# Patient Record
Sex: Male | Born: 1945 | Race: White | Hispanic: No | Marital: Married | Smoking: Never smoker
Health system: Southern US, Community
[De-identification: ages and names within clinical notes are randomized; demographics above are authoritative.]

## PROBLEM LIST (undated history)

## (undated) DIAGNOSIS — M25569 Pain in unspecified knee: Secondary | ICD-10-CM

## (undated) DIAGNOSIS — K635 Polyp of colon: Secondary | ICD-10-CM

## (undated) DIAGNOSIS — C801 Malignant (primary) neoplasm, unspecified: Secondary | ICD-10-CM

## (undated) DIAGNOSIS — K589 Irritable bowel syndrome without diarrhea: Secondary | ICD-10-CM

## (undated) DIAGNOSIS — I4891 Unspecified atrial fibrillation: Secondary | ICD-10-CM

## (undated) DIAGNOSIS — I4510 Unspecified right bundle-branch block: Secondary | ICD-10-CM

## (undated) DIAGNOSIS — I2510 Atherosclerotic heart disease of native coronary artery without angina pectoris: Secondary | ICD-10-CM

## (undated) DIAGNOSIS — G4730 Sleep apnea, unspecified: Secondary | ICD-10-CM

## (undated) DIAGNOSIS — R55 Syncope and collapse: Secondary | ICD-10-CM

## (undated) DIAGNOSIS — Z7901 Long term (current) use of anticoagulants: Secondary | ICD-10-CM

## (undated) DIAGNOSIS — R002 Palpitations: Secondary | ICD-10-CM

## (undated) DIAGNOSIS — E785 Hyperlipidemia, unspecified: Secondary | ICD-10-CM

## (undated) DIAGNOSIS — Z86010 Personal history of colonic polyps: Secondary | ICD-10-CM

## (undated) DIAGNOSIS — R0989 Other specified symptoms and signs involving the circulatory and respiratory systems: Secondary | ICD-10-CM

## (undated) DIAGNOSIS — IMO0002 Reserved for concepts with insufficient information to code with codable children: Secondary | ICD-10-CM

## (undated) DIAGNOSIS — Z789 Other specified health status: Secondary | ICD-10-CM

## (undated) DIAGNOSIS — R9430 Abnormal result of cardiovascular function study, unspecified: Secondary | ICD-10-CM

## (undated) DIAGNOSIS — N400 Enlarged prostate without lower urinary tract symptoms: Secondary | ICD-10-CM

## (undated) DIAGNOSIS — U071 COVID-19: Secondary | ICD-10-CM

## (undated) DIAGNOSIS — F419 Anxiety disorder, unspecified: Secondary | ICD-10-CM

## (undated) HISTORY — DX: Other specified health status: Z78.9

## (undated) HISTORY — DX: Palpitations: R00.2

## (undated) HISTORY — DX: Unspecified right bundle-branch block: I45.10

## (undated) HISTORY — DX: Abnormal result of cardiovascular function study, unspecified: R94.30

## (undated) HISTORY — PX: CARDIAC CATHETERIZATION: SHX172

## (undated) HISTORY — DX: Benign prostatic hyperplasia without lower urinary tract symptoms: N40.0

## (undated) HISTORY — DX: Hyperlipidemia, unspecified: E78.5

## (undated) HISTORY — DX: Anxiety disorder, unspecified: F41.9

## (undated) HISTORY — DX: Irritable bowel syndrome, unspecified: K58.9

## (undated) HISTORY — DX: Pain in unspecified knee: M25.569

## (undated) HISTORY — DX: Other specified symptoms and signs involving the circulatory and respiratory systems: R09.89

## (undated) HISTORY — PX: COLONOSCOPY: SHX174

## (undated) HISTORY — DX: Syncope and collapse: R55

## (undated) HISTORY — PX: PROSTATE SURGERY: SHX751

## (undated) HISTORY — DX: COVID-19: U07.1

## (undated) HISTORY — DX: Unspecified atrial fibrillation: I48.91

## (undated) HISTORY — DX: Reserved for concepts with insufficient information to code with codable children: IMO0002

## (undated) HISTORY — DX: Personal history of colonic polyps: Z86.010

## (undated) HISTORY — DX: Atherosclerotic heart disease of native coronary artery without angina pectoris: I25.10

## (undated) HISTORY — DX: Polyp of colon: K63.5

---

## 1990-05-12 HISTORY — PX: BACK SURGERY: SHX140

## 2000-10-25 ENCOUNTER — Inpatient Hospital Stay (HOSPITAL_COMMUNITY): Admission: AD | Admit: 2000-10-25 | Discharge: 2000-10-26 | Payer: Self-pay | Admitting: Cardiology

## 2001-10-02 ENCOUNTER — Observation Stay (HOSPITAL_COMMUNITY): Admission: EM | Admit: 2001-10-02 | Discharge: 2001-10-03 | Payer: Self-pay

## 2002-01-13 ENCOUNTER — Inpatient Hospital Stay (HOSPITAL_COMMUNITY): Admission: EM | Admit: 2002-01-13 | Discharge: 2002-01-15 | Payer: Self-pay | Admitting: *Deleted

## 2002-01-13 ENCOUNTER — Encounter: Payer: Self-pay | Admitting: *Deleted

## 2003-03-29 ENCOUNTER — Encounter: Payer: Self-pay | Admitting: Emergency Medicine

## 2003-03-29 ENCOUNTER — Emergency Department (HOSPITAL_COMMUNITY): Admission: EM | Admit: 2003-03-29 | Discharge: 2003-03-29 | Payer: Self-pay | Admitting: Emergency Medicine

## 2003-11-03 ENCOUNTER — Encounter: Admission: RE | Admit: 2003-11-03 | Discharge: 2003-12-04 | Payer: Self-pay | Admitting: Family Medicine

## 2004-04-14 ENCOUNTER — Ambulatory Visit: Payer: Self-pay

## 2004-04-19 ENCOUNTER — Ambulatory Visit: Payer: Self-pay | Admitting: Cardiology

## 2004-04-29 ENCOUNTER — Ambulatory Visit: Payer: Self-pay | Admitting: Cardiology

## 2004-07-14 ENCOUNTER — Ambulatory Visit: Payer: Self-pay | Admitting: Cardiology

## 2004-07-25 ENCOUNTER — Ambulatory Visit (HOSPITAL_COMMUNITY): Admission: RE | Admit: 2004-07-25 | Discharge: 2004-07-25 | Payer: Self-pay | Admitting: Gastroenterology

## 2004-07-25 ENCOUNTER — Encounter (INDEPENDENT_AMBULATORY_CARE_PROVIDER_SITE_OTHER): Payer: Self-pay | Admitting: *Deleted

## 2004-08-01 ENCOUNTER — Ambulatory Visit: Payer: Self-pay | Admitting: *Deleted

## 2004-08-26 ENCOUNTER — Ambulatory Visit: Payer: Self-pay | Admitting: Cardiology

## 2004-10-07 ENCOUNTER — Ambulatory Visit: Payer: Self-pay | Admitting: Internal Medicine

## 2004-10-28 ENCOUNTER — Ambulatory Visit: Payer: Self-pay | Admitting: Cardiovascular Disease

## 2004-11-09 ENCOUNTER — Ambulatory Visit: Payer: Self-pay | Admitting: Cardiology

## 2004-11-24 ENCOUNTER — Ambulatory Visit: Payer: Self-pay

## 2004-12-23 ENCOUNTER — Ambulatory Visit: Payer: Self-pay | Admitting: Cardiology

## 2005-01-31 ENCOUNTER — Ambulatory Visit: Payer: Self-pay | Admitting: Cardiology

## 2005-05-26 ENCOUNTER — Ambulatory Visit: Payer: Self-pay | Admitting: Cardiology

## 2005-09-03 ENCOUNTER — Ambulatory Visit: Payer: Self-pay | Admitting: Cardiovascular Disease

## 2005-09-04 ENCOUNTER — Encounter: Payer: Self-pay | Admitting: Cardiology

## 2005-09-04 ENCOUNTER — Observation Stay (HOSPITAL_COMMUNITY): Admission: EM | Admit: 2005-09-04 | Discharge: 2005-09-05 | Payer: Self-pay | Admitting: Emergency Medicine

## 2005-09-07 ENCOUNTER — Ambulatory Visit: Payer: Self-pay

## 2005-10-11 ENCOUNTER — Ambulatory Visit: Payer: Self-pay | Admitting: Cardiology

## 2006-09-03 ENCOUNTER — Ambulatory Visit: Payer: Self-pay | Admitting: Cardiology

## 2006-10-29 ENCOUNTER — Ambulatory Visit: Payer: Self-pay | Admitting: Internal Medicine

## 2006-11-29 ENCOUNTER — Encounter: Payer: Self-pay | Admitting: Internal Medicine

## 2006-11-29 ENCOUNTER — Ambulatory Visit: Payer: Self-pay | Admitting: Internal Medicine

## 2006-11-29 DIAGNOSIS — Z860100 Personal history of colon polyps, unspecified: Secondary | ICD-10-CM | POA: Insufficient documentation

## 2006-11-29 HISTORY — DX: Personal history of colon polyps, unspecified: Z86.0100

## 2006-11-29 HISTORY — DX: Personal history of colonic polyps: Z86.010

## 2007-04-30 ENCOUNTER — Ambulatory Visit: Payer: Self-pay | Admitting: Cardiology

## 2007-10-28 ENCOUNTER — Emergency Department (HOSPITAL_COMMUNITY): Admission: EM | Admit: 2007-10-28 | Discharge: 2007-10-29 | Payer: Self-pay | Admitting: Emergency Medicine

## 2007-11-29 ENCOUNTER — Ambulatory Visit: Payer: Self-pay | Admitting: Cardiology

## 2007-12-04 ENCOUNTER — Ambulatory Visit: Payer: Self-pay

## 2008-11-13 ENCOUNTER — Encounter: Payer: Self-pay | Admitting: Cardiology

## 2008-12-09 ENCOUNTER — Encounter: Payer: Self-pay | Admitting: Cardiology

## 2009-02-06 DIAGNOSIS — N400 Enlarged prostate without lower urinary tract symptoms: Secondary | ICD-10-CM

## 2009-05-24 ENCOUNTER — Encounter: Payer: Self-pay | Admitting: Cardiology

## 2009-05-25 ENCOUNTER — Ambulatory Visit: Payer: Self-pay | Admitting: Cardiology

## 2009-05-31 ENCOUNTER — Encounter (INDEPENDENT_AMBULATORY_CARE_PROVIDER_SITE_OTHER): Payer: Self-pay | Admitting: *Deleted

## 2009-09-20 ENCOUNTER — Encounter (INDEPENDENT_AMBULATORY_CARE_PROVIDER_SITE_OTHER): Payer: Self-pay | Admitting: *Deleted

## 2009-11-02 ENCOUNTER — Encounter: Payer: Self-pay | Admitting: Cardiology

## 2010-03-23 ENCOUNTER — Ambulatory Visit: Payer: Self-pay | Admitting: Cardiology

## 2010-04-04 ENCOUNTER — Ambulatory Visit: Payer: Self-pay | Admitting: Cardiovascular Disease

## 2010-04-04 ENCOUNTER — Observation Stay (HOSPITAL_COMMUNITY): Admission: EM | Admit: 2010-04-04 | Discharge: 2010-04-05 | Payer: Self-pay | Admitting: Emergency Medicine

## 2010-04-05 ENCOUNTER — Encounter: Payer: Self-pay | Admitting: Cardiology

## 2010-04-18 ENCOUNTER — Ambulatory Visit: Payer: Self-pay

## 2010-04-18 ENCOUNTER — Encounter: Payer: Self-pay | Admitting: Cardiology

## 2010-04-26 ENCOUNTER — Encounter: Payer: Self-pay | Admitting: Cardiology

## 2010-04-27 ENCOUNTER — Ambulatory Visit: Payer: Self-pay | Admitting: Cardiology

## 2010-07-14 NOTE — Letter (Signed)
Summary: Appointment - Reminder 2  [employer], Main Office  [address]   [place], [place] [address]   Phone: [phone]  Fax: [phone]     September 20, 2009 MRN: [id number]   Jordan [name] [address] [place], [place]  [address]   Dear [name],  Our records indicate that it is time to schedule a follow-up appointment with Dr. Myrtis Ser. It is very important that we reach you to schedule this appointment. We look forward to participating in your health care needs. Please contact us at the number listed above at your earliest convenience to schedule your appointment.  If you are unable to make an appointment at this time, give [place] a call so we can update our records.     Sincerely,    Migdalia Dk [facility] Scheduling Team

## 2010-07-14 NOTE — Letter (Signed)
Summary: [facility]   [facility]   Imported By: Roderic Ovens 04/28/2010 12:19:03  _____________________________________________________________________  External Attachment:    Type:   Image     Comment:   External Document

## 2010-07-14 NOTE — Assessment & Plan Note (Signed)
Summary: dizziness/over due follow up/mt  Medications Added GLUCOSAMINE-CHONDROITIN   CAPS (GLUCOSAMINE-CHONDROIT-VIT C-MN) once daily DOXYCYCLINE HYCLATE 100 MG CAPS (DOXYCYCLINE HYCLATE) two times a day      Allergies Added:   Visit Type:  Follow-up Primary Provider:  Vernon Prey, MD  CC:  CAD.  History of Present Illness: The patient is seen for followup of coronary artery disease and atrial fibrillation.  I saw him last December, 2010.  He is doing very well.  Continues to have rare episodes of atrial fibrillation that can last for several hours.  These are infrequent.  Usually they occur if he is tired.  Sometimes they can occur at nighttime.  He does not appear to have classic sleep apnea.  He had a syncopal episode and we discussed at the time of his last visit.  It appeared to have been vasovagal related to some events at that time.  He is going about full activities.  Current Medications (verified): 1)  Diltiazem Hcl Cr 180 Mg Xr24h-Cap (Diltiazem Hcl) .Marland Kitchen.. 1 By Mouth Daily 2)  Proscar 5 Mg Tabs (Finasteride) .... Once Daily 3)  Niaspan 750 Mg Cr-Tabs (Niacin (Antihyperlipidemic)) .... 2 Tabs At Bedtime 4)  Lipitor 40 Mg Tabs (Atorvastatin Calcium) .... Take One Tablet By Mouth Daily. 5)  Warfarin Sodium 5 Mg Tabs (Warfarin Sodium) .... Use As Directed By Anticoagulation Clinic 6)  Multivitamins   Tabs (Multiple Vitamin) .... Once Daily 7)  Vitamin D 1000 Unit  Tabs (Cholecalciferol) .... Once Daily 8)  Aspirin 81 Mg Tbec (Aspirin) .... Take One Tablet By Mouth Daily 9)  Nitrostat 0.4 Mg Subl (Nitroglycerin) .Marland Kitchen.. 1 Tablet Under Tongue At Onset of Chest Pain; You May Repeat Every 5 Minutes For Up To 3 Doses. 10)  Diltiazem Hcl 60 Mg Tabs (Diltiazem Hcl) .Marland Kitchen.. 1 Tab As Directed/ As Needed 11)  Glucosamine-Chondroitin   Caps (Glucosamine-Chondroit-Vit C-Mn) .... Once Daily 12)  Doxycycline Hyclate 100 Mg Caps (Doxycycline Hyclate) .... Two Times A Day  Allergies (verified): 1)  !  Sulfa 2)  ! Levaquin  Past History:  Past Medical History: SUPRAVENTRICULAR TACHYCARDIA, HX OF (ICD-V12.59) PALPITATIONS, HX OF (ICD-V12.50) COLONIC POLYPS, HX OF (ICD-V12.72) HYPERLIPIDEMIA (ICD-272.4) ANXIETY, MILD (ICD-300.00) BACK PAIN (ICD-724.5) BENIGN PROSTATIC HYPERTROPHY, HX OF (ICD-V13.8) CAD... stent  mid LAD... 2002  /   nuclear.. June, 2009... no ischemia EF 65-70%... echo.... 2007 PAROXYSMAL ATRIAL FIBRILLATION (ICD-427.31) Coumadin therapy.... despite low CHADS2 score.., patient wants coumadin Beta blocker... mild intolerance IRBBB Syncope.... November, 2010 Question soft right carotid bruit..... October, 2011    Review of Systems       Patient denies fever, chills, headache, sweats, rash, change in vision, change in hearing, chest pain, cough, nausea vomiting, urinary symptoms.  All other systems are reviewed and are negative.  Vital Signs:  Patient profile:   65 year old male Height:      75 inches Weight:      211 pounds BMI:     26.47 Pulse rate:   65 / minute BP sitting:   106 / 68  (left arm) Cuff size:   regular  Vitals Entered By: Hardin Negus, RMA (March 23, 2010 12:15 PM)  Physical Exam  General:  patient is stable today. Head:  head is atraumatic. Eyes:  no xanthelasma. Neck:  noted jugular venous distention.  There is question of a soft right carotid bruit. Chest Wall:  no chest wall tenderness. Lungs:  lungs are clear.  Respiratory effort is nonlabored. Heart:  cardiac exam  reveals an S1-S2.  There are no clicks or significant murmurs. Abdomen:  abdomen is soft. Msk:  no musculoskeletal deformities. Extremities:  no peripheral edema. Skin:  patient has a spider bite on his left elbow.  This is being treated. Psych:  Patient is oriented to person time and place.  Affect is normal.   Impression & Recommendations:  Problem # 1:  * QUESTION CAROTID BRUIT There is questionable bruit.  The patient has known vascular disease.  I  reviewed the records carefully and I do not find a carotid Doppler in the past.  This will be arranged.  I'll be in touch with you with information.  Problem # 2:  SYNCOPE (ICD-780.2)  His updated medication list for this problem includes:    Diltiazem Hcl Cr 180 Mg Xr24h-cap (Diltiazem hcl) .Marland Kitchen... 1 by mouth daily    Warfarin Sodium 5 Mg Tabs (Warfarin sodium) ..... Use as directed by anticoagulation clinic    Aspirin 81 Mg Tbec (Aspirin) .Marland Kitchen... Take one tablet by mouth daily    Nitrostat 0.4 Mg Subl (Nitroglycerin) .Marland Kitchen... 1 tablet under tongue at onset of chest pain; you may repeat every 5 minutes for up to 3 doses.    Diltiazem Hcl 60 Mg Tabs (Diltiazem hcl) .Marland Kitchen... 1 tab as directed/ as needed There's been no recurrent syncope.  No further workup needed.  Problem # 3:  COUMADIN THERAPY (ICD-V58.61) The patient continues on Coumadin.  No change in therapy.  Problem # 4:  HYPERLIPIDEMIA (ICD-272.4)  His updated medication list for this problem includes:    Niaspan 750 Mg Cr-tabs (Niacin (antihyperlipidemic)) .Marland Kitchen... 2 tabs at bedtime    Lipitor 40 Mg Tabs (Atorvastatin calcium) .Marland Kitchen... Take one tablet by mouth daily. The patient's lipids are very well treated.  He brought me a copy of his last bloods.  He has excellent control.  Problem # 5:  CORONARY ARTERY DISEASE (ICD-414.00)  His updated medication list for this problem includes:    Diltiazem Hcl Cr 180 Mg Xr24h-cap (Diltiazem hcl) .Marland Kitchen... 1 by mouth daily    Warfarin Sodium 5 Mg Tabs (Warfarin sodium) ..... Use as directed by anticoagulation clinic    Aspirin 81 Mg Tbec (Aspirin) .Marland Kitchen... Take one tablet by mouth daily    Nitrostat 0.4 Mg Subl (Nitroglycerin) .Marland Kitchen... 1 tablet under tongue at onset of chest pain; you may repeat every 5 minutes for up to 3 doses.    Diltiazem Hcl 60 Mg Tabs (Diltiazem hcl) .Marland Kitchen... 1 tab as directed/ as needed Coronary disease is stable.  He had a nuclear stress study in 2009 with no ischemia.  Is not having  symptoms.  No further workup at this time.  Problem # 6:  PAROXYSMAL ATRIAL FIBRILLATION (ICD-427.31)  His updated medication list for this problem includes:    Warfarin Sodium 5 Mg Tabs (Warfarin sodium) ..... Use as directed by anticoagulation clinic    Aspirin 81 Mg Tbec (Aspirin) .Marland Kitchen... Take one tablet by mouth daily The patient continues to have frequent short bursts of atrial fibrillation that are symptomatic.  He takes extra short-acting diltiazem when he has one of these episodes.  This seems to help him.  The frequency is not changing.  By his choice he is on Coumadin.  No further workup at this time.  Other Orders: Carotid Duplex (Carotid Duplex)  Patient Instructions: 1)  Your physician wants you to follow-up in: 6 months.   You will receive a reminder letter in the mail two months in advance.  If you don't receive a letter, please call our office to schedule the follow-up appointment. 2)  Your physician has requested that you have a carotid duplex. This test is an ultrasound of the carotid arteries in your neck. It looks at blood flow through these arteries that supply the brain with blood. Allow one hour for this exam. There are no restrictions or special instructions. Prescriptions: DILTIAZEM HCL 60 MG TABS (DILTIAZEM HCL) 1 tab as directed/ as needed  #15 x 6   Entered by:   Meredith Staggers, RN   Authorized by:   Talitha Givens, MD, [facility]   Signed by:   Meredith Staggers, RN on 03/23/2010   Method used:   Faxed to ...       Hospital doctor (retail)       [address]       [place], [place]  [address]       Ph: [phone] or [phone]       Fax: [phone]   RxID:   [id number] NITROSTAT 0.4 MG SUBL (NITROGLYCERIN) 1 tablet under tongue at onset of chest pain; you may repeat every 5 minutes for up to 3 doses.  #25 x 11   Entered by:   Meredith Staggers, RN   Authorized by:   Talitha Givens, MD, [facility]   Signed by:   Meredith Staggers, RN on  03/23/2010   Method used:   Faxed to ...       Hospital doctor (retail)       [address]       [place], [place]  [address]       Ph: [phone] or [phone]       Fax: [phone]   RxID:   [id number] DILTIAZEM HCL CR 180 MG XR24H-CAP (DILTIAZEM HCL) 1 by mouth daily  #30 x 11   Entered by:   Meredith Staggers, RN   Authorized by:   Talitha Givens, MD, [facility]   Signed by:   Meredith Staggers, RN on 03/23/2010   Method used:   Faxed to ...       Hospital doctor (retail)       [address]       [place], [place]  [address]       Ph: [phone] or [phone]       Fax: [phone]   RxID:   [id number]

## 2010-07-14 NOTE — Miscellaneous (Signed)
  Clinical Lists Changes  Observations: Added new observation of PAST MED HX: SUPRAVENTRICULAR TACHYCARDIA, HX OF (ICD-V12.59) PALPITATIONS, HX OF (ICD-V12.50) COLONIC POLYPS, HX OF (ICD-V12.72) HYPERLIPIDEMIA (ICD-272.4) ANXIETY, MILD (ICD-300.00) BACK PAIN (ICD-724.5) BENIGN PROSTATIC HYPERTROPHY, HX OF (ICD-V13.8) CAD... stent  mid LAD... 2002  /   nuclear.. June, 2009... no ischemia  /   hospital 04/04/2010 nuclear...70%, no ischemia, possible slight apical scar, EF 65-70%... echo.... 2007 PAROXYSMAL ATRIAL FIBRILLATION (ICD-427.31) Coumadin therapy.... despite low CHADS2 score.., patient wants coumadin Beta blocker... mild intolerance IRBBB Syncope.... November, 2010 Question soft right carotid bruit..... October, 2011  /  doppler..04/18/2010  Normal   (04/26/2010 16:37) Added new observation of PRIMARY MD: Vernon Prey, MD (04/26/2010 16:37)       Past History:  Past Medical History: SUPRAVENTRICULAR TACHYCARDIA, HX OF (ICD-V12.59) PALPITATIONS, HX OF (ICD-V12.50) COLONIC POLYPS, HX OF (ICD-V12.72) HYPERLIPIDEMIA (ICD-272.4) ANXIETY, MILD (ICD-300.00) BACK PAIN (ICD-724.5) BENIGN PROSTATIC HYPERTROPHY, HX OF (ICD-V13.8) CAD... stent  mid LAD... 2002  /   nuclear.. June, 2009... no ischemia  /   hospital 04/04/2010 nuclear...70%, no ischemia, possible slight apical scar, EF 65-70%... echo.... 2007 PAROXYSMAL ATRIAL FIBRILLATION (ICD-427.31) Coumadin therapy.... despite low CHADS2 score.., patient wants coumadin Beta blocker... mild intolerance IRBBB Syncope.... November, 2010 Question soft right carotid bruit..... October, 2011  /  doppler..04/18/2010  Normal

## 2010-07-14 NOTE — Assessment & Plan Note (Signed)
Summary: eph/jml      Allergies Added:   Visit Type:  post hospital visit Primary Coren Sagan:  Vernon Prey, MD  CC:  CAD.  History of Present Illness: The patient is seen for followup of atrial fibrillation, coronary artery disease, and this is a post hospitalization visit.  Recently on Monday morning the patient felt many of his stresses including work, concerned about his wife, concerned about his son.  He had some mild chest discomfort and as the morning went on he became concerned.  Ultimately he was hospitalized.  Cardiac enzymes were negative.  He had Alexis in my view that showed no ischemia.  He has been active since then and feels well.  The patient mentions is a new problem that he has some discomfort in his knee.  [place] the beach.  As part of the evaluation today I have reviewed the hospital H&P and discharge summary and the labs and nuclear report  Current Medications (verified): 1)  Diltiazem Hcl Cr 180 Mg Xr24h-Cap (Diltiazem Hcl) .Marland Kitchen.. 1 By Mouth Daily 2)  Proscar 5 Mg Tabs (Finasteride) .... Once Daily 3)  Niaspan 750 Mg Cr-Tabs (Niacin (Antihyperlipidemic)) .... 2 Tabs At Bedtime 4)  Lipitor 40 Mg Tabs (Atorvastatin Calcium) .... Take One Tablet By Mouth Daily. 5)  Warfarin Sodium 5 Mg Tabs (Warfarin Sodium) .... Use As Directed By Anticoagulation Clinic 6)  Multivitamins   Tabs (Multiple Vitamin) .... Once Daily 7)  Vitamin D 1000 Unit  Tabs (Cholecalciferol) .... Once Daily 8)  Aspirin 81 Mg Tbec (Aspirin) .... Take One Tablet By Mouth Daily 9)  Nitrostat 0.4 Mg Subl (Nitroglycerin) .Marland Kitchen.. 1 Tablet Under Tongue At Onset of Chest Pain; You May Repeat Every 5 Minutes For Up To 3 Doses. 10)  Diltiazem Hcl 60 Mg Tabs (Diltiazem Hcl) .Marland Kitchen.. 1 Tab As Directed/ As Needed 11)  Glucosamine-Chondroitin   Caps (Glucosamine-Chondroit-Vit C-Mn) .... Once Daily  Allergies (verified): 1)  ! Sulfa 2)  ! Levaquin  Past History:  Past Medical History: SUPRAVENTRICULAR  TACHYCARDIA, HX OF (ICD-V12.59) PALPITATIONS, HX OF (ICD-V12.50) COLONIC POLYPS, HX OF (ICD-V12.72) HYPERLIPIDEMIA (ICD-272.4) ANXIETY, MILD (ICD-300.00) BACK PAIN (ICD-724.5) BENIGN PROSTATIC HYPERTROPHY, HX OF (ICD-V13.8) CAD... stent  mid LAD... 2002  /   nuclear.. June, 2009... no ischemia  /   hospital 04/04/2010 nuclear...70%, no ischemia, possible slight apical scar, EF 65-70%... echo.... 2007 PAROXYSMAL ATRIAL FIBRILLATION (ICD-427.31) Coumadin therapy.... despite low CHADS2 score.., patient wants coumadin Beta blocker... mild intolerance IRBBB Syncope.... November, 2010 Question soft right carotid bruit..... October, 2011  /  doppler..04/18/2010  Normal.. Knee discomfort   in November, 2011    Review of Systems       Patient denies fever, chills, headache, sweats, rash, change in vision, change in hearing, chest pain, cough, nausea vomiting, urinary symptoms.  All other systems are reviewed and are negative.  Vital Signs:  Patient profile:   65 year old male Height:      75 inches Weight:      210.50 pounds BMI:     26.41 Pulse rate:   62 / minute BP sitting:   110 / 68  (left arm) Cuff size:   regular  Vitals Entered By: Caralee Ates CMA (April 27, 2010 11:59 AM)  Physical Exam  General:  he is stable today. Head:  head is atraumatic. Eyes:  no xanthelasma. Neck:  no jugular venous distention. Chest Wall:  no chest wall tenderness. Lungs:  lungs are clear.  Respiratory effort  is nonlabored. Heart:  neck exam reveals an S1-S2.  No clicks or significant murmurs. Abdomen:  abdomen is soft. Msk:  no musculoskeletal deformities. Extremities:  no peripheral edema. Skin:  no skin rashes. Psych:  patient is oriented to person time and place.  Affect is normal.   Impression & Recommendations:  Problem # 1:  * KNEE DISCOMFORT Patient mentions his knee discomfort.  I will refer him to Dr. Francena Hanly  Problem # 2:  * QUESTION CAROTID BRUIT Patient had carotid  Doppler.  This was done on April 18, 2010. it was normal.  Problem # 3:  PALPITATIONS, HX OF (ICD-V12.50) The patient has not had any significant palpitations.  No change in therapy.  Problem # 4:  CORONARY ARTERY DISEASE (ICD-414.00)  His updated medication list for this problem includes:    Diltiazem Hcl Cr 180 Mg Xr24h-cap (Diltiazem hcl) .Marland Kitchen... 1 by mouth daily    Warfarin Sodium 5 Mg Tabs (Warfarin sodium) ..... Use as directed by anticoagulation clinic    Aspirin 81 Mg Tbec (Aspirin) .Marland Kitchen... Take one tablet by mouth daily    Nitrostat 0.4 Mg Subl (Nitroglycerin) .Marland Kitchen... 1 tablet under tongue at onset of chest pain; you may repeat every 5 minutes for up to 3 doses.    Diltiazem Hcl 60 Mg Tabs (Diltiazem hcl) .Marland Kitchen... 1 tab as directed/ as needed With the patient's recent symptoms he was hospitalized.  There was no MI.  Nuclear scan revealed no ischemia.  EKG is done today and reviewed by me.  There is no significant change.  I feel he does not need any further workup at this time.  Problem # 5:  PAROXYSMAL ATRIAL FIBRILLATION (ICD-427.31)  His updated medication list for this problem includes:    Warfarin Sodium 5 Mg Tabs (Warfarin sodium) ..... Use as directed by anticoagulation clinic    Aspirin 81 Mg Tbec (Aspirin) .Marland Kitchen... Take one tablet by mouth daily He has not had any recurrent atrial fibrillation.  Will see him in 6 months for followup.  Orders: EKG w/ Interpretation (93000)  Patient Instructions: 1)  Dr Myrtis Ser recommends Dr Francena Hanly at [facility] for your knee discomfort. 2)  Your physician wants you to follow-up in: 6 months.  You will receive a reminder letter in the mail two months in advance. If you don't receive a letter, please call our office to schedule the follow-up appointment.

## 2010-08-24 LAB — CARDIAC PANEL(CRET KIN+CKTOT+MB+TROPI)
CK, MB: 1.1 ng/mL (ref 0.3–4.0)
Relative Index: 0.9 (ref 0.0–2.5)
Relative Index: 1.1 (ref 0.0–2.5)
Troponin I: 0.01 ng/mL (ref 0.00–0.06)

## 2010-08-24 LAB — COMPREHENSIVE METABOLIC PANEL
ALT: 26 U/L (ref 0–53)
AST: 28 U/L (ref 0–37)
Calcium: 8.6 mg/dL (ref 8.4–10.5)
Chloride: 107 mEq/L (ref 96–112)
GFR calc non Af Amer: >60 mL/min (ref >60)
Glucose, Bld: 106 mg/dL — ABNORMAL HIGH (ref 70–99)
Sodium: 140 mEq/L (ref 135–145)
Total Bilirubin: 0.9 mg/dL (ref 0.3–1.2)

## 2010-08-24 LAB — CK TOTAL AND CKMB (NOT AT ARMC)
CK, MB: 2 ng/mL (ref 0.3–4.0)
Relative Index: 1.1 (ref 0.0–2.5)

## 2010-08-24 LAB — CBC
HCT: 42.5 % (ref 39.0–52.0)
Hemoglobin: 14.4 g/dL (ref 13.0–17.0)
MCHC: 33.9 g/dL (ref 30.0–36.0)
RBC: 4.71 MIL/uL (ref 4.22–5.81)
WBC: 7.9 10*3/uL (ref 4.0–10.5)

## 2010-10-10 ENCOUNTER — Encounter: Payer: Self-pay | Admitting: Cardiology

## 2010-10-25 NOTE — Assessment & Plan Note (Signed)
[facility]                            CARDIOLOGY OFFICE [name]   [name], [name]                      MRN:          [id number]  DATE:11/29/2007                            DOB:          1945/07/04    Mr. Jordan [name] is doing well.  He has had his Lipitor dose adjusted through  Dr. Kathi Der office.  I have reviewed his lipoma profile with him.  I am  completely in agreement.  His LDL is now down to 63.  His HDL is up.  He  is stable.  He is not having any chest pain.  It has been of long period  of time since his last exercise test and we will arrange for this now.  He has not had any syncope or presyncope.  He had a spontaneous bleed  into his left lower foot.  This may have been related to some orthotic  that he was trying.  This was treated and stabilized.  He and I had a  very careful discussion once again about Coumadin.  His [place] score is  low.  By current criteria, we could consider stopping his Coumadin.  However, I have discussed with him that we have seen the patients who  have had strokes related atrial fibrillation with low [place] scores off  Coumadin.  He understands fully and wants to remain on Coumadin.   ALLERGIES:  SULFA and LEVAQUIN.   MEDICATIONS:  Coumadin, Proscar, aspirin, Niaspan 1500 mg, diltiazem  180, fish oil, multivitamin, calcium, and Lipitor 40.   OTHER MEDICAL PROBLEMS:  See the list below.   REVIEW OF SYSTEMS:  He mentioned that he may have some sleep  abnormalities.  However, he feels it is mild.  We talked about the  importance of treating severe sleep apnea if it is present.  It does not  sound like this is the problem for him.  In addition, I do not believe  this is the basis of his atrial fibrillation.  Otherwise his review of  systems is negative.   PHYSICAL EXAM:  VITAL SIGNS:  Weight is 209 pounds.  It would be good  for him to lose some weight.  Blood pressure is 111/76 with a pulse of  76.  GENERAL:  The  patient is oriented to person, time, and place.  Affect is  normal.  HEENT:  Reveals no xanthelasma.  He has normal extraocular motion.  NECK:  There are no carotid bruits.  There is no jugular venous  distention.  LUNGS:  Clear.  Respiratory effort is not labored.  CARDIAC EXAM:  Reveals S1 and S2.  There are no clicks or significant  murmurs.  ABDOMEN:  Soft.  He has no significant peripheral edema.   EKG is normal with incomplete right bundle-branch block.  A lipoma  values are reviewed as outlined above.   PROBLEMS:  1. Paroxysmal atrial fibrillation.  He is in sinus.  See the      discussion above about Coumadin.  We will continue his Coumadin.  2. Coronary disease.  This has been stable.  Aggressive secondary  prevention is indicated.  It is now time for a stress Myoview.  3. Benign prostatic hypertrophy.  4. Back pain, stable.  5. History of some anxiety over time, but he deals with this very well      and is quite stable in this regard.  6. Normal left ventricular function.  7. Lipid abnormalities being treated.  See the discussion above.  8. Allergy to [facility] and SULFA.  9. Mild beta blocker intolerance.  10.Status post spontaneous venous bleed in his left foot which is now      completely resolved.   We will renew his prescriptions.  He needs a stress Myoview and we will  be in touch with him with the results.  No change his meds.     Luis Abed, MD, [facility]  Electronically Signed    JDK/MedQ  DD: 11/29/2007  DT: 11/29/2007  Job #: [id number]   cc:   Ernestina Penna, M.D.

## 2010-10-25 NOTE — Assessment & Plan Note (Signed)
[facility]                         GASTROENTEROLOGY OFFICE NOTE   Jordan [name], Jordan [name]                      MRN:          [id number]  DATE:10/29/2006                            DOB:          Jul 12, 1945    REASON FOR CONSULTATION:  Change in bowels, rectal bleeding.   ASSESSMENT:  Jordan [name] is a 65 year old, white man who has had  approximately a 72-year-old history of intermittent loose bowel movements  that can be urgent associated with mucus and rectal bleeding.  He has a  personal history of colon polyps removed by Dr. 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   I think this could very well be irritable bowel syndrome with anorectal  bleeding, although there was no hemorrhoid diagnosis made at his  colonoscopy.  More serious causes are certainly possible with recurrent  colon polyps, colon cancer and inflammatory bowel disease.  He is under  some stress at work it sounds like.  He takes Coumadin for atrial  fibrillation and optimally, this should be discontinued for his  colonoscopy.  This adds an extra possible risk of stroke or embolic  event while off of Coumadin, though unlikely that does raise the risk of  the procedure and this is discussed with the patient.  Would continue  him on his aspirin.   PLAN:  Schedule colonoscopy to investigate, possibly remove polyps,  consider random biopsies.  Terminal ileum intubation should be  considered.  Will ask Dr. Myrtis Ser to evaluate the chart regarding holding  his Coumadin x5 days prior to the procedure, but again, will continue  the aspirin.  Risks, benefits and indications of the procedure including  the extra risk as described above were fully described to the patient.  I recommend that he continue Prilosec OTC daily for his heartburn.  Given the duration of symptoms less than a year, I would not perform an  upper endoscopy on the  patient at this time.   HISTORY OF PRESENT ILLNESS:  As above.  See my medical history and  physical form for full details.  This is in the office chart.  There has  been no weight loss.  The bleeding has mainly been on the toilet paper.  There was occasional epigastric discomfort postprandially.  Note, he  tried Prevacid for some heartburn that came on about a year ago.  He was  not sure it helped.  He went on Prilosec OTC x14 days and then another  14-day course and he thinks that is helping.  He eats a lot of fiber and  fruit, milk with his oatmeal, but not a lot of milk otherwise.  He has  not noticed milk intolerance.  He cut out almonds and walnuts and that  has not changed things and he really cannot identify any foods that  bother him.  He likes to go down to [place] and to go out on his boat  and sometimes there are problems when he does that regarding his bowel  movements.  He has not really tried  any symptomatic over-the-counter  agents such as Imodium.   MEDICATIONS:  1. Warfarin as directed.  2. Proscar 5 mg daily.  3. Aspirin 81 mg daily.  4. Niaspan 1500 mg daily (stopping this did not help his symptoms).  5. Diltiazem 180 mg daily.  6. Fish oil 1200 mg daily (he started after symptoms began).  7. Lipitor 10 mg daily.  8. Multivitamin daily.  9. Calcium daily.  10.Diltiazem p.r.n. exacerbation of atrial fibrillation.  11.Nitroglycerin p.r.n.   ALLERGIES:  SULFA AND LEVAQUIN.   PAST MEDICAL HISTORY:  1. Paroxysmal atrial fibrillation followed by Dr. Myrtis Ser.  2. Coronary artery disease with previous angioplasty and stent      followed by Dr. Myrtis Ser.  3. Benign prostatic hypertrophy.  4. Back pain.  5. Remote history of anxiety.  6. Dyslipidemia.  7. Additional allergy to Levaquin noted.  8. Mild beta-blocker intolerance.  9. Colonoscopy with polypectomy noted.  10.Lumbar spine surgery in December 1991, at L4, L5, S1.   FAMILY HISTORY:  There is no colon cancer.   Mother and brother have had  heart disease.  No inflammatory bowel disease.   SOCIAL HISTORY:  He owns a [employer].  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  No alcohol, tobacco or drugs.  He has one son and two  daughters.  He lives with his wife.   REVIEW OF SYSTEMS:  He has had some urinary incontinence at times.  He  has had some blood in the urine reported.  He has back pain.  He has had  some pedal edema and he has the intermittent atrial fibrillation.  All  other systems are negative.   PHYSICAL EXAMINATION:  VITAL SIGNS:  Height 6 feet 2 inches, weight 210  pounds, blood pressure 100/52, pulse 76.  HEENT:  Anicteric.  ENT shows some partial dentures, otherwise free of  lesions and in good repair and without exudate.  NECK:  Supple, no thyromegaly.  CHEST:  Clear.  CARDIAC:  S1, S2, no murmurs, rubs or gallops.  He is in sinus rhythm  today.  ABDOMEN:  Mildly distended, soft and mildly tender diffusely without  organomegaly or mass.  RECTAL:  Deferred.  LYMPHS:  No neck or supraclavicular nodes.  EXTREMITIES:  Free of lower extremity edema.  SKIN:  He has been out in the sun and perhaps had some wind burn with  some diffuse erythema.  PSYCHIATRIC:  He is alert and oriented x3.   I appreciate the opportunity to care for this patient.  I have reviewed  the records in the chart from Dr. Henrietta Hoover recent visit in the spring, the  hospital records in the chart, as well as the records sent from Dr.  Christell Constant with the colonoscopy report and pathology from July 25, 2004.     Iva Boop, MD,FACG  Electronically Signed    CEG/MedQ  DD: 10/29/2006  DT: 10/30/2006  Job #: [id number]   cc:   Ernestina Penna, M.D.

## 2010-10-25 NOTE — Assessment & Plan Note (Signed)
[facility]                            CARDIOLOGY OFFICE NOTE   KENLY, XIAO                      MRN:          [id number]  DATE:04/30/2007                            DOB:          [birth year]    Mr. Nudd is doing very well. He has known coronary disease and he has  been stable. He has paroxysmal atrial fibrillation. He is on Coumadin  and he has been stable with this. If he has a burst of atrial fib, he  takes some extra Cardizem and he does very well.   PAST MEDICAL HISTORY:   ALLERGIES:  SULFA and LEVAQUIN.   MEDICATIONS:  1. Coumadin as directed.  2. Proscar.  3. Aspirin.  4. Niaspan 1500.  5. Diltiazem 180.  6. Fish oil.  7. Lipitor 10.  8. Vitamins.  9. Prilosec p.r.n.   OTHER MEDICAL PROBLEMS:  See the list below.   REVIEW OF SYSTEMS:  He actually is doing well. He has seen Dr. Leone Payor  for GI symptoms. He is not having any other significant problems.   Otherwise his review of systems is negative.   PHYSICAL EXAMINATION:  Weight is 212 pounds. This is decreased by 4  pounds since I saw him last. Blood pressure is 115/80 with a pulse of  64.  The patient is oriented to person, time and place and affect is normal.  HEENT:  Reveals no xanthelasma. He has normal extraocular motion. There  are no carotid bruits. There is no jugular venous distention.  LUNGS:  Clear. Respiratory effort is not labored.  CARDIAC:  Reveals an S1 with an S2. There are no clicks or significant  murmurs.  ABDOMEN:  Soft. He has no significant peripheral edema. There are no  major musculoskeletal deformities.   EKG reveals mild sinus bradycardia and is otherwise normal.   PROBLEM LIST:  1. Paroxysmal atrial fibrillation. He is doing well. He is in sinus      most of the time. He is on Coumadin. He is 65 years of age. I feel      that we should continue his Coumadin very carefully. I have made it      clear to him however that if he were to be  in a situation where he      had increased risk from any type of injury that this would be very      concerning to me with him on Coumadin. Because his [place] score is      relatively low at this point, it would not be unreasonable for him      to hold his Coumadin for a few days if he were to be in any      situation of increased risk of injury.  2. Coronary disease. This is stable. I feel he does not need a      Myoview. However, I will see him back in 6 months and will consider      one at that time.  3. Benign prostatic hypertrophy.  4. Back pain, stable.  5. History of some anxiety and  he has done much better with this over      the years.  6. Normal left ventricular function.  7. Lipid abnormalities that are very nicely treated.  8. History of allergies to Levaquin and sulfa.  9. Mild beta blocker intolerance.   The patient had many many questions and we talked about his care for  greater than 30 minutes. He is stable. I will see him back in 6 months.     Luis Abed, MD, [facility]  Electronically Signed    JDK/MedQ  DD: 04/30/2007  DT: 05/01/2007  Job #: [id number]   cc:   Ernestina Penna, M.D.

## 2010-10-28 NOTE — Discharge Summary (Signed)
[place]  Patient:    Jordan [name], Jordan [name]                        MRN: [id number] Adm. Date:  10/25/00 Disc. Date: 10/26/00 Dictator:   Joellyn Rued, P.A.-C.                  Referring Physician Discharge [place]  DATE OF BIRTH:  June 18, 1945  ADDENDUM TO FOLLOW-UP:  Instead of seeing Dr. Myrtis Ser in [place] he will have a groin check with the P.A. [facility] on Nov 02, 2000 at 11:15 a.m.  Then he will follow up with Dr. Myrtis Ser on November 13, 2000 at 2:45 p.m. in the [place] office.DD:  10/26/00 TD:  10/26/00 Job: [id number]

## 2010-10-28 NOTE — Cardiovascular Report (Signed)
[place]  Patient:    Jordan [name], Jordan [name]                      MRN: [id number] Proc. Date: 10/25/00 Adm. Date:  [id number] Attending:  Rollene Rotunda CC:         Lia Hopping, M.D.  Heart Center, [facility]  Cardiac Catheterization Laboratory   Cardiac Catheterization  PROCEDURES PERFORMED: 1. Left heart catheterization with coronary angiography and left    ventriculography. 2. Percutaneous transluminal coronary angioplasty with stent placement    in the mid left anterior descending artery.  INDICATIONS:  Mr. Kreider is a 65 year old male, admitted [facility] with unstable angina.  Cardiac markers showed a mildly elevated troponin I level at 0.08.  He was referred for cardiac catheterization.  CATHETERIZATION PROCEDURE:  A 6 French sheath was placed in the right femoral artery.  Standard Judkins 6 French catheters were utilized.  Contrast was Omnipaque.  There were no complications.  RESULTS:  HEMODYNAMICS:  Left ventricular pressure 96/12.  Aortic pressure 92/60.  There is no aortic valve gradient.  LEFT VENTRICULOGRAM:  Wall motion is normal.  Ejection fraction is calculated at 69%.  There is trace mitral regurgitation.  CORONARY ARTERIOGRAPHY:  (Right dominant).  Left main:  Left main is normal.  Left anterior descending:  The left anterior descending artery has a 30% stenosis in the proximal vessel, 95% stenosis in the mid vessel followed by a 30% stenosis further down in the mid vessel.  The distal LAD has a 30% stenosis.  The LAD gives rise to a small first diagonal and normal sized second diagonal and small third diagonal.  The second diagonal has a 30% stenosis at its origin.  Left circumflex:  The left circumflex has a 20% stenosis in the distal vessel. It gives rise to a small OM-1, a large OM-2 and a normal sized OM-3.  OM-2 has a 25% stenosis proximally.  Right coronary artery:  The right coronary artery is a dominant  vessel.  There are minor luminal irregularities in the proximal right coronary artery.  The distal right coronary artery has a diffuse 25% stenosis.  The right coronary artery gives rise to a large acute marginal branch, normal sized posterior descending artery and normal first posterolateral branch and a small second posterolateral branch.  IMPRESSIONS: 1. Normal left ventricular systolic function. 2. One-vessel coronary artery disease characterized by 95% stenosis in the    mid left anterior descending artery.  PLAN:  Percutaneous intervention to the LAD.  See below.  PERCUTANEOUS TRANSLUMINAL CORONARY ANGIOPLASTY PROCEDURE:  Following completion of diagnostic catheterization, we opted to proceed with coronary intervention.  The 6 French sheath in the right femoral artery was exchanged over a wire for a 7 [place] sheath.  Heparin and Integrilin were administered per protocol.  We used a 7 [place] guiding catheter and a BMW wire.  The lesion was pre-dilated with a 3.0 x 12 mm Quantum balloon inflated to 8 and then 10 atmospheres.  We then deployed a 3.0 x 13 mm Bx Velocity stent at a deployment pressure of 10 atmospheres.  A second inflation with a stent balloon was performed to 12 atmospheres.  This stent was then post-dilated with a 3.0 x 12 mm Quantum balloon inflated to 16 atmospheres at the distal edge of the stent, 20 atmospheres at the proximal edge of the stent.  Final angiographic images revealed patency to the LAD with 0% residual stenosis and  TIMI-3 flow.  COMPLICATIONS:  None.  RESULTS:  Successful percutaneous transluminal coronary angioplasty with stent placement in the mid left anterior descending artery reducing a 95% stenosis to 0% residual with TIMI-3 flow.  PLAN:  Integrilin will be continued for 18 hours.  Plavix will be administered for four weeks.  The patient needs continued aggressive risk factor modification. DD:  10/25/00 TD:  10/26/00 Job:  [id number]

## 2010-10-28 NOTE — Op Note (Signed)
[place]. [facility]  Patient:    TYHEIM, VANALSTYNE Visit Number: [id number] MRN: [id number]          Service Type: MED Location: [phone] Attending Physician:  Talitha Givens Dictated by:   Pennelope Bracken, N.P. Proc. Date: 10/02/01 Admit Date:  10/02/2001   CC:         Annette Stable A. Olena Leatherwood, M.D.  Monica Becton, M.D.   Operative Report  DATE OF BIRTH:  1946/01/13.  CARDIOLOGIST:  Luis Abed, M.D. PRIMARY PHYSICIANS:  Hasanaj/[facility].  REASON FOR ADMISSION:  New-onset atrial fibrillation.  DISCHARGE DIAGNOSES: 1. Medicine allergies:  SULFA, LEVAQUIN, and NIASPAN. 2. Atrial fibrillation with conversion to sinus rhythm after treatment    with Rythmol and intravenous Cardizem. 3. Coronary artery disease, status post percutaneous coronary intervention    May 2002, status post cardiac catheterization May 2002, with 30% left    anterior descending coronary artery disease, 95% midvessel left anterior    descending disease, and an ejection fraction of 59%, status post    percutaneous transluminal coronary angioplasty and stent to the left    anterior descending. 4. Hyperlipidemia. 5. Benign prostatic hypertrophy prostatitis. 6. Chronic low back pain, status post surgery.  HISTORY OF PRESENT ILLNESS:  This delightful 65 year old gentleman who is very physically active noticed an increase of heart rate up to 140 during his daily exercises.  He monitors his target heart rate while exercising and noticed that he achieved this very quickly.  He had some associated weakness, chest tightness, without any radiation.  He saw Dian Queen, P.A.C., in the [place] office after this, where an EKG was obtained, and this was without ischemic changes.  He continued to feel not right for the next two days. While at work the evening of admission, he developed an increased heart rate again and the same symptoms.  He called EMS.  They found that his  heart rate was ranging from 91-162.  He was treated in the ambulance with O2 and sublingual nitroglycerin.  On arrival to the ED he was started on a Cardizem drip after he was found to be in atrial fibrillation with a heart rate of 153. It was elected to admit him for further evaluation and treatment.  HOSPITAL COURSE:  The patient was admitted and continued on his home medications.  He was started on Rythmol 450 mg p.o. and continued on IV Cardizem.  His cardiac enzymes were negative in three sets, and his TSH was also negative.  He converted to sinus rhythm at some point during the night of April 23 and early morning of April 24.  He experienced no further weakness, chest tightness, or shortness of breath.  After a prolonged discussion with the patient and his wife, it was felt by Dr. Myrtis Ser that he was ready for discharge to home on Cardizem and that it would be prudent to commence Coumadin therapy.  DISCHARGE PHYSICAL EXAMINATION:  VITAL SIGNS:  Blood pressure 100/70, pulse 72.  Telemetry revealed normal sinus.  Temperature 96.7.  Pulse oximetry 98% on 2 L.  GENERAL:  On day of discharge, the patient offered no complaints.  Patient in no acute distress.  NECK:  No JVD.  CHEST:  Lungs clear bilaterally to auscultation.  CARDIAC:  Regular rate and rhythm without murmur, rub, or gallop.  EXTREMITIES:  No clubbing, cyanosis, or edema.  LABORATORY DATA:  Chest x-ray on admission showed no acute disease.  EKG on admission showed atrial  fibrillation with rapid ventricular rate, nonspecific ST-T wave changes.  EKG on day of discharge revealed sinus bradycardia with a rate of 56, with evidence of left atrial enlargement.  A 2 D echo was obtained today at 12:15 and as of time of discharge, this had not yet been read.  Cardiac enzymes negative x3.  CBC at discharge:  WBC 8.2, hemoglobin 15.0, hematocrit 43.0, and platelets 189.  Chemistry on discharge:  Sodium 135, potassium 3.6,  chloride 101, CO2 25, glucose 110, BUN 16, creatinine 1.1. Cardiac enzymes as mentioned above, negative x3.  DISPOSITION:  The patient is discharged to home in the care of his very suppportive wife.  DISCHARGE MEDICATIONS: 1. Cardizem CD 180 mg one q.d. 2. ECASA 325 mg one q.d.  Patient is to reduce this to 81 mg q.d. when he    starts Coumadin. 3. Lipitor 10 mg one q.h.s. 4. Coumadin 7.5 mg x2 days, then 5 mg x2 days, then to Coumadin clinic for    INR.  DISCHARGE INSTRUCTIONS:  Activity:  Patient is advised that he may continue his strenuous exercise after several days of taking care to avoid injury.  The rationale for any actions of Coumadin are thoroughly explained to him.  Diet; Low-cholesterol, low-fat diet.  SPECIAL INSTRUCTIONS:  Follow-up:  The patient will come to the Coumadin clinic for an INR check Monday, April 28, at 10:45, and he will follow up with Dr. Myrtis Ser at the [facility] office on May 14 at 9 a.m.  He knows what to do if he should have this happen again, and he is advised to call the clinic or the hospital in the interim if any problems, questions, concerns, or increase or change in symptoms. Dictated by:   Pennelope Bracken, N.P. Attending Physician:  Talitha Givens DD:  10/03/01 TD:  10/03/01 Job: [id number]

## 2010-10-28 NOTE — H&P (Signed)
NAME:  Jordan [name], Jordan [name] NO.:  [id number]   MEDICAL RECORD NO.:  [id number]                   PATIENT TYPE:  INP   LOCATION:  4729                                 FACILITY:  [facility]   PHYSICIAN:  Salvadore Farber, MD [facility]            DATE OF BIRTH:  12/09/1945   DATE OF ADMISSION:  01/13/2002  DATE OF DISCHARGE:  01/15/2002                                HISTORY & PHYSICAL   PRIMARY CARE DOCTOR:  Ernestina Penna, M.D.   PRIMARY CARDIOLOGIST:  Luis Abed, M.D.   CHIEF COMPLAINT:  Chest pain and palpitations.   HISTORY OF PRESENT ILLNESS:  The patient is a 65 year old gentleman with  coronary artery disease, status post an LAD stent in May of 2002.  He had  modest disease in his right and circumflex territories at that time.  An  exercise Cardiolite study in December of 2002 demonstrated no evidence of  ischemia.  In addition to the coronary disease, the patient has been  troubled by atrial fibrillation for which he is rate controlled with a  calcium channel blocker and is anticoagulated.  He does not sporadic  palpitations with the heart rate occasionally reaching 140 beats per minute.   This morning, the patient suffered the sudden onset of sharp substernal  chest pain associated with palpitations.  His exercise heart rate monitor  demonstrated a heart rate of 140-150 beats per minute.  The pain did not  radiate and was not associated with nausea, diaphoresis, or syncope.  The  patient further denies dyspnea on exertion, paroxysmal nocturnal dyspnea,  orthopnea, edema, and claudication.  The pain lasted a total of about 30  minutes and resolved spontaneously.  He did not take nitroglycerin. He  presented to the emergency room, pain-free, for evaluation.   PAST MEDICAL HISTORY:  1. Atrial fibrillation as above.  2. Coronary artery disease as above (echocardiogram in April of 2003     demonstrated RV function and RV dilatation with an EF of 59%).  3. Benign prostatic hypertrophy.  4. Chronic back pain.  5. Anxiety.   ALLERGIES:  BETA BLOCKER intolerance due to mood disturbance, SULFA,  LEVAQUIN, and NIASPAN.   CURRENT MEDICATIONS:  1. Cardizem CD 180 mg p.o. q.d.  2. Aspirin 81 mg p.o. q.d.  3. Lipitor 10 mg p.o. q.h.s.  4. Coumadin 5 mg alternating with 2.5 mg each day.   SOCIAL HISTORY:  He is married and lives with his wife.  He works as a  Copywriter, advertising.  He does not smoke or drink.   FAMILY HISTORY:  His father died at 63 of an MI.  His mother died at 50 of  old age.   REVIEW OF SYSTEMS:  Negative in detail, except as above and chronic fatigue.   PHYSICAL EXAMINATION:  GENERAL APPEARANCE:  This is a well-appearing, fit  man in no apparent distress.  VITAL SIGNS:  Heart rate 63, blood pressure 127/68, afebrile, and oxygen  saturation 98% on room air.  NECK:  He has no jugular venous distention.  Carotid pulses are 2+ without  bruit.  LYMPHATICS:  There is no cervical, supraclavicular, or axillary  lymphadenopathy.  LUNGS:  Clear to auscultation and percussion.  HEART:  Regular rate and rhythm without murmur, rub, gallop, S3, or S4.  ABDOMEN:  Soft, nondistended, and nontender.  There is no  hepatosplenomegaly.  Bowel sounds are normal.  EXTREMITIES:  Warm and without edema or cyanosis.   LABORATORY DATA:  Potassium 4.1.  Creatinine 1.0.  INR 1.6.  Hematocrit 46.  CK 134, MB 0.7, troponin T 0.01.   The electrocardiogram demonstrates a normal sinus rhythm with an incomplete  right bundle branch block.  There is no change compared with the prior  electrocardiogram.   IMPRESSION/PLAN:  The coincident onset of chest pain and palpitations is  concerning for atrial fibrillation with rapid ventricular response and  consequent myocardial ischemia.  Ischemia-induced ventricular tachycardia  cannot be excluded, but is certainly much less likely.  Both his chest pain  and his palpitations are somewhat more difficult  to assess given his fairly  marked anxiety about these.   Will plan to begin with an evaluation of his ischemic potential with an ETT  Cardiolite tomorrow.  In the meantime, will continue his calcium channel  blocker and aspirin.  Coumadin will be held such that cardiac  catheterization could be performed if indicated.   His probable paroxysmal atrial fibrillation could be treated with either  rate control and continued anticoagulation versus the addition of an  antiarrhythmic drug.  The patient has had some discussion of these options  with his long-term cardiologist, Luis Abed, M.D.  Will therefore defer  any decision as to this until Dr. Myrtis Ser returns tomorrow.  In the meantime,  will continue with his calcium channel blocker for rate control should the  atrial fibrillation recur.  Will follow him with cardiac monitor for the  short term.                                               Salvadore Farber, MD [facility]    WED/MEDQ  D:  01/13/2002  T:  01/17/2002  Job:  [id number]   cc:   Ernestina Penna, M.D.   Luis Abed, M.D. [facility]

## 2010-10-28 NOTE — Discharge Summary (Signed)
NAMEPATRIC, BUCKHALTER NO.:  [id number]   MEDICAL RECORD NO.:  [id number]          PATIENT TYPE:  INP   LOCATION:  2002                         FACILITY:  [facility]   PHYSICIAN:  Willa Rough, M.D.     DATE OF BIRTH:  December 26, 1945   DATE OF ADMISSION:  09/03/2005  DATE OF DISCHARGE:  09/05/2005                                 DISCHARGE SUMMARY   PROCEDURES:  None.   DISCHARGE DIAGNOSIS:  Chest pain, cardiac enzymes negative for myocardial  infarction, status post stress exercise Myoview showing mild mid to apical  anterior and mid to basilar defects that are fixed possibly secondary to  attenuation artifact, no ischemia, and ejection fraction of 63%.   SECONDARY DIAGNOSIS:  1.  Status post echocardiogram this admission with an ejection fraction of      65-70%, grade 2 diastolic dysfunction, trivial mitral regurgitation, and      a mildly dilated right ventricle.  2.  Status post cardiac catheterization with percutaneous transluminal      coronary angiography and Velocity stent to the left anterior descending      in May 2002.  3.  Paroxysmal atrial fibrillation in 2003 converted with Rythmol and      intravenous Cardizem.  4.  Benign prostatic hypertrophy.  5.  Hypertension.  6.  Hyperlipidemia.  7.  Anticoagulation with Coumadin.  8.  History of colon polyps in 2006.  9.  Family history of coronary artery disease in his brother at age 44.  68. Allergy or intolerance to SULFA AND LEVAQUIN.   HOSPITAL COURSE:  Jordan [name] is a 65 year old male with known coronary  artery disease.  He had some episodes of chest pain with typical and  atypical features.  He came to the emergency room  and was admitted for  further evaluation and treatment.  His cardiac enzymes were negative for MI.  A stress test had some defects that were felt consistent with artifact but  no ischemia and a normal EF.  An echocardiogram also showed a normal EF.  A  proton pump inhibitor was added  to his medication regimen.  He had no  further symptoms.  On September 05, 2005, Jordan [name] was ambulating without  chest pain or shortness of breath.  He was maintaining sinus rhythm.  Dr.  Dietrich Pates felt an event recorder was indicated and this is to be performed as  an outpatient.  He was considered stable for discharge with outpatient  follow up arranged.   LABORATORY DATA:  INR at discharge 1.5.  Hemoglobin 13.3, hematocrit 38.8,  WBC 9.4, platelets 157.  Total cholesterol 109, triglycerides 27, HDL 38,  LDL 66.  TSH 3.840.   DISCHARGE INSTRUCTIONS:  His activity level is to be increased gradually.  He is to stick to a diet that is low in fat.  He is to follow up with Dr.  Myrtis Ser on May 2 at 11 a.m. and he is to get a 30 day event monitor and the  office will call.  He is to follow up with Dr. Christell Constant as scheduled.  DISCHARGE MEDICATIONS:  1.  Protonix 40 mg daily.  2.  Diltiazem 180 mg daily.  3.  Niaspan 1500 mg daily.  4.  Coumadin 2.5 mg daily.  5.  Lipitor 10 mg daily.  6.  Aspirin 81 mg daily.  7.  Proscar 5 mg daily.      Jordan [name], P.A. [facility]    ______________________________  Willa Rough, M.D.    RB/MEDQ  D:  09/05/2005  T:  09/06/2005  Job:  [id number]   cc:   Ernestina Penna, M.D.  Fax: [phone]

## 2010-10-28 NOTE — Discharge Summary (Signed)
. [facility]  Patient:    Jordan [name], Jordan [name]                      MRN: [id number] Adm. Date:  [id number] Disc. Date: 10/26/00 Attending:  Rollene Rotunda Dictator:   Joellyn Rued, P.A.-C. CC:         Dr. Lovena Neighbours at [facility], [address] [place]  Dr. Rudi Heap  Dr. Lia Hopping in [place]   Referring Physician Discharge [facility]  DATE OF BIRTH:  July 18, 1945  SUMMARY OF HISTORY:  Jordan [name] is a 64 year old white male who presented with a three to four-day history of predictable exertional substernal chest discomfort which he describes as a tightness, like someone has stuck his hand in his chest and squeezing his heart with radiation to his right and left arm and left shoulder associated with shortness of breath and hypertension and tachycardia.  The symptoms have not occurred at rest or at night.  His last episode was Saturday while dancing at a reception, relived with rest.  Sunday, he had another episode while riding his mountain bike and then while riding an exercise bike, cutting lawn - all episodes relieved with rest.  He was admitted to [facility] for further evaluation and he was ruled out for myocardial infarction.  His history is notable for a family history of premature heart disease and a low HDL for which he was placed on Niaspan; however this was discontinued secondary to elevated LFTs.  He also has increased amount of stress associated with work and BPH, for which he is on Proscar.  LABORATORY DATA:  At [facility], fasting lipids showed triglycerides of 112, cholesterol 176, HDL 38, LDL 116.  Glucose 117, BUN 21, creatinine 1.1, sodium 138, potassium 3.5.  PT 13.1, PTT 23.4.  All CK-MBs were negative for myocardial infarction.  However, troponins were slightly elevated at 0.06-0.08.  EKG showed normal sinus rhythm, incomplete right bundle-branch block. Subsequent EKGs here at [facility] showed sinus  bradycardia, normal sinus rhythm, nonspecific ST-T wave changes, early R wave.  HOSPITAL COURSE:  Jordan [name] was transferred to [facility] for cardiac catheterization on Oct 25, 2000.  This was performed by Dr. Gerri Spore. According to his progress note, he had a 20% proximal RCA, 25% distal RCA.  He had a 20% mid circumflex and a 25% OM-2.  He had a proximal 30% LAD, mid 95% LAD, mid 30% and distal 30% LAD.  A diagonal 2 lesion was 30%.  His ejection fraction was 59% with trace MR.  After reviewing the films, Dr. Gerri Spore performed angioplasty stenting, reducing the 95% lesion to 0%.  He was continued on Integrilin for 18 hours.  Post sheath removal and bedrest he was ambulating without difficulty.  Catheterization site does show slight amount of ecchymosis without hematoma or bruit.  By May 17 he was ambulating the hall without difficulty.  H&H on May 17 was 13.7 and 39.2, platelets 177, wbcs 7.7. Sodium 141, potassium 3.8, BUN 12, creatinine 1.2, glucose 109.  CK total was 82 and 1.1.  EKG did not show any acute changes.  Dr. Gerri Spore felt that he could be discharged home.  DISCHARGE DIAGNOSES: 1. Unstable angina, status post angioplasty stenting of the left anterior    descending artery.  Nonobstructive coronary disease elsewhere. 2. History of hyperlipidemia and early family history.  DISPOSITION:  He is discharged home.  MEDICATIONS:  His new  medications include: 1. Plavix 75 mg q.d. for four weeks. 2. Coated aspirin 325 q.d. 3. Lipitor 20 mg q.h.s. 4. Lopressor 50 mg one-half tablet q.d. 5. Sublingual nitroglycerin as needed. 6. Proscar 5 mg q.d. 7. Multivitamin q.d.  ACTIVITY:  He was advised no lifting, driving, sexual activity, or heavy exertion for two days.  No mountain biking or heavy exercise for one week.  DIET:  Maintain low salt/fat/cholesterol diet.  WOUND CARE:  If he has any problems with his catheterization site he was asked to call us  immediately.  SPECIAL INSTRUCTIONS:  Limited to 2 ounces of alcohol per day and it is noted he will need fasting lipids and LFTs drawn in six weeks.  FOLLOW-UP:  He will see Dr. Myrtis Ser on Nov 06, 2000 at 1:15 p.m. DD:  10/26/00 TD:  10/26/00 Job: [id number]

## 2010-10-28 NOTE — H&P (Signed)
[name], SAO NO.:  [id number]   MEDICAL RECORD NO.:  [id number]          PATIENT TYPE:  INP   LOCATION:  2002                         FACILITY:  [facility]   PHYSICIAN:  Verne Grain, MD   DATE OF BIRTH:  1946-02-06   DATE OF ADMISSION:  09/03/2005  DATE OF DISCHARGE:                                HISTORY & PHYSICAL   PRIMARY CARDIOLOGIST:  Dr. Myrtis Ser   PRIMARY CARE PHYSICIAN:  Dr. Rudi Heap   CHIEF COMPLAINT:  Chest pain.   HISTORY OF PRESENT ILLNESS:  A 65 year old male with history of  hyperlipidemia,  positive family history and personal history of early  coronary artery disease status post LAD stent in May 2002 by Dr. Gerri Spore,  history of paroxysmal atrial fibrillation on Coumadin and diltiazem, history  of anxiety, reports to the emergency room reporting some chest pain over the  weekend that initially occurred while doing some work in the yard.  The pain  seemed to gradually improve with return inside and cessation of the yard  work.  The patient had some recurrence of the discomfort while sitting on  the couch earlier today which raised his concerns further and prompted his  report to the emergency room for further evaluation.  The patient has noted  pain in his left chest as well as right-sided arm in location but no  radiation was noted, no accompanying symptoms of nausea, vomiting,  diaphoresis, or shortness of breath.  The patient was given nitroglycerin in  the emergency room.  He is currently pain free.  His INR is 1.7 on Coumadin.  The patient reports occasionally getting a sensation of chest pain with  palpitations from his atrial fibrillation.  However, he reports no sustained  palpitations that he has been aware of recently.  He reports that his  symptoms over the past couple of days are different that the discomfort that  he experienced prior to his previous stent.  He also notes that he also has  experienced some sensation of  just not feeling quite right for the past  few weeks with some fatigue that he is not able to attribute to any one  specific cause.   REVIEW OF SYSTEMS:  Essentially negative beyond the chest discomfort  described above.   ALLERGIES/ADVERSE REACTIONS:  1.  SULFA.  2.  LEVAQUIN.   CURRENT MEDICATIONS:  1.  Aspirin 81 mg p.o. daily.  2.  Diltiazem 180 mg p.o. daily and 60 mg of the short-acting Diltiazem      p.r.n. for rapid heart rates.  3.  Finasteride 5 mg p.o. q.a.m.  4.  Niaspan 1.5 g p.o. q.a.m.  5.  Sublingual nitroglycerin p.r.n.  6.  Coumadin 2.5 mg p.o. q.p.m. (recently changed from alternating 2.5 mg      and 5 mg doses).  7.  Lipitor 10 mg p.o. q.p.m.   PAST MEDICAL HISTORY:  1.  Coronary artery disease status post 3.0 x 13 mm LAD stent administered      via the right femoral artery by Dr. Gerri Spore in May 2002.  Subsequent  additional evaluation of the remainder of the patient's epicardial      coronary vasculature revealed a normal left main, left circumflex 25%,      and right coronary artery of 25%.  He has received a stress Cardiolite      in August 2003 and December 2002 which were both negative.  Most recent      echocardiogram showed an ejection fraction of 55% with mild RV      dilation/possible hypokinesis of unclear etiology, mild LVH, mild aortic      and mitral valve thickening.  2.  Hyperlipidemia.  3.  Positive family history of early coronary artery disease (brother with      MI at age 32).  4.  Benign prostatic hypertrophy with history of prostatitis treated with      antibiotics in the past.  5.  History of paroxysmal atrial fibrillation since April 2003 treated with      Rythmol in the past but currently in sinus rhythm the majority of the      time with diltiazem for paroxysms of atrial fibrillation in need of rate      control.  6.  Chronic Coumadin therapy for atrial fibrillation.  7.  History of palpitations with atrial fibrillation.   8.  Anxiety.  9.  History of colon polyps (February 2006).   SOCIAL HISTORY:  [place]  of 38 years.  He reports no tobacco use, no alcohol use, and no drug use.  He works in a Public librarian and other Arts administrator.  He is very active, mountain biking and snow skiing in his spare time.   FAMILY HISTORY:  The patient's mother died at age 49 of a myocardial  infarction.  Brother had a myocardial infarction at age 43.  The patient's  father died at age 42 of old age.   REVIEW OF SYSTEMS:  No recent fevers, chills, sweats, weight change or  adenopathy.  No headache, no acute alternation in auditory or visual acuity.  The patient reports no acute rash.  No respiratory complaints.  No  claudication, cough, or wheezing.  No syncope or presyncope.  Chest pain is  noted as described in the HPI.  The review of systems are otherwise  negative.  There are no bowel or bladder complaints, no acute  neuropsychiatric complaints, no acute joint complaints, no heat or cold  intolerance, no polyuria or polydipsia, no skin or hair changes suggestive  of endocrinologic abnormality.   PHYSICAL EXAMINATION:  VITAL SIGNS:  Temperature 98.2, heart rate 78,  respiratory rate 18, blood pressure 137/72 to 106/66.  GENERAL:  The patient is pleasant, cooperative, alert, answers questions  appropriately.  HEENT:  He is normocephalic, atraumatic.  Extraocular eye movements are  intact.  Oropharynx is pink and moist without lesions.  NECK:  Supple.  Carotid pulses are easily palpated.  There is no jugular  venous distention.  There are no carotid bruits.  There is no palpable  lymphadenopathy or thyromegaly.  CARDIOVASCULAR:  Reveals a regular S1 and a regular S2.  No murmurs are  appreciated.  LUNGS:  Lung fields are clear to auscultation bilaterally. SKIN:  Reveals signs of chronic sun exposure but otherwise is unremarkable.  ABDOMEN:  Soft,  nontender, nondistended, with positive bowel sounds.  LOWER EXTREMITIES:  Reveals no evidence of edema.  Distal pulses are 2+ and  symmetric in all four extremities.  NEUROLOGIC:  Grossly  nonfocal.  The patient moves all four extremities  without difficulty but gait was not tested.   EKG:  Sinus rhythm at a rate of 75 with normal axis, normal PR and QT  intervals.  QRS duration at the upper limits of normal with an incomplete  right bundle-branch block pattern.   Laboratory values:  White blood cell count 9, hematocrit 45, platelet count  186.  Sodium 139, potassium 4.2, chloride 103, bicarb 28, BUN 23, creatinine  1.2, glucose 105, total bilirubin 0.8, AST 25, ALT 21, alkaline phosphatase  43.  Urinalysis negative.  Albumin 3.8, total protein 6.3.  CK-MB and  troponin negative x3 over 2 hours in the emergency room.  INR 1.7, PTT 26.  Calcium 9.1.  Differential 75% neutrophils.   IMPRESSION:  A 65 year old male with early coronary artery disease and  history of an LAD stent in May 2002 with subsequent Cardiolite in 2002 and  2003 revealing no evidence of ischemia, left ventricular ejection fraction  normal, hyperlipidemia, paroxysmal atrial fibrillation on Coumadin with  current INR of 1.7 (recent decrease in dose of Coumadin) also with  nonspecific complaints of fatigue over the past few weeks of unclear  etiology, chest pain complaints over the past few days as reported in the  HPI.  EKG without changes diagnostic of ischemia but incomplete right bundle-  branch block pattern with QRS duration of the upper limits of normal noted.  CK-MB and troponin at point-of-care markers over initial 2 hours in the  emergency room are negative.   1.  Chest pain.  The patient has some typical as well as some atypical      features for angina.  Will admit on telemetry with serial cardiac      markers and EKG to exclude myocardial infarction.  The patient's INR of      1.7 is currently too high for  cardiac catheterization.  We will recheck      an INR in the morning while holding Coumadin.  Will order an      echocardiogram for the morning.  If the patient's left ventricular      ejection fraction continues to be normal with normal wall motion and      serial CK and troponin continue to be negative with no recurrent chest      pain, then an exercise treadmill Cardiolite (holding diltiazem) to rule      out ischemia would be reasonable.  However, if the patient has any      recurrent pain or any increase in his markers or any new wall motion      abnormalities on echocardiogram, would have a low threshold for cardiac      catheterization when INR is less than or equal to 1.5.  Will also      continue aspirin, Lipitor, Niaspan as previously prescribed and add      nitroglycerin paste and conservatively-dosed heparin (INR 1.7) while     awaiting completion of ischemic evaluation.  Will also treat empirically      with proton pump inhibitor for any GI component of the patient's chest      discomfort.  2.  Hyperlipidemia.  The patient's LFTs are normal.  Will continue Niaspan      and Lipitor as previously prescribed.  Will check a lipid profile in the      morning to confirm that his lipids are at goal on this medication      regimen.  3.  Paroxysmal atrial fibrillation.  Will hold diltiazem until after stress      test as described above.  Otherwise, will continue with diltiazem long-      acting 180 mg p.o. daily and short-acting diltiazem 60 mg p.r.n. for      rapid heart rate with paroxysms of atrial fibrillation.  Will hold      Coumadin now as previously mentioned until need for cardiac      catheterization is definitely excluded.  4.  History of benign prostatic hypertrophy.  Will continue finasteride as      previously prescribed.  5.  Nonspecific complaints of fatigue.  Will check a TSH and free T4 as well      as an erythrocyte sedimentation rate and chest x-ray PA and lateral  to      rule out any unexpected abnormalities that might explain the patient's      complaints of fatigue.  He has had a urinalysis obtained that was      negative.  He has no symptoms of recurrent prostatitis.  His review of      systems is essentially negative.  From review of the patient's past      medical history, he did have some wall motion abnormalities noted on his      right heart.  The etiology and significance of these abnormalities has      been unclear.  In light of the patient's history of atrial fibrillation,      it is possible that the patient could have something such a sleep apnea      which could contribute to his sensation of fatigue and possibly abnormal      right heart findings.  The patient does not have an obvious body habitus      that would go along with sleep apnea.  However, this possibility could      be considered along with other possible explantations for right heart      abnormalities if noted on repeat echocardiogram ordered tomorrow morning      if the patient's symptoms of fatigue otherwise go unexplained.           ______________________________  Verne Grain, MD     DDH/MEDQ  D:  09/04/2005  T:  09/05/2005  Job:  [id number]

## 2010-10-28 NOTE — Assessment & Plan Note (Signed)
[facility]                            CARDIOLOGY OFFICE NOTE   VOYD, GROFT                      MRN:          [id number]  DATE:09/03/2006                            DOB:          01-25-1946    Mr. Jordan [name] is seen for cardiology followup.  He has been active.  He did  take his family skiing in the [place] mountains this year.  He  does have his episodes of atrial fibrillation.  He knows when he has  atrial fibrillation and he takes extra Diltiazem and he actually deals  with this well.  He does not have chest pain.  The episodes appear to be  random.  We again discussed the latest technology on he approach to  atrial fibrillation.  It is clear that he still does not need atrial  fibrillation.  He understands that more potent antiarrhythmics might  cause side-effects and he would prefer not to use them.  He is getting  very good at taking extra doses of short-acting Diltiazem when needed  and he is doing good.  He is careful to keep away from caffeine in any  way.  He has gained some weight and he plans to try and lose this as we  did speak at great length about his atrial fibrillation.   The patient also has been very careful about the approach to his lipids.  He continues on Niaspan, fish oil and Lipitor.  I have a copy of his  Lipomed done on May 22, 2006.   ALLERGIES:  SULFA AND LEVAQUIN.   MEDICATIONS:  1. Lipitor 10.  2. Coumadin as directed.  3. Proscar 5.  4. Aspirin 81.  5. Niaspan 1500 mg.  6. Diltiazem 180 (plus short-acting Diltiazem as needed).  7. Fish oil.   PAST MEDICAL HISTORY:  See the list below.   REVIEW OF SYSTEMS:  He really is doing well.  He has not had any major  GI or GU symptoms on his medications.  Review of systems is negative  other than the HPI.   PHYSICAL EXAMINATION:  VITAL SIGNS:  Weight 216 pounds.  This is up 8-10  pounds for him.  Blood pressure 115/80 with a pulse of 60.  GENERAL:   The patient is oriented to person, time and place.  Affect is  normal.  HEENT:  There is no xanthelasma.  He has normal extraocular motions.  He  has no carotid bruits.  There is no jugular venous distention.  LUNGS:  Clear.  Respiratory effort is not labored.  CARDIAC:  S1, S2.  No clicks or significant murmurs.  ABDOMEN:  Soft with no masses or bruits.  EXTREMITIES:  He has no significant peripheral edema.  He has normal  distal pulses.  There are no musculoskeletal deformities.   LABORATORY DATA AND X-RAY FINDINGS:  EKG today shows normal sinus  rhythm.  I have a copy of his Lipomed.  His LDL particle numbers are in  the optimal range.  His HDL total is reduced somewhat to 44.  It had  been a little higher  before.  His HDL particle size is in the mid range.  Other parameters are good and I would not recommend changing his  medications at this point.  There is no need for any exercise testing at  this time.  He did have a Myoview in November 2005.   PROBLEM LIST:  1. Paroxysmal atrial fibrillation.  We have once again discussed all      the issues and we will keep him on his current medicines.  2. Coronary disease.  This is stable.  3. Benign prostatic hypertrophy.  4. Back pain.  5. History of some anxiety, but this has been quite stable over the      years and he does very well with his atrial fibrillation now.  6. Normal left ventricular function.  7. Lipid abnormalities nicely treated.  8. History of allergies to Levaquin and sulfa.  9. Mild beta-blocker intolerance.   PLAN:  We will not change his medications.  We talked about some weight  loss and he comitted to trying to lose some weight before his next  visit.  I will see him in followup.     Luis Abed, MD, [facility]  Electronically Signed    JDK/MedQ  DD: 09/03/2006  DT: 09/03/2006  Job #: [id number]   cc:   Ernestina Penna, M.D.

## 2010-10-28 NOTE — Discharge Summary (Signed)
NAME:  Jordan [name], Jordan [name]                         ACCOUNT NO.:  [id number]   MEDICAL RECORD NO.:  [id number]                   PATIENT TYPE:  INP   LOCATION:  4729                                 FACILITY:  [facility]   PHYSICIAN:  Chelbi Herber DICTATOR                    DATE OF BIRTH:  02/15/1946   DATE OF ADMISSION:  01/13/2002  DATE OF DISCHARGE:  01/15/2002                                 DISCHARGE SUMMARY   REASON FOR ADMISSION:  Chest pain and palpitations.   CARDIOLOGIST:  Luis Abed, M.D.   DISCHARGE DIAGNOSES:  1. Chest pain and palpitations, no evidence this admission of myocardial     ischemia or demonstrated arrhythmia.  2. History of paroxysmal atrial fibrillation admission April 2003 for this,     conversion to normal sinus rhythm with use of diltiazem and Rhythmol.  3. Coronary artery disease treated with stent to the left anterior     descending by Dr. Sharlene Dory in May 2002 reducing stenosis there from 95%     to 0. Diffuse nonobstructive coronary disease in other arteries, none     exceeding 25%.  4. Abnormal 2-D echocardiography performed last admission leaving some     question of increase in right heart size with reduction in right heart     function.  5. Dyslipidemia with chronically depressed HDL currently on statin therapy.  6. Benign prostatic hypertrophy.  7. Chronic lumbar back pain.  8. Anxiety.  9. Perception of tachy palpitations without monitor evidence of dysrhythmia.   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 Jerral Bonito for several years.  His history is outlined above. On the day of admission, he reports getting  up and feeling basically unwell. He took his medications as usual but did  not feel like doing his usual bike exercises. He went to work and felt his  pulse rate elevate to over 100. This was accompanied by light headedness and  shortness of breath. The episode  resolved in half an hour without  intervention; however, later while sitting in his chair working in his lab  he experienced another increase in heart rate this time to 150 accompanied  by some left anterior chest pain and palpitations. He describes the chest  pain as a discomfort which is sharp and not at all like his prestent chest  pain. He did not take nitroglycerin for this which he does have nor did he  take supplemental diltiazem which was given to him by Dr. Myrtis Ser for these  episodes. He presented for further evaluation to the ED and was having no  chest pain when I talked to him.   HOSPITAL COURSE:  We admitted the patient and maintained him on his home  medications. He was carefully monitored on telemetry and experienced no  tachycardia during  this admission. Serial enzymes were drawn and his  Coumadin was stopped in anticipation of needing another cardiac  catheterization; however, all cardiac enzymes were negative in three sets  and his EKG was not diagnostic for ischemia. There was an incomplete right  bundle branch block seen but this was not a new finding.   A stress Cardiolite was performed day four of admission and this was read by  radiology to be normal  with an EF of 59%. The patient was kept overnight  and continued on telemetry but no arrhythmia or tachycardia was found. He  did have some indigestion overnight which is transient.   The day of discharge, the patient was seen by Dr. Eden Emms. Plans were made  for resumption of his Coumadin with follow-up in the Coumadin Clinic and a  session with Dr. Myrtis Ser in early September to decide on further treatment  options.   PHYSICAL EXAMINATION:  GENERAL:  The day of discharge, the patient offered  no complaints of chest pain, palpitations or presyncope. NAD.  VITAL SIGNS:  Blood pressure 107/67, pulse 56, respirations 20, pulse ox 99%  on room air, temperature 96.4.  CARDIAC:  Telemetry no atrial fibrillation. Heart regular  rate and rhythm,  S1, S2 are present without murmur.  LUNGS:  Clear to auscultation bilaterally.  EXTREMITIES:  Without edema.   LABORATORY DATA:  Cardiolite as mentioned is negative as were enzymes in  three sets. Admission INR was 1.6 and the patient admits to some difficulty  with the Coumadin Clinic getting him therapeutic. Discharge chemistries,  sodium 144, potassium 4.1, glucose 92, BUN 20, creatinine 1.1. Discharge  hemogram WBC 8.0, hemoglobin 14.8, hematocrit 43.4 and platelets 180. A  lipid profile was obtained and total cholesterol was 117, triglycerides 63,  HDL 38, and LDL 66. TSH is normal.   Chest x-ray no active disease.   DISPOSITION:  The patient is discharged to home in the care of his very  supportive wife on his preadmission medications which include aspirin 325  one q.d., Cardizem 180 one q.d., Lipitor 10 1 q.h.s. and Coumadin 7.5 mg  today and tomorrow and then resume previous dosing schedule which is 5 mg  Monday and Friday and 2.5 every other day.   DISCHARGE ACTIVITIES:  There will be no activity restrictions. The patient  knows if he experiences these spells and he becomes symptomatic that he is  to take fall precautions and try to identify triggers. He knows never to  hesitate to bring any chest discomfort or symptomatic spells to our  attention.   DISCHARGE DIET:  Heart healthy diet. The patient has recently stopped  drinking red wine which interfered with his INR and he is in discussions  with Shelby Dubin about green leafy vegetables and other dietary elements. He  will follow-up with the Coumadin Clinic, Tuesday, August 12 at 9:45 and see  Dr. Myrtis Ser September 2 at 11:30 and knows to call the intern with any  problems, concerns or any change or increase in symptoms.                                                Adaley Kiene DICTATOR    DD/MEDQ  D:  01/15/2002  T:  01/15/2002  Job:  [id number]  cc:   Ernestina Penna, M.D.

## 2010-10-28 NOTE — H&P (Signed)
[place]  Patient:    Jordan [name], Jordan [name] Visit Number: [id number] MRN: [id number]          Service Type: MED Location: [id number] Attending Physician:  Talitha Givens Dictated by:   Doylene Canning. Ladona Ridgel, M.D. [facility] Admit Date:  10/02/2001   CC:         Tyson Dense, M.D.  Luis Abed, M.D. [facility]   History and Physical  ADMITTING DIAGNOSIS: New onset atrial fibrillation.  CHIEF COMPLAINT: Palpitations and chest pain.  HISTORY OF PRESENT ILLNESS: The patient is a very pleasant 65 year old male, with ischemic heart disease, hyperlipidemia, and hypertension.  He is status post percutaneous angioplasty in May 2002 and at that time he was found to have 95% mid LAD stenosis, which underwent stent with good result.  His LV function was preserved.  He has done fairly well and is exercising regularly, and stating that on Saturday he rode his bike for nearly an hour.  He had palpitations on Monday and came to our office, and was found to be in sinus rhythm (his palpitations had subsequently resolved).  Yesterday he felt okay but this evening he had recurrent palpitations associated with mild chest pressure and presented to the emergency department, where he was in atrial fibrillation with rapid ventricular rate.  He has been treated with IV Cardizem with improvement in his ventricular rate, but still remains in A fib. He is presently not having any chest pain after initiation of IV Cardizem. The patient notes that he had increasing amounts of chocolate on Sunday but otherwise has not changed his diet, not had any caffeine, and overall felt quite well.  SOCIAL HISTORY: The patient is married.  He owns a Ambulance person.  He has three children.  He denies tobacco use.  He drinks one glass of red wine a day.  FAMILY HISTORY: His mother died at age 34 of MI.  Father died at age 51 of old age.  REVIEW OF SYSTEMS: Notable for no fevers, chills,  or night sweats.  He does have two to three weeks of mild fatigue.  Despite this is he able to exercise. He denies any headache, vision or hearing changes.  He denies any skin changes or rashes or lesions.  He denies shortness of breath, dyspnea on exertion, or PND.  He does have chest pain and palpitations as previously noted.  He denies cough or claudication.  He denies dysuria, hematuria, or nocturia.  He denies any numbness or mood disturbance, but has had some increase in anxiety and weakness lately.  He denies arthralgias or joint swelling.  He denies nausea, vomiting, diarrhea, or constipation.  He denies polyuria or polydipsia.  PHYSICAL EXAMINATION:  GENERAL: He is a pleasant, well-appearing middle-aged man in no distress.  VITAL SIGNS: Blood pressure 95/60, pulse 110-120 and irregularly irregular, respirations 18-20.  Weight 200 pounds.  Oxygen saturation 98%.  HEENT: Normocephalic, atraumatic.  PERRL.  Oropharynx moist.  No obvious exudate.  NECK: No jugular venous distention.  Carotids 1+ and symmetric.  Trachea midline.  No thyromegaly.  No lymphadenopathy appreciated.  CARDIOVASCULAR: Irregularly irregular tachycardia without murmur.  LUNGS: Clear bilaterally to auscultation.  ABDOMEN: Soft, nontender, nondistended.  No organomegaly, rebound or guarding.  SKIN: No rash or lesions.  EXTREMITIES: No clubbing, cyanosis, or edema.  No petechiae or other lesions.  NEUROLOGIC: Alert and oriented x3.  Cranial nerves II-XII intact.  Strength 5/5 and symmetric.  LABORATORY DATA: Chest x-ray reportedly  showed no acute disease.  EKG demonstrates atrial fibrillation with rapid ventricular rate, no nonspecific ST-T wave changes.  Laboratories were notable for a creatinine of 1.1, troponin of 0.02, and CK-MB of 1.6.  MCV is 87.  Glucose was 110.  Hemoglobin is 15.9.  IMPRESSION:  1. New onset atrial fibrillation.  2. Chest pain associated with #1 in a man with a history  of coronary disease.  3. Hyperlipidemia.  4. Chronic prostatitis.  PLAN:  1. Will plan to admit the patient for observation.  2. He will receive a large dose of propafenone in hopes of converting him     back to sinus rhythm.  3. Will plan on obtaining a 2D echocardiogram.  4. Will hold off on initial Coumadin therapy in this patient at the present     time.  5. Will continue IV Cardizem to help control his ventricular rate until he is     back in sinus rhythm.  6. Should he remain in fibrillation we would consider DC cardioversion in the     a.m. Dictated by:   Doylene Canning. Ladona Ridgel, M.D. [facility] Attending Physician:  Talitha Givens DD:  10/02/01 TD:  10/03/01 Job: [id number]

## 2010-10-28 NOTE — Op Note (Signed)
Jordan [name], Jordan [name]               ACCOUNT NO.:  [id number]   MEDICAL RECORD NO.:  [id number]          PATIENT TYPE:  AMB   LOCATION:  ENDO                         FACILITY:  [facility]   PHYSICIAN:  John C. Madilyn Fireman, M.D.    DATE OF BIRTH:  [birth year]   DATE OF PROCEDURE:  07/25/2004  DATE OF DISCHARGE:                                 OPERATIVE REPORT   INDICATIONS FOR PROCEDURE:  Average risk colon cancer screening.   PROCEDURE:  The patient was placed in the left lateral decubitus position  and placed on the pulse monitor with continuous low-flow oxygen delivered by  nasal cannula. He was sedated with 70 mcg IV fentanyl and 6 mg IV Versed.  Olympus video colonoscope was inserted into the rectum and advanced into the  cecum, confirmed by transillumination of McBurney's point and visualization  of ileocecal valve and appendiceal orifice. Prep was good. The cecum  appeared normal with no masses, polyps, diverticula or other mucosal  abnormalities. Within the ascending colon, there were two polyps  approximately 8 and 10 mm in diameter, and these were removed by hot biopsy  and snare respectively. The remainder of the ascending, transverse,  descending, sigmoid and rectum appeared normal with no further polyps,  masses, diverticula or other mucosal abnormalities. The rectum likewise  appeared normal. Retroflexed view of the anus revealed no obvious internal  hemorrhoids. The scope was then withdrawn, and the patient returned to the  recovery room in stable condition. He tolerated the procedure well. There  were no immediate complications.   IMPRESSION:  Ascending colon polyps.   PLAN:  Await histology to determine method and interval for future colon  screening.      JCH/MEDQ  D:  07/25/2004  T:  07/25/2004  Job:  [id number]   cc:   Ernestina Penna, M.D.  [address] [place]  [place] [address]  Fax: [phone]

## 2011-01-30 ENCOUNTER — Ambulatory Visit: Payer: BC Managed Care – PPO | Attending: Orthopaedic Surgery | Admitting: Physical Therapy

## 2011-01-30 DIAGNOSIS — IMO0001 Reserved for inherently not codable concepts without codable children: Secondary | ICD-10-CM | POA: Insufficient documentation

## 2011-01-30 DIAGNOSIS — M25569 Pain in unspecified knee: Secondary | ICD-10-CM | POA: Insufficient documentation

## 2011-02-01 ENCOUNTER — Ambulatory Visit: Payer: BC Managed Care – PPO | Admitting: Physical Therapy

## 2011-02-06 ENCOUNTER — Ambulatory Visit: Payer: BC Managed Care – PPO | Admitting: Physical Therapy

## 2011-02-08 ENCOUNTER — Ambulatory Visit: Payer: BC Managed Care – PPO | Admitting: Physical Therapy

## 2011-02-14 ENCOUNTER — Ambulatory Visit: Payer: BC Managed Care – PPO | Attending: Orthopaedic Surgery | Admitting: *Deleted

## 2011-02-14 DIAGNOSIS — IMO0001 Reserved for inherently not codable concepts without codable children: Secondary | ICD-10-CM | POA: Insufficient documentation

## 2011-02-14 DIAGNOSIS — M25569 Pain in unspecified knee: Secondary | ICD-10-CM | POA: Insufficient documentation

## 2011-02-16 ENCOUNTER — Ambulatory Visit: Payer: BC Managed Care – PPO | Admitting: *Deleted

## 2011-02-16 ENCOUNTER — Ambulatory Visit: Payer: Self-pay | Admitting: Cardiology

## 2011-02-20 ENCOUNTER — Encounter: Payer: Self-pay | Admitting: Cardiology

## 2011-02-20 ENCOUNTER — Encounter: Payer: BC Managed Care – PPO | Admitting: Physical Therapy

## 2011-02-20 DIAGNOSIS — M25569 Pain in unspecified knee: Secondary | ICD-10-CM | POA: Insufficient documentation

## 2011-02-20 DIAGNOSIS — R002 Palpitations: Secondary | ICD-10-CM | POA: Insufficient documentation

## 2011-02-20 DIAGNOSIS — Z7901 Long term (current) use of anticoagulants: Secondary | ICD-10-CM | POA: Insufficient documentation

## 2011-02-20 DIAGNOSIS — Z789 Other specified health status: Secondary | ICD-10-CM | POA: Insufficient documentation

## 2011-02-20 DIAGNOSIS — R55 Syncope and collapse: Secondary | ICD-10-CM | POA: Insufficient documentation

## 2011-02-20 DIAGNOSIS — I2510 Atherosclerotic heart disease of native coronary artery without angina pectoris: Secondary | ICD-10-CM | POA: Insufficient documentation

## 2011-02-20 DIAGNOSIS — I4891 Unspecified atrial fibrillation: Secondary | ICD-10-CM | POA: Insufficient documentation

## 2011-02-20 DIAGNOSIS — E785 Hyperlipidemia, unspecified: Secondary | ICD-10-CM | POA: Insufficient documentation

## 2011-02-20 DIAGNOSIS — F419 Anxiety disorder, unspecified: Secondary | ICD-10-CM | POA: Insufficient documentation

## 2011-02-20 DIAGNOSIS — I4510 Unspecified right bundle-branch block: Secondary | ICD-10-CM | POA: Insufficient documentation

## 2011-02-20 DIAGNOSIS — R9430 Abnormal result of cardiovascular function study, unspecified: Secondary | ICD-10-CM | POA: Insufficient documentation

## 2011-02-20 DIAGNOSIS — R0989 Other specified symptoms and signs involving the circulatory and respiratory systems: Secondary | ICD-10-CM | POA: Insufficient documentation

## 2011-02-22 ENCOUNTER — Encounter: Payer: Self-pay | Admitting: Cardiology

## 2011-02-22 ENCOUNTER — Ambulatory Visit: Payer: BC Managed Care – PPO | Admitting: Physical Therapy

## 2011-02-23 ENCOUNTER — Ambulatory Visit (INDEPENDENT_AMBULATORY_CARE_PROVIDER_SITE_OTHER): Payer: BC Managed Care – PPO | Admitting: Cardiology

## 2011-02-23 ENCOUNTER — Encounter: Payer: Self-pay | Admitting: Cardiology

## 2011-02-23 DIAGNOSIS — F419 Anxiety disorder, unspecified: Secondary | ICD-10-CM

## 2011-02-23 DIAGNOSIS — Z7901 Long term (current) use of anticoagulants: Secondary | ICD-10-CM

## 2011-02-23 DIAGNOSIS — E785 Hyperlipidemia, unspecified: Secondary | ICD-10-CM

## 2011-02-23 DIAGNOSIS — I4891 Unspecified atrial fibrillation: Secondary | ICD-10-CM

## 2011-02-23 DIAGNOSIS — I2510 Atherosclerotic heart disease of native coronary artery without angina pectoris: Secondary | ICD-10-CM

## 2011-02-23 MED ORDER — DILTIAZEM HCL ER COATED BEADS 180 MG PO CP24: 180.0000 mg | ORAL_CAPSULE | Freq: Every day | ORAL | Status: DC

## 2011-02-23 MED ORDER — DILTIAZEM HCL 60 MG PO TABS: 60.0000 mg | ORAL_TABLET | ORAL | Status: DC | PRN

## 2011-02-23 MED ORDER — NITROGLYCERIN 0.4 MG SL SUBL: 0.4000 mg | SUBLINGUAL_TABLET | SUBLINGUAL | Status: DC | PRN

## 2011-02-23 NOTE — Assessment & Plan Note (Signed)
The patient is very careful and complete about his health care.  He has many stresses at work but deals with these.  He is quite stable.

## 2011-02-23 NOTE — Patient Instructions (Signed)
Your physician wants you to follow-up in:  6 months. You will receive a reminder letter in the mail two months in advance. If you don't receive a letter, please call our office to schedule the follow-up appointment.   

## 2011-02-23 NOTE — Assessment & Plan Note (Signed)
I had a careful discussion with the patient about her medications and Coumadin.  He understands them well and does very well with Coumadin.  He does not want to take any chance that any new medicine which upset his GI tract.  No change.  I'll see him back in 6 months.  Also as part of today's evaluation I have reviewed my old records and very carefully palpated the new current EMR.

## 2011-02-23 NOTE — Progress Notes (Signed)
HPI Patient is seen today for cardiology followup .  He is doing very well.  He's not having any chest pain or shortness of breath.  He has not had any recent significant paroxysms of atrial fibrillation.  He brought me his last lipomed.  The results are excellent.  He is fully active.  He and I spoke for greater than 30 minutes about his overall cardiovascular status.  We discussed the use of Coumadin and niacin.  We discussed the dose of his Cardizem.  We discussed his current knee therapy.  We discussed fish oil. Allergies  Allergen Reactions  . Levofloxacin   . Sulfonamide Derivatives     Current Outpatient Prescriptions  Medication Sig Dispense Refill  . atorvastatin (LIPITOR) 40 MG tablet Take 40 mg by mouth daily.        . Calcium Carb-Cholecalciferol (CALCIUM 600+D3 PO) Take 1 capsule by mouth daily.        . Cholecalciferol (VITAMIN D3) 1000 UNITS CAPS Take 1 capsule by mouth daily.        Marland Kitchen diltiazem (CARDIZEM CD) 180 MG 24 hr capsule Take 180 mg by mouth daily.        Marland Kitchen diltiazem (CARDIZEM) 60 MG tablet Take 60 mg by mouth as needed.        . finasteride (PROSCAR) 5 MG tablet Take 5 mg by mouth daily.        . fish oil-omega-3 fatty acids 1000 MG capsule Take 2,000 mg by mouth daily.        . Glucosamine-Chondroitin 1500-1200 MG/30ML LIQD Take 1,500 mg by mouth daily.        . Multiple Vitamins-Minerals (MULTIVITAMIN WITH MINERALS) tablet Take 1 tablet by mouth daily.        . Niacin, Antihyperlipidemic, (NIASPAN PO) Take 1,500 mg by mouth daily.        . nitroGLYCERIN (NITROSTAT) 0.4 MG SL tablet Place 0.4 mg under the tongue every 5 (five) minutes as needed.        . warfarin (COUMADIN) 2.5 MG tablet Take 2.5 mg by mouth daily. Take 5mg  once a week         History   Social History  . Marital Status: Married    Spouse Name: N/A    Number of Children: N/A  . Years of Education: N/A   Occupational History  . Dental Laboratory     Makes dental appliances   Social History  Main Topics  . Smoking status: Never Smoker   . Smokeless tobacco: Never Used  . Alcohol Use: No  . Drug Use: No  . Sexually Active: Not on file   Other Topics Concern  . Not on file   Social History Narrative  . No narrative on file    Family History  Problem Relation Age of Onset  . Heart attack Mother   . Heart attack Brother     Past Medical History  Diagnosis Date  . CAD (coronary artery disease)     Stent LAD, 2002  /  nuclear 2009, no ischemia  /    [facility] October, 2011 nuclear, no ischemia, possible slight apical scar, ejection fraction 70%  . Ejection fraction     EF 65-70%, echo, 2007 /  EF 70%, nuclear, 2011  . Atrial fibrillation     Paroxysmal  . Warfarin anticoagulation     Low CHADS , score, but patient wants to be aggressive  . Drug intolerance     Mild beta blocker intolerance  .  Incomplete RBBB   . Syncope     November, 2010  . Carotid bruit     Doppler November, 2011, normal  . Knee pain     November, 201  . BPH (benign prostatic hyperplasia)   . Back pain   . Anxiety     Mild  . Dyslipidemia   . Colon polyps   . Palpitations     No past surgical history on file.  ROS  Patient denies fever, chills, headache, sweats, rash, change in vision, change in hearing, chest pain, cough, nausea vomiting, urinary symptoms.  All other systems are reviewed and are negative.  PHYSICAL EXAM Patient looks great.  Head is atraumatic.  There is no jugular venous distention.  No carotid bruits.  Lungs are clear.  Respiratory effort is nonlabored.  Cardiac exam reveals S1-S2.  No clicks or significant murmurs.  Abdomen is soft there is no peripheral edema.  There are no musculoskeletal deformities.  He has some mild venous changes in the skin in his legs. Filed Vitals:   02/23/11 0950  BP: 108/67  Pulse: 57  Height: 6\' 2"  (1.88 m)  Weight: 210 lb 12.8 oz (95.618 kg)    EKG  EKG is done today And reviewed by me..  There is incomplete right bundle branch  block.  Sinus bradycardia.  No change.  ASSESSMENT & PLAN

## 2011-02-23 NOTE — Assessment & Plan Note (Signed)
Patient has not had any significant atrial fibrillation.  His meds will not be changed.  We discussed the possibility of lowering his Cardizem dose but decided to leave at the same

## 2011-02-23 NOTE — Assessment & Plan Note (Signed)
His lipids are excellent.  We discussed his use of niacin.  Currently the recommendation is not to add niacin if LDL target is reached with a statin.  In his case he has been on niacin for a long time and has had a dramatic improvement in his HDL.  I feel it is appropriate to distribute continued.

## 2011-02-23 NOTE — Assessment & Plan Note (Signed)
Coronary disease is stable. He does not need any further workup at this time. 

## 2011-02-28 ENCOUNTER — Ambulatory Visit: Payer: BC Managed Care – PPO | Admitting: *Deleted

## 2011-03-02 ENCOUNTER — Ambulatory Visit: Payer: BC Managed Care – PPO | Admitting: Physical Therapy

## 2011-03-08 LAB — POCT I-STAT, CHEM 8
BUN: 25 — ABNORMAL HIGH
Calcium, Ion: 1.05 — ABNORMAL LOW
Chloride: 103
Glucose, Bld: 107 — ABNORMAL HIGH
TCO2: 26

## 2011-03-08 LAB — DIFFERENTIAL
Basophils Relative: 0
Eosinophils Absolute: 0
Eosinophils Relative: 0
Lymphs Abs: 0.9
Monocytes Relative: 4
Neutrophils Relative %: 91 — ABNORMAL HIGH

## 2011-03-08 LAB — RAPID STREP SCREEN (MED CTR MEBANE ONLY): Streptococcus, Group A Screen (Direct): NEGATIVE

## 2011-03-08 LAB — CBC
HCT: 45.7
MCHC: 35.1
MCV: 90.5
Platelets: 175
WBC: 19.2 — ABNORMAL HIGH

## 2011-03-08 LAB — PROTIME-INR
INR: 2 — ABNORMAL HIGH
Prothrombin Time: 23.5 — ABNORMAL HIGH

## 2011-03-08 LAB — URINALYSIS, ROUTINE W REFLEX MICROSCOPIC
Ketones, ur: 40 — AB
Nitrite: NEGATIVE
Protein, ur: NEGATIVE

## 2011-03-08 LAB — POCT CARDIAC MARKERS
CKMB, poc: <1 — ABNORMAL LOW
Myoglobin, poc: 81.4

## 2011-03-26 DIAGNOSIS — S83281A Other tear of lateral meniscus, current injury, right knee, initial encounter: Secondary | ICD-10-CM | POA: Insufficient documentation

## 2011-03-26 DIAGNOSIS — M1711 Unilateral primary osteoarthritis, right knee: Secondary | ICD-10-CM | POA: Insufficient documentation

## 2011-03-26 HISTORY — DX: Other tear of lateral meniscus, current injury, right knee, initial encounter: S83.281A

## 2011-04-24 ENCOUNTER — Telehealth: Payer: Self-pay | Admitting: Cardiology

## 2011-04-24 NOTE — Telephone Encounter (Signed)
Pt needs letter with dates of  angioplasty and stent placed and when a-fib episode was for changed insurance, pls mail to pt

## 2011-04-24 NOTE — Telephone Encounter (Signed)
Left a message to call back.

## 2011-04-25 NOTE — Telephone Encounter (Signed)
Letter mailed with this information

## 2011-04-25 NOTE — Telephone Encounter (Signed)
Pt was notified that letter has been mailed.

## 2011-04-25 NOTE — Telephone Encounter (Signed)
Routing to debbie 

## 2011-04-25 NOTE — Telephone Encounter (Signed)
According to hospital records, stent was placed 10/25/2000 and Afib diagnosed 09/2001.

## 2011-05-11 ENCOUNTER — Other Ambulatory Visit: Payer: Self-pay | Admitting: Dermatology

## 2011-09-26 ENCOUNTER — Encounter: Payer: Self-pay | Admitting: Cardiology

## 2011-09-29 ENCOUNTER — Other Ambulatory Visit: Payer: Self-pay | Admitting: Cardiology

## 2011-10-11 ENCOUNTER — Encounter: Payer: Self-pay | Admitting: Internal Medicine

## 2012-03-08 ENCOUNTER — Ambulatory Visit: Payer: BC Managed Care – PPO | Admitting: Cardiology

## 2012-03-12 DIAGNOSIS — I4891 Unspecified atrial fibrillation: Secondary | ICD-10-CM | POA: Diagnosis not present

## 2012-03-12 DIAGNOSIS — Z23 Encounter for immunization: Secondary | ICD-10-CM | POA: Diagnosis not present

## 2012-03-18 ENCOUNTER — Encounter: Payer: Self-pay | Admitting: Cardiology

## 2012-03-18 DIAGNOSIS — E785 Hyperlipidemia, unspecified: Secondary | ICD-10-CM | POA: Diagnosis not present

## 2012-03-18 DIAGNOSIS — E039 Hypothyroidism, unspecified: Secondary | ICD-10-CM | POA: Diagnosis not present

## 2012-03-18 DIAGNOSIS — I10 Essential (primary) hypertension: Secondary | ICD-10-CM | POA: Diagnosis not present

## 2012-03-22 DIAGNOSIS — R972 Elevated prostate specific antigen [PSA]: Secondary | ICD-10-CM | POA: Diagnosis not present

## 2012-03-25 ENCOUNTER — Ambulatory Visit (INDEPENDENT_AMBULATORY_CARE_PROVIDER_SITE_OTHER): Payer: Medicare Other | Admitting: Cardiology

## 2012-03-25 ENCOUNTER — Encounter: Payer: Self-pay | Admitting: Cardiology

## 2012-03-25 VITALS — BP 112/69 | Ht 74.0 in | Wt 213.1 lb

## 2012-03-25 DIAGNOSIS — E785 Hyperlipidemia, unspecified: Secondary | ICD-10-CM | POA: Diagnosis not present

## 2012-03-25 DIAGNOSIS — R55 Syncope and collapse: Secondary | ICD-10-CM

## 2012-03-25 DIAGNOSIS — I2510 Atherosclerotic heart disease of native coronary artery without angina pectoris: Secondary | ICD-10-CM

## 2012-03-25 DIAGNOSIS — I4891 Unspecified atrial fibrillation: Secondary | ICD-10-CM | POA: Diagnosis not present

## 2012-03-25 MED ORDER — NITROGLYCERIN 0.4 MG SL SUBL: 0.4000 mg | SUBLINGUAL_TABLET | SUBLINGUAL | Status: DC | PRN

## 2012-03-25 MED ORDER — DILTIAZEM HCL 60 MG PO TABS: 60.0000 mg | ORAL_TABLET | ORAL | Status: DC | PRN

## 2012-03-25 MED ORDER — DILTIAZEM HCL ER COATED BEADS 180 MG PO CP24: 180.0000 mg | ORAL_CAPSULE | Freq: Every day | ORAL | Status: DC

## 2012-03-25 NOTE — Assessment & Plan Note (Signed)
His lipids are treated aggressively by his primary care team. I am in agreement with the approach that they take in his case. No change.

## 2012-03-25 NOTE — Patient Instructions (Addendum)
Your physician wants you to follow-up in: 1 year.   You will receive a reminder letter in the mail two months in advance. If you don't receive a letter, please call our office to schedule the follow-up appointment.  Your cardiologist, Dr Myrtis Ser, gave an ok for you to come off your coumadin for 5-7 days prior to your colonoscopy

## 2012-03-25 NOTE — Assessment & Plan Note (Signed)
The patient's exercise level is high and he has no symptoms. His nuclear scan in 2011 showed no significant ischemia. His ejection fraction is normal. He is getting excellent secondary prevention. No further workup at this time.

## 2012-03-25 NOTE — Assessment & Plan Note (Signed)
He has paroxysmal atrial fibrillation. He had one recent episode. He is on Coumadin because he is always wanted to be aggressive in this regard. We talked about some of the new medications. He reminded me that there are no antidotes and he is not comfortable with this. He is active. Having an antidote for Coumadin actually is wise for him. We will not change his medicine. He seems to only needed checked in the 6-7 week range.

## 2012-03-25 NOTE — Progress Notes (Signed)
Patient ID: Jordan [name], male   DOB: 07-09-1945, 66 y.o.   MRN: [id number]   HPI   Patient is seen today to followup coronary disease and paroxysmal atrial fibrillation. He is doing extremely well. I saw him last one year ago. He and I had a long discussion today about his activities. He has been able to ski. In addition he has led kayaking trips. He's not having any significant symptoms of chest pain or shortness of breath. He had one random brief limited episode of palpitations at rest recently that is probably his atrial fib. He tolerated it well. His lipids are being followed very carefully by his primary team.  Allergies  Allergen Reactions  . Levofloxacin   . Sulfonamide Derivatives     Current Outpatient Prescriptions  Medication Sig Dispense Refill  . aspirin 81 MG tablet Take 81 mg by mouth daily.      Marland Kitchen atorvastatin (LIPITOR) 40 MG tablet Take 40 mg by mouth daily.        . Calcium Carb-Cholecalciferol (CALCIUM 600+D3 PO) Take 1 capsule by mouth daily.        . Cholecalciferol (VITAMIN D3) 1000 UNITS CAPS Take 1 capsule by mouth daily.        Marland Kitchen diltiazem (CARDIZEM CD) 180 MG 24 hr capsule Take 1 capsule (180 mg total) by mouth daily.  30 capsule  5  . diltiazem (CARDIZEM) 60 MG tablet Take 1 tablet (60 mg total) by mouth as needed.  30 tablet  11  . diltiazem (CARDIZEM) 60 MG tablet Take 1 tablet (60 mg total) by mouth as needed.  30 tablet  11  . fish oil-omega-3 fatty acids 1000 MG capsule Take 2,000 mg by mouth daily.        . Glucosamine-Chondroitin 1500-1200 MG/30ML LIQD Take 1,500 mg by mouth daily.        . Multiple Vitamins-Minerals (MULTIVITAMIN WITH MINERALS) tablet Take 1 tablet by mouth daily.        . Niacin, Antihyperlipidemic, (NIASPAN PO) Take 1,500 mg by mouth daily.        . nitroGLYCERIN (NITROSTAT) 0.4 MG SL tablet Place 1 tablet (0.4 mg total) under the tongue every 5 (five) minutes as needed.  25 tablet  11  . warfarin (COUMADIN) 2.5 MG tablet Take 2.5 mg  by mouth daily. Take 5mg  once a week       . DISCONTD: CARDIZEM CD 180 MG 24 hr capsule TAKE (1) CAPSULE DAILY  30 each  5  . DISCONTD: diltiazem (CARDIZEM) 60 MG tablet Take 1 tablet (60 mg total) by mouth as needed.  30 tablet  11  . DISCONTD: nitroGLYCERIN (NITROSTAT) 0.4 MG SL tablet Place 1 tablet (0.4 mg total) under the tongue every 5 (five) minutes as needed.  12 tablet  11  . DISCONTD: nitroGLYCERIN (NITROSTAT) 0.4 MG SL tablet Place 1 tablet (0.4 mg total) under the tongue every 5 (five) minutes as needed.  25 tablet  11    History   Social History  . Marital Status: Married    Spouse Name: N/A    Number of Children: N/A  . Years of Education: N/A   Occupational History  . 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 Smoking status: Never Smoker   . Smokeless tobacco: Never Used  . Alcohol Use: No  . Drug Use: No  . Sexually Active: Not on file   Other Topics Concern  .  Not on file   Social History Narrative  . No narrative on file    Family History  Problem Relation Age of Onset  . Heart attack Mother   . Heart attack Brother     Past Medical History  Diagnosis Date  . CAD (coronary artery disease)     Stent LAD, 2002  /  nuclear 2009, no ischemia  /    [facility] October, 2011 nuclear, no ischemia, possible slight apical scar, ejection fraction 70%  . Ejection fraction     EF 65-70%, echo, 2007 /  EF 70%, nuclear, 2011  . Atrial fibrillation     Paroxysmal  . Warfarin anticoagulation     Low CHADS , score, but patient wants to be aggressive  . Drug intolerance     Mild beta blocker intolerance  . Incomplete RBBB   . Syncope     November, 2010  . Carotid bruit     Doppler November, 2011, normal  . Knee pain     November, 201  . BPH (benign prostatic hyperplasia)   . Back pain   . Anxiety     Mild  . Dyslipidemia   . Colon polyps   . Palpitations     No past surgical history on file.  Patient Active Problem  List  Diagnosis  . COLONIC POLYPS, HX OF  . BENIGN PROSTATIC HYPERTROPHY, HX OF  . POLYPECTOMY, HX OF  . CAD (coronary artery disease)  . Ejection fraction  . Atrial fibrillation  . Warfarin anticoagulation  . Drug intolerance  . Incomplete RBBB  . Syncope  . Carotid bruit  . Knee pain  . Anxiety  . Dyslipidemia  . Palpitations    ROS   Patient denies fever, chills, headache, sweats, rash, change in vision, change in hearing, chest pain, cough, nausea vomiting, urinary symptoms. All other systems are reviewed and are negative.  PHYSICAL EXAM  Patient is quite stable. He is oriented to person time and place. Affect is normal. Lungs are clear. Respiratory effort is nonlabored. There is no jugulovenous distention. There no carotid bruits. Cardiac exam her vitals S1 and S2. There no clicks or significant murmurs. The abdomen is soft. There is no peripheral edema. There no musculoskeletal deformities. There are no skin rashes.  Filed Vitals:   03/25/12 1511  BP: 112/69  Height: 6\' 2"  (1.88 m)  Weight: 213 lb 1.9 oz (96.671 kg)   EKG is done today and reviewed by me. It is normal. There is no significant change.  ASSESSMENT & PLAN

## 2012-03-25 NOTE — Assessment & Plan Note (Signed)
He has not had any recurrent episodes of presyncope or syncope. No further workup.

## 2012-03-29 DIAGNOSIS — N3941 Urge incontinence: Secondary | ICD-10-CM | POA: Diagnosis not present

## 2012-03-29 DIAGNOSIS — R972 Elevated prostate specific antigen [PSA]: Secondary | ICD-10-CM | POA: Diagnosis not present

## 2012-03-29 DIAGNOSIS — N401 Enlarged prostate with lower urinary tract symptoms: Secondary | ICD-10-CM | POA: Diagnosis not present

## 2012-03-29 DIAGNOSIS — N201 Calculus of ureter: Secondary | ICD-10-CM | POA: Diagnosis not present

## 2012-04-05 DIAGNOSIS — M9981 Other biomechanical lesions of cervical region: Secondary | ICD-10-CM | POA: Diagnosis not present

## 2012-04-05 DIAGNOSIS — M531 Cervicobrachial syndrome: Secondary | ICD-10-CM | POA: Diagnosis not present

## 2012-04-05 DIAGNOSIS — M4712 Other spondylosis with myelopathy, cervical region: Secondary | ICD-10-CM | POA: Diagnosis not present

## 2012-04-15 DIAGNOSIS — E785 Hyperlipidemia, unspecified: Secondary | ICD-10-CM | POA: Diagnosis not present

## 2012-04-15 DIAGNOSIS — N400 Enlarged prostate without lower urinary tract symptoms: Secondary | ICD-10-CM | POA: Diagnosis not present

## 2012-04-23 DIAGNOSIS — I4891 Unspecified atrial fibrillation: Secondary | ICD-10-CM | POA: Diagnosis not present

## 2012-04-29 DIAGNOSIS — N138 Other obstructive and reflux uropathy: Secondary | ICD-10-CM | POA: Diagnosis not present

## 2012-04-29 DIAGNOSIS — R972 Elevated prostate specific antigen [PSA]: Secondary | ICD-10-CM | POA: Diagnosis not present

## 2012-04-29 DIAGNOSIS — N401 Enlarged prostate with lower urinary tract symptoms: Secondary | ICD-10-CM | POA: Diagnosis not present

## 2012-05-06 DIAGNOSIS — N201 Calculus of ureter: Secondary | ICD-10-CM | POA: Diagnosis not present

## 2012-05-06 DIAGNOSIS — R972 Elevated prostate specific antigen [PSA]: Secondary | ICD-10-CM | POA: Diagnosis not present

## 2012-05-06 DIAGNOSIS — N401 Enlarged prostate with lower urinary tract symptoms: Secondary | ICD-10-CM | POA: Diagnosis not present

## 2012-05-30 DIAGNOSIS — I4891 Unspecified atrial fibrillation: Secondary | ICD-10-CM | POA: Diagnosis not present

## 2012-06-10 DIAGNOSIS — M9981 Other biomechanical lesions of cervical region: Secondary | ICD-10-CM | POA: Diagnosis not present

## 2012-06-10 DIAGNOSIS — M5030 Other cervical disc degeneration, unspecified cervical region: Secondary | ICD-10-CM | POA: Diagnosis not present

## 2012-06-10 DIAGNOSIS — M5137 Other intervertebral disc degeneration, lumbosacral region: Secondary | ICD-10-CM | POA: Diagnosis not present

## 2012-06-10 DIAGNOSIS — M999 Biomechanical lesion, unspecified: Secondary | ICD-10-CM | POA: Diagnosis not present

## 2012-06-11 DIAGNOSIS — M5137 Other intervertebral disc degeneration, lumbosacral region: Secondary | ICD-10-CM | POA: Diagnosis not present

## 2012-06-11 DIAGNOSIS — M9981 Other biomechanical lesions of cervical region: Secondary | ICD-10-CM | POA: Diagnosis not present

## 2012-06-11 DIAGNOSIS — M999 Biomechanical lesion, unspecified: Secondary | ICD-10-CM | POA: Diagnosis not present

## 2012-06-11 DIAGNOSIS — M5030 Other cervical disc degeneration, unspecified cervical region: Secondary | ICD-10-CM | POA: Diagnosis not present

## 2012-06-17 DIAGNOSIS — M999 Biomechanical lesion, unspecified: Secondary | ICD-10-CM | POA: Diagnosis not present

## 2012-06-17 DIAGNOSIS — M5137 Other intervertebral disc degeneration, lumbosacral region: Secondary | ICD-10-CM | POA: Diagnosis not present

## 2012-06-17 DIAGNOSIS — M5030 Other cervical disc degeneration, unspecified cervical region: Secondary | ICD-10-CM | POA: Diagnosis not present

## 2012-06-17 DIAGNOSIS — M9981 Other biomechanical lesions of cervical region: Secondary | ICD-10-CM | POA: Diagnosis not present

## 2012-06-18 DIAGNOSIS — M5030 Other cervical disc degeneration, unspecified cervical region: Secondary | ICD-10-CM | POA: Diagnosis not present

## 2012-06-18 DIAGNOSIS — M5137 Other intervertebral disc degeneration, lumbosacral region: Secondary | ICD-10-CM | POA: Diagnosis not present

## 2012-06-18 DIAGNOSIS — M999 Biomechanical lesion, unspecified: Secondary | ICD-10-CM | POA: Diagnosis not present

## 2012-06-18 DIAGNOSIS — M9981 Other biomechanical lesions of cervical region: Secondary | ICD-10-CM | POA: Diagnosis not present

## 2012-06-19 DIAGNOSIS — M5137 Other intervertebral disc degeneration, lumbosacral region: Secondary | ICD-10-CM | POA: Diagnosis not present

## 2012-06-19 DIAGNOSIS — M999 Biomechanical lesion, unspecified: Secondary | ICD-10-CM | POA: Diagnosis not present

## 2012-06-19 DIAGNOSIS — M5030 Other cervical disc degeneration, unspecified cervical region: Secondary | ICD-10-CM | POA: Diagnosis not present

## 2012-06-19 DIAGNOSIS — M9981 Other biomechanical lesions of cervical region: Secondary | ICD-10-CM | POA: Diagnosis not present

## 2012-06-20 DIAGNOSIS — M999 Biomechanical lesion, unspecified: Secondary | ICD-10-CM | POA: Diagnosis not present

## 2012-06-20 DIAGNOSIS — M5137 Other intervertebral disc degeneration, lumbosacral region: Secondary | ICD-10-CM | POA: Diagnosis not present

## 2012-06-20 DIAGNOSIS — M5030 Other cervical disc degeneration, unspecified cervical region: Secondary | ICD-10-CM | POA: Diagnosis not present

## 2012-06-20 DIAGNOSIS — M9981 Other biomechanical lesions of cervical region: Secondary | ICD-10-CM | POA: Diagnosis not present

## 2012-06-24 DIAGNOSIS — M5137 Other intervertebral disc degeneration, lumbosacral region: Secondary | ICD-10-CM | POA: Diagnosis not present

## 2012-06-24 DIAGNOSIS — M999 Biomechanical lesion, unspecified: Secondary | ICD-10-CM | POA: Diagnosis not present

## 2012-06-24 DIAGNOSIS — M9981 Other biomechanical lesions of cervical region: Secondary | ICD-10-CM | POA: Diagnosis not present

## 2012-06-24 DIAGNOSIS — M5030 Other cervical disc degeneration, unspecified cervical region: Secondary | ICD-10-CM | POA: Diagnosis not present

## 2012-06-26 DIAGNOSIS — M5137 Other intervertebral disc degeneration, lumbosacral region: Secondary | ICD-10-CM | POA: Diagnosis not present

## 2012-06-26 DIAGNOSIS — M999 Biomechanical lesion, unspecified: Secondary | ICD-10-CM | POA: Diagnosis not present

## 2012-06-26 DIAGNOSIS — M9981 Other biomechanical lesions of cervical region: Secondary | ICD-10-CM | POA: Diagnosis not present

## 2012-06-26 DIAGNOSIS — M5030 Other cervical disc degeneration, unspecified cervical region: Secondary | ICD-10-CM | POA: Diagnosis not present

## 2012-06-28 DIAGNOSIS — M48061 Spinal stenosis, lumbar region without neurogenic claudication: Secondary | ICD-10-CM | POA: Diagnosis not present

## 2012-06-28 DIAGNOSIS — M542 Cervicalgia: Secondary | ICD-10-CM | POA: Diagnosis not present

## 2012-07-02 DIAGNOSIS — M9981 Other biomechanical lesions of cervical region: Secondary | ICD-10-CM | POA: Diagnosis not present

## 2012-07-02 DIAGNOSIS — M5030 Other cervical disc degeneration, unspecified cervical region: Secondary | ICD-10-CM | POA: Diagnosis not present

## 2012-07-02 DIAGNOSIS — M5137 Other intervertebral disc degeneration, lumbosacral region: Secondary | ICD-10-CM | POA: Diagnosis not present

## 2012-07-02 DIAGNOSIS — M999 Biomechanical lesion, unspecified: Secondary | ICD-10-CM | POA: Diagnosis not present

## 2012-07-04 DIAGNOSIS — M9981 Other biomechanical lesions of cervical region: Secondary | ICD-10-CM | POA: Diagnosis not present

## 2012-07-04 DIAGNOSIS — M999 Biomechanical lesion, unspecified: Secondary | ICD-10-CM | POA: Diagnosis not present

## 2012-07-04 DIAGNOSIS — M5137 Other intervertebral disc degeneration, lumbosacral region: Secondary | ICD-10-CM | POA: Diagnosis not present

## 2012-07-04 DIAGNOSIS — M5030 Other cervical disc degeneration, unspecified cervical region: Secondary | ICD-10-CM | POA: Diagnosis not present

## 2012-07-17 DIAGNOSIS — M999 Biomechanical lesion, unspecified: Secondary | ICD-10-CM | POA: Diagnosis not present

## 2012-07-17 DIAGNOSIS — M5137 Other intervertebral disc degeneration, lumbosacral region: Secondary | ICD-10-CM | POA: Diagnosis not present

## 2012-07-17 DIAGNOSIS — M5030 Other cervical disc degeneration, unspecified cervical region: Secondary | ICD-10-CM | POA: Diagnosis not present

## 2012-07-17 DIAGNOSIS — M9981 Other biomechanical lesions of cervical region: Secondary | ICD-10-CM | POA: Diagnosis not present

## 2012-07-27 ENCOUNTER — Other Ambulatory Visit: Payer: Self-pay

## 2012-07-30 DIAGNOSIS — I4891 Unspecified atrial fibrillation: Secondary | ICD-10-CM | POA: Diagnosis not present

## 2012-07-31 DIAGNOSIS — M999 Biomechanical lesion, unspecified: Secondary | ICD-10-CM | POA: Diagnosis not present

## 2012-07-31 DIAGNOSIS — M5030 Other cervical disc degeneration, unspecified cervical region: Secondary | ICD-10-CM | POA: Diagnosis not present

## 2012-07-31 DIAGNOSIS — M9981 Other biomechanical lesions of cervical region: Secondary | ICD-10-CM | POA: Diagnosis not present

## 2012-07-31 DIAGNOSIS — M5137 Other intervertebral disc degeneration, lumbosacral region: Secondary | ICD-10-CM | POA: Diagnosis not present

## 2012-08-08 DIAGNOSIS — M5030 Other cervical disc degeneration, unspecified cervical region: Secondary | ICD-10-CM | POA: Diagnosis not present

## 2012-08-08 DIAGNOSIS — M999 Biomechanical lesion, unspecified: Secondary | ICD-10-CM | POA: Diagnosis not present

## 2012-08-08 DIAGNOSIS — M9981 Other biomechanical lesions of cervical region: Secondary | ICD-10-CM | POA: Diagnosis not present

## 2012-08-08 DIAGNOSIS — M5137 Other intervertebral disc degeneration, lumbosacral region: Secondary | ICD-10-CM | POA: Diagnosis not present

## 2012-08-12 DIAGNOSIS — I4891 Unspecified atrial fibrillation: Secondary | ICD-10-CM | POA: Diagnosis not present

## 2012-08-26 DIAGNOSIS — M47816 Spondylosis without myelopathy or radiculopathy, lumbar region: Secondary | ICD-10-CM | POA: Insufficient documentation

## 2012-08-26 DIAGNOSIS — M5416 Radiculopathy, lumbar region: Secondary | ICD-10-CM | POA: Insufficient documentation

## 2012-08-26 DIAGNOSIS — IMO0002 Reserved for concepts with insufficient information to code with codable children: Secondary | ICD-10-CM | POA: Diagnosis not present

## 2012-08-26 DIAGNOSIS — M47817 Spondylosis without myelopathy or radiculopathy, lumbosacral region: Secondary | ICD-10-CM | POA: Diagnosis not present

## 2012-09-11 ENCOUNTER — Encounter: Payer: Self-pay | Admitting: Internal Medicine

## 2012-09-12 ENCOUNTER — Ambulatory Visit (INDEPENDENT_AMBULATORY_CARE_PROVIDER_SITE_OTHER): Payer: Medicare Other | Admitting: Pharmacist

## 2012-09-12 DIAGNOSIS — E785 Hyperlipidemia, unspecified: Secondary | ICD-10-CM | POA: Diagnosis not present

## 2012-09-12 DIAGNOSIS — E559 Vitamin D deficiency, unspecified: Secondary | ICD-10-CM

## 2012-09-12 DIAGNOSIS — Z139 Encounter for screening, unspecified: Secondary | ICD-10-CM | POA: Diagnosis not present

## 2012-09-12 DIAGNOSIS — I4891 Unspecified atrial fibrillation: Secondary | ICD-10-CM | POA: Diagnosis not present

## 2012-09-12 DIAGNOSIS — Z79899 Other long term (current) drug therapy: Secondary | ICD-10-CM

## 2012-09-12 LAB — POCT INR: INR: 2.5

## 2012-09-12 MED ORDER — WARFARIN SODIUM 2.5 MG PO TABS: 2.5000 mg | ORAL_TABLET | Freq: Every day | ORAL | Status: DC

## 2012-09-12 NOTE — Patient Instructions (Signed)
Anticoagulation Dose Instructions as of 09/12/2012     Jordan [name] Tue Wed Thu Fri Sat   New Dose 2.5 mg 2.5 mg 2.5 mg 2.5 mg 5 mg 2.5 mg 2.5 mg    Description       Continue current dose.  Recheck protime in 4 to 6 weeks.    Marland Kitchen

## 2012-09-25 ENCOUNTER — Other Ambulatory Visit: Payer: Self-pay

## 2012-09-25 MED ORDER — DILTIAZEM HCL ER COATED BEADS 180 MG PO CP24: 180.0000 mg | ORAL_CAPSULE | Freq: Every day | ORAL | Status: DC

## 2012-10-11 ENCOUNTER — Other Ambulatory Visit (INDEPENDENT_AMBULATORY_CARE_PROVIDER_SITE_OTHER): Payer: Medicare Other

## 2012-10-11 ENCOUNTER — Encounter: Payer: Self-pay | Admitting: Nurse Practitioner

## 2012-10-11 ENCOUNTER — Ambulatory Visit (INDEPENDENT_AMBULATORY_CARE_PROVIDER_SITE_OTHER): Payer: Medicare Other | Admitting: Nurse Practitioner

## 2012-10-11 VITALS — BP 108/68 | Temp 97.7°F | Ht 74.0 in | Wt 215.0 lb

## 2012-10-11 DIAGNOSIS — I4891 Unspecified atrial fibrillation: Secondary | ICD-10-CM

## 2012-10-11 DIAGNOSIS — E785 Hyperlipidemia, unspecified: Secondary | ICD-10-CM | POA: Diagnosis not present

## 2012-10-11 DIAGNOSIS — Z79899 Other long term (current) drug therapy: Secondary | ICD-10-CM

## 2012-10-11 DIAGNOSIS — Z139 Encounter for screening, unspecified: Secondary | ICD-10-CM | POA: Diagnosis not present

## 2012-10-11 LAB — COMPLETE METABOLIC PANEL WITH GFR
ALT: 25 U/L (ref 0–53)
BUN: 20 mg/dL (ref 6–23)
CO2: 28 mEq/L (ref 19–32)
Calcium: 8.6 mg/dL (ref 8.4–10.5)
Chloride: 107 mEq/L (ref 96–112)
Creat: 1.07 mg/dL (ref 0.50–1.35)
GFR, Est African American: 84 mL/min
GFR, Est Non African American: 72 mL/min
Glucose, Bld: 94 mg/dL (ref 70–99)

## 2012-10-11 LAB — THYROID PANEL WITH TSH: T3 Uptake: 40.1 % — ABNORMAL HIGH (ref 22.5–37.0)

## 2012-10-11 NOTE — Patient Instructions (Addendum)
Continue current dose and recheck in 4 weeks

## 2012-10-11 NOTE — Progress Notes (Signed)
  Subjective:    Patient ID: Jordan [name], male    DOB: 25-Dec-1945, 67 y.o.   MRN: [id number]  HPI    Review of Systems     Objective:   Physical Exam        Assessment & Plan:  Anti coag

## 2012-10-11 NOTE — Progress Notes (Signed)
Patient came in for labs only.

## 2012-10-12 DIAGNOSIS — Z7901 Long term (current) use of anticoagulants: Secondary | ICD-10-CM | POA: Insufficient documentation

## 2012-10-15 LAB — NMR LIPOPROFILE WITH LIPIDS
Cholesterol, Total: 110 mg/dL (ref <200)
HDL Particle Number: 28.2 umol/L — ABNORMAL LOW (ref >=30.5)
LP-IR Score: 36 (ref <=45)
Large HDL-P: 4.3 umol/L — ABNORMAL LOW (ref >=4.8)
Large VLDL-P: <0.8 nmol/L (ref <=2.7)
Small LDL Particle Number: 523 nmol/L (ref <=527)

## 2012-10-16 ENCOUNTER — Ambulatory Visit (INDEPENDENT_AMBULATORY_CARE_PROVIDER_SITE_OTHER): Payer: Medicare Other | Admitting: Family Medicine

## 2012-10-16 ENCOUNTER — Encounter: Payer: Self-pay | Admitting: Family Medicine

## 2012-10-16 VITALS — BP 109/65 | HR 71 | Temp 97.9°F | Ht 74.0 in | Wt 214.0 lb

## 2012-10-16 DIAGNOSIS — I4891 Unspecified atrial fibrillation: Secondary | ICD-10-CM

## 2012-10-16 DIAGNOSIS — Z87898 Personal history of other specified conditions: Secondary | ICD-10-CM

## 2012-10-16 DIAGNOSIS — Z23 Encounter for immunization: Secondary | ICD-10-CM | POA: Diagnosis not present

## 2012-10-16 DIAGNOSIS — I2510 Atherosclerotic heart disease of native coronary artery without angina pectoris: Secondary | ICD-10-CM | POA: Diagnosis not present

## 2012-10-16 DIAGNOSIS — Z7901 Long term (current) use of anticoagulants: Secondary | ICD-10-CM

## 2012-10-16 DIAGNOSIS — Z86010 Personal history of colonic polyps: Secondary | ICD-10-CM

## 2012-10-16 DIAGNOSIS — E785 Hyperlipidemia, unspecified: Secondary | ICD-10-CM

## 2012-10-16 MED ORDER — WARFARIN SODIUM 5 MG PO TABS: 2.5000 mg | ORAL_TABLET | Freq: Every day | ORAL | Status: DC

## 2012-10-16 MED ORDER — ATORVASTATIN CALCIUM 80 MG PO TABS: 40.0000 mg | ORAL_TABLET | Freq: Every day | ORAL | Status: DC

## 2012-10-16 NOTE — Progress Notes (Signed)
  Subjective:    Patient ID: Jordan [name], male    DOB: [birth year], 67 y.o.   MRN: [id number]  HPI This patient presents for recheck of multiple medical problems. No one accompanies the patient today.  Patient Active Problem List   Diagnosis Date Noted  . Long term (current) use of anticoagulants 10/12/2012  . CAD (coronary artery disease)   . Ejection fraction   . Atrial fibrillation   . Warfarin anticoagulation   . Drug intolerance   . Incomplete RBBB   . Carotid bruit   . Knee pain   . Anxiety   . Dyslipidemia   . Palpitations   . COLONIC POLYPS, HX OF 02/06/2009  . BENIGN PROSTATIC HYPERTROPHY, HX OF 02/06/2009  . POLYPECTOMY, HX OF 02/06/2009    In addition, patient will see Dr. Elenore Paddy for a possible repeat colonoscopy this coming Monday. He will also be seeing Dr. Annabell Howells in for a followup of his PSA in a couple weeks.  The allergies, current medications, past medical history, surgical history, family and social history are reviewed.  Immunizations reviewed.  Health maintenance reviewed.  The following items are outstanding: Pneumovax, colonoscopy     Review of Systems  Constitutional: Negative.   HENT: Negative.   Eyes: Negative.   Respiratory: Negative.   Cardiovascular: Negative.   Gastrointestinal: Negative.   Genitourinary: Negative.   Musculoskeletal: Negative.   Skin: Negative.   Allergic/Immunologic: Negative.   Neurological: Negative.   Psychiatric/Behavioral: Negative.    Patient exercises regularly and has no problems with this  with his heart or his breathing. Recent labs reviewed with patient today.    Objective:   Physical Exam BP 109/65  Pulse 71  Temp(Src) 97.9 F (36.6 C) (Oral)  Ht 6\' 2"  (1.88 m)  Wt 214 lb (97.07 kg)  BMI 27.46 kg/m2  The patient appeared well nourished and normally developed, alert and oriented to time and place. Speech, behavior and judgement appear normal. Vital signs as documented.  Head exam is  unremarkable. No scleral icterus or pallor noted. Ears and throat normal  Neck is without jugular venous distension, thyromegally, or carotid bruits. Carotid upstrokes are brisk bilaterally. No cervical adenopathy. Lungs are clear anteriorly and posteriorly to auscultation. Normal respiratory effort. Cardiac exam reveals regular rate and rhythm at 60 per minute. First and second heart sounds normal.  No murmurs, rubs or gallops.  Abdominal exam reveals normal bowl sounds, no masses, no organomegaly and no aortic enlargement. No inguinal adenopathy. Extremities are nonedematous and both femoral and pedal pulses are normal. Skin without pallor or jaundice.  Warm and dry, without rash. Neurologic exam reveals normal deep tendon reflexes and normal sensation.          Assessment & Plan:  '1. Atrial fibrillation  2. BENIGN PROSTATIC HYPERTROPHY, HX OF  3. CAD (coronary artery disease)  4. COLONIC POLYPS, HX OF  5. hyperlipidemia  6. Warfarin anticoagulation   Patient Instructions  Fall precautions discussed Continue current meds and therapeutic lifestyle changes Continue to exercise regularly Followup with Dr.Wrenn, Dr. Leone Payor, and Dr. Myrtis Ser as planned

## 2012-10-16 NOTE — Patient Instructions (Addendum)
Fall precautions discussed Continue current meds and therapeutic lifestyle changes Continue to exercise regularly Followup with Dr.Wrenn, Dr. Leone Payor, and Dr. Myrtis Ser as planned

## 2012-10-21 ENCOUNTER — Ambulatory Visit (INDEPENDENT_AMBULATORY_CARE_PROVIDER_SITE_OTHER): Payer: Medicare Other | Admitting: Internal Medicine

## 2012-10-21 ENCOUNTER — Other Ambulatory Visit (INDEPENDENT_AMBULATORY_CARE_PROVIDER_SITE_OTHER): Payer: Medicare Other

## 2012-10-21 ENCOUNTER — Encounter: Payer: Self-pay | Admitting: Internal Medicine

## 2012-10-21 ENCOUNTER — Telehealth: Payer: Self-pay

## 2012-10-21 VITALS — BP 110/68 | HR 72 | Ht 74.0 in | Wt 215.6 lb

## 2012-10-21 DIAGNOSIS — Z1211 Encounter for screening for malignant neoplasm of colon: Secondary | ICD-10-CM | POA: Diagnosis not present

## 2012-10-21 DIAGNOSIS — Z860100 Personal history of colon polyps, unspecified: Secondary | ICD-10-CM

## 2012-10-21 DIAGNOSIS — E739 Lactose intolerance, unspecified: Secondary | ICD-10-CM | POA: Diagnosis not present

## 2012-10-21 DIAGNOSIS — I4891 Unspecified atrial fibrillation: Secondary | ICD-10-CM

## 2012-10-21 DIAGNOSIS — K589 Irritable bowel syndrome without diarrhea: Secondary | ICD-10-CM

## 2012-10-21 LAB — IGA: IgA: 240 mg/dL (ref 68–378)

## 2012-10-21 MED ORDER — MOVIPREP 100 G PO SOLR: ORAL | Status: DC

## 2012-10-21 NOTE — Telephone Encounter (Signed)
The patient's Coumadin can be held for 7 days prior to his GI procedure.

## 2012-10-21 NOTE — Progress Notes (Signed)
  Subjective:    Patient ID: Jordan [name], male    DOB: [birth year], 67 y.o.   MRN: [id number]  HPI patient is here for screening and surveillance given a history of adenomatous colon polyps. He also has a history of IBS. His current problems include: Episodic diarrhea and gas/bloating Imodium AD Plus helps Milk products, spicy foods, salads trigger. When he has eliminated milk from oatmeal and other foods derivatives ice cream he does not have problems but his problems are not exclusive to those foods. Has tried Lactaid, helps   He does take warfarin chronically for atrial fibrillation and stroke prevention.  Medications, allergies, past medical history, past surgical history, family history and social history are reviewed and updated in the EMR. Review of Systems Otherwise appears to be doing well.    Objective:   Physical Exam General:  NAD Eyes:   anicteric Lungs:  clear Heart:  S1S2 no rubs, murmurs or gallops Abdomen:  soft and nontender, BS+     Assessment & Plan:  IBS (irritable bowel syndrome)  Lactose intolerance suspect  Special screening for malignant neoplasms, colon  Personal history of colonic polyps  Atrial fibrillation on warfarin  1. Celiac testing 2. Lactose intolerance handout 3. Schedule colonoscopy off warfarin 3-5 days The risks and benefits as well as alternatives of endoscopic procedure(s) have been discussed and reviewed. All questions answered. The patient agrees to proceed. Extra risk of possible stroke off warfarin explained also

## 2012-10-21 NOTE — Telephone Encounter (Signed)
[facility] [address]  [place] [address]  10/21/2012    RE: RAMESES OU DOB: 1946-05-21 MRN: [id number]   Dear Dr. Willa Rough,    We have scheduled the above patient for an endoscopic procedure. Our records show that he is on anticoagulation therapy.   Please advise as to how long the patient may come off his therapy of warfarin prior to the colonoscopy procedure, which is scheduled for 11/07/12.  Please fax back/ or route the completed form to Chrystie Hagwood [place], CMA at [phone].   Sincerely,  [place], Clayborne Dana

## 2012-10-21 NOTE — Patient Instructions (Addendum)
You have been scheduled for a colonoscopy with propofol. Please follow written instructions given to you at your visit today.  Please pick up your prep kit at the pharmacy within the next 1-3 days.  Use your coupon to get a free one. If you use inhalers (even only as needed), please bring them with you on the day of your procedure. Your physician has requested that you go to www.startemmi.com and enter the access code given to you at your visit today. This web site gives a general overview about your procedure. However, you should still follow specific instructions given to you by our office regarding your preparation for the procedure.  You will be contaced by our office prior to your procedure for directions on holding your Coumadin/Warfarin.  If you do not hear from our office 1 week prior to your scheduled procedure, please call [phone] to discuss.  Your physician has requested that you go to the basement for the following lab work before leaving today: TTG, IGA  Today you have been given a lactose intolerant handout to read and follow.  Thank you for choosing me and [facility].  Iva Boop, M.D., [facility]

## 2012-10-21 NOTE — Telephone Encounter (Signed)
**Note De-Identified [place] [facility]** Note faxed to Patti [place], CMA.

## 2012-10-22 NOTE — Telephone Encounter (Signed)
Left message on mobile # [phone] to call me back to discuss holding warfarin for upcoming procedure.

## 2012-10-22 NOTE — Telephone Encounter (Signed)
Spoke with Jordan [name] and informed him to hold his warfarin 7 days prior to his colonoscopy which is on 11/07/12.  He verbalized understanding.

## 2012-10-30 DIAGNOSIS — R972 Elevated prostate specific antigen [PSA]: Secondary | ICD-10-CM | POA: Diagnosis not present

## 2012-10-31 ENCOUNTER — Encounter: Payer: Self-pay | Admitting: Internal Medicine

## 2012-10-31 NOTE — Progress Notes (Signed)
Quick Note:  Negative for celiac disease ______ 

## 2012-11-06 DIAGNOSIS — N401 Enlarged prostate with lower urinary tract symptoms: Secondary | ICD-10-CM | POA: Diagnosis not present

## 2012-11-06 DIAGNOSIS — N529 Male erectile dysfunction, unspecified: Secondary | ICD-10-CM | POA: Diagnosis not present

## 2012-11-06 DIAGNOSIS — R972 Elevated prostate specific antigen [PSA]: Secondary | ICD-10-CM | POA: Diagnosis not present

## 2012-11-07 ENCOUNTER — Ambulatory Visit (AMBULATORY_SURGERY_CENTER): Payer: Medicare Other | Admitting: Internal Medicine

## 2012-11-07 ENCOUNTER — Encounter: Payer: Self-pay | Admitting: Internal Medicine

## 2012-11-07 VITALS — BP 107/60 | HR 59 | Temp 97.5°F | Resp 20 | Ht 74.0 in | Wt 215.0 lb

## 2012-11-07 DIAGNOSIS — I2510 Atherosclerotic heart disease of native coronary artery without angina pectoris: Secondary | ICD-10-CM | POA: Diagnosis not present

## 2012-11-07 DIAGNOSIS — Z86010 Personal history of colonic polyps: Secondary | ICD-10-CM | POA: Diagnosis not present

## 2012-11-07 DIAGNOSIS — Z1211 Encounter for screening for malignant neoplasm of colon: Secondary | ICD-10-CM | POA: Diagnosis not present

## 2012-11-07 DIAGNOSIS — D126 Benign neoplasm of colon, unspecified: Secondary | ICD-10-CM

## 2012-11-07 DIAGNOSIS — I4891 Unspecified atrial fibrillation: Secondary | ICD-10-CM | POA: Diagnosis not present

## 2012-11-07 MED ORDER — SODIUM CHLORIDE 0.9 % IV SOLN: 500.0000 mL | INTRAVENOUS | Status: DC

## 2012-11-07 NOTE — Progress Notes (Signed)
Called to room to assist during endoscopic procedure.  Patient ID and intended procedure confirmed with present staff. Received instructions for my participation in the procedure from the performing physician.  

## 2012-11-07 NOTE — Progress Notes (Signed)
A/Ox3, VSS, Report to RN. No complaints voiced

## 2012-11-07 NOTE — Patient Instructions (Addendum)
I found and removed 6 polyps - they all look benign. You also have small internal hemorrhoids.  I will let you know pathology results and when to have another routine colonoscopy by mail.  I appreciate the opportunity to care for you.  Iva Boop, MD, FACG  YOU HAD AN ENDOSCOPIC PROCEDURE TODAY AT THE [facility]: Refer to the procedure report that was given to you for any specific questions about what was found during the examination.  If the procedure report does not answer your questions, please call your gastroenterologist to clarify.  If you requested that your care partner not be given the details of your procedure findings, then the procedure report has been included in a sealed envelope for you to review at your convenience later.  YOU SHOULD EXPECT: Some feelings of bloating in the abdomen. Passage of more gas than usual.  Walking can help get rid of the air that was put into your GI tract during the procedure and reduce the bloating. If you had a lower endoscopy (such as a colonoscopy or flexible sigmoidoscopy) you may notice spotting of blood in your stool or on the toilet paper. If you underwent a bowel prep for your procedure, then you may not have a normal bowel movement for a few days.  DIET: Your first meal following the procedure should be a light meal and then it is ok to progress to your normal diet.  A half-sandwich or bowl of soup is an example of a good first meal.  Heavy or fried foods are harder to digest and may make you feel nauseous or bloated.  Likewise meals heavy in dairy and vegetables can cause extra gas to form and this can also increase the bloating.  Drink plenty of fluids but you should avoid alcoholic beverages for 24 hours.  ACTIVITY: Your care partner should take you home directly after the procedure.  You should plan to take it easy, moving slowly for the rest of the day.  You can resume normal activity the day after the procedure however you  should NOT DRIVE or use heavy machinery for 24 hours (because of the sedation medicines used during the test).    SYMPTOMS TO REPORT IMMEDIATELY: A gastroenterologist can be reached at any hour.  During normal business hours, 8:30 AM to 5:00 PM Monday through Friday, call [phone].  After hours and on weekends, please call the GI answering service at [phone] who will take a message and have the physician on call contact you.   Following lower endoscopy (colonoscopy or flexible sigmoidoscopy):  Excessive amounts of blood in the stool  Significant tenderness or worsening of abdominal pains  Swelling of the abdomen that is new, acute  Fever of 100F or higher  FOLLOW UP: If any biopsies were taken you will be contacted by phone or by letter within the next 1-3 weeks.  Call your gastroenterologist if you have not heard about the biopsies in 3 weeks.  Our staff will call the home number listed on your records the next business day following your procedure to check on you and address any questions or concerns that you may have at that time regarding the information given to you following your procedure. This is a courtesy call and so if there is no answer at the home number and we have not heard from you through the emergency physician on call, we will assume that you have returned to your regular daily activities without incident.  SIGNATURES/CONFIDENTIALITY: You and/or your care partner have signed paperwork which will be entered into your electronic medical record.  These signatures attest to the fact that that the information above on your After Visit Summary has been reviewed and is understood.  Full responsibility of the confidentiality of this discharge information lies with you and/or your care-partner.  Polyps, hemorrhoids-handouts given  Clip card given  Repeat colonoscopy will be determined by pathology  Resume warfarin and get INR checked within 1 week

## 2012-11-07 NOTE — Op Note (Addendum)
[facility] [address] [place], [address]   COLONOSCOPY PROCEDURE REPORT  PATIENT: Lynn, Sissel  MR#: [id number] BIRTHDATE: [birth year] , 66  yrs. old GENDER: Male ENDOSCOPIST: Iva Boop, MD, [facility] PROCEDURE DATE:  11/07/2012 PROCEDURE:   Colonoscopy with biopsy and snare polypectomy ASA CLASS:   Class II INDICATIONS:Screening and surveillance,personal history of colonic polyps and Last colonoscopy performed 2008. MEDICATIONS: propofol (Diprivan) 250mg  IV, MAC sedation, administered by CRNA, and These medications were titrated to patient response per physician's verbal order  DESCRIPTION OF PROCEDURE:   After the risks benefits and alternatives of the procedure were thoroughly explained, informed consent was obtained.  A digital rectal exam revealed no prostatic nodules.   The [id number]  endoscope was introduced through the anus and advanced to the cecum, which was identified by both the appendix and ileocecal valve. No adverse events experienced.   The quality of the prep was adequate, using MoviPrep The instrument was then slowly withdrawn as the colon was fully examined.      COLON FINDINGS: Six sessile polyps measuring 3-8 mm in size were found in the right colon (2 hepatic flexure and 4 transverse)  A polypectomy was performed with cold forceps(3 mm polyp)and with a cold snare (all others).  The resection was complete and the polyp tissue was completely retrieved.There was bleeding after removing the 8 mm polyp and tip cautery and then 1 clip applied (after 1 misfire) and bleeding stopped.    The colon mucosa was otherwise normal.   A right colon retroflexion was performed.  Retroflexed rectal views revealed internal hemorrhoids. The time to cecum=3 minutes 04 seconds.  Withdrawal time=31 minutes 05 seconds.  The scope was withdrawn and the procedure completed. COMPLICATIONS: There were no complications.   ENDOSCOPIC  IMPRESSION: 1.   Six sessile polyps measuring 3-8 mm in size were found in the right colon; polypectomy was performed with cold forceps and with a cold snare 2.   The colon mucosa was otherwise normal - adequate prep 3.   Internal hemorrhoids in rectum 4.   Prior adenomas 2006 and 2008  RECOMMENDATIONS: 1.  Timing of repeat colonoscopy will be determined by pathology findings. 2.   resume warfarin and get INR checked within 1 week   eSigned:  Iva Boop, MD, [facility] 11/14/2012 5:01 PM Revised: 11/14/2012 5:01 PM  cc: The Patient   PATIENT NAME:  Fortunato, Nordin MR#: [id number]

## 2012-11-07 NOTE — Progress Notes (Signed)
Patient did not experience any of the following events: a burn prior to discharge; a fall within the facility; wrong site/side/patient/procedure/implant event; or a hospital transfer or hospital admission upon discharge from the facility. (G8907) Patient did not have preoperative order for IV antibiotic SSI prophylaxis. (G8918)  

## 2012-11-08 ENCOUNTER — Telehealth: Payer: Self-pay | Admitting: *Deleted

## 2012-11-08 NOTE — Telephone Encounter (Signed)
  Follow up Call-  Call back number 11/07/2012  Post procedure Call Back phone  # [phone]  Permission to leave phone message Yes     Patient questions:  Do you have a fever, pain , or abdominal swelling? no Pain Score  0 *  Have you tolerated food without any problems? yes  Have you been able to return to your normal activities? yes  Do you have any questions about your discharge instructions: Diet   no Medications  no Follow up visit  no  Do you have questions or concerns about your Care? no  Actions: * If pain score is 4 or above: No action needed, pain <4.

## 2012-11-14 ENCOUNTER — Encounter: Payer: Self-pay | Admitting: Internal Medicine

## 2012-11-14 ENCOUNTER — Ambulatory Visit (INDEPENDENT_AMBULATORY_CARE_PROVIDER_SITE_OTHER): Payer: Medicare Other | Admitting: Pharmacist

## 2012-11-14 DIAGNOSIS — I4891 Unspecified atrial fibrillation: Secondary | ICD-10-CM | POA: Diagnosis not present

## 2012-11-14 DIAGNOSIS — Z7901 Long term (current) use of anticoagulants: Secondary | ICD-10-CM

## 2012-11-14 LAB — POCT INR: INR: 1.9

## 2012-11-14 NOTE — Patient Instructions (Addendum)
Anticoagulation Dose Instructions as of 11/14/2012     Jordan [name] Tue Wed Thu Fri Sat   New Dose 2.5 mg 2.5 mg 2.5 mg 2.5 mg 5 mg 2.5 mg 2.5 mg    Description       Continue current dose.  Recheck protime in 4 to 6 weeks.       INR was 1.9

## 2012-11-14 NOTE — Progress Notes (Signed)
Quick Note:  6 adenomas max 8 mm Repeat colonoscopy 2017 ______

## 2012-11-29 DIAGNOSIS — M4712 Other spondylosis with myelopathy, cervical region: Secondary | ICD-10-CM | POA: Diagnosis not present

## 2012-11-29 DIAGNOSIS — M999 Biomechanical lesion, unspecified: Secondary | ICD-10-CM | POA: Diagnosis not present

## 2012-11-29 DIAGNOSIS — M47817 Spondylosis without myelopathy or radiculopathy, lumbosacral region: Secondary | ICD-10-CM | POA: Diagnosis not present

## 2012-11-29 DIAGNOSIS — M9981 Other biomechanical lesions of cervical region: Secondary | ICD-10-CM | POA: Diagnosis not present

## 2012-11-29 DIAGNOSIS — M546 Pain in thoracic spine: Secondary | ICD-10-CM | POA: Diagnosis not present

## 2012-12-10 ENCOUNTER — Ambulatory Visit (INDEPENDENT_AMBULATORY_CARE_PROVIDER_SITE_OTHER): Payer: Medicare Other | Admitting: Physician Assistant

## 2012-12-10 DIAGNOSIS — T6391XA Toxic effect of contact with unspecified venomous animal, accidental (unintentional), initial encounter: Secondary | ICD-10-CM

## 2012-12-10 DIAGNOSIS — T63461A Toxic effect of venom of wasps, accidental (unintentional), initial encounter: Secondary | ICD-10-CM

## 2012-12-10 NOTE — Progress Notes (Signed)
Subjective:     Patient ID: Jordan [name], male   DOB: 1945-11-24, 67 y.o.   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       Objective:   Physical Exam + erythema to the medial forearm with some edema No vesicle or ulceration seen Heart- RRR w/o M at timeof exam Lungs- CTA Good pulses at R wrist    Assessment:     Bee sting    Plan:     OTC Claritin Cool compresses F/U prn

## 2012-12-10 NOTE — Patient Instructions (Signed)
Bee, Wasp, or Hornet Sting °Your caregiver has diagnosed you as having an insect sting. An insect sting appears as a red lump in the skin that sometimes has a tiny hole in the center, or it may have a stinger in the center of the wound. The most common stings are from wasps, hornets and bees. °Individuals have different reactions to insect stings. °· A normal reaction may cause pain, swelling, and redness around the sting site. °· A localized allergic reaction may cause swelling and redness that extends beyond the sting site. °· A large local reaction may continue to develop over the next 12 to 36 hours. °· On occasion, the reactions can be severe (anaphylactic reaction). An anaphylactic reaction may cause wheezing; difficulty breathing; chest pain; fainting; raised, itchy, red patches on the skin; a sick feeling to your stomach (nausea); vomiting; cramping; or diarrhea. If you have had an anaphylactic reaction to an insect sting in the past, you are more likely to have one again. °HOME CARE INSTRUCTIONS  °· With bee stings, a small sac of poison is left in the wound. Brushing across this with something such as a credit card, or anything similar, will help remove this and decrease the amount of the reaction. This same procedure will not help a wasp sting as they do not leave behind a stinger and poison sac. °· Apply a cold compress for 10 to 20 minutes every hour for 1 to 2 days, depending on severity, to reduce swelling and itching. °· To lessen pain, a paste made of water and baking soda may be rubbed on the bite or sting and left on for 5 minutes. °· To relieve itching and swelling, you may use take medication or apply medicated creams or lotions as directed. °· Only take over-the-counter or prescription medicines for pain, discomfort, or fever as directed by your caregiver. °· Wash the sting site daily with soap and water. Apply antibiotic ointment on the sting site as directed. °· If you suffered a severe  reaction: °· If you did not require hospitalization, an adult will need to stay with you for 24 hours in case the symptoms return. °· You may need to wear a medical bracelet or necklace stating the allergy. °· You and your family need to learn when and how to use an anaphylaxis kit or epinephrine injection. °· If you have had a severe reaction before, always carry your anaphylaxis kit with you. °SEEK MEDICAL CARE IF:  °· None of the above helps within 2 to 3 days. °· The area becomes red, warm, tender, and swollen beyond the area of the bite or sting. °· You have an oral temperature above 102° F (38.9° C). °SEEK IMMEDIATE MEDICAL CARE IF:  °You have symptoms of an allergic reaction which are: °· Wheezing. °· Difficulty breathing. °· Chest pain. °· Lightheadedness or fainting. °· Itchy, raised, red patches on the skin. °· Nausea, vomiting, cramping or diarrhea. °ANY OF THESE SYMPTOMS MAY REPRESENT A SERIOUS PROBLEM THAT IS AN EMERGENCY. Do not wait to see if the symptoms will go away. Get medical help right away. Call your local emergency services (911 in [place] DO NOT drive yourself to the hospital. °MAKE SURE YOU:  °· Understand these instructions. °· Will watch your condition. °· Will get help right away if you are not doing well or get worse. °Document Released: 05/29/2005 Document Revised: 08/21/2011 Document Reviewed: 11/13/2009 °ExitCare® Patient Information ©2014 [employer]. ° °

## 2013-01-09 ENCOUNTER — Telehealth: Payer: Self-pay | Admitting: Pharmacist

## 2013-01-09 NOTE — Telephone Encounter (Signed)
Missed last protime appt - need to reschedule. appt made for 01/13/13 for 10:20 am Patient notified

## 2013-01-13 ENCOUNTER — Ambulatory Visit (INDEPENDENT_AMBULATORY_CARE_PROVIDER_SITE_OTHER): Payer: Medicare Other | Admitting: Pharmacist

## 2013-01-13 DIAGNOSIS — Z7901 Long term (current) use of anticoagulants: Secondary | ICD-10-CM | POA: Diagnosis not present

## 2013-01-13 DIAGNOSIS — I4891 Unspecified atrial fibrillation: Secondary | ICD-10-CM

## 2013-01-13 MED ORDER — WARFARIN SODIUM 5 MG PO TABS: 2.5000 mg | ORAL_TABLET | Freq: Every day | ORAL | Status: DC

## 2013-01-13 NOTE — Patient Instructions (Signed)
Anticoagulation Dose Instructions as of 01/13/2013     Jordan [name] Tue Wed Thu Fri Sat   New Dose 2.5 mg 2.5 mg 2.5 mg 2.5 mg 5 mg 2.5 mg 2.5 mg    Description       Take 1 tablet today and tomorrow, then restart 1/2 tablet daily except 1 tablet on thursdays      INR was 1.7 today

## 2013-02-06 ENCOUNTER — Ambulatory Visit (INDEPENDENT_AMBULATORY_CARE_PROVIDER_SITE_OTHER): Payer: Medicare Other | Admitting: Pharmacist

## 2013-02-06 DIAGNOSIS — Z7901 Long term (current) use of anticoagulants: Secondary | ICD-10-CM

## 2013-02-06 DIAGNOSIS — I4891 Unspecified atrial fibrillation: Secondary | ICD-10-CM

## 2013-02-06 NOTE — Patient Instructions (Signed)
Anticoagulation Dose Instructions as of 02/06/2013     Jordan [name] Tue Wed Thu Fri Sat   New Dose 2.5 mg 2.5 mg 2.5 mg 2.5 mg 5 mg 2.5 mg 2.5 mg    Description       Continue 1/2 tablet daily except 1 tablet on thursdays      INR was 2.1 today

## 2013-02-23 ENCOUNTER — Encounter (HOSPITAL_COMMUNITY): Payer: Self-pay | Admitting: Unknown Physician Specialty

## 2013-02-23 ENCOUNTER — Emergency Department (HOSPITAL_COMMUNITY): Payer: Medicare Other

## 2013-02-23 ENCOUNTER — Observation Stay (HOSPITAL_COMMUNITY)
Admission: EM | Admit: 2013-02-23 | Discharge: 2013-02-25 | Disposition: A | Discharge status: Home / Self Care | Payer: Medicare Other | Attending: Internal Medicine | Admitting: Internal Medicine

## 2013-02-23 DIAGNOSIS — R079 Chest pain, unspecified: Secondary | ICD-10-CM | POA: Diagnosis not present

## 2013-02-23 DIAGNOSIS — I2510 Atherosclerotic heart disease of native coronary artery without angina pectoris: Secondary | ICD-10-CM | POA: Insufficient documentation

## 2013-02-23 DIAGNOSIS — Z7901 Long term (current) use of anticoagulants: Secondary | ICD-10-CM | POA: Insufficient documentation

## 2013-02-23 DIAGNOSIS — I4891 Unspecified atrial fibrillation: Secondary | ICD-10-CM | POA: Diagnosis not present

## 2013-02-23 DIAGNOSIS — Z79899 Other long term (current) drug therapy: Secondary | ICD-10-CM | POA: Diagnosis not present

## 2013-02-23 DIAGNOSIS — E785 Hyperlipidemia, unspecified: Secondary | ICD-10-CM | POA: Diagnosis present

## 2013-02-23 DIAGNOSIS — Z9861 Coronary angioplasty status: Secondary | ICD-10-CM | POA: Diagnosis not present

## 2013-02-23 HISTORY — DX: Chest pain, unspecified: R07.9

## 2013-02-23 LAB — URINALYSIS, ROUTINE W REFLEX MICROSCOPIC
Bilirubin Urine: NEGATIVE
Ketones, ur: NEGATIVE mg/dL
Leukocytes, UA: NEGATIVE
Nitrite: NEGATIVE
Protein, ur: NEGATIVE mg/dL
Urobilinogen, UA: 0.2 mg/dL (ref 0.0–1.0)

## 2013-02-23 LAB — CBC
HCT: 43.1 % (ref 39.0–52.0)
Hemoglobin: 14.8 g/dL (ref 13.0–17.0)
MCHC: 34.3 g/dL (ref 30.0–36.0)
RBC: 4.8 MIL/uL (ref 4.22–5.81)

## 2013-02-23 LAB — PROTIME-INR: Prothrombin Time: 20.8 seconds — ABNORMAL HIGH (ref 11.6–15.2)

## 2013-02-23 LAB — COMPREHENSIVE METABOLIC PANEL
ALT: 25 U/L (ref 0–53)
Alkaline Phosphatase: 52 U/L (ref 39–117)
BUN: 21 mg/dL (ref 6–23)
CO2: 26 mEq/L (ref 19–32)
GFR calc Af Amer: 88 mL/min — ABNORMAL LOW (ref >90)
GFR calc non Af Amer: 76 mL/min — ABNORMAL LOW (ref >90)
Glucose, Bld: 97 mg/dL (ref 70–99)
Potassium: 4.1 mEq/L (ref 3.5–5.1)
Sodium: 139 mEq/L (ref 135–145)
Total Bilirubin: 0.4 mg/dL (ref 0.3–1.2)
Total Protein: 6.8 g/dL (ref 6.0–8.3)

## 2013-02-23 MED ORDER — ASPIRIN EC 325 MG PO TBEC
325.0000 mg | DELAYED_RELEASE_TABLET | Freq: Every day | ORAL | Status: DC
  Administered 2013-02-24 – 2013-02-25 (×2): 325 mg via ORAL
  Filled 2013-02-23 (×2): qty 1

## 2013-02-23 MED ORDER — ONDANSETRON HCL 4 MG PO TABS: 4.0000 mg | ORAL_TABLET | Freq: Four times a day (QID) | ORAL | Status: DC | PRN

## 2013-02-23 MED ORDER — WARFARIN SODIUM 6 MG PO TABS
6.0000 mg | ORAL_TABLET | ORAL | Status: AC
  Administered 2013-02-24: 6 mg via ORAL
  Filled 2013-02-23: qty 1

## 2013-02-23 MED ORDER — SODIUM CHLORIDE 0.9 % IJ SOLN
3.0000 mL | Freq: Two times a day (BID) | INTRAMUSCULAR | Status: DC
  Administered 2013-02-24: 3 mL via INTRAVENOUS

## 2013-02-23 MED ORDER — NITROGLYCERIN 0.4 MG SL SUBL: 0.4000 mg | SUBLINGUAL_TABLET | SUBLINGUAL | Status: DC | PRN

## 2013-02-23 MED ORDER — ACETAMINOPHEN 650 MG RE SUPP: 650.0000 mg | Freq: Four times a day (QID) | RECTAL | Status: DC | PRN

## 2013-02-23 MED ORDER — ONDANSETRON HCL 4 MG/2ML IJ SOLN: 4.0000 mg | Freq: Four times a day (QID) | INTRAMUSCULAR | Status: DC | PRN

## 2013-02-23 MED ORDER — VITAMIN D 1000 UNITS PO TABS
1000.0000 [IU] | ORAL_TABLET | Freq: Every day | ORAL | Status: DC
  Administered 2013-02-24 – 2013-02-25 (×2): 1000 [IU] via ORAL
  Filled 2013-02-23 (×2): qty 1

## 2013-02-23 MED ORDER — ACETAMINOPHEN 325 MG PO TABS: 650.0000 mg | ORAL_TABLET | Freq: Four times a day (QID) | ORAL | Status: DC | PRN

## 2013-02-23 MED ORDER — INFLUENZA VAC SPLIT QUAD 0.5 ML IM SUSP
0.5000 mL | INTRAMUSCULAR | Status: AC
  Administered 2013-02-24: 0.5 mL via INTRAMUSCULAR
  Filled 2013-02-23: qty 0.5

## 2013-02-23 MED ORDER — DILTIAZEM HCL ER COATED BEADS 180 MG PO CP24
180.0000 mg | ORAL_CAPSULE | Freq: Every day | ORAL | Status: DC
  Administered 2013-02-24 – 2013-02-25 (×2): 180 mg via ORAL
  Filled 2013-02-23 (×3): qty 1

## 2013-02-23 MED ORDER — SODIUM CHLORIDE 0.9 % IJ SOLN: 3.0000 mL | Freq: Two times a day (BID) | INTRAMUSCULAR | Status: DC

## 2013-02-23 MED ORDER — NIACIN ER (ANTIHYPERLIPIDEMIC) 500 MG PO TBCR
1000.0000 mg | EXTENDED_RELEASE_TABLET | Freq: Every day | ORAL | Status: DC
  Administered 2013-02-24: 1000 mg via ORAL
  Filled 2013-02-23 (×2): qty 2

## 2013-02-23 MED ORDER — OMEGA-3 FATTY ACIDS 1000 MG PO CAPS: 1.0000 g | ORAL_CAPSULE | Freq: Every day | ORAL | Status: DC

## 2013-02-23 MED ORDER — OMEGA-3-ACID ETHYL ESTERS 1 G PO CAPS
1.0000 g | ORAL_CAPSULE | Freq: Every day | ORAL | Status: DC
  Administered 2013-02-24 – 2013-02-25 (×2): 1 g via ORAL
  Filled 2013-02-23 (×2): qty 1

## 2013-02-23 MED ORDER — ATORVASTATIN CALCIUM 40 MG PO TABS
40.0000 mg | ORAL_TABLET | Freq: Every day | ORAL | Status: DC
  Administered 2013-02-24 (×2): 40 mg via ORAL
  Filled 2013-02-23 (×3): qty 1

## 2013-02-23 MED ORDER — WARFARIN - PHARMACIST DOSING INPATIENT: Freq: Every day | Status: DC

## 2013-02-23 NOTE — Progress Notes (Signed)
ANTICOAGULATION CONSULT NOTE - Initial Consult  Pharmacy Consult for Coumadin Indication: atrial fibrillation  Allergies  Allergen Reactions  . Levofloxacin Other (See Comments)    Couldn't breathe, passed out  . Lopid [Gemfibrozil] Other (See Comments)    Loose bowels  . Sulfonamide Derivatives Hives    Vital Signs: Temp: 98.3 F (36.8 C) (09/14 1836) BP: 109/62 mmHg (09/14 2215) Pulse Rate: 55 (09/14 2215)  Labs:  Recent Labs  02/23/13 1900 02/23/13 1930  HGB 14.8  --   HCT 43.1  --   PLT 180  --   LABPROT  --  20.8*  INR  --  1.85*  CREATININE 1.01  --      Medical History: Past Medical History  Diagnosis Date  . CAD (coronary artery disease)     Stent LAD, 2002  /  nuclear 2009, no ischemia  /    [facility] October, 2011 nuclear, no ischemia, possible slight apical scar, ejection fraction 70%  . Ejection fraction     EF 65-70%, echo, 2007 /  EF 70%, nuclear, 2011  . Atrial fibrillation     Paroxysmal  . Warfarin anticoagulation     Low CHADS , score, but patient wants to be aggressive  . Drug intolerance     Mild beta blocker intolerance  . Incomplete RBBB   . Syncope     November, 2010  . Carotid bruit     Doppler November, 2011, normal  . Knee pain     November, 201  . BPH (benign prostatic hyperplasia)   . Back pain   . Anxiety     Mild  . Dyslipidemia   . Colon polyps   . Palpitations   . IBS (irritable bowel syndrome)   . Personal history of colonic adenomas 11/29/2006    Qualifier: Diagnosis of  By: Genelle Gather CMA, [place]      Assessment: 67yo male c/o sharp CP associated w/ diaphoresis, initial troponin negative, now CP-free, admitted for observation of troponin, to continue Coumadin for Afib; admitted with subtherapeutic INR.  Goal of Therapy:  INR 2-3   Plan:  Will give boosted dose of Coumadin 6mg  po x1 now and monitor INR for dose adjustments.  Vernard Gambles, PharmD, BCPS  02/23/2013,10:34 PM

## 2013-02-23 NOTE — ED Notes (Addendum)
Patient arrived via [facility] from home with chest pain that started apprx noon today. Pain is described as sharp shooting pain, denies shortness of breath but had diaphoresis. Patient stated he waited to come to the ER because he started to become hot feeling and did feel right. EMS states patient received Asprin 324mg  only.

## 2013-02-23 NOTE — ED Provider Notes (Signed)
CSN: [id number]     Arrival date & time 02/23/13  1829 History   First MD Initiated Contact with Patient 02/23/13 1833     Chief Complaint  Patient presents with  . Chest Pain   (Consider location/radiation/quality/duration/timing/severity/associated sxs/prior Treatment) Patient is a 67 y.o. male presenting with chest pain. The history is provided by the patient.  Chest Pain Pain location:  L chest Pain quality: sharp   Pain radiates to:  Does not radiate Pain severity:  Moderate Onset quality:  Sudden Duration: seconds. Timing:  Sporadic Progression:  Unchanged Chronicity:  New Context: at rest   Context: not breathing, no drug use, not eating, no intercourse, not lifting, no movement, not raising an arm, no stress and no trauma   Relieved by:  Nothing Ineffective treatments:  Aspirin Associated symptoms: anxiety and weakness   Associated symptoms: no abdominal pain, no AICD problem, no altered mental status, no anorexia, no back pain, no claudication, no cough, no diaphoresis, no dizziness, no dysphagia, no fatigue, no fever, no headache, no heartburn, no lower extremity edema, no nausea, no near-syncope, no numbness, no orthopnea, no palpitations, no PND, no shortness of breath, no syncope and not vomiting   Risk factors: coronary artery disease, high cholesterol and male sex    67 year old male with a past medical history of coronary artery disease, previous MI and stent placement, paroxysmal atrial fibrillation presents with chief complaint of chest pain.  Patient states that today while sitting in church service he had sudden onset sharp, fleeting left-sided chest pain.  He said it occurred intermittently.  Later on in the day he was fixing a doorknob when he began having the same chest pains, he said he began feeling weak and dizzy consistent with his usual atrial fibrillation.  The patient has diltiazem but did not have relief of his symptoms.  He went to lay down and took 325 mg  aspirin.  Patient continued to have intermittent sharp chest pains or fleeting in nature.  The patient took his blood pressure which was 140/90 at home.  Patient states his pressures normally run low which prompted his concern and came to the hospital for evaluation the  Pain is nonexertional and nonpleuritic. Denies DOE, SOB, chest tightness or pressure, radiation to left arm, jaw or back, nausea, or diaphoresis. Does not feel like his previous MI. Denies fevers, chills, myalgias, arthralgias.  Denies dysuria, flank pain, suprapubic pain, frequency, urgency, or hematuria. Denies headaches, light headedness, weakness, visual disturbances. Denies abdominal pain, nausea, vomiting, diarrhea or constipation.     Past Medical History  Diagnosis Date  . CAD (coronary artery disease)     Stent LAD, 2002  /  nuclear 2009, no ischemia  /    [facility] October, 2011 nuclear, no ischemia, possible slight apical scar, ejection fraction 70%  . Ejection fraction     EF 65-70%, echo, 2007 /  EF 70%, nuclear, 2011  . Atrial fibrillation     Paroxysmal  . Warfarin anticoagulation     Low CHADS , score, but patient wants to be aggressive  . Drug intolerance     Mild beta blocker intolerance  . Incomplete RBBB   . Syncope     November, 2010  . Carotid bruit     Doppler November, 2011, normal  . Knee pain     November, 201  . BPH (benign prostatic hyperplasia)   . Back pain   . Anxiety     Mild  . Dyslipidemia   .  Colon polyps   . Palpitations   . IBS (irritable bowel syndrome)   . Personal history of colonic adenomas 11/29/2006    Qualifier: Diagnosis of  By: Genelle Gather CMA, [place]     Past Surgical History  Procedure Laterality Date  . Colonoscopy  multiple   Family History  Problem Relation Age of Onset  . Heart attack Mother   . Heart attack Brother    History  Substance Use Topics  . Smoking status: Never Smoker   . Smokeless tobacco: Never Used  . Alcohol Use: No    Review of Systems   Constitutional: Negative for fever, diaphoresis and fatigue.  HENT: Negative for trouble swallowing.   Respiratory: Negative for cough and shortness of breath.   Cardiovascular: Positive for chest pain. Negative for palpitations, orthopnea, claudication, syncope, PND and near-syncope.  Gastrointestinal: Negative for heartburn, nausea, vomiting, abdominal pain and anorexia.  Musculoskeletal: Negative for back pain.  Neurological: Positive for weakness. Negative for dizziness, numbness and headaches.    Allergies  Levofloxacin; Lopid; and Sulfonamide derivatives  Home Medications   Current Outpatient Rx  Name  Route  Sig  Dispense  Refill  . aspirin EC 325 MG tablet   Oral   Take 650 mg by mouth once.         Marland Kitchen aspirin EC 81 MG tablet   Oral   Take 81 mg by mouth at bedtime.         Marland Kitchen atorvastatin (LIPITOR) 80 MG tablet   Oral   Take 40 mg by mouth at bedtime.         . Calcium Carb-Cholecalciferol (CALCIUM 600+D3 PO)   Oral   Take 1 capsule by mouth daily.          . Cholecalciferol (VITAMIN D3) 1000 UNITS CAPS   Oral   Take 1,000 Units by mouth daily.          Marland Kitchen diltiazem (CARDIZEM CD) 180 MG 24 hr capsule   Oral   Take 1 capsule (180 mg total) by mouth daily.   30 capsule   5   . diltiazem (CARDIZEM) 60 MG tablet   Oral   Take 60 mg by mouth daily as needed (for afib episodes).         . fish oil-omega-3 fatty acids 1000 MG capsule   Oral   Take 1 g by mouth daily.          . Glucosamine-Chondroitin 1500-1200 MG/30ML LIQD   Oral   Take 1,500 mg by mouth daily.          . Multiple Vitamins-Minerals (MULTIVITAMIN WITH MINERALS) tablet   Oral   Take 1 tablet by mouth daily.          . niacin (NIASPAN) 500 MG CR tablet   Oral   Take 1,000 mg by mouth daily with lunch.         . nitroGLYCERIN (NITROSTAT) 0.4 MG SL tablet   Sublingual   Place 0.4 mg under the tongue every 5 (five) minutes as needed for chest pain.         Marland Kitchen warfarin  (COUMADIN) 5 MG tablet   Oral   Take 2.5-5 mg by mouth at bedtime. Take 1 tablet (5 mg) on Thursdays, take 1/2 tablet (2.5 mg) on all other days          BP 113/69  Pulse 64  Temp(Src) 98.3 F (36.8 C)  Resp 16  SpO2 100% Physical Exam  Nursing  note and vitals reviewed. Constitutional: He appears well-developed and well-nourished. No distress.  HENT:  Head: Normocephalic and atraumatic.  Eyes: Conjunctivae are normal. No scleral icterus.  Neck: Normal range of motion. Neck supple.  Cardiovascular: Normal rate, regular rhythm and normal heart sounds.   Pulmonary/Chest: Effort normal and breath sounds normal. No respiratory distress. He exhibits no tenderness.  Abdominal: Soft. There is no tenderness.  Musculoskeletal: He exhibits no edema.  Neurological: He is alert.  Skin: Skin is warm and dry. He is not diaphoretic.  Psychiatric: His behavior is normal.  Anxious    ED Course  Procedures (including critical care time) Labs Review Labs Reviewed  COMPREHENSIVE METABOLIC PANEL - Abnormal; Notable for the following:    GFR calc non Af Amer 76 (*)    GFR calc Af Amer 88 (*)    All other components within normal limits  PROTIME-INR - Abnormal; Notable for the following:    Prothrombin Time 20.8 (*)    INR 1.85 (*)    All other components within normal limits  CBC  URINALYSIS, ROUTINE W REFLEX MICROSCOPIC  POCT I-STAT TROPONIN I   Imaging Review No results found.   Date: 02/23/2013  Rate: 64  Rhythm: normal sinus rhythm  QRS Axis: normal  Intervals: normal  ST/T Wave abnormalities: ST elevations inferiorly  Conduction Disutrbances:IRBBB  Narrative Interpretation:   Old EKG Reviewed: unchanged no reciprocal changes laterally    MDM  No diagnosis found. Patient with pmh sig for cardiac disease. EKG shows no acute abnormalities. Troponin negative.  Labs show no acute abnormality. Patient has had chest pain for approximately 10 hours. Do not suspect PE as  patient has no tachypnea, tachycardia, hemoptysis, pleurisy, decreased 02 sats and no leg swelling. Patient is on coumadin and aspirin making pe possibility very low.   8:56 PM labs show pateint is sub therapeutic on his INR. Review of his charty shows that his chads2 is low and that he is taking warfarin to be "aggressive."  I spoke with Dr. Mayford Knife who is on call for [place]. She asks that the patient be observed and have his cardiac enzymes cycled. I have spoken with the patient who expresses understanding and agrees with plan. He remains cp free. I have given report to PA schinlever who expresses understanding and agrees to admission.  Arthor Captain, PA-C 02/23/13 2101

## 2013-02-23 NOTE — ED Notes (Signed)
Called report to [place], RN unit 3W.

## 2013-02-23 NOTE — H&P (Signed)
[facility] History and Physical  TIERRA DIVELBISS AVW:[id number] DOB: [birth year] DOA: 02/23/2013  Referring physician: ER physician. PCP: Rudi Heap, MD  Specialists: [facility] cardiology. Dr. Myrtis Ser.  Chief Complaint: Chest pain.  HPI: Jordan [name] is a 67 y.o. male with history of CAD status post stenting in 2002 and has had cardiac stress test in 2011 which was showing negative ischemia presents with complaints of chest pain. Patient was in the church earlier today when he started developing left-sided staining pain in his chest which lasted a few seconds. It started to recur again after lunch. The pain is not related to exertion or any associated shortness of breath. Patient did have mild diaphoresis. Denies any nausea vomiting abdominal pain. Patient at this time is chest pain-free. Cardiologist on call for [facility] Dr. Mayford Knife was consulted by the ER physician and at this time Dr. Mayford Knife has requested hospitalist admission and cycle cardiac markers.  Patient during the episode felt that his atrial fibrillation going into rapid rate for which she took Cardizem 60 mg by mouth one dose. Despite taking which his symptoms did not improve.  Review of Systems: As presented in the history of presenting illness, rest negative.  Past Medical History  Diagnosis Date  . CAD (coronary artery disease)     Stent LAD, 2002  /  nuclear 2009, no ischemia  /    [facility] October, 2011 nuclear, no ischemia, possible slight apical scar, ejection fraction 70%  . Ejection fraction     EF 65-70%, echo, 2007 /  EF 70%, nuclear, 2011  . Atrial fibrillation     Paroxysmal  . Warfarin anticoagulation     Low CHADS , score, but patient wants to be aggressive  . Drug intolerance     Mild beta blocker intolerance  . Incomplete RBBB   . Syncope     November, 2010  . Carotid bruit     Doppler November, 2011, normal  . Knee pain     November, 201  . BPH (benign prostatic hyperplasia)   . Back pain   .  Anxiety     Mild  . Dyslipidemia   . Colon polyps   . Palpitations   . IBS (irritable bowel syndrome)   . Personal history of colonic adenomas 11/29/2006    Qualifier: Diagnosis of  By: Genelle Gather CMA, [place]     Past Surgical History  Procedure Laterality Date  . Colonoscopy  multiple  . Cardiac catheterization     Social History:  reports that he has never smoked. He has never used smokeless tobacco. He reports that he does not drink alcohol or use illicit drugs. Home. where does patient live-- Can do ADLs. Can patient participate in ADLs?  Allergies  Allergen Reactions  . Levofloxacin Other (See Comments)    Couldn't breathe, passed out  . Lopid [Gemfibrozil] Other (See Comments)    Loose bowels  . Sulfonamide Derivatives Hives    Family History  Problem Relation Age of Onset  . Heart attack Mother   . Heart attack Brother       Prior to Admission medications   Medication Sig Start Date End Date Taking? Authorizing Provider  aspirin EC 325 MG tablet Take 650 mg by mouth once.   Yes Historical Provider, MD  aspirin EC 81 MG tablet Take 81 mg by mouth at bedtime.   Yes Historical Provider, MD  atorvastatin (LIPITOR) 80 MG tablet Take 40 mg by mouth at bedtime.   Yes Historical  Provider, MD  Calcium Carb-Cholecalciferol (CALCIUM 600+D3 PO) Take 1 capsule by mouth daily.    Yes Historical Provider, MD  Cholecalciferol (VITAMIN D3) 1000 UNITS CAPS Take 1,000 Units by mouth daily.    Yes Historical Provider, MD  diltiazem (CARDIZEM CD) 180 MG 24 hr capsule Take 1 capsule (180 mg total) by mouth daily. 09/25/12  Yes Luis Abed, MD  diltiazem (CARDIZEM) 60 MG tablet Take 60 mg by mouth daily as needed (for afib episodes).   Yes Historical Provider, MD  fish oil-omega-3 fatty acids 1000 MG capsule Take 1 g by mouth daily.    Yes Historical Provider, MD  Glucosamine-Chondroitin 1500-1200 MG/30ML LIQD Take 1,500 mg by mouth daily.    Yes Historical Provider, MD  Multiple  Vitamins-Minerals (MULTIVITAMIN WITH MINERALS) tablet Take 1 tablet by mouth daily.    Yes Historical Provider, MD  niacin (NIASPAN) 500 MG CR tablet Take 1,000 mg by mouth daily with lunch.   Yes Historical Provider, MD  nitroGLYCERIN (NITROSTAT) 0.4 MG SL tablet Place 0.4 mg under the tongue every 5 (five) minutes as needed for chest pain.   Yes Historical Provider, MD  warfarin (COUMADIN) 5 MG tablet Take 2.5-5 mg by mouth at bedtime. Take 1 tablet (5 mg) on Thursdays, take 1/2 tablet (2.5 mg) on all other days   Yes Historical Provider, MD   Physical Exam: Filed Vitals:   02/23/13 2045 02/23/13 2100 02/23/13 2115 02/23/13 2130  BP: 113/67 118/62 115/80 118/73  Pulse: 59 59 59 59  Temp:      Resp: 17 19 16 18   SpO2: 99% 98% 99% 99%     General:  Well-developed well-nourished.  Eyes: Anicteric no pallor.  ENT: No discharge from ears eyes nose mouth.  Neck: No mass felt.  Cardiovascular:  S1-S2 heard.  Respiratory: No rhonchi or crepitations.  Abdomen: Soft nontender bowel sounds present.  Skin: No rash.  Musculoskeletal: No edema.  Psychiatric: Appears normal.  Neurologic: Alert awake oriented to time place and person. Moves all extremities.  Labs on Admission:  Basic Metabolic Panel:  Recent Labs Lab 02/23/13 1900  NA 139  K 4.1  CL 103  CO2 26  GLUCOSE 97  BUN 21  CREATININE 1.01  CALCIUM 9.6   Liver Function Tests:  Recent Labs Lab 02/23/13 1900  AST 28  ALT 25  ALKPHOS 52  BILITOT 0.4  PROT 6.8  ALBUMIN 3.6   No results found for this basename: LIPASE, AMYLASE,  in the last 168 hours No results found for this basename: AMMONIA,  in the last 168 hours CBC:  Recent Labs Lab 02/23/13 1900  WBC 8.7  HGB 14.8  HCT 43.1  MCV 89.8  PLT 180   Cardiac Enzymes: No results found for this basename: CKTOTAL, CKMB, CKMBINDEX, TROPONINI,  in the last 168 hours  BNP (last 3 results) No results found for this basename: PROBNP,  in the last 8760  hours CBG: No results found for this basename: GLUCAP,  in the last 168 hours  Radiological Exams on Admission: Dg Chest 2 View  02/23/2013   CLINICAL DATA:  Chest pain.  EXAM: CHEST  2 VIEW  COMPARISON:  CHEST x-ray 04/04/2010.  FINDINGS: Lung volumes are normal. No consolidative airspace disease. No pleural effusions. No pneumothorax. No pulmonary nodule or mass noted. Pulmonary vasculature and the cardiomediastinal silhouette are within normal limits. Atherosclerotic calcifications in the thoracic aorta.  IMPRESSION: 1. No radiographic evidence of acute cardiopulmonary disease. 2.  Atherosclerosis.  Electronically Signed   By: Trudie Reed M.D.   On: 02/23/2013 19:47    EKG: Independently reviewed. Normal sinus rhythm.  Assessment/Plan Principal Problem:   Chest pain Active Problems:   CAD (coronary artery disease)   Atrial fibrillation   hyperlipidemia   1. Chest pain with history of CAD status post stenting - chest the chest pain-free. Chest pain appears atypical. Cycle cardiac markers. 2. A. fib presently in sinus rhythm - Coumadin per pharmacy. 3. Hyperlipidemia - continue present medications.    Code Status: Full code.  Family Communication: Patient's wife at the bedside.  Disposition Plan: Admit for observation.    KAKRAKANDY,ARSHAD N. [facility] Pager [phone].  If 7PM-7AM, please contact night-coverage [email or web address] Password [facility] 02/23/2013, 9:53 PM

## 2013-02-24 DIAGNOSIS — I4891 Unspecified atrial fibrillation: Secondary | ICD-10-CM | POA: Diagnosis not present

## 2013-02-24 DIAGNOSIS — Z7901 Long term (current) use of anticoagulants: Secondary | ICD-10-CM

## 2013-02-24 DIAGNOSIS — I2510 Atherosclerotic heart disease of native coronary artery without angina pectoris: Secondary | ICD-10-CM | POA: Diagnosis not present

## 2013-02-24 DIAGNOSIS — R079 Chest pain, unspecified: Secondary | ICD-10-CM | POA: Diagnosis not present

## 2013-02-24 DIAGNOSIS — E785 Hyperlipidemia, unspecified: Secondary | ICD-10-CM

## 2013-02-24 DIAGNOSIS — I517 Cardiomegaly: Secondary | ICD-10-CM | POA: Diagnosis not present

## 2013-02-24 LAB — CBC
HCT: 41.9 % (ref 39.0–52.0)
Hemoglobin: 14.2 g/dL (ref 13.0–17.0)
MCH: 30.5 pg (ref 26.0–34.0)
MCHC: 33.9 g/dL (ref 30.0–36.0)

## 2013-02-24 LAB — TROPONIN I: Troponin I: <0.3 ng/mL (ref <0.30)

## 2013-02-24 LAB — LIPID PANEL
LDL Cholesterol: 55 mg/dL (ref 0–99)
VLDL: 16 mg/dL (ref 0–40)

## 2013-02-24 LAB — PROTIME-INR: Prothrombin Time: 21.6 seconds — ABNORMAL HIGH (ref 11.6–15.2)

## 2013-02-24 LAB — BASIC METABOLIC PANEL
BUN: 19 mg/dL (ref 6–23)
Chloride: 106 mEq/L (ref 96–112)
Glucose, Bld: 85 mg/dL (ref 70–99)
Potassium: 3.6 mEq/L (ref 3.5–5.1)

## 2013-02-24 MED ORDER — WARFARIN SODIUM 5 MG PO TABS
5.0000 mg | ORAL_TABLET | Freq: Once | ORAL | Status: AC
  Administered 2013-02-24: 5 mg via ORAL
  Filled 2013-02-24: qty 1

## 2013-02-24 NOTE — Progress Notes (Signed)
Utilization review completed.  

## 2013-02-24 NOTE — Progress Notes (Signed)
[facility] PROGRESS NOTE  Jordan [name] HQI:[id number] DOB: Apr 26, 1946 DOA: 02/23/2013 PCP: Rudi Heap, MD  Assessment/Plan: 1. Chest pain; resolved;  --Troponin x3 negative --Echocardiogram results pending  2. .A. Fib  presently in sinus rhythm - Coumadin per pharmacy. Currently subtherapeutic   3. Hyperlipidemia - continue present medications.    Code Status: Full Family Communication: Wife at bedside for plan of care discussion Disposition Plan:    Consultants:  Procedures:  CXR 02/23/2013 1. No radiographic evidence of acute cardiopulmonary disease.  2. Atherosclerosis.    Antibiotics:   HPI/Subjective: Jordan [name] is a 67 y.o. male with history of CAD status post stenting in 2002 and has had cardiac stress test in 2011 which was showing negative ischemia presents with complaints of chest pain. Patient was in the church earlier today when he started developing left-sided staining pain in his chest which lasted a few seconds. It started to recur again after lunch. The pain is not related to exertion or any associated shortness of breath. Patient did have mild diaphoresis. Denies any nausea vomiting abdominal pain. Patient at this time is chest pain-free. Cardiologist on call for [place] Dr. Mayford Knife was consulted by the ER physician and at this time Dr. Mayford Knife has requested hospitalist admission and cycle cardiac markers.  Patient during the episode felt that his atrial fibrillation going into rapid rate for which she took Cardizem 60 mg by mouth one dose. Despite taking which his symptoms did not improve. TODAY on in x2 negative, INR = 1.94 (subtherapeutic)     Objective: Filed Vitals:   02/23/13 2200 02/23/13 2215 02/23/13 2253 02/24/13 0520  BP: 122/68 109/62 109/61 102/65  Pulse: 66 55 62 52  Temp:   98.2 F (36.8 C) 97.6 F (36.4 C)  TempSrc:   Oral   Resp: 13 17 16 16   Height:   6\' 2"  (1.88 m)   Weight:   97.569 kg (215 lb 1.6 oz)   SpO2: 99%  97% 97% 99%   No intake or output data in the 24 hours ending 02/24/13 0821 Filed Weights   02/23/13 2253  Weight: 97.569 kg (215 lb 1.6 oz)    Exam: General: Alert and oriented x4,NAD  Cardiovascular: Regular rhythm and rate, negative murmurs rubs gallops, DP/PT pulse 2+  Respiratory: Clear to auscultation bilateral  Abdomen: Soft, nontender, nondistended, plus bowel sounds              Musculoskeletal: Negative pedal edema    Data Reviewed: Basic Metabolic Panel:  Recent Labs Lab 02/23/13 1900 02/24/13 0538  NA 139 143  K 4.1 3.6  CL 103 106  CO2 26 25  GLUCOSE 97 85  BUN 21 19  CREATININE 1.01 0.98  CALCIUM 9.6 8.6   Liver Function Tests:  Recent Labs Lab 02/23/13 1900  AST 28  ALT 25  ALKPHOS 52  BILITOT 0.4  PROT 6.8  ALBUMIN 3.6   No results found for this basename: LIPASE, AMYLASE,  in the last 168 hours No results found for this basename: AMMONIA,  in the last 168 hours CBC:  Recent Labs Lab 02/23/13 1900 02/24/13 0538  WBC 8.7 7.6  HGB 14.8 14.2  HCT 43.1 41.9  MCV 89.8 90.1  PLT 180 178   Cardiac Enzymes:  Recent Labs Lab 02/23/13 2335 02/24/13 0508  TROPONINI <0.30 <0.30   BNP (last 3 results) No results found for this basename: PROBNP,  in the last 8760 hours CBG: No results found for this  basename: GLUCAP,  in the last 168 hours  No results found for this or any previous visit (from the past 240 hour(s)).   Studies: Dg Chest 2 View  02/23/2013   CLINICAL DATA:  Chest pain.  EXAM: CHEST  2 VIEW  COMPARISON:  CHEST x-ray 04/04/2010.  FINDINGS: Lung volumes are normal. No consolidative airspace disease. No pleural effusions. No pneumothorax. No pulmonary nodule or mass noted. Pulmonary vasculature and the cardiomediastinal silhouette are within normal limits. Atherosclerotic calcifications in the thoracic aorta.  IMPRESSION: 1. No radiographic evidence of acute cardiopulmonary disease. 2.  Atherosclerosis.   Electronically Signed    By: Trudie Reed M.D.   On: 02/23/2013 19:47    Scheduled Meds: . aspirin EC  325 mg Oral Daily  . atorvastatin  40 mg Oral QHS  . cholecalciferol  1,000 Units Oral Daily  . diltiazem  180 mg Oral Daily  . influenza vac split quadrivalent PF  0.5 mL Intramuscular Tomorrow-1000  . niacin  1,000 mg Oral Q lunch  . omega-3 acid ethyl esters  1 g Oral Daily  . sodium chloride  3 mL Intravenous Q12H  . sodium chloride  3 mL Intravenous Q12H  . Warfarin - Pharmacist Dosing Inpatient   Does not apply q1800   Continuous Infusions:   Principal Problem:   Chest pain Active Problems:   CAD (coronary artery disease)   Atrial fibrillation   hyperlipidemia    Time spent: 30 min    WOODS, CURTIS, J  [facility] Pager [phone]. If 7PM-7AM, please contact night-coverage at [email or web address], password [facility] 02/24/2013, 8:21 AM  LOS: 1 day

## 2013-02-24 NOTE — Progress Notes (Signed)
  Echocardiogram 2D Echocardiogram has been performed.  Jordan [name] 02/24/2013, 3:00 PM 

## 2013-02-24 NOTE — Progress Notes (Signed)
PHARMACY FOLLOW UP NOTE  Pharmacy Consult for : Coumadin Indication:  atrial fibrillation   Dosing Weight: 97.6 kg  Labs:  Recent Labs  02/23/13 1900 02/23/13 1930 02/24/13 0538  HGB 14.8  --  14.2  HCT 43.1  --  41.9  PLT 180  --  178  LABPROT  --  20.8* 21.6*  INR  --  1.85* 1.94*  CREATININE 1.01  --  0.98   Lab Results  Component Value Date   INR 1.94* 02/24/2013   INR 1.85* 02/23/2013   INR 2.1 02/06/2013   Lab Results  Component Value Date   CKTOTAL 127 04/05/2010   CKMB 1.1 04/05/2010   TROPONINI <0.30 02/24/2013    Medications:  Prescriptions prior to admission  Medication Sig Dispense Refill  . aspirin EC 325 MG tablet Take 650 mg by mouth once.      Marland Kitchen aspirin EC 81 MG tablet Take 81 mg by mouth at bedtime.      Marland Kitchen atorvastatin (LIPITOR) 80 MG tablet Take 40 mg by mouth at bedtime.      . Calcium Carb-Cholecalciferol (CALCIUM 600+D3 PO) Take 1 capsule by mouth daily.       . Cholecalciferol (VITAMIN D3) 1000 UNITS CAPS Take 1,000 Units by mouth daily.       Marland Kitchen diltiazem (CARDIZEM CD) 180 MG 24 hr capsule Take 1 capsule (180 mg total) by mouth daily.  30 capsule  5  . diltiazem (CARDIZEM) 60 MG tablet Take 60 mg by mouth daily as needed (for afib episodes).      . fish oil-omega-3 fatty acids 1000 MG capsule Take 1 g by mouth daily.       . Glucosamine-Chondroitin 1500-1200 MG/30ML LIQD Take 1,500 mg by mouth daily.       . Multiple Vitamins-Minerals (MULTIVITAMIN WITH MINERALS) tablet Take 1 tablet by mouth daily.       . niacin (NIASPAN) 500 MG CR tablet Take 1,000 mg by mouth daily with lunch.      . nitroGLYCERIN (NITROSTAT) 0.4 MG SL tablet Place 0.4 mg under the tongue every 5 (five) minutes as needed for chest pain.      Marland Kitchen warfarin (COUMADIN) 5 MG tablet Take 2.5-5 mg by mouth at bedtime. Take 1 tablet (5 mg) on Thursdays, take 1/2 tablet (2.5 mg) on all other days       Scheduled:  . aspirin EC  325 mg Oral Daily  . atorvastatin  40 mg Oral QHS  .  cholecalciferol  1,000 Units Oral Daily  . diltiazem  180 mg Oral Daily  . niacin  1,000 mg Oral Q lunch  . omega-3 acid ethyl esters  1 g Oral Daily  . sodium chloride  3 mL Intravenous Q12H  . sodium chloride  3 mL Intravenous Q12H  . Warfarin - Pharmacist Dosing Inpatient   Does not apply q1800   Anti-infectives   None     Assessment:  67yo male admitted with sharp CP associated w/ diaphoresis.  Patient pain free, CE's negative.  Patient continuing on Chronic Coumadin for VTE prophylaxis with history of atrial fibrillation   INR approaching goal, 1.94.  No bleeding complications noted   Goal of Therapy:  INR 2-3   Plan:  Coumadin 5 mg today.  Daily INR's, CBC.  Monitor for bleeding complications.    Magon Croson, Deetta Perla.D 02/24/2013, 10:31 AM

## 2013-02-25 DIAGNOSIS — I4891 Unspecified atrial fibrillation: Secondary | ICD-10-CM | POA: Diagnosis not present

## 2013-02-25 DIAGNOSIS — E785 Hyperlipidemia, unspecified: Secondary | ICD-10-CM | POA: Diagnosis not present

## 2013-02-25 DIAGNOSIS — R079 Chest pain, unspecified: Secondary | ICD-10-CM | POA: Diagnosis not present

## 2013-02-25 DIAGNOSIS — Z7901 Long term (current) use of anticoagulants: Secondary | ICD-10-CM | POA: Diagnosis not present

## 2013-02-25 LAB — PROTIME-INR
INR: 2.99 — ABNORMAL HIGH (ref 0.00–1.49)
Prothrombin Time: 30 seconds — ABNORMAL HIGH (ref 11.6–15.2)

## 2013-02-25 MED ORDER — NIACIN ER (ANTIHYPERLIPIDEMIC) 1000 MG PO TBCR: 1000.0000 mg | EXTENDED_RELEASE_TABLET | Freq: Every day | ORAL | Status: DC

## 2013-02-25 MED ORDER — ATORVASTATIN CALCIUM 40 MG PO TABS: 40.0000 mg | ORAL_TABLET | Freq: Every day | ORAL | Status: DC

## 2013-02-25 MED ORDER — WARFARIN SODIUM 2.5 MG PO TABS: ORAL_TABLET | ORAL | Status: DC

## 2013-02-25 NOTE — ED Provider Notes (Signed)
  This was a shared visit with a mid-level provided (NP or PA).  Throughout the patient's course I was available for consultation/collaboration.  I saw the ECG (if appropriate), relevant labs and studies - I agree with the interpretation.  On my exam the patient was in no distress.  However, with his history of heart disease, there is some concern for atypical presentation stent occlusion and with the patient's subtherapeutic INR he required admission for further evaluation and management.      Gerhard Munch, MD 02/25/13 [phone]

## 2013-02-25 NOTE — Discharge Summary (Signed)
[facility] DISCHARGE SUMMARY  Jordan [name]:[id number] DOB: 1945/11/03 DOA: 02/23/2013 PCP: Rudi Heap, MD  Assessment/Plan: 1. Chest pain; resolved;  --Troponin x3 negative --Echocardiogram results pending  2. .A. Fib  presently in sinus rhythm - Coumadin per pharmacy. Currently therapeutic, however patient's INR jumped considerably from yesterday to today patient will need to followup in Coumadin clinic within 7 days for further adjustments sees [employer].D. for Coumadin control.   3. Hyperlipidemia - continue present medications.    Code Status: Full Family Communication: Wife at bedside for plan of care discussion Disposition Plan:    Consultants:  Procedures: Echocardiogram 02/24/2013 - Left ventricle: The cavity size was normal. Wall thickness was normal. Systolic function was normal.  -LVEF= 60% to 65%.  - Left ventricular diastolic function parameters were normal. - Right ventricle: The cavity size was mildly dilated. - Right atrium: The atrium was mildly dilated.    CXR 02/23/2013 1. No radiographic evidence of acute cardiopulmonary disease.  2. Atherosclerosis.    Antibiotics:   HPI/Subjective: Jordan [name] is a 67 y.o. male with history of CAD status post stenting in 2002 and has had cardiac stress test in 2011 which was showing negative ischemia presents with complaints of chest pain. Patient was in the church earlier today when he started developing left-sided staining pain in his chest which lasted a few seconds. It started to recur again after lunch. The pain is not related to exertion or any associated shortness of breath. Patient did have mild diaphoresis. Denies any nausea vomiting abdominal pain. Patient at this time is chest pain-free. Cardiologist on call for [place] Dr. Mayford Knife was consulted by the ER physician and at this time Dr. Mayford Knife has requested hospitalist admission and cycle cardiac markers.  Patient during the  episode felt that his atrial fibrillation going into rapid rate for which she took Cardizem 60 mg by mouth one dose. Despite taking which his symptoms did not improve. TODAY troponin x3 negative, INR = 2.99 (subtherapeutic). Patient ready for discharge     Objective: Filed Vitals:   02/24/13 0520 02/24/13 1400 02/24/13 1940 02/25/13 0459  BP: 102/65 110/63 115/66 104/56  Pulse: 52 57 60 55  Temp: 97.6 F (36.4 C) 98 F (36.7 C) 97.7 F (36.5 C) 98 F (36.7 C)  TempSrc:  Oral Oral Oral  Resp: 16 17 18 18   Height:      Weight:      SpO2: 99% 98% 99% 95%   No intake or output data in the 24 hours ending 02/25/13 0738 Filed Weights   02/23/13 2253  Weight: 97.569 kg (215 lb 1.6 oz)    Exam: General: Alert and oriented x4,NAD  Cardiovascular: Regular rhythm and rate, negative murmurs rubs gallops, DP/PT pulse 2+  Respiratory: Clear to auscultation bilateral  Abdomen: Soft, nontender, nondistended, plus bowel sounds              Musculoskeletal: Negative pedal edema    Data Reviewed: Basic Metabolic Panel:  Recent Labs Lab 02/23/13 1900 02/24/13 0538  NA 139 143  K 4.1 3.6  CL 103 106  CO2 26 25  GLUCOSE 97 85  BUN 21 19  CREATININE 1.01 0.98  CALCIUM 9.6 8.6   Liver Function Tests:  Recent Labs Lab 02/23/13 1900  AST 28  ALT 25  ALKPHOS 52  BILITOT 0.4  PROT 6.8  ALBUMIN 3.6   No results found for this basename: LIPASE, AMYLASE,  in the last 168 hours No  results found for this basename: AMMONIA,  in the last 168 hours CBC:  Recent Labs Lab 02/23/13 1900 02/24/13 0538  WBC 8.7 7.6  HGB 14.8 14.2  HCT 43.1 41.9  MCV 89.8 90.1  PLT 180 178   Cardiac Enzymes:  Recent Labs Lab 02/23/13 2335 02/24/13 0508 02/24/13 1053  TROPONINI <0.30 <0.30 <0.30   BNP (last 3 results) No results found for this basename: PROBNP,  in the last 8760 hours CBG: No results found for this basename: GLUCAP,  in the last 168 hours  No results found for this or  any previous visit (from the past 240 hour(s)).   Studies: Dg Chest 2 View  02/23/2013   CLINICAL DATA:  Chest pain.  EXAM: CHEST  2 VIEW  COMPARISON:  CHEST x-ray 04/04/2010.  FINDINGS: Lung volumes are normal. No consolidative airspace disease. No pleural effusions. No pneumothorax. No pulmonary nodule or mass noted. Pulmonary vasculature and the cardiomediastinal silhouette are within normal limits. Atherosclerotic calcifications in the thoracic aorta.  IMPRESSION: 1. No radiographic evidence of acute cardiopulmonary disease. 2.  Atherosclerosis.   Electronically Signed   By: Trudie Reed M.D.   On: 02/23/2013 19:47    Scheduled Meds: . aspirin EC  325 mg Oral Daily  . atorvastatin  40 mg Oral QHS  . cholecalciferol  1,000 Units Oral Daily  . diltiazem  180 mg Oral Daily  . niacin  1,000 mg Oral Q lunch  . omega-3 acid ethyl esters  1 g Oral Daily  . sodium chloride  3 mL Intravenous Q12H  . sodium chloride  3 mL Intravenous Q12H  . Warfarin - Pharmacist Dosing Inpatient   Does not apply q1800   Continuous Infusions:   Principal Problem:   Chest pain Active Problems:   CAD (coronary artery disease)   Atrial fibrillation   hyperlipidemia    Time spent: 30 min    Jordan [name], J  [facility] Pager [phone]. If 7PM-7AM, please contact night-coverage at [email or web address], password [facility] 02/25/2013, 7:38 AM  LOS: 2 days

## 2013-02-27 ENCOUNTER — Telehealth: Payer: Self-pay | Admitting: Pharmacist

## 2013-03-03 NOTE — Telephone Encounter (Signed)
Patient called - appt made for 03/06/13 at 8am to recheck protime and check NMR

## 2013-03-06 ENCOUNTER — Ambulatory Visit (INDEPENDENT_AMBULATORY_CARE_PROVIDER_SITE_OTHER): Payer: Medicare Other | Admitting: Pharmacist

## 2013-03-06 DIAGNOSIS — E785 Hyperlipidemia, unspecified: Secondary | ICD-10-CM | POA: Diagnosis not present

## 2013-03-06 DIAGNOSIS — Z79899 Other long term (current) drug therapy: Secondary | ICD-10-CM

## 2013-03-06 DIAGNOSIS — I4891 Unspecified atrial fibrillation: Secondary | ICD-10-CM | POA: Diagnosis not present

## 2013-03-06 DIAGNOSIS — Z7901 Long term (current) use of anticoagulants: Secondary | ICD-10-CM | POA: Diagnosis not present

## 2013-03-06 LAB — COMPLETE METABOLIC PANEL WITH GFR
AST: 22 U/L (ref 0–37)
Alkaline Phosphatase: 51 U/L (ref 39–117)
GFR, Est Non African American: 79 mL/min
Glucose, Bld: 88 mg/dL (ref 70–99)
Sodium: 138 mEq/L (ref 135–145)
Total Bilirubin: 1 mg/dL (ref 0.3–1.2)
Total Protein: 6 g/dL (ref 6.0–8.3)

## 2013-03-06 LAB — POCT INR: INR: 2.1

## 2013-03-06 NOTE — Patient Instructions (Signed)
Anticoagulation Dose Instructions as of 03/06/2013     Jordan [name] Tue Wed Thu Fri Sat   New Dose 2.5 mg 2.5 mg 2.5 mg 2.5 mg 5 mg 2.5 mg 2.5 mg    Description       Continue 1/2 tablet daily except 1 tablet on thursdays      INR was 2.1 today

## 2013-03-07 LAB — NMR LIPOPROFILE WITH LIPIDS
HDL Size: 9 nm — ABNORMAL LOW (ref >=9.2)
HDL-C: 40 mg/dL (ref >=40)
LDL (calc): 70 mg/dL (ref <100)
LDL Particle Number: 886 nmol/L (ref <1000)
LDL Size: 20.2 nm — ABNORMAL LOW (ref >20.5)
Large HDL-P: 5.1 umol/L (ref >=4.8)
VLDL Size: 42 nm (ref <=46.6)

## 2013-03-13 ENCOUNTER — Encounter: Payer: Self-pay | Admitting: Pharmacist

## 2013-03-25 ENCOUNTER — Other Ambulatory Visit: Payer: Self-pay | Admitting: Cardiology

## 2013-04-03 ENCOUNTER — Ambulatory Visit (INDEPENDENT_AMBULATORY_CARE_PROVIDER_SITE_OTHER): Payer: Medicare Other | Admitting: Pharmacist

## 2013-04-03 DIAGNOSIS — Z7901 Long term (current) use of anticoagulants: Secondary | ICD-10-CM | POA: Diagnosis not present

## 2013-04-03 DIAGNOSIS — I4891 Unspecified atrial fibrillation: Secondary | ICD-10-CM | POA: Diagnosis not present

## 2013-04-03 LAB — POCT INR: INR: 1.8

## 2013-04-03 NOTE — Patient Instructions (Signed)
Anticoagulation Dose Instructions as of 04/03/2013     Jordan [name] Tue Wed Thu Fri Sat   New Dose 2.5 mg 2.5 mg 2.5 mg 2.5 mg 5 mg 2.5 mg 2.5 mg    Description       1 tablet today as planned and 1 tablet tomorrow.  Then continue 1/2 tablet daily except 1 tablet on thursdays      INR was 1.8 today

## 2013-04-05 ENCOUNTER — Other Ambulatory Visit: Payer: Self-pay | Admitting: Family Medicine

## 2013-04-05 ENCOUNTER — Ambulatory Visit (HOSPITAL_COMMUNITY)
Admission: RE | Admit: 2013-04-05 | Discharge: 2013-04-05 | Disposition: A | Discharge status: Home / Self Care | Payer: Medicare Other | Source: Ambulatory Visit | Attending: Family Medicine | Admitting: Family Medicine

## 2013-04-05 ENCOUNTER — Ambulatory Visit (INDEPENDENT_AMBULATORY_CARE_PROVIDER_SITE_OTHER): Payer: Medicare Other | Admitting: Family Medicine

## 2013-04-05 VITALS — BP 143/79 | HR 84 | Temp 97.9°F | Ht 74.0 in

## 2013-04-05 DIAGNOSIS — M25473 Effusion, unspecified ankle: Secondary | ICD-10-CM | POA: Diagnosis not present

## 2013-04-05 DIAGNOSIS — M19079 Primary osteoarthritis, unspecified ankle and foot: Secondary | ICD-10-CM | POA: Diagnosis not present

## 2013-04-05 DIAGNOSIS — I4891 Unspecified atrial fibrillation: Secondary | ICD-10-CM

## 2013-04-05 DIAGNOSIS — M7989 Other specified soft tissue disorders: Secondary | ICD-10-CM | POA: Diagnosis not present

## 2013-04-05 DIAGNOSIS — Z7901 Long term (current) use of anticoagulants: Secondary | ICD-10-CM

## 2013-04-05 DIAGNOSIS — M25571 Pain in right ankle and joints of right foot: Secondary | ICD-10-CM

## 2013-04-05 DIAGNOSIS — M25579 Pain in unspecified ankle and joints of unspecified foot: Secondary | ICD-10-CM

## 2013-04-05 DIAGNOSIS — G8929 Other chronic pain: Secondary | ICD-10-CM

## 2013-04-05 DIAGNOSIS — M79609 Pain in unspecified limb: Secondary | ICD-10-CM | POA: Diagnosis not present

## 2013-04-05 LAB — CBC
Hemoglobin: 15.4 g/dL (ref 13.0–17.0)
MCH: 31.2 pg (ref 26.0–34.0)
RBC: 4.93 MIL/uL (ref 4.22–5.81)

## 2013-04-05 LAB — PROTIME-INR
INR: 1.92 — ABNORMAL HIGH (ref <1.50)
Prothrombin Time: 21.4 seconds — ABNORMAL HIGH (ref 11.6–15.2)

## 2013-04-05 NOTE — Progress Notes (Signed)
  Subjective:    Patient ID: Jordan [name], male    DOB: [birth year], 67 y.o.   MRN: [id number]  HPI Acute onset right ankle pain yesterday morning. Pain is markedly worse this morning. No known injury. Patient is unable to bear weight. During the day yesterday the pain seemed to get better. But it returned during the night and early this morning and a more severe nature. The patient denies injury. He said that he had a previous episode of this in the past when he ruptured a blood vessel. There is no history of gout. He is on Niaspan. He is not on a diuretic. Patient comes to the visit in a wheelchair and with his son and wife.   Review of Systems  Respiratory: Negative for chest tightness and shortness of breath.   Cardiovascular: Negative for chest pain.  Genitourinary: Negative.   Musculoskeletal: Positive for arthralgias (right ankle pain), gait problem and joint swelling.  All other systems reviewed and are negative.       Objective:   Physical Exam  Nursing note and vitals reviewed. Constitutional: He is oriented to person, place, and time. He appears well-developed and well-nourished. No distress.  HENT:  Head: Normocephalic and atraumatic.  Eyes: Right eye exhibits no discharge. Left eye exhibits discharge. No scleral icterus.  Neck: Normal range of motion.  Cardiovascular: Normal rate, regular rhythm, normal heart sounds and intact distal pulses.  Exam reveals no gallop and no friction rub.   No murmur heard. At 72 per minute  Pulmonary/Chest: Breath sounds normal. No respiratory distress. He has no wheezes. He has no rales. He exhibits no tenderness.  Abdominal: Soft. He exhibits no distension and no mass. There is tenderness (generally mild tenderness, no specciific area). There is no rebound and no guarding.  Musculoskeletal: He exhibits edema and tenderness.  Unable to bear weight on right ankle. Tender with palpation of lateral and medial malleolus. There is warmth to  this ankle compared to the left, there is slight edema of this ankle compared to the left, there is pain with flexion and extension of the foot. Homans sign is negative. Recent protime was on the thick side but this was adjusted on 2 days ago. There was no tenderness in the right calf. There was no sign of any bruising or hematoma.  Neurological: He is alert and oriented to person, place, and time.  Skin: Skin is warm and dry. No rash noted. No erythema. No pallor.  Psychiatric: He has a normal mood and affect. His behavior is normal. Thought content normal.          Assessment & Plan:   1. Pain in joint, ankle and foot, right   2. Long term (current) use of anticoagulants   3. A-fib    Patient Instructions  Patient was instructed to go to the hospital for lab work and x-rays He should try to elevate his foot as much as possible He should do no weightbearing Hopefully we can discuss the results of the above tests with the patient once they are complete He will go to the lab at the hospital where they will do a CBC, pro time, and uric acid He will go to x-ray where they will do the right ankle and foot    Separate nodes were given to the patient to take to the lab and x-ray at [facility]  Nyra Capes MD

## 2013-04-05 NOTE — Patient Instructions (Signed)
Patient was instructed to go to the hospital for lab work and x-rays He should try to elevate his foot as much as possible He should do no weightbearing Hopefully we can discuss the results of the above tests with the patient once they are complete He will go to the lab at the hospital where they will do a CBC, pro time, and uric acid He will go to x-ray where they will do the right ankle and foot

## 2013-04-18 ENCOUNTER — Encounter: Payer: Self-pay | Admitting: Cardiology

## 2013-04-21 ENCOUNTER — Encounter: Payer: Self-pay | Admitting: Cardiology

## 2013-04-21 ENCOUNTER — Ambulatory Visit (INDEPENDENT_AMBULATORY_CARE_PROVIDER_SITE_OTHER): Payer: Medicare Other | Admitting: Cardiology

## 2013-04-21 VITALS — BP 115/65 | HR 64 | Ht 74.0 in | Wt 219.0 lb

## 2013-04-21 DIAGNOSIS — I4891 Unspecified atrial fibrillation: Secondary | ICD-10-CM | POA: Diagnosis not present

## 2013-04-21 DIAGNOSIS — I2510 Atherosclerotic heart disease of native coronary artery without angina pectoris: Secondary | ICD-10-CM

## 2013-04-21 DIAGNOSIS — E785 Hyperlipidemia, unspecified: Secondary | ICD-10-CM

## 2013-04-21 DIAGNOSIS — Z7901 Long term (current) use of anticoagulants: Secondary | ICD-10-CM | POA: Diagnosis not present

## 2013-04-21 MED ORDER — NITROGLYCERIN 0.4 MG SL SUBL: 0.4000 mg | SUBLINGUAL_TABLET | SUBLINGUAL | Status: DC | PRN

## 2013-04-21 MED ORDER — DILTIAZEM HCL ER 60 MG PO CP12: 60.0000 mg | ORAL_CAPSULE | ORAL | Status: DC | PRN

## 2013-04-21 MED ORDER — DILTIAZEM HCL ER COATED BEADS 180 MG PO CP24: 180.0000 mg | ORAL_CAPSULE | Freq: Every day | ORAL | Status: DC

## 2013-04-21 NOTE — Assessment & Plan Note (Signed)
The patient's coronary disease has been stable. He had a nuclear study in 2011 revealing no ischemia. He needs ongoing careful secondary prevention. Because he is on Coumadin however aspirin will be stopped because he's been so stable.

## 2013-04-21 NOTE — Patient Instructions (Signed)
Your physician has recommended you make the following change in your medication: stop taking Aspirin (we refilled both of your Diltiazem and Nitroglycerin prescriptions today)   Your physician wants you to follow-up in: 6 months. You will receive a reminder letter in the mail two months in advance. If you don't receive a letter, please call our office to schedule the follow-up appointment.

## 2013-04-21 NOTE — Assessment & Plan Note (Signed)
We had a lengthy discussion about Coumadin today. I reminded him that we have been on the aggressive side in the past. His CHADS score is 0. However, his CHADSVAS score had been 1 until he became 67. It is now 2. He has known coronary disease. With this in mind he would like to remain on anticoagulation. He asked me if there was increasing risk from age. I explained to him that there was more increasing benefit  relative to risk with increasing age. I explained that the current approach is to stop aspirin when people are on Coumadin. Aspirin will be stopped today. We also discussed the new anticoagulants. We discussed these fully. At this point he still wants to remain on Coumadin.

## 2013-04-21 NOTE — Progress Notes (Signed)
HPI  Patient is seen today for followup atrial fibrillation and coronary disease. I saw him last October, 2013. He had a brief admission in September, 2014. There was no MI. He was stable. He had an echo showing excellent LV function. He was discharged home.  Today he is here feeling well. We had a lengthy discussion about the approach to coronary disease and atrial fibrillation and his lipids.  Allergies  Allergen Reactions  . Levofloxacin Other (See Comments)    Couldn't breathe, passed out  . Lopid [Gemfibrozil] Other (See Comments)    Loose bowels  . Sulfonamide Derivatives Hives    Current Outpatient Prescriptions  Medication Sig Dispense Refill  . atorvastatin (LIPITOR) 40 MG tablet Take 1 tablet (40 mg total) by mouth at bedtime.  30 tablet  0  . Calcium Carb-Cholecalciferol (CALCIUM 600+D3 PO) Take 1 capsule by mouth daily.       . Cholecalciferol (VITAMIN D3) 1000 UNITS CAPS Take 1,000 Units by mouth daily.       Marland Kitchen diltiazem (CARDIZEM CD) 180 MG 24 hr capsule Take 1 capsule (180 mg total) by mouth daily.  30 capsule  6  . diltiazem (CARDIZEM SR) 60 MG 12 hr capsule Take 1 capsule (60 mg total) by mouth as needed.  30 capsule  2  . fish oil-omega-3 fatty acids 1000 MG capsule Take 1 g by mouth daily.       . Glucosamine-Chondroitin 1500-1200 MG/30ML LIQD Take 1,500 mg by mouth daily.       . Multiple Vitamins-Minerals (MULTIVITAMIN WITH MINERALS) tablet Take 1 tablet by mouth daily.       . niacin (NIASPAN) 1000 MG CR tablet Take 1 tablet (1,000 mg total) by mouth daily with lunch.  30 tablet  0  . nitroGLYCERIN (NITROSTAT) 0.4 MG SL tablet Place 1 tablet (0.4 mg total) under the tongue every 5 (five) minutes as needed for chest pain.  25 tablet  3  . warfarin (COUMADIN) 2.5 MG tablet Take 1 tablet Daily  30 tablet  0   No current facility-administered medications for this visit.    History   Social History  . Marital Status: Married    Spouse Name: N/A    Number of  Children: N/A  . Years of Education: N/A   Occupational History  . 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 Smoking status: Never Smoker   . Smokeless tobacco: Never Used  . Alcohol Use: No  . Drug Use: No  . Sexual Activity: Not on file   Other Topics Concern  . Not on file   Social History Narrative  . No narrative on file    Family History  Problem Relation Age of Onset  . Heart attack Mother   . Heart attack Brother     Past Medical History  Diagnosis Date  . CAD (coronary artery disease)     Stent LAD, 2002  /  nuclear 2009, no ischemia  /    [facility] October, 2011 nuclear, no ischemia, possible slight apical scar, ejection fraction 70%  . Ejection fraction     EF 65-70%, echo, 2007 /  EF 70%, nuclear, 2011  . Atrial fibrillation     Paroxysmal  . Warfarin anticoagulation     Low CHADS , score, but patient wants to be aggressive  . Drug intolerance     Mild beta blocker intolerance  . Incomplete RBBB   .  Syncope     November, 2010  . Carotid bruit     Doppler November, 2011, normal  . Knee pain     November, 201  . BPH (benign prostatic hyperplasia)   . Back pain   . Anxiety     Mild  . Dyslipidemia   . Colon polyps   . Palpitations   . IBS (irritable bowel syndrome)   . Personal history of colonic adenomas 11/29/2006    Qualifier: Diagnosis of  By: Genelle Gather CMA, [place]      Past Surgical History  Procedure Laterality Date  . Colonoscopy  multiple  . Back surgery  05/1990    [id number]  . Cardiac catheterization      Patient Active Problem List   Diagnosis Date Noted  . Carotid bruit     Priority: High  . Anxiety     Priority: High  . hyperlipidemia     Priority: High  . Palpitations     Priority: High  . Chest pain 02/23/2013  . Long term (current) use of anticoagulants 10/12/2012  . CAD (coronary artery disease)   . Ejection fraction   . Atrial fibrillation   . Warfarin anticoagulation   . Drug  intolerance   . Incomplete RBBB   . Knee pain   . BENIGN PROSTATIC HYPERTROPHY, HX OF 02/06/2009  . Personal history of colonic adenomas 11/29/2006    ROS   Patient denies fever, chills, headache, sweats, rash, change in vision, change in hearing, chest pain, cough, nausea vomiting, urinary symptoms. All other systems are reviewed and are negative.  PHYSICAL EXAM  Patient is oriented to person time and place. Affect is normal. There is no jugulovenous distention. Lungs are clear. Respiratory effort is nonlabored. Cardiac exam reveals S1 and S2. There no clicks or significant murmurs. The abdomen is soft. Is no peripheral edema. There no musculoskeletal deformities. There are no skin rashes.  Filed Vitals:   04/21/13 1522  BP: 115/65  Pulse: 64  Height: 6\' 2"  (1.88 m)  Weight: 219 lb (99.338 kg)   I reviewed the EKG from his September, 2014 hospital stay. There was no significant change.  ASSESSMENT & PLAN

## 2013-04-21 NOTE — Assessment & Plan Note (Signed)
His lipids are under excellent control. He will continue on atorvastatin 40. Historically he has taken niacin to raise his HDL. He's had a significant positive response. I explained to him that the current approach is not to recommend new niacin to people. However in his case we both agree that it is very reasonable to continue this since he is done so well and because he had such a significant HDL rise.  With today's visit I spent more than 25 minutes with his total care. More than half of this time was with direct contact speaking with him. We had very extensive discussion about the issues I've outlined above.

## 2013-04-21 NOTE — Assessment & Plan Note (Signed)
He has not been having a great deal of symptoms. No change in therapy.

## 2013-04-29 ENCOUNTER — Other Ambulatory Visit: Payer: Self-pay

## 2013-04-29 ENCOUNTER — Telehealth: Payer: Self-pay | Admitting: Cardiology

## 2013-04-29 MED ORDER — DILTIAZEM HCL 60 MG PO TABS: 60.0000 mg | ORAL_TABLET | ORAL | Status: DC | PRN

## 2013-04-29 NOTE — Telephone Encounter (Signed)
New message  Patient has questions regarding his medication. When he picked up from pharmacy was different and he wants to know why? The medication Diltiazen 60mg  12hr, it was changed from a tablet to capsule. Please Advise

## 2013-04-29 NOTE — Telephone Encounter (Signed)
**Note De-Identified Jordan [name] Obfuscation** Pt prefers tablet form over capsule. I sent in new RX to [facility] pharmacy per pt request.

## 2013-04-30 ENCOUNTER — Encounter: Payer: Self-pay | Admitting: Family Medicine

## 2013-04-30 ENCOUNTER — Ambulatory Visit: Payer: Medicare Other | Admitting: Pharmacist

## 2013-04-30 ENCOUNTER — Ambulatory Visit (INDEPENDENT_AMBULATORY_CARE_PROVIDER_SITE_OTHER): Payer: Medicare Other | Admitting: Family Medicine

## 2013-04-30 VITALS — BP 104/68 | HR 65 | Temp 96.8°F | Ht 74.0 in | Wt 218.0 lb

## 2013-04-30 DIAGNOSIS — Z87898 Personal history of other specified conditions: Secondary | ICD-10-CM | POA: Diagnosis not present

## 2013-04-30 DIAGNOSIS — I2510 Atherosclerotic heart disease of native coronary artery without angina pectoris: Secondary | ICD-10-CM | POA: Diagnosis not present

## 2013-04-30 DIAGNOSIS — I4891 Unspecified atrial fibrillation: Secondary | ICD-10-CM | POA: Diagnosis not present

## 2013-04-30 DIAGNOSIS — F411 Generalized anxiety disorder: Secondary | ICD-10-CM

## 2013-04-30 DIAGNOSIS — E785 Hyperlipidemia, unspecified: Secondary | ICD-10-CM

## 2013-04-30 DIAGNOSIS — Z7901 Long term (current) use of anticoagulants: Secondary | ICD-10-CM

## 2013-04-30 DIAGNOSIS — F419 Anxiety disorder, unspecified: Secondary | ICD-10-CM

## 2013-04-30 LAB — POCT INR: INR: 1.7

## 2013-04-30 NOTE — Progress Notes (Signed)
Patient saw Dr Christell Constant today.  Called with INR results.  Charge for INR but no charge for [facility] pharmacist visit since saw Dr Christell Constant.

## 2013-04-30 NOTE — Progress Notes (Signed)
Subjective:    Patient ID: Jordan [name], male    DOB: [birth year], 67 y.o.   MRN: [id number]  HPI Pt here for follow up and management of chronic medical problems. Patient had a recent visit with his cardiologist and from that standpoint everything is stable. He he also sees the urologist for his BPH and following his PSAs. The patient is doing well and has a very good handle of knowledge on his healthcare. He has also had a recent colonoscopy and polyps were found and he will be rescheduled for a repeat colonoscopy in the future.      Patient Active Problem List   Diagnosis Date Noted  . Chest pain 02/23/2013  . Long term (current) use of anticoagulants 10/12/2012  . CAD (coronary artery disease)   . Ejection fraction   . Atrial fibrillation   . Warfarin anticoagulation   . Drug intolerance   . Incomplete RBBB   . Carotid bruit   . Knee pain   . Anxiety   . hyperlipidemia   . Palpitations   . BENIGN PROSTATIC HYPERTROPHY, HX OF 02/06/2009  . Personal history of colonic adenomas 11/29/2006   Outpatient Encounter Prescriptions as of 04/30/2013  Medication Sig  . atorvastatin (LIPITOR) 40 MG tablet Take 1 tablet (40 mg total) by mouth at bedtime.  . Calcium Carb-Cholecalciferol (CALCIUM 600+D3 PO) Take 1 capsule by mouth daily.   . Cholecalciferol (VITAMIN D3) 1000 UNITS CAPS Take 1,000 Units by mouth daily.   Marland Kitchen diltiazem (CARDIZEM CD) 180 MG 24 hr capsule Take 1 capsule (180 mg total) by mouth daily.  Marland Kitchen diltiazem (CARDIZEM) 60 MG tablet Take 1 tablet (60 mg total) by mouth as needed.  . fish oil-omega-3 fatty acids 1000 MG capsule Take 1 g by mouth daily.   . Glucosamine-Chondroitin 1500-1200 MG/30ML LIQD Take 1,500 mg by mouth daily.   . Multiple Vitamins-Minerals (MULTIVITAMIN WITH MINERALS) tablet Take 1 tablet by mouth daily.   . niacin (NIASPAN) 1000 MG CR tablet Take 1 tablet (1,000 mg total) by mouth daily with lunch.  . nitroGLYCERIN (NITROSTAT) 0.4 MG SL tablet Place  1 tablet (0.4 mg total) under the tongue every 5 (five) minutes as needed for chest pain.  Marland Kitchen warfarin (COUMADIN) 2.5 MG tablet Take 1 tablet Daily    Review of Systems  Constitutional: Negative.   HENT: Negative.   Eyes: Negative.   Respiratory: Negative.   Cardiovascular: Negative.   Gastrointestinal: Negative.   Endocrine: Negative.   Genitourinary: Negative.   Musculoskeletal: Negative.   Skin: Negative.   Allergic/Immunologic: Negative.   Neurological: Negative.   Hematological: Negative.   Psychiatric/Behavioral: Negative.        Objective:   Physical Exam  Nursing note and vitals reviewed. Constitutional: He is oriented to person, place, and time. He appears well-developed and well-nourished. No distress.  HENT:  Head: Normocephalic and atraumatic.  Right Ear: External ear normal.  Left Ear: External ear normal.  Mouth/Throat: Oropharynx is clear and moist. No oropharyngeal exudate.  Nasal congestion bilaterally  Eyes: Conjunctivae and EOM are normal. Pupils are equal, round, and reactive to light. Right eye exhibits no discharge. Left eye exhibits no discharge. No scleral icterus.  Neck: Normal range of motion. Neck supple. No tracheal deviation present. No thyromegaly present.  No carotid bruits  Cardiovascular: Normal rate, regular rhythm, normal heart sounds and intact distal pulses.  Exam reveals no gallop and no friction rub.   No murmur heard. At 60 per minute  Pulmonary/Chest: Effort normal and breath sounds normal. No respiratory distress. He has no wheezes. He has no rales. He exhibits no tenderness.  Abdominal: Soft. Bowel sounds are normal. He exhibits no mass. There is no tenderness. There is no rebound and no guarding.  Musculoskeletal: Normal range of motion. He exhibits edema. He exhibits no tenderness.  Slight pedal edema but good pulses  Lymphadenopathy:    He has no cervical adenopathy.  Neurological: He is alert and oriented to person, place, and  time. He has normal reflexes. No cranial nerve deficit.  Skin: Skin is warm and dry. No rash noted. No erythema. No pallor.  Psychiatric: He has a normal mood and affect. His behavior is normal. Judgment and thought content normal.   BP 104/68  Pulse 65  Temp(Src) 96.8 F (36 C) (Oral)  Ht 6\' 2"  (1.88 m)  Wt 218 lb (98.884 kg)  BMI 27.98 kg/m2        Assessment & Plan:   1. hyperlipidemia   2. Anxiety   3. Atrial fibrillation   4. BENIGN PROSTATIC HYPERTROPHY, HX OF   5. CAD (coronary artery disease)   6. A-fib   7. Warfarin anticoagulation    No orders of the defined types were placed in this encounter.   No orders of the defined types were placed in this encounter.    Patient Instructions  Continue current medications. Continue good therapeutic lifestyle changes which include good diet and exercise. Fall precautions discussed with patient. If you are over 28 years old - you may need Prevnar 13 or the adult Pneumonia vaccine. Try to get your Prevnar sometime in late May or early June    Nyra Capes MD

## 2013-04-30 NOTE — Patient Instructions (Addendum)
Continue current medications. Continue good therapeutic lifestyle changes which include good diet and exercise. Fall precautions discussed with patient. If you are over 66 years old - you may need Prevnar 13 or the adult Pneumonia vaccine. Try to get your Prevnar sometime in late May or early June

## 2013-05-20 ENCOUNTER — Other Ambulatory Visit: Payer: Self-pay | Admitting: Family Medicine

## 2013-06-02 ENCOUNTER — Ambulatory Visit (INDEPENDENT_AMBULATORY_CARE_PROVIDER_SITE_OTHER): Payer: Medicare Other | Admitting: Pharmacist

## 2013-06-02 DIAGNOSIS — I4891 Unspecified atrial fibrillation: Secondary | ICD-10-CM | POA: Diagnosis not present

## 2013-06-02 DIAGNOSIS — Z7901 Long term (current) use of anticoagulants: Secondary | ICD-10-CM | POA: Diagnosis not present

## 2013-06-02 LAB — POCT INR: INR: 2.3

## 2013-06-02 NOTE — Patient Instructions (Signed)
Anticoagulation Dose Instructions as of 06/02/2013     Jordan [name] Tue Wed Thu Fri Sat   New Dose 2.5 mg 2.5 mg 2.5 mg 5 mg 2.5 mg 2.5 mg 5 mg    Description       Continue 1 tablet on Wednesdays and Saturdays and 1/2 tablet all other days      INR was 2.3 today

## 2013-06-09 ENCOUNTER — Telehealth: Payer: Self-pay | Admitting: *Deleted

## 2013-06-09 MED ORDER — AMOXICILLIN 500 MG PO CAPS: 500.0000 mg | ORAL_CAPSULE | Freq: Three times a day (TID) | ORAL | Status: DC

## 2013-06-09 MED ORDER — OSELTAMIVIR PHOSPHATE 75 MG PO CAPS: 75.0000 mg | ORAL_CAPSULE | Freq: Every day | ORAL | Status: DC

## 2013-06-09 NOTE — Telephone Encounter (Signed)
Patients wife diagnosed with strep on Friday and has been exposed to the flu is there anyway we can send in  Antibiotic and tamilflu. Patient is complaining with sore throat, body ache, congestion and runny nose,

## 2013-06-09 NOTE — Telephone Encounter (Signed)
Per dwm meds were sent to pharm and pt aware

## 2013-06-16 ENCOUNTER — Other Ambulatory Visit: Payer: Self-pay | Admitting: Dermatology

## 2013-06-16 DIAGNOSIS — D2370 Other benign neoplasm of skin of unspecified lower limb, including hip: Secondary | ICD-10-CM | POA: Diagnosis not present

## 2013-06-16 DIAGNOSIS — Z85828 Personal history of other malignant neoplasm of skin: Secondary | ICD-10-CM | POA: Diagnosis not present

## 2013-06-16 DIAGNOSIS — L570 Actinic keratosis: Secondary | ICD-10-CM | POA: Diagnosis not present

## 2013-06-16 DIAGNOSIS — L820 Inflamed seborrheic keratosis: Secondary | ICD-10-CM | POA: Diagnosis not present

## 2013-06-16 DIAGNOSIS — D485 Neoplasm of uncertain behavior of skin: Secondary | ICD-10-CM | POA: Diagnosis not present

## 2013-06-16 DIAGNOSIS — L821 Other seborrheic keratosis: Secondary | ICD-10-CM | POA: Diagnosis not present

## 2013-07-30 ENCOUNTER — Telehealth: Payer: Self-pay | Admitting: Pharmacist

## 2013-07-30 NOTE — Telephone Encounter (Signed)
Appt made for 07/31/13 at 11 am. Patient aware.

## 2013-07-31 ENCOUNTER — Ambulatory Visit (INDEPENDENT_AMBULATORY_CARE_PROVIDER_SITE_OTHER): Payer: Medicare Other | Admitting: Pharmacist

## 2013-07-31 DIAGNOSIS — I4891 Unspecified atrial fibrillation: Secondary | ICD-10-CM | POA: Diagnosis not present

## 2013-07-31 DIAGNOSIS — Z7901 Long term (current) use of anticoagulants: Secondary | ICD-10-CM

## 2013-07-31 LAB — POCT INR: INR: 2

## 2013-07-31 NOTE — Patient Instructions (Signed)
Anticoagulation Dose Instructions as of 07/31/2013     Jordan [name] Tue Wed Thu Fri Sat   New Dose 2.5 mg 2.5 mg 2.5 mg 5 mg 2.5 mg 2.5 mg 5 mg    Description       Continue 1 tablet on Wednesdays and Saturdays and 1/2 tablet all other days      INR was 2.0 today

## 2013-08-23 ENCOUNTER — Ambulatory Visit (INDEPENDENT_AMBULATORY_CARE_PROVIDER_SITE_OTHER): Payer: Medicare Other | Admitting: Nurse Practitioner

## 2013-08-23 VITALS — BP 110/70 | HR 81 | Temp 98.3°F | Ht 74.0 in | Wt 218.2 lb

## 2013-08-23 DIAGNOSIS — R52 Pain, unspecified: Secondary | ICD-10-CM

## 2013-08-23 DIAGNOSIS — J111 Influenza due to unidentified influenza virus with other respiratory manifestations: Secondary | ICD-10-CM

## 2013-08-23 DIAGNOSIS — J101 Influenza due to other identified influenza virus with other respiratory manifestations: Secondary | ICD-10-CM

## 2013-08-23 LAB — POCT INFLUENZA A/B
INFLUENZA A, POC: NEGATIVE
INFLUENZA B, POC: POSITIVE

## 2013-08-23 MED ORDER — OSELTAMIVIR PHOSPHATE 75 MG PO CAPS: 75.0000 mg | ORAL_CAPSULE | Freq: Two times a day (BID) | ORAL | Status: DC

## 2013-08-23 NOTE — Progress Notes (Signed)
   Subjective:    Patient ID: Jordan [name], male    DOB: [birth year], 68 y.o.   MRN: [id number]  HPI Patient in c/o cough and congestion that mainly started yesterday- was exposed to flu last weekend while at the beach.    Review of Systems  Constitutional: Positive for fever. Negative for chills.  HENT: Positive for congestion and rhinorrhea.   Respiratory: Positive for cough.   Cardiovascular: Negative.   Gastrointestinal: Negative.   Genitourinary: Negative.   Neurological: Negative.   All other systems reviewed and are negative.       Objective:   Physical Exam  Constitutional: He is oriented to person, place, and time. He appears well-developed and well-nourished.  HENT:  Right Ear: Hearing, tympanic membrane, external ear and ear canal normal.  Left Ear: Hearing, tympanic membrane, external ear and ear canal normal.  Nose: Mucosal edema and rhinorrhea present. Right sinus exhibits no maxillary sinus tenderness and no frontal sinus tenderness. Left sinus exhibits no maxillary sinus tenderness and no frontal sinus tenderness.  Mouth/Throat: Uvula is midline, oropharynx is clear and moist and mucous membranes are normal.  Eyes: Pupils are equal, round, and reactive to light.  Neck: Normal range of motion. Neck supple.  Cardiovascular: Normal rate, regular rhythm and normal heart sounds.   Pulmonary/Chest: Effort normal and breath sounds normal.  Tight dry cough   Lymphadenopathy:    He has no cervical adenopathy.  Neurological: He is alert and oriented to person, place, and time.  Skin: Skin is warm and dry.  Psychiatric: He has a normal mood and affect. His behavior is normal. Judgment and thought content normal.   BP 110/70  Pulse 81  Temp(Src) 98.3 F (36.8 C) (Oral)  Ht 6\' 2"  (1.88 m)  Wt 218 lb 3.2 oz (98.975 kg)  BMI 28.00 kg/m2        Assessment & Plan:   1. Body aches   2. Influenza B    Meds ordered this encounter  Medications  . oseltamivir  (TAMIFLU) 75 MG capsule    Sig: Take 1 capsule (75 mg total) by mouth 2 (two) times daily.    Dispense:  10 capsule    Refill:  0    Order Specific Question:  Supervising Provider    Answer:  Chipper Herb [[id number]]    1. Take meds as prescribed 2. Use a cool mist humidifier especially during the winter months and when heat has been humid. 3. Use saline nose sprays frequently 4. Saline irrigations of the nose can be very helpful if done frequently.  * 4X daily for 1 week*  * Use of a nettie pot can be helpful with this. Follow directions with this* 5. Drink plenty of fluids 6. Keep thermostat turn down low 7.For any cough or congestion  Use plain Mucinex- regular strength or max strength is fine   * Children- consult with Pharmacist for dosing 8. For fever or aces or pains- take tylenol or ibuprofen appropriate for age and weight.  * for fevers greater than 101 orally you may alternate ibuprofen and tylenol every  3 hours.   Mary-Margaret Hassell Done, FNP

## 2013-08-23 NOTE — Patient Instructions (Signed)

## 2013-09-25 ENCOUNTER — Telehealth: Payer: Self-pay | Admitting: Pharmacist

## 2013-09-25 NOTE — Telephone Encounter (Signed)
Rescheduled for Monday 09/29/13 at 8am.

## 2013-09-25 NOTE — Telephone Encounter (Signed)
Schedule today is full and meeting during lunch.  Can fit in at either 8am or 11:50 on Monday.  - left message for patient to call if her wants those appts

## 2013-09-29 ENCOUNTER — Ambulatory Visit (INDEPENDENT_AMBULATORY_CARE_PROVIDER_SITE_OTHER): Payer: Medicare Other | Admitting: Pharmacist

## 2013-09-29 DIAGNOSIS — I4891 Unspecified atrial fibrillation: Secondary | ICD-10-CM | POA: Diagnosis not present

## 2013-09-29 DIAGNOSIS — Z7901 Long term (current) use of anticoagulants: Secondary | ICD-10-CM

## 2013-09-29 LAB — POCT INR: INR: 3.2

## 2013-09-29 MED ORDER — WARFARIN SODIUM 2.5 MG PO TABS: ORAL_TABLET | ORAL | Status: DC

## 2013-09-29 NOTE — Patient Instructions (Signed)
Anticoagulation Dose Instructions as of 09/29/2013     Dorene Grebe Tue Wed Thu Fri Sat   New Dose 2.5 mg 2.5 mg 2.5 mg 5 mg 2.5 mg 2.5 mg 5 mg    Description       No warfarin for 1 day, then continue 1 tablet on Wednesdays and Saturdays and 1/2 tablet all other days.      INR was 3.2 today

## 2013-10-01 ENCOUNTER — Encounter: Payer: Self-pay | Admitting: *Deleted

## 2013-10-06 ENCOUNTER — Telehealth: Payer: Self-pay | Admitting: Pharmacist

## 2013-10-06 DIAGNOSIS — M9981 Other biomechanical lesions of cervical region: Secondary | ICD-10-CM | POA: Diagnosis not present

## 2013-10-06 DIAGNOSIS — M999 Biomechanical lesion, unspecified: Secondary | ICD-10-CM | POA: Diagnosis not present

## 2013-10-06 DIAGNOSIS — M546 Pain in thoracic spine: Secondary | ICD-10-CM | POA: Diagnosis not present

## 2013-10-06 DIAGNOSIS — M47817 Spondylosis without myelopathy or radiculopathy, lumbosacral region: Secondary | ICD-10-CM | POA: Diagnosis not present

## 2013-10-06 DIAGNOSIS — M4712 Other spondylosis with myelopathy, cervical region: Secondary | ICD-10-CM | POA: Diagnosis not present

## 2013-10-06 MED ORDER — WARFARIN SODIUM 2.5 MG PO TABS
ORAL_TABLET | ORAL | Status: DC
Start: 2013-10-06 — End: 2013-10-20

## 2013-10-06 NOTE — Telephone Encounter (Signed)
rx resent to [facility]

## 2013-10-20 ENCOUNTER — Encounter: Payer: Self-pay | Admitting: Cardiology

## 2013-10-20 ENCOUNTER — Ambulatory Visit (INDEPENDENT_AMBULATORY_CARE_PROVIDER_SITE_OTHER): Payer: Medicare Other | Admitting: Cardiology

## 2013-10-20 ENCOUNTER — Ambulatory Visit (INDEPENDENT_AMBULATORY_CARE_PROVIDER_SITE_OTHER): Payer: Medicare Other | Admitting: Pharmacist

## 2013-10-20 VITALS — BP 116/77 | HR 60 | Ht 74.0 in | Wt 217.8 lb

## 2013-10-20 DIAGNOSIS — I25810 Atherosclerosis of coronary artery bypass graft(s) without angina pectoris: Secondary | ICD-10-CM | POA: Diagnosis not present

## 2013-10-20 DIAGNOSIS — I4891 Unspecified atrial fibrillation: Secondary | ICD-10-CM

## 2013-10-20 DIAGNOSIS — R0989 Other specified symptoms and signs involving the circulatory and respiratory systems: Secondary | ICD-10-CM | POA: Diagnosis not present

## 2013-10-20 DIAGNOSIS — R079 Chest pain, unspecified: Secondary | ICD-10-CM | POA: Diagnosis not present

## 2013-10-20 DIAGNOSIS — E785 Hyperlipidemia, unspecified: Secondary | ICD-10-CM

## 2013-10-20 DIAGNOSIS — Z7901 Long term (current) use of anticoagulants: Secondary | ICD-10-CM

## 2013-10-20 DIAGNOSIS — I2510 Atherosclerotic heart disease of native coronary artery without angina pectoris: Secondary | ICD-10-CM

## 2013-10-20 LAB — BASIC METABOLIC PANEL
BUN: 24 mg/dL — ABNORMAL HIGH (ref 6–23)
CALCIUM: 8.9 mg/dL (ref 8.4–10.5)
CO2: 29 mEq/L (ref 19–32)
CREATININE: 0.9 mg/dL (ref 0.4–1.5)
Chloride: 105 mEq/L (ref 96–112)
GFR: 86.24 mL/min (ref >60.00)
Glucose, Bld: 89 mg/dL (ref 70–99)
Potassium: 4.3 mEq/L (ref 3.5–5.1)
Sodium: 140 mEq/L (ref 135–145)

## 2013-10-20 LAB — CBC WITH DIFFERENTIAL/PLATELET
BASOS ABS: 0 10*3/uL (ref 0.0–0.1)
Basophils Relative: 0.4 % (ref 0.0–3.0)
EOS ABS: 0.3 10*3/uL (ref 0.0–0.7)
Eosinophils Relative: 3.8 % (ref 0.0–5.0)
HCT: 45.4 % (ref 39.0–52.0)
Hemoglobin: 15.1 g/dL (ref 13.0–17.0)
LYMPHS ABS: 1.4 10*3/uL (ref 0.7–4.0)
Lymphocytes Relative: 17.2 % (ref 12.0–46.0)
MCHC: 33.3 g/dL (ref 30.0–36.0)
MCV: 89.8 fl (ref 78.0–100.0)
Monocytes Absolute: 0.7 10*3/uL (ref 0.1–1.0)
Monocytes Relative: 8 % (ref 3.0–12.0)
NEUTROS ABS: 5.9 10*3/uL (ref 1.4–7.7)
Neutrophils Relative %: 70.6 % (ref 43.0–77.0)
Platelets: 212 10*3/uL (ref 150.0–400.0)
RBC: 5.06 Mil/uL (ref 4.22–5.81)
RDW: 14.3 % (ref 11.5–15.5)
WBC: 8.4 10*3/uL (ref 4.0–10.5)

## 2013-10-20 LAB — POCT INR: INR: 2.4

## 2013-10-20 MED ORDER — DILTIAZEM HCL ER COATED BEADS 180 MG PO CP24: 180.0000 mg | ORAL_CAPSULE | Freq: Every day | ORAL | Status: DC

## 2013-10-20 MED ORDER — APIXABAN 5 MG PO TABS: 5.0000 mg | ORAL_TABLET | Freq: Two times a day (BID) | ORAL | Status: DC

## 2013-10-20 MED ORDER — DILTIAZEM HCL 60 MG PO TABS: 60.0000 mg | ORAL_TABLET | ORAL | Status: DC | PRN

## 2013-10-20 NOTE — Patient Instructions (Addendum)
**Note De-Identified Jordan [name] Obfuscation** Your physician has recommended you make the following change in your medication: stop taking Coumadin and start taking Eliquis 5 mg twice daily  Your physician recommends that you return for lab work in: today  Your physician wants you to follow-up in: 6 months. You will receive a reminder letter in the mail two months in advance. If you don't receive a letter, please call our office to schedule the follow-up appointment.

## 2013-10-20 NOTE — Assessment & Plan Note (Signed)
There was a soft carotid bruit in the past. His Doppler in 2011 was normal. I am not inclined to repeat a Doppler at this time. However sometime in the future it will be appropriate to do another study.

## 2013-10-20 NOTE — Progress Notes (Signed)
Patient ID: Jordan [name], male   DOB: 03/24/1946, 68 y.o.   MRN: [id number]    HPI  Patient is seen to followup coronary disease, paroxysmal atrial fibrillation, Coumadin therapy, lipid therapy. Patient is a very intelligent gentleman and we had a long discussion about multiple issues. He is not having any significant symptoms. No chest pain. No palpitations. He has been on Coumadin.   Allergies  Allergen Reactions  . Levofloxacin Other (See Comments)    Couldn't breathe, passed out  . Lopid [Gemfibrozil] Other (See Comments)    Loose bowels  . Sulfonamide Derivatives Hives    Current Outpatient Prescriptions  Medication Sig Dispense Refill  . atorvastatin (LIPITOR) 40 MG tablet Take 1 tablet (40 mg total) by mouth at bedtime.  30 tablet  0  . Calcium Carb-Cholecalciferol (CALCIUM 600+D3 PO) Take 1 capsule by mouth daily.       . Cholecalciferol (VITAMIN D3) 1000 UNITS CAPS Take 1,000 Units by mouth daily.       Marland Kitchen diltiazem (CARDIZEM CD) 180 MG 24 hr capsule Take 1 capsule (180 mg total) by mouth daily.  30 capsule  6  . diltiazem (CARDIZEM) 60 MG tablet Take 1 tablet (60 mg total) by mouth as needed.  30 tablet  2  . fish oil-omega-3 fatty acids 1000 MG capsule Take 1 g by mouth daily.       . Glucosamine-Chondroitin 1500-1200 MG/30ML LIQD Take 1,500 mg by mouth daily.       . Multiple Vitamins-Minerals (MULTIVITAMIN WITH MINERALS) tablet Take 1 tablet by mouth daily.       . niacin (NIASPAN) 500 MG CR tablet TAKE 2 TABLETS DAILY AS DIRECTED      . nitroGLYCERIN (NITROSTAT) 0.4 MG SL tablet Place 1 tablet (0.4 mg total) under the tongue every 5 (five) minutes as needed for chest pain.  25 tablet  3  . warfarin (COUMADIN) 2.5 MG tablet Take 1 or 2 tablets by mouth daily as directed by coumadin clinic  60 tablet  3   No current facility-administered medications for this visit.    History   Social History  . Marital Status: Married    Spouse Name: N/A    Number of Children: N/A    . Years of Education: N/A   Occupational History  . 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 Smoking status: Never Smoker   . Smokeless tobacco: Never Used  . Alcohol Use: No  . Drug Use: No  . Sexual Activity: Not on file   Other Topics Concern  . Not on file   Social History Narrative  . No narrative on file    Family History  Problem Relation Age of Onset  . Heart attack Mother   . Heart attack Brother     Past Medical History  Diagnosis Date  . CAD (coronary artery disease)     Stent LAD, 2002  /  nuclear 2009, no ischemia  /    [facility] October, 2011 nuclear, no ischemia, possible slight apical scar, ejection fraction 70%  . Ejection fraction     EF 65-70%, echo, 2007 /  EF 70%, nuclear, 2011  . Atrial fibrillation     Paroxysmal  . Warfarin anticoagulation     Low CHADS , score, but patient wants to be aggressive  . Drug intolerance     Mild beta blocker intolerance  . Incomplete RBBB   . Syncope  November, 2010  . Carotid bruit     Doppler November, 2011, normal  . Knee pain     November, 201  . BPH (benign prostatic hyperplasia)   . Back pain   . Anxiety     Mild  . Dyslipidemia   . Colon polyps   . Palpitations   . IBS (irritable bowel syndrome)   . Personal history of colonic adenomas 11/29/2006    Qualifier: Diagnosis of  By: [place], [place]      Past Surgical History  Procedure Laterality Date  . Colonoscopy  multiple  . Back surgery  05/1990    [id number]  . Cardiac catheterization      Patient Active Problem List   Diagnosis Date Noted  . Carotid bruit     Priority: High  . Anxiety     Priority: High  . hyperlipidemia     Priority: High  . Palpitations     Priority: High  . Chest pain 02/23/2013  . Long term (current) use of anticoagulants 10/12/2012  . CAD (coronary artery disease)   . Ejection fraction   . Atrial fibrillation   . Warfarin anticoagulation   . Drug intolerance   .  Incomplete RBBB   . Knee pain   . BENIGN PROSTATIC HYPERTROPHY, HX OF 02/06/2009  . Personal history of colonic adenomas 11/29/2006    ROS   Patient denies fever, chills, headache, sweats, rash, change in vision, change in hearing, chest pain, cough, nausea vomiting, urinary symptoms. All other systems are reviewed and are negative.  PHYSICAL EXAM  Patient is oriented to person time and place. Affect is normal. He looks quite good. Head is atraumatic. Sclera and conjunctiva are normal. There is no jugulovenous distention. Lungs are clear. Respiratory effort is nonlabored. Cardiac exam reveals an S1 and S2. There no clicks or significant murmurs. Abdomen is soft. There is no peripheral edema. There are no musculoskeletal deformities. There are no skin rashes.  Filed Vitals:   10/20/13 0831  BP: 116/77  Pulse: 60  Height: 6\' 2"  (1.88 m)  Weight: 217 lb 12.8 oz (98.793 kg)   EKG is done today and reviewed by me. 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 There is no significant change from the past.  ASSESSMENT & PLAN

## 2013-10-20 NOTE — Assessment & Plan Note (Signed)
The patient's lipids are followed very carefully by his primary team. Historically he has been on niacin. In the past it is his impression that it was Niaspan that led to improvement in his HDL. Ultimately atorvastatin was started and he is now on both. He and I were talking about whether or not he should stop his Niaspan. I have reviewed the specifics of his NMR lipid study with our pharmacology team. His current values are excellent. As of today I am comfortable with his continuing his current regimen of atorvastatin and Niaspan 1000 mg daily. I had a long discussion with the patient about this.

## 2013-10-20 NOTE — Assessment & Plan Note (Signed)
Coronary disease is stable. He had a stent to the LAD in 2002. He has not had any recurrent chest pain recently. He had a nuclear scan in 2009 and 2011 with no significant ischemia. His ejection fraction remains normal. No change in therapy. Because he is fully anticoagulated and he has not had an unstable cardiac situation recently, we stopped his aspirin.

## 2013-10-20 NOTE — Patient Instructions (Addendum)
1.  Stop coumadin today. 2.  Start Eliquis 5 mg twice daily with food, tomorrow evening as need to wait until INR is slightly lower. 3.  Come back in 4 weeks to check labs (BMET / CBC) and see pharmacist here.

## 2013-10-20 NOTE — Assessment & Plan Note (Addendum)
He's been maintaining sinus rhythm. He is anticoagulated. He has been on Coumadin for many years. Over time he and I have talked about the new agents. He is also talked with his primary care team. I am comfortable now with him switching to Eliquis. We had a lengthy discussion about this. Today his INR will be checked. Our team will help transition him to Eliquis. We will do this by making sure that his INR is below 2.0. We will also be sure that his renal function and CBC are stable. We will then arrange for him to obtain the drug. He knows to take it twice a day with food. He knows he can liberalize his diet. He had a specific question about green tea. He was annoyed asked our pharmacology team about this.   As part of today's evaluation I spent greater than 25 minutes with the patient's total care. More than half of this amount of time was spent in direct contact with him. We had a very long discussion about his lipids and about his anticoagulation.

## 2013-10-21 ENCOUNTER — Telehealth: Payer: Self-pay | Admitting: *Deleted

## 2013-10-21 NOTE — Telephone Encounter (Signed)
PA to [employer] for patients ELIQUIS.

## 2013-10-21 NOTE — Telephone Encounter (Signed)
[employer] rx approved eliquis through 10/21/2013, PA # [id number]

## 2013-10-29 ENCOUNTER — Ambulatory Visit: Payer: Medicare Other | Admitting: Family Medicine

## 2013-10-30 DIAGNOSIS — R972 Elevated prostate specific antigen [PSA]: Secondary | ICD-10-CM | POA: Diagnosis not present

## 2013-10-31 ENCOUNTER — Ambulatory Visit: Payer: Medicare Other | Admitting: Family Medicine

## 2013-11-05 DIAGNOSIS — N529 Male erectile dysfunction, unspecified: Secondary | ICD-10-CM | POA: Diagnosis not present

## 2013-11-05 DIAGNOSIS — N138 Other obstructive and reflux uropathy: Secondary | ICD-10-CM | POA: Diagnosis not present

## 2013-11-05 DIAGNOSIS — N401 Enlarged prostate with lower urinary tract symptoms: Secondary | ICD-10-CM | POA: Diagnosis not present

## 2013-11-05 DIAGNOSIS — N139 Obstructive and reflux uropathy, unspecified: Secondary | ICD-10-CM | POA: Diagnosis not present

## 2013-11-05 DIAGNOSIS — R972 Elevated prostate specific antigen [PSA]: Secondary | ICD-10-CM | POA: Diagnosis not present

## 2013-11-07 ENCOUNTER — Encounter: Payer: Self-pay | Admitting: Family Medicine

## 2013-11-07 ENCOUNTER — Ambulatory Visit (INDEPENDENT_AMBULATORY_CARE_PROVIDER_SITE_OTHER): Payer: Medicare Other | Admitting: Family Medicine

## 2013-11-07 VITALS — BP 104/70 | HR 66 | Temp 98.3°F | Ht 74.0 in | Wt 217.0 lb

## 2013-11-07 DIAGNOSIS — I2510 Atherosclerotic heart disease of native coronary artery without angina pectoris: Secondary | ICD-10-CM | POA: Diagnosis not present

## 2013-11-07 DIAGNOSIS — Z7901 Long term (current) use of anticoagulants: Secondary | ICD-10-CM | POA: Diagnosis not present

## 2013-11-07 DIAGNOSIS — E785 Hyperlipidemia, unspecified: Secondary | ICD-10-CM

## 2013-11-07 DIAGNOSIS — F411 Generalized anxiety disorder: Secondary | ICD-10-CM

## 2013-11-07 DIAGNOSIS — Z79899 Other long term (current) drug therapy: Secondary | ICD-10-CM

## 2013-11-07 DIAGNOSIS — I4891 Unspecified atrial fibrillation: Secondary | ICD-10-CM

## 2013-11-07 DIAGNOSIS — F419 Anxiety disorder, unspecified: Secondary | ICD-10-CM

## 2013-11-07 DIAGNOSIS — E559 Vitamin D deficiency, unspecified: Secondary | ICD-10-CM

## 2013-11-07 DIAGNOSIS — Z23 Encounter for immunization: Secondary | ICD-10-CM | POA: Diagnosis not present

## 2013-11-07 NOTE — Progress Notes (Signed)
Subjective:    Patient ID: Jordan [name], male    DOB: [birth year], 68 y.o.   MRN: [id number]  HPI Patient comes in today for 6 month follow up on chronic medical conditions. No complaints at this time. He was recently switched from Coumadin to Eliquis and is following up with his cardiologist in the next few weeks. The patient sees Dr. Ron Parker. Lab work today. He is also due to see the Prevnar vaccine. He is due to return on FOBT. His next colonoscopy is not due until 2017. He also is concerned today about his toenail fungus.    Review of Systems  All other systems reviewed and are negative.      Objective:   Physical Exam  Nursing note and vitals reviewed. Constitutional: He is oriented to person, place, and time. He appears well-developed and well-nourished.  HENT:  Head: Normocephalic and atraumatic.  Right Ear: External ear normal.  Left Ear: External ear normal.  Nose: Nose normal.  Mouth/Throat: Oropharynx is clear and moist. No oropharyngeal exudate.  Eyes: Conjunctivae and EOM are normal. Pupils are equal, round, and reactive to light. Right eye exhibits no discharge. Left eye exhibits no discharge. No scleral icterus.  Neck: Normal range of motion. Neck supple. No tracheal deviation present. No thyromegaly present.  Cardiovascular: Normal rate, regular rhythm, normal heart sounds and intact distal pulses.  Exam reveals no gallop and no friction rub.   No murmur heard. The rhythm is fairly regular today at 72 per minute  Pulmonary/Chest: Effort normal and breath sounds normal. No respiratory distress. He has no wheezes. He has no rales. He exhibits no tenderness.  Abdominal: Soft. Bowel sounds are normal. He exhibits no mass. There is no tenderness. There is no rebound and no guarding.  Musculoskeletal: Normal range of motion. He exhibits edema (there is slight pedal edema bilaterally and varicosities.). He exhibits no tenderness.  Lymphadenopathy:    He has no cervical  adenopathy.  Neurological: He is alert and oriented to person, place, and time. He has normal reflexes. No cranial nerve deficit.  Skin: Skin is warm and dry. No rash noted.  Psychiatric: He has a normal mood and affect. His behavior is normal. Judgment and thought content normal.    BP 104/70  Pulse 66  Temp(Src) 98.3 F (36.8 C) (Oral)  Ht 6\' 2"  (1.88 m)  Wt 217 lb (98.431 kg)  BMI 27.85 kg/m2       Assessment & Plan:  1. Long term (current) use of anticoagulants  2. A-fib  3. Anxiety  4. CAD (coronary artery disease)  5. Dyslipidemia - Hepatic function panel - NMR, lipoprofile  6. Unspecified vitamin D deficiency - Vit D  25 hydroxy (rtn osteoporosis monitoring)  7. Need for pneumococcal vaccination - Pneumococcal conjugate vaccine 13-valent  8. High risk medication use - NMR, lipoprofile  Patient Instructions  Continue current medications. Continue good therapeutic lifestyle changes which include good diet and exercise. Fall precautions discussed with patient. If an FOBT was given today- please return it to our front desk. If you are over 30 years old - you may need Prevnar 54 or the adult Pneumonia vaccine.  Pneumococcal Vaccine, Polyvalent suspension for injection What is this medicine? PNEUMOCOCCAL VACCINE, POLYVALENT (NEU mo KOK al vak SEEN, pol ee VEY luhnt) is a vaccine to prevent pneumococcus bacteria infection. These bacteria are a major cause of ear infections, 'Strep throat' infections, and serious pneumonia, meningitis, or blood infections worldwide. These vaccines help the  body to produce antibodies (protective substances) that help your body defend against these bacteria. This vaccine is recommended for infants and young children. This vaccine will not treat an infection. This medicine may be used for other purposes; ask your health care provider or pharmacist if you have questions. COMMON BRAND NAME(S): Prevnar 13 , Prevnar What should I tell my  health care provider before I take this medicine? They need to know if you have any of these conditions: -bleeding problems -fever -immune system problems -low platelet count in the blood -seizures -an unusual or allergic reaction to pneumococcal vaccine, diphtheria toxoid, other vaccines, latex, other medicines, foods, dyes, or preservatives -pregnant or trying to get pregnant -breast-feeding How should I use this medicine? This vaccine is for injection into a muscle. It is given by a health care professional. A copy of Vaccine Information Statements will be given before each vaccination. Read this sheet carefully each time. The sheet may change frequently. Talk to your pediatrician regarding the use of this medicine in children. While this drug may be prescribed for children as young as 62 weeks old for selected conditions, precautions do apply. Overdosage: If you think you have taken too much of this medicine contact a poison control center or emergency room at once. NOTE: This medicine is only for you. Do not share this medicine with others. What if I miss a dose? It is important not to miss your dose. Call your doctor or health care professional if you are unable to keep an appointment. What may interact with this medicine? -medicines for cancer chemotherapy -medicines that suppress your immune function -medicines that treat or prevent blood clots like warfarin, enoxaparin, and dalteparin -steroid medicines like prednisone or cortisone This list may not describe all possible interactions. Give your health care provider a list of all the medicines, herbs, non-prescription drugs, or dietary supplements you use. Also tell them if you smoke, drink alcohol, or use illegal drugs. Some items may interact with your medicine. What should I watch for while using this medicine? Mild fever and pain should go away in 3 days or less. Report any unusual symptoms to your doctor or health care  professional. What side effects may I notice from receiving this medicine? Side effects that you should report to your doctor or health care professional as soon as possible: -allergic reactions like skin rash, itching or hives, swelling of the face, lips, or tongue -breathing problems -confused -fever over 102 degrees F -pain, tingling, numbness in the hands or feet -seizures -unusual bleeding or bruising -unusual muscle weakness Side effects that usually do not require medical attention (report to your doctor or health care professional if they continue or are bothersome): -aches and pains -diarrhea -fever of 102 degrees F or less -headache -irritable -loss of appetite -pain, tender at site where injected -trouble sleeping This list may not describe all possible side effects. Call your doctor for medical advice about side effects. You may report side effects to FDA at 1-800-FDA-1088. Where should I keep my medicine? This does not apply. This vaccine is given in a clinic, pharmacy, doctor's office, or other health care setting and will not be stored at home. NOTE: This sheet is a summary. It may not cover all possible information. If you have questions about this medicine, talk to your doctor, pharmacist, or health care provider.  2014, Elsevier/Gold Standard. (2008-08-11 10:17:22)  Medicare Annual Wellness Visit  [facility] and the medical providers at [facility] strive to bring you the best medical care.  In doing so we not only want to address your current medical conditions and concerns but also to detect new conditions early and prevent illness, disease and health-related problems.    Medicare offers a yearly Wellness Visit which allows our clinical staff to assess your need for preventative services including immunizations, lifestyle education, counseling to decrease risk of preventable diseases and screening for fall risk and  other medical concerns.    This visit is provided free of charge (no copay) for all Medicare recipients. The clinical pharmacists at [facility] have begun to conduct these Wellness Visits which will also include a thorough review of all your medications.    As you primary medical provider recommend that you make an appointment for your Annual Wellness Visit if you have not done so already this year.  You may set up this appointment before you leave today or you may call back ([phone]) and schedule an appointment.  Please make sure when you call that you mention that you are scheduling your Annual Wellness Visit with the clinical pharmacist so that the appointment may be made for the proper length of time.      Arrie Senate MD

## 2013-11-07 NOTE — Patient Instructions (Addendum)
Continue current medications. Continue good therapeutic lifestyle changes which include good diet and exercise. Fall precautions discussed with patient. If an FOBT was given today- please return it to our front desk. If you are over 67 years old - you may need Prevnar 65 or the adult Pneumonia vaccine.  Pneumococcal Vaccine, Polyvalent suspension for injection What is this medicine? PNEUMOCOCCAL VACCINE, POLYVALENT (NEU mo KOK al vak SEEN, pol ee VEY luhnt) is a vaccine to prevent pneumococcus bacteria infection. These bacteria are a major cause of ear infections, 'Strep throat' infections, and serious pneumonia, meningitis, or blood infections worldwide. These vaccines help the body to produce antibodies (protective substances) that help your body defend against these bacteria. This vaccine is recommended for infants and young children. This vaccine will not treat an infection. This medicine may be used for other purposes; ask your health care provider or pharmacist if you have questions. COMMON BRAND NAME(S): Prevnar 13 , Prevnar What should I tell my health care provider before I take this medicine? They need to know if you have any of these conditions: -bleeding problems -fever -immune system problems -low platelet count in the blood -seizures -an unusual or allergic reaction to pneumococcal vaccine, diphtheria toxoid, other vaccines, latex, other medicines, foods, dyes, or preservatives -pregnant or trying to get pregnant -breast-feeding How should I use this medicine? This vaccine is for injection into a muscle. It is given by a health care professional. A copy of Vaccine Information Statements will be given before each vaccination. Read this sheet carefully each time. The sheet may change frequently. Talk to your pediatrician regarding the use of this medicine in children. While this drug may be prescribed for children as young as 21 weeks old for selected conditions, precautions do  apply. Overdosage: If you think you have taken too much of this medicine contact a poison control center or emergency room at once. NOTE: This medicine is only for you. Do not share this medicine with others. What if I miss a dose? It is important not to miss your dose. Call your doctor or health care professional if you are unable to keep an appointment. What may interact with this medicine? -medicines for cancer chemotherapy -medicines that suppress your immune function -medicines that treat or prevent blood clots like warfarin, enoxaparin, and dalteparin -steroid medicines like prednisone or cortisone This list may not describe all possible interactions. Give your health care provider a list of all the medicines, herbs, non-prescription drugs, or dietary supplements you use. Also tell them if you smoke, drink alcohol, or use illegal drugs. Some items may interact with your medicine. What should I watch for while using this medicine? Mild fever and pain should go away in 3 days or less. Report any unusual symptoms to your doctor or health care professional. What side effects may I notice from receiving this medicine? Side effects that you should report to your doctor or health care professional as soon as possible: -allergic reactions like skin rash, itching or hives, swelling of the face, lips, or tongue -breathing problems -confused -fever over 102 degrees F -pain, tingling, numbness in the hands or feet -seizures -unusual bleeding or bruising -unusual muscle weakness Side effects that usually do not require medical attention (report to your doctor or health care professional if they continue or are bothersome): -aches and pains -diarrhea -fever of 102 degrees F or less -headache -irritable -loss of appetite -pain, tender at site where injected -trouble sleeping This list may not describe all possible  side effects. Call your doctor for medical advice about side effects. You may  report side effects to FDA at 1-800-FDA-1088. Where should I keep my medicine? This does not apply. This vaccine is given in a clinic, pharmacy, doctor's office, or other health care setting and will not be stored at home. NOTE: This sheet is a summary. It may not cover all possible information. If you have questions about this medicine, talk to your doctor, pharmacist, or health care provider.  2014, Elsevier/Gold Standard. (2008-08-11 10:17:22)                        Medicare Annual Wellness Visit  [facility] and the medical providers at [facility] strive to bring you the best medical care.  In doing so we not only want to address your current medical conditions and concerns but also to detect new conditions early and prevent illness, disease and health-related problems.    Medicare offers a yearly Wellness Visit which allows our clinical staff to assess your need for preventative services including immunizations, lifestyle education, counseling to decrease risk of preventable diseases and screening for fall risk and other medical concerns.    This visit is provided free of charge (no copay) for all Medicare recipients. The clinical pharmacists at [facility] have begun to conduct these Wellness Visits which will also include a thorough review of all your medications.    As you primary medical provider recommend that you make an appointment for your Annual Wellness Visit if you have not done so already this year.  You may set up this appointment before you leave today or you may call back ([phone]) and schedule an appointment.  Please make sure when you call that you mention that you are scheduling your Annual Wellness Visit with the clinical pharmacist so that the appointment may be made for the proper length of time.

## 2013-11-17 ENCOUNTER — Other Ambulatory Visit: Payer: Self-pay | Admitting: Family Medicine

## 2013-11-18 ENCOUNTER — Other Ambulatory Visit: Payer: Medicare Other

## 2013-11-18 DIAGNOSIS — E785 Hyperlipidemia, unspecified: Secondary | ICD-10-CM | POA: Diagnosis not present

## 2013-11-18 DIAGNOSIS — Z79899 Other long term (current) drug therapy: Secondary | ICD-10-CM | POA: Diagnosis not present

## 2013-11-18 DIAGNOSIS — E559 Vitamin D deficiency, unspecified: Secondary | ICD-10-CM | POA: Diagnosis not present

## 2013-11-18 NOTE — Progress Notes (Signed)
Pt came in for labs only order on the 29 of last month

## 2013-11-19 ENCOUNTER — Telehealth: Payer: Self-pay | Admitting: Family Medicine

## 2013-11-19 LAB — VITAMIN D 25 HYDROXY (VIT D DEFICIENCY, FRACTURES): VIT D 25 HYDROXY: 51.2 ng/mL (ref 30.0–100.0)

## 2013-11-19 LAB — NMR, LIPOPROFILE
Cholesterol: 120 mg/dL (ref 100–199)
HDL CHOLESTEROL BY NMR: 37 mg/dL — AB (ref >39)
HDL Particle Number: 27.4 umol/L — ABNORMAL LOW (ref >=30.5)
LDL Particle Number: 721 nmol/L (ref <1000)
LDL Size: 20.6 nm (ref >20.5)
LDLC SERPL CALC-MCNC: 68 mg/dL (ref 0–99)
LP-IR Score: 35 (ref <=45)
SMALL LDL PARTICLE NUMBER: 208 nmol/L (ref <=527)
Triglycerides by NMR: 76 mg/dL (ref 0–149)

## 2013-11-19 LAB — HEPATIC FUNCTION PANEL
ALT: 17 IU/L (ref 0–44)
AST: 23 IU/L (ref 0–40)
Albumin: 3.8 g/dL (ref 3.6–4.8)
Alkaline Phosphatase: 55 IU/L (ref 39–117)
BILIRUBIN DIRECT: 0.2 mg/dL (ref 0.00–0.40)
Total Bilirubin: 0.8 mg/dL (ref 0.0–1.2)
Total Protein: 6.1 g/dL (ref 6.0–8.5)

## 2013-11-19 NOTE — Telephone Encounter (Signed)
Message copied by Waverly Ferrari on Wed Nov 19, 2013  9:48 AM ------      Message from: Chipper Herb      Created: Wed Nov 19, 2013  7:26 AM       Vitamin D3 is good and within normal limits at 51.2, continue current treatment      Liver function tests are within normal      Cholesterol numbers bypass lipid testing are excellent and at goal. The LDL particle number is 721. The LDL C. is 68. The HDL P. is decreased at 27.4 this is consistent with past readings.------ continue current treatment ------

## 2013-11-20 ENCOUNTER — Telehealth: Payer: Self-pay | Admitting: Family Medicine

## 2013-11-20 NOTE — Telephone Encounter (Signed)
Pt aware of lab results 

## 2013-11-20 NOTE — Telephone Encounter (Signed)
Patient called - discussed last labs and answered medication questions about Eliquis and other anticoagulants.

## 2013-11-20 NOTE — Telephone Encounter (Signed)
Patient aware.

## 2013-11-24 ENCOUNTER — Ambulatory Visit (INDEPENDENT_AMBULATORY_CARE_PROVIDER_SITE_OTHER): Payer: Medicare Other | Admitting: *Deleted

## 2013-11-24 DIAGNOSIS — I4891 Unspecified atrial fibrillation: Secondary | ICD-10-CM | POA: Diagnosis not present

## 2013-11-24 LAB — CBC
HEMATOCRIT: 42.9 % (ref 39.0–52.0)
Hemoglobin: 14.3 g/dL (ref 13.0–17.0)
MCHC: 33.4 g/dL (ref 30.0–36.0)
MCV: 88.7 fl (ref 78.0–100.0)
PLATELETS: 207 10*3/uL (ref 150.0–400.0)
RBC: 4.83 Mil/uL (ref 4.22–5.81)
RDW: 13.9 % (ref 11.5–15.5)
WBC: 8 10*3/uL (ref 4.0–10.5)

## 2013-11-24 LAB — BASIC METABOLIC PANEL
BUN: 24 mg/dL — AB (ref 6–23)
CO2: 29 meq/L (ref 19–32)
Calcium: 8.8 mg/dL (ref 8.4–10.5)
Chloride: 105 mEq/L (ref 96–112)
Creatinine, Ser: 1 mg/dL (ref 0.4–1.5)
GFR: 77.5 mL/min (ref >60.00)
GLUCOSE: 102 mg/dL — AB (ref 70–99)
POTASSIUM: 4.1 meq/L (ref 3.5–5.1)
SODIUM: 138 meq/L (ref 135–145)

## 2013-11-24 NOTE — Progress Notes (Signed)
Pt was started on Eliquis for Afib on 10/20/13.    Reviewed patients medication list.  Pt is not  currently on any combined P-gp and strong CYP3A4 inhibitors/inducers (ketoconazole, traconazole, ritonavir, carbamazepine, phenytoin, rifampin, St. John's wort).  Reviewed labs.  SCr 1.0 , Weight 98.4Kg.  Age 50yrs.  Dose appropriate  based on specified criteria. Hgb and HCT 14.3 /42.9.   A full discussion of the nature of anticoagulants has been carried out.  A benefit/risk analysis has been presented to the patient, so that they understand the justification for choosing anticoagulation with Eliquis at this time.  The need for compliance is stressed.  Pt is aware to take the medication twice daily.  Side effects of potential bleeding are discussed, including unusual colored urine or stools, coughing up blood or coffee ground emesis, nose bleeds or serious fall or head trauma.  Discussed signs and symptoms of stroke. The patient should avoid any OTC items containing aspirin or ibuprofen.  Avoid alcohol consumption.   Call if any signs of abnormal bleeding.  Discussed financial obligations and resolved any difficulty in obtaining medication.  Per Dr Rockney Ghee, Pharm D pt can follow up with PCP and Cardiologist as ordered.

## 2013-11-28 ENCOUNTER — Telehealth: Payer: Self-pay

## 2013-11-28 DIAGNOSIS — G44209 Tension-type headache, unspecified, not intractable: Secondary | ICD-10-CM | POA: Diagnosis not present

## 2013-11-28 DIAGNOSIS — H2510 Age-related nuclear cataract, unspecified eye: Secondary | ICD-10-CM | POA: Diagnosis not present

## 2013-11-28 NOTE — Telephone Encounter (Signed)
Message copied by Lamar Laundry on Fri Nov 28, 2013 10:14 AM ------      Message from: Daneen Schick      Created: Wed Nov 26, 2013  3:27 PM       Labs are okay ------

## 2014-01-01 ENCOUNTER — Ambulatory Visit (INDEPENDENT_AMBULATORY_CARE_PROVIDER_SITE_OTHER): Payer: Medicare Other | Admitting: Pharmacist

## 2014-01-01 VITALS — Wt 213.0 lb

## 2014-01-01 DIAGNOSIS — I480 Paroxysmal atrial fibrillation: Secondary | ICD-10-CM

## 2014-01-01 DIAGNOSIS — I4891 Unspecified atrial fibrillation: Secondary | ICD-10-CM | POA: Diagnosis not present

## 2014-01-01 NOTE — Progress Notes (Signed)
Patient was previously on warfarin but was changed to Eliquis 5mg  twice a day about 6 weeks ago.  He has questions about continuing versus changing back to warfarin.  He has enjoyed that he does not have to limit green leafy vegetables since starting Eliquis and has started new diet.  He has lost about 5#. He does voice concern with Eliquis regarding GI bleeding, cost and BID dosing.   Filed Weights   01/01/14 1309  Weight: 213 lb (96.616 kg)    CHADS2VASc score is 3  Assessment - Afib with chronic anticoagulation  Plan: Patient to continue Eliquis 5mg  BID - he is to let me know if he experiences any GI discomfort I discussed pros and cons of both Eliquis and warfarin therapy.  He will let me know if he decides he want to change therapy.   Cherre Robins, PharmD, CPP

## 2014-03-13 ENCOUNTER — Encounter: Payer: Self-pay | Admitting: *Deleted

## 2014-03-28 ENCOUNTER — Ambulatory Visit (INDEPENDENT_AMBULATORY_CARE_PROVIDER_SITE_OTHER): Payer: Medicare Other

## 2014-03-28 DIAGNOSIS — Z23 Encounter for immunization: Secondary | ICD-10-CM | POA: Diagnosis not present

## 2014-04-09 ENCOUNTER — Telehealth: Payer: Self-pay | Admitting: Pharmacist

## 2014-04-09 DIAGNOSIS — Z79899 Other long term (current) drug therapy: Secondary | ICD-10-CM

## 2014-04-09 DIAGNOSIS — E785 Hyperlipidemia, unspecified: Secondary | ICD-10-CM

## 2014-04-09 NOTE — Telephone Encounter (Signed)
Patient called - will come in for labs 04/13/14.  Orders placed.  RTC 04/16/14 to review labs - appt made.

## 2014-04-13 ENCOUNTER — Other Ambulatory Visit (INDEPENDENT_AMBULATORY_CARE_PROVIDER_SITE_OTHER): Payer: Medicare Other

## 2014-04-13 DIAGNOSIS — E784 Other hyperlipidemia: Secondary | ICD-10-CM | POA: Diagnosis not present

## 2014-04-13 DIAGNOSIS — Z79899 Other long term (current) drug therapy: Secondary | ICD-10-CM | POA: Diagnosis not present

## 2014-04-13 DIAGNOSIS — E785 Hyperlipidemia, unspecified: Secondary | ICD-10-CM

## 2014-04-13 LAB — POCT CBC
GRANULOCYTE PERCENT: 73.5 % (ref 37–80)
HEMATOCRIT: 46.4 % (ref 43.5–53.7)
Hemoglobin: 15.1 g/dL (ref 14.1–18.1)
Lymph, poc: 1.9 (ref 0.6–3.4)
MCH, POC: 29 pg (ref 27–31.2)
MCHC: 32.6 g/dL (ref 31.8–35.4)
MCV: 89.1 fL (ref 80–97)
MPV: 8.5 fL (ref 0–99.8)
PLATELET COUNT, POC: 198 10*3/uL (ref 142–424)
POC GRANULOCYTE: 6.1 (ref 2–6.9)
POC LYMPH PERCENT: 23.3 %L (ref 10–50)
RBC: 5.2 M/uL (ref 4.69–6.13)
RDW, POC: 13.5 %
WBC: 8.3 10*3/uL (ref 4.6–10.2)

## 2014-04-13 NOTE — Progress Notes (Signed)
Lab only 

## 2014-04-14 LAB — CMP14+EGFR
A/G RATIO: 1.7 (ref 1.1–2.5)
ALT: 26 IU/L (ref 0–44)
AST: 27 IU/L (ref 0–40)
Albumin: 4 g/dL (ref 3.6–4.8)
Alkaline Phosphatase: 57 IU/L (ref 39–117)
BILIRUBIN TOTAL: 0.9 mg/dL (ref 0.0–1.2)
BUN/Creatinine Ratio: 21 (ref 10–22)
BUN: 22 mg/dL (ref 8–27)
CO2: 28 mmol/L (ref 18–29)
Calcium: 8.9 mg/dL (ref 8.6–10.2)
Chloride: 101 mmol/L (ref 97–108)
Creatinine, Ser: 1.07 mg/dL (ref 0.76–1.27)
GFR calc Af Amer: 83 mL/min/{1.73_m2} (ref >59)
GFR calc non Af Amer: 71 mL/min/{1.73_m2} (ref >59)
GLOBULIN, TOTAL: 2.3 g/dL (ref 1.5–4.5)
Glucose: 92 mg/dL (ref 65–99)
POTASSIUM: 4 mmol/L (ref 3.5–5.2)
SODIUM: 141 mmol/L (ref 134–144)
Total Protein: 6.3 g/dL (ref 6.0–8.5)

## 2014-04-14 LAB — NMR, LIPOPROFILE
Cholesterol: 124 mg/dL (ref 100–199)
HDL Cholesterol by NMR: 39 mg/dL — ABNORMAL LOW (ref >39)
HDL Particle Number: 30 umol/L — ABNORMAL LOW (ref >=30.5)
LDL Particle Number: 816 nmol/L (ref <1000)
LDL Size: 20.5 nm (ref >20.5)
LDL-C: 68 mg/dL (ref 0–99)
LP-IR SCORE: 53 — AB (ref <=45)
SMALL LDL PARTICLE NUMBER: 337 nmol/L (ref <=527)
Triglycerides by NMR: 85 mg/dL (ref 0–149)

## 2014-04-14 LAB — THYROID PANEL WITH TSH
Free Thyroxine Index: 2.6 (ref 1.2–4.9)
T3 Uptake Ratio: 27 % (ref 24–39)
T4, Total: 9.7 ug/dL (ref 4.5–12.0)
TSH: 3.54 u[IU]/mL (ref 0.450–4.500)

## 2014-04-16 ENCOUNTER — Encounter: Payer: Self-pay | Admitting: Pharmacist

## 2014-04-16 ENCOUNTER — Ambulatory Visit (INDEPENDENT_AMBULATORY_CARE_PROVIDER_SITE_OTHER): Payer: Medicare Other | Admitting: Pharmacist

## 2014-04-16 VITALS — BP 102/68 | HR 70 | Ht 74.0 in | Wt 212.0 lb

## 2014-04-16 DIAGNOSIS — I2510 Atherosclerotic heart disease of native coronary artery without angina pectoris: Secondary | ICD-10-CM | POA: Diagnosis not present

## 2014-04-16 DIAGNOSIS — E784 Other hyperlipidemia: Secondary | ICD-10-CM

## 2014-04-16 DIAGNOSIS — E785 Hyperlipidemia, unspecified: Secondary | ICD-10-CM

## 2014-04-16 NOTE — Progress Notes (Signed)
Lipid Clinic Consultation  Chief Complaint:   Chief Complaint  Patient presents with  . Hyperlipidemia     HPI:  Patient with dyslipidemia who is here today to review recent lab results.  He is taking atorvastatin 80mg  1/2 tablet daily and niacin 500mg  2 tablets qhs.      Component Value Date/Time   CHOL 244* 04/07/2013 0927   TRIG 314* 12/17/2012 1009    Assessment: CHD/CHF Risk Equivalents:  CAD and carotid bruit  Primary Problem(s):  LDL or LDL-P elevated and HDL or HDL-P decreased  Current NCEP Goals: LDL Goal < 70 HDL Goal >/= 40 Tg Goal < 150 Non-HDL Goal < 100   Low fat diet followed?  Yes -   Low carb diet followed?  Yes - patient over the last 4-5 months is trying to limiting sugar intake and is drinking green smoothies with kale and spinach Exercise?  Yes - but has decrease over the last 6 months  Exam Filed Vitals:   04/16/14 0827  BP: 102/68  Pulse: 70  HR regualr rate and rhythm Edema:  negative Respirations:  normal   Carotid Bruits:  positive Xanthomas:  negative General Appearance:  alert, oriented, no acute distress and well nourished Mood/Affect:  normal and NAD   Body mass index is 27.21 kg/(m^2). Filed Weights   04/16/14 0827  Weight: 212 lb (96.163 kg)    Lipo Profile (04/16/2014) LDL-P = 816 LDL-C = 68 Tg = 85 HDL = 39 Total cholesterol = 124  Thyroid panel = WNL LFT's = WNL CBC = WNL  Assessment:  Dyslipidemia - LDL and LDL-P at goals; HDL still not at goal but improving;  Tg at goal of less than 150  Recommendations: Continue atorvastatin 80mg  1/2 tablet qd and niacin 500mg  2 tablet qhs Continue with diet changes and limiting sugar intake Restart regualr exercise program - goal is 150 minutes per week. Recheck Lipid Panel:  3-6 months   Time spent counseling patient:  35 minutes   Referring Provider:  Ermalinda Memos, PharmD, CPP

## 2014-04-20 ENCOUNTER — Encounter: Payer: Self-pay | Admitting: Cardiology

## 2014-04-20 ENCOUNTER — Ambulatory Visit (INDEPENDENT_AMBULATORY_CARE_PROVIDER_SITE_OTHER): Payer: Medicare Other | Admitting: Cardiology

## 2014-04-20 VITALS — BP 108/68 | HR 60 | Ht 74.0 in | Wt 213.0 lb

## 2014-04-20 DIAGNOSIS — I480 Paroxysmal atrial fibrillation: Secondary | ICD-10-CM | POA: Diagnosis not present

## 2014-04-20 DIAGNOSIS — R0989 Other specified symptoms and signs involving the circulatory and respiratory systems: Secondary | ICD-10-CM | POA: Diagnosis not present

## 2014-04-20 DIAGNOSIS — I2510 Atherosclerotic heart disease of native coronary artery without angina pectoris: Secondary | ICD-10-CM | POA: Diagnosis not present

## 2014-04-20 DIAGNOSIS — E785 Hyperlipidemia, unspecified: Secondary | ICD-10-CM | POA: Diagnosis not present

## 2014-04-20 MED ORDER — NITROGLYCERIN 0.4 MG SL SUBL: 0.4000 mg | SUBLINGUAL_TABLET | SUBLINGUAL | Status: DC | PRN

## 2014-04-20 NOTE — Patient Instructions (Signed)
Your physician recommends that you continue on your current medications as directed. Please refer to the Current Medication list given to you today.  Your physician wants you to follow-up in: 6 months. You will receive a reminder letter in the mail two months in advance. If you don't receive a letter, please call our office to schedule the follow-up appointment.  

## 2014-04-20 NOTE — Assessment & Plan Note (Signed)
Coronary disease is stable. He is not having any significant symptoms. No further workup at this time.

## 2014-04-20 NOTE — Assessment & Plan Note (Signed)
Patient had a normal bruit in 2011. We will consider a follow-up in the future. I do not hear significant bruit today.

## 2014-04-20 NOTE — Assessment & Plan Note (Signed)
His lipids are excellent. His HDL is remaining close to 40. This is much better for him even while he has now stopped his Niaspan. No change in therapy.

## 2014-04-20 NOTE — Progress Notes (Signed)
Patient ID: Jordan [name], male   DOB: [birth year], 68 y.o.   MRN: [id number]    HPI  patient is seen today to follow-up atrial fibrillation and coronary disease. He is doing extremely well. We had switched him from Coumadin to Eliquis. He is doing very well with this. In particular he now makes green smoothies as part of his regular diet. He uses some excellent leafy green vegetables. His diet is excellent. He is also making his own energy bars with very high quality ingredients. He's not having chest pain is feeling well. His lipid studies look good.  Allergies  Allergen Reactions  . Levofloxacin Other (See Comments)    Couldn't breathe, passed out  . Lopid [Gemfibrozil] Other (See Comments)    Loose bowels  . Sulfonamide Derivatives Hives    Current Outpatient Prescriptions  Medication Sig Dispense Refill  . apixaban (ELIQUIS) 5 MG TABS tablet Take 1 tablet (5 mg total) by mouth 2 (two) times daily. 60 tablet 6  . atorvastatin (LIPITOR) 80 MG tablet TAKE (1/2) TABLET DAILY. 30 tablet 3  . Calcium Carb-Cholecalciferol (CALCIUM 600+D3 PO) Take 1 capsule by mouth daily.     . Cholecalciferol (VITAMIN D3) 1000 UNITS CAPS Take 1,000 Units by mouth daily.     Marland Kitchen diltiazem (CARDIZEM CD) 180 MG 24 hr capsule Take 1 capsule (180 mg total) by mouth daily. 30 capsule 6  . diltiazem (CARDIZEM) 60 MG tablet Take 1 tablet (60 mg total) by mouth as needed. 30 tablet 6  . fish oil-omega-3 fatty acids 1000 MG capsule Take 1 g by mouth daily.     . Glucosamine-Chondroitin 1500-1200 MG/30ML LIQD Take 1,500 mg by mouth daily.     . Multiple Vitamins-Minerals (MULTIVITAMIN WITH MINERALS) tablet Take 1 tablet by mouth daily.     . nitroGLYCERIN (NITROSTAT) 0.4 MG SL tablet Place 1 tablet (0.4 mg total) under the tongue every 5 (five) minutes as needed for chest pain. 25 tablet 3  . niacin (NIASPAN) 500 MG CR tablet TAKE 2 TABLETS DAILY AS DIRECTED     No current facility-administered medications for this  visit.    History   Social History  . Marital Status: Married    Spouse Name: N/A    Number of Children: N/A  . Years of Education: N/A   Occupational History  . 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 Smoking status: Never Smoker   . Smokeless tobacco: Never Used  . Alcohol Use: No  . Drug Use: No  . Sexual Activity: Not on file   Other Topics Concern  . Not on file   Social History Narrative    Family History  Problem Relation Age of Onset  . Heart attack Mother   . Heart attack Brother     Past Medical History  Diagnosis Date  . CAD (coronary artery disease)     Stent LAD, 2002  /  nuclear 2009, no ischemia  /    [facility] October, 2011 nuclear, no ischemia, possible slight apical scar, ejection fraction 70%  . Ejection fraction     EF 65-70%, echo, 2007 /  EF 70%, nuclear, 2011  . Atrial fibrillation     Paroxysmal  . Warfarin anticoagulation     Low CHADS , score, but patient wants to be aggressive  . Drug intolerance     Mild beta blocker intolerance  . Incomplete RBBB   . Syncope  November, 2010  . Carotid bruit     Doppler November, 2011, normal  . Knee pain     November, 201  . BPH (benign prostatic hyperplasia)   . Back pain   . Anxiety     Mild  . Dyslipidemia   . Colon polyps   . Palpitations   . IBS (irritable bowel syndrome)   . Personal history of colonic adenomas 11/29/2006    Qualifier: Diagnosis of  By: [place], [place]      Past Surgical History  Procedure Laterality Date  . Colonoscopy  multiple  . Back surgery  05/1990    [id number]  . Cardiac catheterization      Patient Active Problem List   Diagnosis Date Noted  . Carotid bruit     Priority: High  . Anxiety     Priority: High  . hyperlipidemia     Priority: High  . Palpitations     Priority: High  . High risk medication use 11/07/2013  . Chest pain 02/23/2013  . CAD (coronary artery disease)   . Ejection fraction   .  Atrial fibrillation   . Drug intolerance   . Incomplete RBBB   . Knee pain   . BENIGN PROSTATIC HYPERTROPHY, HX OF 02/06/2009  . Personal history of colonic adenomas 11/29/2006    ROS   Patient denies fever, chills, headache, sweats, rash, change in vision, change in hearing, chest pain, cough, nausea or vomiting, urinary symptoms. All other systems are reviewed and are negative.  PHYSICAL EXAM  patient is oriented to person, place. Affect is normal. Head is atraumatic. Sclerae and conjunctiva are normal. There is no jugulovenous distention. Lungs are clear. Respiratory effort is nonlabored. Cardiac exam reveals an S1 and S2. Abdomen is soft. There is no peripheral edema. There are no musculoskeletal deformities. There are no skin rashes.  Filed Vitals:   04/20/14 1009  BP: 108/68  Pulse: 60  Height: 6\' 2"  (1.88 m)  Weight: 213 lb (96.616 kg)  SpO2: 97%     ASSESSMENT & PLAN

## 2014-04-20 NOTE — Assessment & Plan Note (Signed)
He has a history of paroxysmal atrial fibrillation. His rate is stable and regular today. He is anticoagulated.

## 2014-05-11 ENCOUNTER — Ambulatory Visit: Payer: Medicare Other | Admitting: Family Medicine

## 2014-05-12 ENCOUNTER — Ambulatory Visit (INDEPENDENT_AMBULATORY_CARE_PROVIDER_SITE_OTHER): Payer: Medicare Other | Admitting: Family Medicine

## 2014-05-12 ENCOUNTER — Encounter: Payer: Self-pay | Admitting: Family Medicine

## 2014-05-12 VITALS — BP 103/70 | HR 61 | Temp 97.7°F | Ht 74.0 in | Wt 211.0 lb

## 2014-05-12 DIAGNOSIS — E785 Hyperlipidemia, unspecified: Secondary | ICD-10-CM

## 2014-05-12 DIAGNOSIS — Z79899 Other long term (current) drug therapy: Secondary | ICD-10-CM | POA: Diagnosis not present

## 2014-05-12 DIAGNOSIS — E784 Other hyperlipidemia: Secondary | ICD-10-CM

## 2014-05-12 DIAGNOSIS — M25561 Pain in right knee: Secondary | ICD-10-CM

## 2014-05-12 DIAGNOSIS — I2510 Atherosclerotic heart disease of native coronary artery without angina pectoris: Secondary | ICD-10-CM

## 2014-05-12 DIAGNOSIS — I480 Paroxysmal atrial fibrillation: Secondary | ICD-10-CM | POA: Diagnosis not present

## 2014-05-12 NOTE — Patient Instructions (Addendum)
Medicare Annual Wellness Visit  [facility] and the medical providers at [facility] strive to bring you the best medical care.  In doing so we not only want to address your current medical conditions and concerns but also to detect new conditions early and prevent illness, disease and health-related problems.    Medicare offers a yearly Wellness Visit which allows our clinical staff to assess your need for preventative services including immunizations, lifestyle education, counseling to decrease risk of preventable diseases and screening for fall risk and other medical concerns.    This visit is provided free of charge (no copay) for all Medicare recipients. The clinical pharmacists at [facility] have begun to conduct these Wellness Visits which will also include a thorough review of all your medications.    As you primary medical provider recommend that you make an appointment for your Annual Wellness Visit if you have not done so already this year.  You may set up this appointment before you leave today or you may call back [phone]) and schedule an appointment.  Please make sure when you call that you mention that you are scheduling your Annual Wellness Visit with the clinical pharmacist so that the appointment may be made for the proper length of time.     Continue current medications. Continue good therapeutic lifestyle changes which include good diet and exercise. Fall precautions discussed with patient. If an FOBT was given today- please return it to our front desk. If you are over 52 years old - you may need Prevnar 47 or the adult Pneumonia vaccine.  Flu Shots will be available at our office starting mid- September. Please call and schedule a FLU CLINIC APPOINTMENT.   Continue current medications continue follow-up with cardiology and urology and periodic follow-ups with gastroenterology If the pain gets worse, return  to clinic for an x-ray, otherwise take Tylenol and maintained good range of motion and walking activity

## 2014-05-12 NOTE — Progress Notes (Signed)
Subjective:    Patient ID: Jordan [name], male    DOB: Sep 30, 1945, 68 y.o.   MRN: [id number]  HPI Pt here for follow up and management of chronic medical problems. The patient sees Dr. Roni Bread yearly. He also sees a cardiologist, Dr. Ron Parker regularly. He is having some complaints with his knee but he feels like it is not bad enough to have it x-rayed. His recent lab work will be reviewed with him and he will be given a copy of this.          Patient Active Problem List   Diagnosis Date Noted  . High risk medication use 11/07/2013  . Chest pain 02/23/2013  . CAD (coronary artery disease)   . Ejection fraction   . Atrial fibrillation   . Drug intolerance   . Incomplete RBBB   . Carotid bruit   . Knee pain   . Anxiety   . hyperlipidemia   . Palpitations   . BENIGN PROSTATIC HYPERTROPHY, HX OF 02/06/2009  . Personal history of colonic adenomas 11/29/2006   Outpatient Encounter Prescriptions as of 05/12/2014  Medication Sig  . apixaban (ELIQUIS) 5 MG TABS tablet Take 1 tablet (5 mg total) by mouth 2 (two) times daily.  Marland Kitchen atorvastatin (LIPITOR) 80 MG tablet TAKE (1/2) TABLET DAILY.  . Calcium Carb-Cholecalciferol (CALCIUM 600+D3 PO) Take 1 capsule by mouth daily.   . Cholecalciferol (VITAMIN D3) 1000 UNITS CAPS Take 1,000 Units by mouth daily.   Marland Kitchen diltiazem (CARDIZEM CD) 180 MG 24 hr capsule Take 1 capsule (180 mg total) by mouth daily.  Marland Kitchen diltiazem (CARDIZEM) 60 MG tablet Take 1 tablet (60 mg total) by mouth as needed.  . fish oil-omega-3 fatty acids 1000 MG capsule Take 1 g by mouth daily.   . Glucosamine-Chondroitin 1500-1200 MG/30ML LIQD Take 1,500 mg by mouth daily.   . Multiple Vitamins-Minerals (MULTIVITAMIN WITH MINERALS) tablet Take 1 tablet by mouth daily.   . niacin (NIASPAN) 500 MG CR tablet TAKE 2 TABLETS DAILY AS DIRECTED  . nitroGLYCERIN (NITROSTAT) 0.4 MG SL tablet Place 1 tablet (0.4 mg total) under the tongue every 5 (five) minutes as needed for chest pain.     Review of Systems  Constitutional: Negative.   HENT: Negative.   Eyes: Negative.   Respiratory: Negative.   Cardiovascular: Negative.   Gastrointestinal: Negative.   Endocrine: Negative.   Genitourinary: Negative.   Musculoskeletal: Positive for arthralgias (right knee pain at times - per pt - "not bad enough for xray").  Skin: Negative.   Allergic/Immunologic: Negative.   Neurological: Negative.   Hematological: Negative.   Psychiatric/Behavioral: Negative.        Objective:   Physical Exam  Constitutional: He is oriented to person, place, and time. He appears well-developed and well-nourished. No distress.  HENT:  Head: Normocephalic and atraumatic.  Right Ear: External ear normal.  Left Ear: External ear normal.  Mouth/Throat: Oropharynx is clear and moist. No oropharyngeal exudate.  Nasal congestion left greater than right  Eyes: Conjunctivae and EOM are normal. Pupils are equal, round, and reactive to light. Right eye exhibits no discharge. Left eye exhibits no discharge. No scleral icterus.  Neck: Normal range of motion. Neck supple. No tracheal deviation present. No thyromegaly present.  No carotid bruits or thyromegaly  Cardiovascular: Normal rate, regular rhythm, normal heart sounds and intact distal pulses.  Exam reveals no gallop and no friction rub.   No murmur heard. The heart has a regular rate and rhythm  today at 60/m  Pulmonary/Chest: Effort normal and breath sounds normal. No respiratory distress. He has no wheezes. He has no rales. He exhibits no tenderness.  There is no axillary adenopathy  Abdominal: Soft. Bowel sounds are normal. He exhibits no mass. There is no tenderness. There is no rebound and no guarding.  There is no abdominal bruit or inguinal adenopathy  Musculoskeletal: Normal range of motion. He exhibits edema. He exhibits no tenderness.  There is slight bilateral ankle edema  Lymphadenopathy:    He has no cervical adenopathy.   Neurological: He is alert and oriented to person, place, and time. He has normal reflexes.  Skin: Skin is warm and dry. No rash noted. No erythema. No pallor.  Psychiatric: He has a normal mood and affect. His behavior is normal. Judgment and thought content normal.  Nursing note and vitals reviewed.  BP 103/70 mmHg  Pulse 61  Temp(Src) 97.7 F (36.5 C) (Oral)  Ht 6\' 2"  (1.88 m)  Wt 211 lb (95.709 kg)  BMI 27.08 kg/m2        Assessment & Plan:  1. Paroxysmal atrial fibrillation  2. Dyslipidemia (high LDL; low HDL)  3. High risk medication use  4. Right knee pain -Continue Tylenol as needed for pain and return to clinic for x-ray if needed  Patient Instructions                       Medicare Annual Wellness Visit  [facility] and the medical providers at [facility] strive to bring you the best medical care.  In doing so we not only want to address your current medical conditions and concerns but also to detect new conditions early and prevent illness, disease and health-related problems.    Medicare offers a yearly Wellness Visit which allows our clinical staff to assess your need for preventative services including immunizations, lifestyle education, counseling to decrease risk of preventable diseases and screening for fall risk and other medical concerns.    This visit is provided free of charge (no copay) for all Medicare recipients. The clinical pharmacists at [facility] have begun to conduct these Wellness Visits which will also include a thorough review of all your medications.    As you primary medical provider recommend that you make an appointment for your Annual Wellness Visit if you have not done so already this year.  You may set up this appointment before you leave today or you may call back [phone]) and schedule an appointment.  Please make sure when you call that you mention that you are scheduling your Annual  Wellness Visit with the clinical pharmacist so that the appointment may be made for the proper length of time.     Continue current medications. Continue good therapeutic lifestyle changes which include good diet and exercise. Fall precautions discussed with patient. If an FOBT was given today- please return it to our front desk. If you are over 23 years old - you may need Prevnar 28 or the adult Pneumonia vaccine.  Flu Shots will be available at our office starting mid- September. Please call and schedule a FLU CLINIC APPOINTMENT.   Continue current medications continue follow-up with cardiology and urology and periodic follow-ups with gastroenterology If the pain gets worse, return to clinic for an x-ray, otherwise take Tylenol and maintained good range of motion and walking activity    Arrie Senate MD

## 2014-05-18 DIAGNOSIS — M9902 Segmental and somatic dysfunction of thoracic region: Secondary | ICD-10-CM | POA: Diagnosis not present

## 2014-05-18 DIAGNOSIS — M47812 Spondylosis without myelopathy or radiculopathy, cervical region: Secondary | ICD-10-CM | POA: Diagnosis not present

## 2014-05-18 DIAGNOSIS — M9903 Segmental and somatic dysfunction of lumbar region: Secondary | ICD-10-CM | POA: Diagnosis not present

## 2014-05-18 DIAGNOSIS — M47816 Spondylosis without myelopathy or radiculopathy, lumbar region: Secondary | ICD-10-CM | POA: Diagnosis not present

## 2014-05-18 DIAGNOSIS — M9901 Segmental and somatic dysfunction of cervical region: Secondary | ICD-10-CM | POA: Diagnosis not present

## 2014-05-18 DIAGNOSIS — M4004 Postural kyphosis, thoracic region: Secondary | ICD-10-CM | POA: Diagnosis not present

## 2014-05-20 ENCOUNTER — Other Ambulatory Visit: Payer: Self-pay | Admitting: Family Medicine

## 2014-05-21 DIAGNOSIS — M4004 Postural kyphosis, thoracic region: Secondary | ICD-10-CM | POA: Diagnosis not present

## 2014-05-21 DIAGNOSIS — M47812 Spondylosis without myelopathy or radiculopathy, cervical region: Secondary | ICD-10-CM | POA: Diagnosis not present

## 2014-05-21 DIAGNOSIS — M47816 Spondylosis without myelopathy or radiculopathy, lumbar region: Secondary | ICD-10-CM | POA: Diagnosis not present

## 2014-05-21 DIAGNOSIS — M9901 Segmental and somatic dysfunction of cervical region: Secondary | ICD-10-CM | POA: Diagnosis not present

## 2014-05-21 DIAGNOSIS — M9903 Segmental and somatic dysfunction of lumbar region: Secondary | ICD-10-CM | POA: Diagnosis not present

## 2014-05-21 DIAGNOSIS — M9902 Segmental and somatic dysfunction of thoracic region: Secondary | ICD-10-CM | POA: Diagnosis not present

## 2014-05-28 DIAGNOSIS — M9903 Segmental and somatic dysfunction of lumbar region: Secondary | ICD-10-CM | POA: Diagnosis not present

## 2014-05-28 DIAGNOSIS — M4004 Postural kyphosis, thoracic region: Secondary | ICD-10-CM | POA: Diagnosis not present

## 2014-05-28 DIAGNOSIS — M47812 Spondylosis without myelopathy or radiculopathy, cervical region: Secondary | ICD-10-CM | POA: Diagnosis not present

## 2014-05-28 DIAGNOSIS — M9902 Segmental and somatic dysfunction of thoracic region: Secondary | ICD-10-CM | POA: Diagnosis not present

## 2014-05-28 DIAGNOSIS — M47816 Spondylosis without myelopathy or radiculopathy, lumbar region: Secondary | ICD-10-CM | POA: Diagnosis not present

## 2014-05-28 DIAGNOSIS — M9901 Segmental and somatic dysfunction of cervical region: Secondary | ICD-10-CM | POA: Diagnosis not present

## 2014-06-16 DIAGNOSIS — D1801 Hemangioma of skin and subcutaneous tissue: Secondary | ICD-10-CM | POA: Diagnosis not present

## 2014-06-16 DIAGNOSIS — L570 Actinic keratosis: Secondary | ICD-10-CM | POA: Diagnosis not present

## 2014-06-16 DIAGNOSIS — D2372 Other benign neoplasm of skin of left lower limb, including hip: Secondary | ICD-10-CM | POA: Diagnosis not present

## 2014-06-16 DIAGNOSIS — L817 Pigmented purpuric dermatosis: Secondary | ICD-10-CM | POA: Diagnosis not present

## 2014-06-16 DIAGNOSIS — L812 Freckles: Secondary | ICD-10-CM | POA: Diagnosis not present

## 2014-06-16 DIAGNOSIS — L821 Other seborrheic keratosis: Secondary | ICD-10-CM | POA: Diagnosis not present

## 2014-06-16 DIAGNOSIS — Z85828 Personal history of other malignant neoplasm of skin: Secondary | ICD-10-CM | POA: Diagnosis not present

## 2014-06-16 DIAGNOSIS — D225 Melanocytic nevi of trunk: Secondary | ICD-10-CM | POA: Diagnosis not present

## 2014-06-23 ENCOUNTER — Other Ambulatory Visit: Payer: Self-pay | Admitting: Cardiology

## 2014-08-13 DIAGNOSIS — M9903 Segmental and somatic dysfunction of lumbar region: Secondary | ICD-10-CM | POA: Diagnosis not present

## 2014-08-13 DIAGNOSIS — M546 Pain in thoracic spine: Secondary | ICD-10-CM | POA: Diagnosis not present

## 2014-08-13 DIAGNOSIS — M9902 Segmental and somatic dysfunction of thoracic region: Secondary | ICD-10-CM | POA: Diagnosis not present

## 2014-08-13 DIAGNOSIS — M9901 Segmental and somatic dysfunction of cervical region: Secondary | ICD-10-CM | POA: Diagnosis not present

## 2014-08-13 DIAGNOSIS — M47812 Spondylosis without myelopathy or radiculopathy, cervical region: Secondary | ICD-10-CM | POA: Diagnosis not present

## 2014-08-13 DIAGNOSIS — M47816 Spondylosis without myelopathy or radiculopathy, lumbar region: Secondary | ICD-10-CM | POA: Diagnosis not present

## 2014-08-26 DIAGNOSIS — M9902 Segmental and somatic dysfunction of thoracic region: Secondary | ICD-10-CM | POA: Diagnosis not present

## 2014-08-26 DIAGNOSIS — M9903 Segmental and somatic dysfunction of lumbar region: Secondary | ICD-10-CM | POA: Diagnosis not present

## 2014-08-26 DIAGNOSIS — M47816 Spondylosis without myelopathy or radiculopathy, lumbar region: Secondary | ICD-10-CM | POA: Diagnosis not present

## 2014-08-26 DIAGNOSIS — M47812 Spondylosis without myelopathy or radiculopathy, cervical region: Secondary | ICD-10-CM | POA: Diagnosis not present

## 2014-08-26 DIAGNOSIS — M9901 Segmental and somatic dysfunction of cervical region: Secondary | ICD-10-CM | POA: Diagnosis not present

## 2014-08-26 DIAGNOSIS — M546 Pain in thoracic spine: Secondary | ICD-10-CM | POA: Diagnosis not present

## 2014-08-28 DIAGNOSIS — M9903 Segmental and somatic dysfunction of lumbar region: Secondary | ICD-10-CM | POA: Diagnosis not present

## 2014-08-28 DIAGNOSIS — M546 Pain in thoracic spine: Secondary | ICD-10-CM | POA: Diagnosis not present

## 2014-08-28 DIAGNOSIS — M9902 Segmental and somatic dysfunction of thoracic region: Secondary | ICD-10-CM | POA: Diagnosis not present

## 2014-08-28 DIAGNOSIS — M47816 Spondylosis without myelopathy or radiculopathy, lumbar region: Secondary | ICD-10-CM | POA: Diagnosis not present

## 2014-08-28 DIAGNOSIS — M47812 Spondylosis without myelopathy or radiculopathy, cervical region: Secondary | ICD-10-CM | POA: Diagnosis not present

## 2014-08-28 DIAGNOSIS — M9901 Segmental and somatic dysfunction of cervical region: Secondary | ICD-10-CM | POA: Diagnosis not present

## 2014-10-16 ENCOUNTER — Other Ambulatory Visit: Payer: Self-pay | Admitting: Cardiology

## 2014-10-21 ENCOUNTER — Encounter: Payer: Self-pay | Admitting: Internal Medicine

## 2014-10-21 ENCOUNTER — Ambulatory Visit: Payer: Self-pay | Admitting: Cardiology

## 2014-10-26 ENCOUNTER — Encounter: Payer: Self-pay | Admitting: Cardiology

## 2014-10-26 ENCOUNTER — Ambulatory Visit (INDEPENDENT_AMBULATORY_CARE_PROVIDER_SITE_OTHER): Payer: Medicare Other | Admitting: Cardiology

## 2014-10-26 VITALS — BP 118/60 | HR 58 | Ht 74.0 in | Wt 218.0 lb

## 2014-10-26 DIAGNOSIS — R0989 Other specified symptoms and signs involving the circulatory and respiratory systems: Secondary | ICD-10-CM | POA: Diagnosis not present

## 2014-10-26 DIAGNOSIS — E785 Hyperlipidemia, unspecified: Secondary | ICD-10-CM

## 2014-10-26 DIAGNOSIS — I2510 Atherosclerotic heart disease of native coronary artery without angina pectoris: Secondary | ICD-10-CM

## 2014-10-26 DIAGNOSIS — I480 Paroxysmal atrial fibrillation: Secondary | ICD-10-CM

## 2014-10-26 MED ORDER — NITROGLYCERIN 0.4 MG SL SUBL: 0.4000 mg | SUBLINGUAL_TABLET | SUBLINGUAL | Status: DC | PRN

## 2014-10-26 MED ORDER — DILTIAZEM HCL 60 MG PO TABS: 60.0000 mg | ORAL_TABLET | ORAL | Status: DC | PRN

## 2014-10-26 MED ORDER — APIXABAN 5 MG PO TABS: 5.0000 mg | ORAL_TABLET | Freq: Two times a day (BID) | ORAL | Status: DC

## 2014-10-26 MED ORDER — DILTIAZEM HCL ER COATED BEADS 180 MG PO CP24: 180.0000 mg | ORAL_CAPSULE | Freq: Every day | ORAL | Status: DC

## 2014-10-26 NOTE — Progress Notes (Signed)
Cardiology Office Note   Date:  10/26/2014   ID:  Jordan [name], DOB 1946-04-28, MRN [id number]  PCP:  Redge Gainer, MD  Cardiologist:  Dola Argyle, MD   Chief Complaint  Patient presents with  . Appointment    Follow-up coronary artery disease      History of Present Illness: Jordan [name] is a 69 y.o. male who presents to follow-up paroxysmal atrial fibrillation and coronary artery disease. He is doing very well. He is very active. He continues to slow ski and ride a bicycle. He is active with 7 grandchildren. He also continues to work in the [employer]. He is not having any significant chest pain or shortness of breath.  The patient is aware that I am retiring at the end of September, 2016. His follow-up will be with Dr. Marlou Porch.    Past Medical History  Diagnosis Date  . CAD (coronary artery disease)     Stent LAD, 2002  /  nuclear 2009, no ischemia  /    [facility] October, 2011 nuclear, no ischemia, possible slight apical scar, ejection fraction 70%  . Ejection fraction     EF 65-70%, echo, 2007 /  EF 70%, nuclear, 2011  . Atrial fibrillation     Paroxysmal  . Warfarin anticoagulation     Low CHADS , score, but patient wants to be aggressive  . Drug intolerance     Mild beta blocker intolerance  . Incomplete RBBB   . Syncope     November, 2010  . Carotid bruit     Doppler November, 2011, normal  . Knee pain     November, 201  . BPH (benign prostatic hyperplasia)   . Back pain   . Anxiety     Mild  . Dyslipidemia   . Colon polyps   . Palpitations   . IBS (irritable bowel syndrome)   . Personal history of colonic adenomas 11/29/2006    Qualifier: Diagnosis of  By: [place], [place]      Past Surgical History  Procedure Laterality Date  . Colonoscopy  multiple  . Back surgery  05/1990    [id number]  . Cardiac catheterization      Patient Active Problem List   Diagnosis Date Noted  . Carotid bruit     Priority: High  . Anxiety     Priority:  High  . hyperlipidemia     Priority: High  . Palpitations     Priority: High  . High risk medication use 11/07/2013  . Chest pain 02/23/2013  . CAD (coronary artery disease)   . Ejection fraction   . Atrial fibrillation   . Drug intolerance   . Incomplete RBBB   . Knee pain   . BENIGN PROSTATIC HYPERTROPHY, HX OF 02/06/2009  . Personal history of colonic adenomas 11/29/2006      Current Outpatient Prescriptions  Medication Sig Dispense Refill  . apixaban (ELIQUIS) 5 MG TABS tablet Take 1 tablet (5 mg total) by mouth 2 (two) times daily. 60 tablet 6  . atorvastatin (LIPITOR) 80 MG tablet TAKE (1/2) TABLET DAILY. 30 tablet 2  . Calcium Carb-Cholecalciferol (CALCIUM 600+D3 PO) Take 1 capsule by mouth daily.     . Cholecalciferol (VITAMIN D3) 1000 UNITS CAPS Take 1,000 Units by mouth daily.     Marland Kitchen diltiazem (CARDIZEM CD) 180 MG 24 hr capsule Take 1 capsule (180 mg total) by mouth daily. 30 capsule 6  . diltiazem (CARDIZEM) 60 MG tablet Take  1 tablet (60 mg total) by mouth as needed. 30 tablet 6  . fish oil-omega-3 fatty acids 1000 MG capsule Take 1 g by mouth daily.     . Glucosamine-Chondroitin 1500-1200 MG/30ML LIQD Take 1,500 mg by mouth daily.     . Multiple Vitamins-Minerals (MULTIVITAMIN WITH MINERALS) tablet Take 1 tablet by mouth daily.     . nitroGLYCERIN (NITROSTAT) 0.4 MG SL tablet Place 1 tablet (0.4 mg total) under the tongue every 5 (five) minutes as needed for chest pain. 25 tablet 3   No current facility-administered medications for this visit.    Allergies:   Levofloxacin; Lopid; and Sulfonamide derivatives    Social History:  The patient  reports that he has never smoked. He has never used smokeless tobacco. He reports that he does not drink alcohol or use illicit drugs.   Family History:  The patient's family history includes Heart attack in his brother and mother.    ROS:  Please see the history of present illness.   Patient denies fever, chills, headache,  sweats, rash, change in vision, change in hearing, chest pain, cough, nausea or vomiting, urinary symptoms. All other systems are reviewed and are negative.     PHYSICAL EXAM: VS:  BP 118/60 mmHg  Pulse 58  Ht 6\' 2"  (1.88 m)  Wt 218 lb (98.884 kg)  BMI 27.98 kg/m2 , Patient is oriented to person time and place. Affect is normal. Head is atraumatic. Sclera and conjunctiva are normal. There is no jugular venous distention. Lungs are clear. Respiratory effort is not labored. Cardiac exam reveals S1 and S2. Abdomen is soft. There is no peripheral edema. There are no musculoskeletal deformities. There are no skin rashes. Neurologic is grossly intact.  EKG:   EKG is done today and reviewed by me. There is mild sinus bradycardia and mild nonspecific ST-T wave changes. There is no significant change from the past.  Recent Labs: 11/24/2013: Platelets 207.0 04/13/2014: ALT 26; BUN 22; Creatinine 1.07; Hemoglobin 15.1; Potassium 4.0; Sodium 141; TSH 3.540    Lipid Panel    Component Value Date/Time   CHOL 124 04/13/2014 0812   CHOL 125 03/06/2013 0821   TRIG 85 04/13/2014 0812   TRIG 73 03/06/2013 0821   TRIG 81 02/24/2013 0900   HDL 39* 04/13/2014 0812   HDL 40 03/06/2013 0821   HDL 40 02/24/2013 0900   CHOLHDL 2.8 02/24/2013 0900   VLDL 16 02/24/2013 0900   LDLCALC 68 11/18/2013 0804   LDLCALC 70 03/06/2013 0821   LDLCALC 55 02/24/2013 0900      Wt Readings from Last 3 Encounters:  10/26/14 218 lb (98.884 kg)  05/12/14 211 lb (95.709 kg)  04/20/14 213 lb (96.616 kg)      Current medicines are reviewed  The patient understands his medications well.   ASSESSMENT AND PLAN:

## 2014-10-26 NOTE — Patient Instructions (Signed)
**Note De-Identified Kristina Bertone Obfuscation** Medication Instructions:  Same  Labwork: None  Testing/Procedures: None  Follow-Up: Your physician wants you to follow-up in: 6 months with Dr Marlou Porch. You will receive a reminder letter in the mail two months in advance. If you don't receive a letter, please call our office to schedule the follow-up appointment.

## 2014-10-26 NOTE — Assessment & Plan Note (Signed)
His lipids are very well treated. In the past he had been on niacin in addition. He denied discussed this and it was stopped. His LDL is in an excellent range. No change in therapy.

## 2014-10-26 NOTE — Assessment & Plan Note (Signed)
The patient had a carotid Doppler in November, 2011. It was normal. On this basis I have not repeated this study. At some point we might want to consider repeat.

## 2014-10-26 NOTE — Assessment & Plan Note (Signed)
He has had limited paroxysmal atrial fibrillation over the years. He has been very carefully anticoagulated. In the past year we changed him from Coumadin to Eliquis. He is doing very well with this. He is on diltiazem. He is not a car to an antiarrhythmic.

## 2014-10-26 NOTE — Assessment & Plan Note (Signed)
Patient had a stent to the LAD in 2002. He was hospitalized in October, 2011. Nuclear study showed no ischemia. EF was 70%. He's been stable. Aspirin was stopped as he is on Eliquis. No further workup

## 2014-11-04 DIAGNOSIS — N401 Enlarged prostate with lower urinary tract symptoms: Secondary | ICD-10-CM | POA: Diagnosis not present

## 2014-11-11 DIAGNOSIS — N5201 Erectile dysfunction due to arterial insufficiency: Secondary | ICD-10-CM | POA: Diagnosis not present

## 2014-11-11 DIAGNOSIS — N138 Other obstructive and reflux uropathy: Secondary | ICD-10-CM | POA: Diagnosis not present

## 2014-11-11 DIAGNOSIS — R972 Elevated prostate specific antigen [PSA]: Secondary | ICD-10-CM | POA: Diagnosis not present

## 2014-11-11 DIAGNOSIS — N3941 Urge incontinence: Secondary | ICD-10-CM | POA: Diagnosis not present

## 2014-11-11 DIAGNOSIS — N401 Enlarged prostate with lower urinary tract symptoms: Secondary | ICD-10-CM | POA: Diagnosis not present

## 2014-11-13 ENCOUNTER — Other Ambulatory Visit (INDEPENDENT_AMBULATORY_CARE_PROVIDER_SITE_OTHER): Payer: Medicare Other

## 2014-11-13 DIAGNOSIS — I480 Paroxysmal atrial fibrillation: Secondary | ICD-10-CM

## 2014-11-13 DIAGNOSIS — E559 Vitamin D deficiency, unspecified: Secondary | ICD-10-CM

## 2014-11-13 DIAGNOSIS — Z79899 Other long term (current) drug therapy: Secondary | ICD-10-CM

## 2014-11-13 DIAGNOSIS — E784 Other hyperlipidemia: Secondary | ICD-10-CM | POA: Diagnosis not present

## 2014-11-13 DIAGNOSIS — E785 Hyperlipidemia, unspecified: Secondary | ICD-10-CM

## 2014-11-13 LAB — POCT CBC
Granulocyte percent: 70.5 %G (ref 37–80)
HCT, POC: 48.9 % (ref 43.5–53.7)
HEMOGLOBIN: 15.1 g/dL (ref 14.1–18.1)
LYMPH, POC: 1.9 (ref 0.6–3.4)
MCH, POC: 27.9 pg (ref 27–31.2)
MCHC: 30.8 g/dL — AB (ref 31.8–35.4)
MCV: 90.5 fL (ref 80–97)
MPV: 9.3 fL (ref 0–99.8)
PLATELET COUNT, POC: 225 10*3/uL (ref 142–424)
POC GRANULOCYTE: 5.5 (ref 2–6.9)
POC LYMPH PERCENT: 24.2 %L (ref 10–50)
RBC: 5.4 M/uL (ref 4.69–6.13)
RDW, POC: 13.3 %
WBC: 7.8 10*3/uL (ref 4.6–10.2)

## 2014-11-13 NOTE — Progress Notes (Signed)
Lab only 

## 2014-11-14 LAB — NMR, LIPOPROFILE
Cholesterol: 125 mg/dL (ref 100–199)
HDL CHOLESTEROL BY NMR: 39 mg/dL — AB (ref >39)
HDL Particle Number: 28.1 umol/L — ABNORMAL LOW (ref >=30.5)
LDL Particle Number: 838 nmol/L (ref <1000)
LDL Size: 20.7 nm (ref >20.5)
LDL-C: 72 mg/dL (ref 0–99)
LP-IR Score: 42 (ref <=45)
Small LDL Particle Number: 319 nmol/L (ref <=527)
TRIGLYCERIDES BY NMR: 69 mg/dL (ref 0–149)

## 2014-11-14 LAB — HEPATIC FUNCTION PANEL
ALT: 29 IU/L (ref 0–44)
AST: 29 IU/L (ref 0–40)
Albumin: 4 g/dL (ref 3.6–4.8)
Alkaline Phosphatase: 52 IU/L (ref 39–117)
BILIRUBIN, DIRECT: 0.19 mg/dL (ref 0.00–0.40)
Bilirubin Total: 0.7 mg/dL (ref 0.0–1.2)
TOTAL PROTEIN: 6.3 g/dL (ref 6.0–8.5)

## 2014-11-14 LAB — BMP8+EGFR
BUN / CREAT RATIO: 22 (ref 10–22)
BUN: 24 mg/dL (ref 8–27)
CHLORIDE: 101 mmol/L (ref 97–108)
CO2: 26 mmol/L (ref 18–29)
CREATININE: 1.08 mg/dL (ref 0.76–1.27)
Calcium: 8.8 mg/dL (ref 8.6–10.2)
GFR calc Af Amer: 82 mL/min/{1.73_m2} (ref >59)
GFR, EST NON AFRICAN AMERICAN: 71 mL/min/{1.73_m2} (ref >59)
Glucose: 91 mg/dL (ref 65–99)
POTASSIUM: 4.3 mmol/L (ref 3.5–5.2)
SODIUM: 138 mmol/L (ref 134–144)

## 2014-11-14 LAB — VITAMIN D 25 HYDROXY (VIT D DEFICIENCY, FRACTURES): Vit D, 25-Hydroxy: 54.9 ng/mL (ref 30.0–100.0)

## 2014-11-18 ENCOUNTER — Ambulatory Visit (INDEPENDENT_AMBULATORY_CARE_PROVIDER_SITE_OTHER): Payer: Medicare Other | Admitting: Family Medicine

## 2014-11-18 ENCOUNTER — Encounter: Payer: Self-pay | Admitting: Family Medicine

## 2014-11-18 VITALS — BP 107/73 | HR 58 | Temp 97.3°F | Ht 74.0 in | Wt 215.0 lb

## 2014-11-18 DIAGNOSIS — I480 Paroxysmal atrial fibrillation: Secondary | ICD-10-CM | POA: Diagnosis not present

## 2014-11-18 DIAGNOSIS — E785 Hyperlipidemia, unspecified: Secondary | ICD-10-CM | POA: Diagnosis not present

## 2014-11-18 DIAGNOSIS — R002 Palpitations: Secondary | ICD-10-CM

## 2014-11-18 DIAGNOSIS — Z79899 Other long term (current) drug therapy: Secondary | ICD-10-CM

## 2014-11-18 DIAGNOSIS — Z1211 Encounter for screening for malignant neoplasm of colon: Secondary | ICD-10-CM | POA: Diagnosis not present

## 2014-11-18 DIAGNOSIS — N400 Enlarged prostate without lower urinary tract symptoms: Secondary | ICD-10-CM

## 2014-11-18 DIAGNOSIS — I2510 Atherosclerotic heart disease of native coronary artery without angina pectoris: Secondary | ICD-10-CM | POA: Diagnosis not present

## 2014-11-18 NOTE — Progress Notes (Signed)
Subjective:    Patient ID: Jordan [name], male    DOB: 23-Apr-1946, 69 y.o.   MRN: [id number]  HPI   The patient comes in today for routine follow-up of chronic medical problems which include atrial fibrillation, elevated PSA and carotid bruit with hyperlipidemia. He is recently seen the urologist and had his rectal exam done. The urologist is requesting another PSA in about 6 months in a future order will be put in for this. The patient has had recent blood work done and this will be reviewed with him during the visit today. His renal function was good with a good creatinine of the normal blood sugar with good electrolytes. CBC was within normal limits with a good hemoglobin at 15.1 and an adequate platelet count. Although his liver function tests were normal. Advanced lipid testing the total LDL particle number and LDL C were good and at goal and stable and consistent with past readings. The HDL particle number or the good cholesterol remains slightly decreased and this is also consistent with past readings. His vitamin D was good and he should continue with his current vitamin D treatment. The patient denies chest pain shortness of breath problems with his GI tract or problems with voiding.  Patient Active Problem List   Diagnosis Date Noted  . High risk medication use 11/07/2013  . Chest pain 02/23/2013  . CAD (coronary artery disease)   . Ejection fraction   . Atrial fibrillation   . Drug intolerance   . Incomplete RBBB   . Carotid bruit   . Knee pain   . Anxiety   . hyperlipidemia   . Palpitations   . BPH (benign prostatic hypertrophy) 02/06/2009  . Personal history of colonic adenomas 11/29/2006   Outpatient Encounter Prescriptions as of 11/18/2014  Medication Sig  . apixaban (ELIQUIS) 5 MG TABS tablet Take 1 tablet (5 mg total) by mouth 2 (two) times daily.  Marland Kitchen atorvastatin (LIPITOR) 80 MG tablet TAKE (1/2) TABLET DAILY.  . Calcium Carb-Cholecalciferol (CALCIUM 600+D3 PO) Take 1  capsule by mouth daily.   . Cholecalciferol (VITAMIN D3) 1000 UNITS CAPS Take 1,000 Units by mouth daily.   Marland Kitchen diltiazem (CARDIZEM CD) 180 MG 24 hr capsule Take 1 capsule (180 mg total) by mouth daily.  Marland Kitchen diltiazem (CARDIZEM) 60 MG tablet Take 1 tablet (60 mg total) by mouth as needed.  . fish oil-omega-3 fatty acids 1000 MG capsule Take 1 g by mouth daily.   . Glucosamine-Chondroitin 1500-1200 MG/30ML LIQD Take 1,500 mg by mouth daily.   . Multiple Vitamins-Minerals (MULTIVITAMIN WITH MINERALS) tablet Take 1 tablet by mouth daily.   . nitroGLYCERIN (NITROSTAT) 0.4 MG SL tablet Place 1 tablet (0.4 mg total) under the tongue every 5 (five) minutes as needed for chest pain.   No facility-administered encounter medications on file as of 11/18/2014.      Review of Systems  Constitutional: Negative.   HENT: Negative.   Eyes: Negative.   Respiratory: Negative.   Cardiovascular: Negative.   Gastrointestinal: Negative.   Endocrine: Negative.   Genitourinary: Negative.   Musculoskeletal: Negative.   Skin: Negative.   Allergic/Immunologic: Negative.   Neurological: Negative.   Hematological: Negative.   Psychiatric/Behavioral: Negative.        Objective:   Physical Exam  Constitutional: He is oriented to person, place, and time. He appears well-developed and well-nourished. No distress.  HENT:  Head: Normocephalic and atraumatic.  Right Ear: External ear normal.  Left Ear: External ear normal.  Nose: Nose normal.  Mouth/Throat: Oropharynx is clear and moist. No oropharyngeal exudate.  Eyes: Conjunctivae and EOM are normal. Pupils are equal, round, and reactive to light. Right eye exhibits no discharge. Left eye exhibits no discharge. No scleral icterus.  Neck: Normal range of motion. Neck supple. No tracheal deviation present. No thyromegaly present.  No carotid bruits  Cardiovascular: Normal rate, regular rhythm and normal heart sounds.  Exam reveals no gallop and no friction rub.     No murmur heard. The pedal pulses were somewhat difficult to palpate today. At 72/m  Pulmonary/Chest: Effort normal and breath sounds normal. No respiratory distress. He has no wheezes. He has no rales. He exhibits no tenderness.  Chest is clear anteriorly and posteriorly  Abdominal: Soft. Bowel sounds are normal. He exhibits no mass. There is no tenderness. There is no rebound and no guarding.  No abdominal bruits  Genitourinary:  The patient recently had his rectal exam with Dr. Jeffie Pollock.  Musculoskeletal: Normal range of motion. He exhibits no edema or tenderness.  Lymphadenopathy:    He has no cervical adenopathy.  Neurological: He is alert and oriented to person, place, and time. He has normal reflexes. No cranial nerve deficit.  Skin: Skin is warm and dry. No rash noted. No erythema. No pallor.  Psychiatric: He has a normal mood and affect. His behavior is normal. Judgment and thought content normal.  Nursing note and vitals reviewed.   BP 107/73 mmHg  Pulse 58  Temp(Src) 97.3 F (36.3 C) (Oral)  Ht 6\' 2"  (1.88 m)  Wt 215 lb (97.523 kg)  BMI 27.59 kg/m2        Assessment & Plan:  1. hyperlipidemia -Cholesterol numbers are good in the patient will continue with his current treatment which includes atorvastatin and omega-3 fatty acids  2. BPH (benign prostatic hypertrophy) -He will continue with follow-up by the urologist - PSA Total+%Free (Serial); Future  3. Paroxysmal atrial fibrillation -He will continue with follow-up by the cardiologist  4. High risk medication use -He will continue with his eliquis.  5. Palpitations -These only occur rarely and the patient is tolerating these bouts of atrial fib without issue.  6. Encounter for Hemoccult screening -He is not seen any visible blood but will return the fecal occult blood test - Fecal occult blood, imunochemical; Standing  Patient Instructions  Continue current medications. Continue good therapeutic  lifestyle changes which include good diet and exercise. Fall precautions discussed with patient. If an FOBT was given today- please return it to our front desk. If you are over 18 years old - you may need Prevnar 20 or the adult Pneumonia vaccine.  Flu Shots are still available at our office. If you still haven't had one please call to set up a nurse visit to get one.   After your visit with [place] today you will receive a survey in the mail or online from [employer] regarding your care with [place]. Please take a moment to fill this out. Your feedback is very important to [place] as you can help [place] better understand your patient needs as well as improve your experience and satisfaction. WE CARE ABOUT YOU!!!                        Medicare Annual Wellness Visit  [facility] and the medical providers at [facility] strive to bring you the best medical care.  In doing so we not only want to  address your current medical conditions and concerns but also to detect new conditions early and prevent illness, disease and health-related problems.    Medicare offers a yearly Wellness Visit which allows our clinical staff to assess your need for preventative services including immunizations, lifestyle education, counseling to decrease risk of preventable diseases and screening for fall risk and other medical concerns.    This visit is provided free of charge (no copay) for all Medicare recipients. The clinical pharmacists at [facility] have begun to conduct these Wellness Visits which will also include a thorough review of all your medications.    As you primary medical provider recommend that you make an appointment for your Annual Wellness Visit if you have not done so already this year.  You may set up this appointment before you leave today or you may call back ([phone]) and schedule an appointment.  Please make sure when you call that you mention that you are scheduling  your Annual Wellness Visit with the clinical pharmacist so that the appointment may be made for the proper length of time.    The patient should continue with follow-up with urology He should continue his excellent diet regimen and exercise regimen He should be careful not to put himself at risk for falling   Arrie Senate MD

## 2014-11-18 NOTE — Patient Instructions (Addendum)
Continue current medications. Continue good therapeutic lifestyle changes which include good diet and exercise. Fall precautions discussed with patient. If an FOBT was given today- please return it to our front desk. If you are over 68 years old - you may need Prevnar 10 or the adult Pneumonia vaccine.  Flu Shots are still available at our office. If you still haven't had one please call to set up a nurse visit to get one.   After your visit with [place] today you will receive a survey in the mail or online from [employer] regarding your care with [place]. Please take a moment to fill this out. Your feedback is very important to [place] as you can help [place] better understand your patient needs as well as improve your experience and satisfaction. WE CARE ABOUT YOU!!!                        Medicare Annual Wellness Visit  [facility] and the medical providers at [facility] strive to bring you the best medical care.  In doing so we not only want to address your current medical conditions and concerns but also to detect new conditions early and prevent illness, disease and health-related problems.    Medicare offers a yearly Wellness Visit which allows our clinical staff to assess your need for preventative services including immunizations, lifestyle education, counseling to decrease risk of preventable diseases and screening for fall risk and other medical concerns.    This visit is provided free of charge (no copay) for all Medicare recipients. The clinical pharmacists at [facility] have begun to conduct these Wellness Visits which will also include a thorough review of all your medications.    As you primary medical provider recommend that you make an appointment for your Annual Wellness Visit if you have not done so already this year.  You may set up this appointment before you leave today or you may call back ([phone]) and schedule an appointment.  Please make sure  when you call that you mention that you are scheduling your Annual Wellness Visit with the clinical pharmacist so that the appointment may be made for the proper length of time.    The patient should continue with follow-up with urology He should continue his excellent diet regimen and exercise regimen He should be careful not to put himself at risk for falling

## 2014-12-04 DIAGNOSIS — H2513 Age-related nuclear cataract, bilateral: Secondary | ICD-10-CM | POA: Diagnosis not present

## 2014-12-04 DIAGNOSIS — H40033 Anatomical narrow angle, bilateral: Secondary | ICD-10-CM | POA: Diagnosis not present

## 2015-01-07 ENCOUNTER — Encounter: Payer: Self-pay | Admitting: *Deleted

## 2015-01-14 ENCOUNTER — Other Ambulatory Visit: Payer: Self-pay | Admitting: Family Medicine

## 2015-02-10 DIAGNOSIS — S83281A Other tear of lateral meniscus, current injury, right knee, initial encounter: Secondary | ICD-10-CM | POA: Diagnosis not present

## 2015-02-10 DIAGNOSIS — M1990 Unspecified osteoarthritis, unspecified site: Secondary | ICD-10-CM | POA: Diagnosis not present

## 2015-02-10 DIAGNOSIS — M25561 Pain in right knee: Secondary | ICD-10-CM | POA: Diagnosis not present

## 2015-02-25 DIAGNOSIS — M7121 Synovial cyst of popliteal space [Baker], right knee: Secondary | ICD-10-CM | POA: Diagnosis not present

## 2015-02-25 DIAGNOSIS — S83281A Other tear of lateral meniscus, current injury, right knee, initial encounter: Secondary | ICD-10-CM | POA: Diagnosis not present

## 2015-02-25 DIAGNOSIS — M23341 Other meniscus derangements, anterior horn of lateral meniscus, right knee: Secondary | ICD-10-CM | POA: Diagnosis not present

## 2015-02-25 DIAGNOSIS — M179 Osteoarthritis of knee, unspecified: Secondary | ICD-10-CM | POA: Diagnosis not present

## 2015-02-25 DIAGNOSIS — M23361 Other meniscus derangements, other lateral meniscus, right knee: Secondary | ICD-10-CM | POA: Diagnosis not present

## 2015-02-25 DIAGNOSIS — M25461 Effusion, right knee: Secondary | ICD-10-CM | POA: Diagnosis not present

## 2015-03-03 DIAGNOSIS — M25561 Pain in right knee: Secondary | ICD-10-CM | POA: Diagnosis not present

## 2015-03-03 DIAGNOSIS — S83281A Other tear of lateral meniscus, current injury, right knee, initial encounter: Secondary | ICD-10-CM | POA: Diagnosis not present

## 2015-03-03 DIAGNOSIS — M1711 Unilateral primary osteoarthritis, right knee: Secondary | ICD-10-CM | POA: Diagnosis not present

## 2015-03-22 ENCOUNTER — Ambulatory Visit: Payer: Medicare Other | Attending: Sports Medicine | Admitting: Physical Therapy

## 2015-03-22 DIAGNOSIS — M25461 Effusion, right knee: Secondary | ICD-10-CM | POA: Diagnosis not present

## 2015-03-22 DIAGNOSIS — M25661 Stiffness of right knee, not elsewhere classified: Secondary | ICD-10-CM | POA: Insufficient documentation

## 2015-03-22 DIAGNOSIS — R6889 Other general symptoms and signs: Secondary | ICD-10-CM | POA: Diagnosis not present

## 2015-03-22 DIAGNOSIS — M25561 Pain in right knee: Secondary | ICD-10-CM

## 2015-03-22 NOTE — Therapy (Signed)
[facility] [place], [place], [address] Phone: [phone]   Fax:  [phone]  Physical Therapy Evaluation  Patient Details  Name: Jordan [name] MRN: [id number] Date of Birth: 23-Jun-1945 Referring Provider:  Donzetta Sprung., MD  Encounter Date: 03/22/2015      PT End of Session - 03/22/15 1034    Visit Number 1   Number of Visits 16   Date for PT Re-Evaluation 05/17/15   PT Start Time 1034   PT Stop Time 6283   PT Time Calculation (min) 52 min   Activity Tolerance Patient tolerated treatment well   Behavior During Therapy [facility] for tasks assessed/performed      Past Medical History  Diagnosis Date  . CAD (coronary artery disease)     Stent LAD, 2002  /  nuclear 2009, no ischemia  /    [facility] October, 2011 nuclear, no ischemia, possible slight apical scar, ejection fraction 70%  . Ejection fraction     EF 65-70%, echo, 2007 /  EF 70%, nuclear, 2011  . Atrial fibrillation     Paroxysmal  . Warfarin anticoagulation     Low CHADS , score, but patient wants to be aggressive  . Drug intolerance     Mild beta blocker intolerance  . Incomplete RBBB   . Syncope     November, 2010  . Carotid bruit     Doppler November, 2011, normal  . Knee pain     November, 201  . BPH (benign prostatic hyperplasia)   . Back pain   . Anxiety     Mild  . Dyslipidemia   . Colon polyps   . Palpitations   . IBS (irritable bowel syndrome)   . Personal history of colonic adenomas 11/29/2006    Qualifier: Diagnosis of  By: [place], [place]      Past Surgical History  Procedure Laterality Date  . Colonoscopy  multiple  . Back surgery  05/1990    [id number]  . Cardiac catheterization      There were no vitals filed for this visit.  Visit Diagnosis:  Right knee pain - Plan: PT plan of care cert/re-cert  Stiffness of right knee - Plan: PT plan of care cert/re-cert  Swelling of right knee joint - Plan: PT plan of care  cert/re-cert  Activity intolerance - Plan: PT plan of care cert/re-cert      Subjective Assessment - 03/22/15 1035    Subjective Patient reports his right knee started swelling and stiffening up iin August after working in his yard mowing.  Knee was drained in August. Since then he was stretching and icing it but it hasn't improved. Knee cap is also bothering him with lateral movements. Pt also c/o knee cap pain. He has increased swelling agter 30 min on knee. In the morning it is back to norma.   Pertinent History R lateral meniscus tear   Limitations Walking;Standing;Other (comment)  stairs   How long can you walk comfortably? less than 1/2 mile   Diagnostic tests MRI - arthritis, lateral meniscus tear   Currently in Pain? Yes   Pain Score 8    Pain Location Knee   Pain Orientation Right   Pain Descriptors / Indicators Aching   Pain Type Chronic pain   Pain Onset More than a month ago   Pain Frequency Intermittent   Aggravating Factors  standing and walking   Pain Relieving Factors ice  [facility] PT Assessment - 03/22/15 0001    Assessment   Medical Diagnosis Rt Knee pain   Onset Date/Surgical Date 01/20/15   Next MD Visit 03/31/15   Balance Screen   Has the patient fallen in the past 6 months No   Has the patient had a decrease in activity level because of a fear of falling?  No   Is the patient reluctant to leave their home because of a fear of falling?  No   Home Environment   Additional [place] house   Prior Function   Level of Independence Independent with basic ADLs   Vocation Full time employment   Observation/Other Assessments   Focus on Therapeutic Outcomes (FOTO)  66% limiited   Observation/Other Assessments-Edema    Edema Circumferential  44.5 cm L; 47.0 cm at mid patella   ROM / Strength   AROM / PROM / Strength AROM;Strength   AROM   AROM Assessment Site Knee   Right/Left Knee Right;Left   Right Knee Extension -7  full  passive ext   Right Knee Flexion 112  118 passive   Left Knee Extension 0   Left Knee Flexion 120   Strength   Overall Strength Comments Rt hip ABD/ext 4/5   Strength Assessment Site Knee   Right/Left Knee Right;Left   Right Knee Flexion 4-/5   Right Knee Extension 4+/5   Left Knee Flexion 4+/5   Left Knee Extension 5/5   Flexibility   Soft Tissue Assessment /Muscle Length --  tight HS, ITB, piriformis R   Palpation   Palpation comment tenderness of VLO, quad tendon and VLO; joint line med and lat; intermittent patellar tendon pain                   [facility] Adult PT Treatment/Exercise - 03/22/15 0001    Modalities   Modalities Electrical Stimulation;Cryotherapy   Cryotherapy   Number Minutes Cryotherapy 15 Minutes   Cryotherapy Location Knee  r   Type of Cryotherapy Ice pack   Electrical Stimulation   Electrical Stimulation Location R Knee   Electrical Stimulation Action IFC   Electrical Stimulation Parameters 1-10 Hz to tolerance x 15 min   Electrical Stimulation Goals Edema                PT Education - 03/22/15 1220    Education provided Yes   Education Details HEP   Person(s) Educated Patient   Methods Explanation;Demonstration;Handout   Comprehension Verbalized understanding;Returned demonstration          PT Short Term Goals - 03/22/15 1223    PT SHORT TERM GOAL #1   Title I with initial HEP (04/05/15)   Time 2   Period Weeks   Status New   PT SHORT TERM GOAL #2   Title decreased edema in R knee to within .5 cm of L (04/19/15)   Time 4   Period Weeks   Status New   PT SHORT TERM GOAL #3   Title decreased pain in R knee with amb to 5/10 or less (04/19/15)   Time 4   Period Weeks   Status New           PT Long Term Goals - 03/22/15 1225    PT LONG TERM GOAL #1   Title I with advanced HEP   Time 8   Period Weeks   Status New   PT LONG TERM GOAL #2   Title decreased pain in R  knee with standing and walking to 2/10 or less    Time 8   Period Weeks   Status New   PT LONG TERM GOAL #3   Title Able to climb stairs without increased pain in R knee   Time 8   Period Weeks   Status New   PT LONG TERM GOAL #4   Title improved LLE strength to 5-/5    Time 8   Period Weeks   Status New   PT LONG TERM GOAL #5   Title improved R knee ROM 2-120 degrees   Time 8   Period Weeks   Status New               Plan - 04/03/2015 1133    Clinical Impression Statement Patient is a 69 year old male with h/o of R lateral meniscus tear. He had a reoccurance of pain in August and now has daily pain and swelling primarily with walking and standing. He also c/o quadriceps tendon and patellar pain. He has decreased ROM and strength and edema affecting ADLs including stair climbing.   Pt will benefit from skilled therapeutic intervention in order to improve on the following deficits Decreased range of motion;Pain;Decreased activity tolerance;Increased edema;Decreased strength   Rehab Potential Good   PT Frequency 2x / week   PT Duration 8 weeks   PT Treatment/Interventions ADLs/Self Care Home Management;Cryotherapy;IT trainer;Therapeutic exercise;Manual techniques;Vasopneumatic Device;Taping;Patient/family education;Passive range of motion;Neuromuscular re-education;Balance training   PT Next Visit Plan start with low level quad strenghthening then progress, hip strenghthening. LE flexibility, modalities for edema, pain   PT Home Exercise Plan stretches: piriformis, ITB, quad, HS   Consulted and Agree with Plan of Care Patient          G-Codes - Apr 03, 2015 09/23/1226    Functional Assessment Tool Used FOTO 66% LIMITED   Functional Limitation Mobility: Walking and moving around   Mobility: Walking and Moving Around Current Status [phone]) At least 60 percent but less than 80 percent impaired, limited or restricted   Mobility: Walking and Moving Around Goal Status [phone]) At least 40 percent but  less than 60 percent impaired, limited or restricted       Problem List Patient Active Problem List   Diagnosis Date Noted  . Chest pain 02/23/2013  . CAD (coronary artery disease)   . Ejection fraction   . Atrial fibrillation ([place])   . Drug intolerance   . Incomplete RBBB   . Carotid bruit   . Knee pain   . Anxiety   . hyperlipidemia   . Palpitations   . BPH (benign prostatic hypertrophy) 02/06/2009  . Personal history of colonic adenomas 11/29/2006    Madelyn Flavors PT  04/03/15, 12:33 PM  [facility] [address] [place], [place], [address] Phone: [phone]   Fax:  [phone]

## 2015-03-22 NOTE — Patient Instructions (Signed)
  Hamstring Stretch, Reclined (Strap, Doorframe)   Lengthen bottom leg on floor. Extend top leg along edge of doorframe or press foot up into yoga strap. Hold for 30 seconds. Repeat 3_ times each leg.   Outer Hip Stretch: Reclined IT Band Stretch (Strap)   Strap around opposite foot, pull across only as far as possible with shoulders on mat. Hold for __30__ seconds. Repeat __3__ times each leg. 2-3 x/day.   Piriformis (Supine)  Cross legs, right on top. Gently pull other knee toward chest until stretch is felt in buttock/hip of top leg. Hold _30___ seconds. Repeat _3___ times per set. Do ____ sets per session. Do __2__ sessions per day.   Quadriceps (Prone)   On stomach with sheet around ankles, knees together, hips down, pull heels toward bottom. Keep hips flat. Hold __30__ seconds. Repeat _3__ times. Do __3__ sessions per day. CAUTION: Stretch should be gentle, steady and slow.  Madelyn Flavors, PT 03/22/2015 11:13 AM  [facility] [address] [place], [place], [address] Phone: [phone]   Fax:  [phone]

## 2015-03-25 ENCOUNTER — Ambulatory Visit: Payer: Medicare Other | Admitting: Physical Therapy

## 2015-03-25 ENCOUNTER — Ambulatory Visit: Payer: Medicare Other

## 2015-03-25 DIAGNOSIS — M25661 Stiffness of right knee, not elsewhere classified: Secondary | ICD-10-CM | POA: Diagnosis not present

## 2015-03-25 DIAGNOSIS — M25461 Effusion, right knee: Secondary | ICD-10-CM | POA: Diagnosis not present

## 2015-03-25 DIAGNOSIS — M25561 Pain in right knee: Secondary | ICD-10-CM | POA: Diagnosis not present

## 2015-03-25 DIAGNOSIS — R6889 Other general symptoms and signs: Secondary | ICD-10-CM

## 2015-03-25 NOTE — Therapy (Signed)
[facility] [place], [place], [address] Phone: [phone]   Fax:  [phone]  Physical Therapy Treatment  Patient Details  Name: Jordan [name] MRN: [id number] Date of Birth: 1945-12-11 Referring Provider:  Chipper Herb, MD  Encounter Date: 03/25/2015      PT End of Session - 03/25/15 1415    Visit Number 2   Number of Visits 16   Date for PT Re-Evaluation 05/17/15   PT Start Time 0151   PT Stop Time 0244   PT Time Calculation (min) 53 min   Activity Tolerance Patient tolerated treatment well   Behavior During Therapy [facility] for tasks assessed/performed      Past Medical History  Diagnosis Date  . CAD (coronary artery disease)     Stent LAD, 2002  /  nuclear 2009, no ischemia  /    [facility] October, 2011 nuclear, no ischemia, possible slight apical scar, ejection fraction 70%  . Ejection fraction     EF 65-70%, echo, 2007 /  EF 70%, nuclear, 2011  . Atrial fibrillation     Paroxysmal  . Warfarin anticoagulation     Low CHADS , score, but patient wants to be aggressive  . Drug intolerance     Mild beta blocker intolerance  . Incomplete RBBB   . Syncope     November, 2010  . Carotid bruit     Doppler November, 2011, normal  . Knee pain     November, 201  . BPH (benign prostatic hyperplasia)   . Back pain   . Anxiety     Mild  . Dyslipidemia   . Colon polyps   . Palpitations   . IBS (irritable bowel syndrome)   . Personal history of colonic adenomas 11/29/2006    Qualifier: Diagnosis of  By: [place], [place]      Past Surgical History  Procedure Laterality Date  . Colonoscopy  multiple  . Back surgery  05/1990    L4L5S1  . Cardiac catheterization      There were no vitals filed for this visit.  Visit Diagnosis:  Right knee pain  Stiffness of right knee  Swelling of right knee joint  Activity intolerance                       [facility] Adult PT Treatment/Exercise -  03/25/15 0001    Exercises   Exercises Knee/Hip   Knee/Hip Exercises: Aerobic   Stationary Bike Level 2 x 15 minutes.   Knee/Hip Exercises: Supine   Quad Sets Limitations SAQ's non-resisted x 15 minutes facilitated with VMS to right medial quadriceps 10 sec extension holds and 10 sec rest)   Modalities   Modalities Vasopneumatic   Vasopneumatic   Number Minutes Vasopneumatic  15 minutes   Vasopnuematic Location  --  Right knee.   Vasopneumatic Pressure Medium                  PT Short Term Goals - 03/22/15 1223    PT SHORT TERM GOAL #1   Title I with initial HEP (04/05/15)   Time 2   Period Weeks   Status New   PT SHORT TERM GOAL #2   Title decreased edema in R knee to within .5 cm of L (04/19/15)   Time 4   Period Weeks   Status New   PT SHORT TERM GOAL #3   Title decreased pain in R knee with amb to  5/10 or less (04/19/15)   Time 4   Period Weeks   Status New           PT Long Term Goals - 03/22/15 1225    PT LONG TERM GOAL #1   Title I with advanced HEP   Time 8   Period Weeks   Status New   PT LONG TERM GOAL #2   Title decreased pain in R knee with standing and walking to 2/10 or less   Time 8   Period Weeks   Status New   PT LONG TERM GOAL #3   Title Able to climb stairs without increased pain in R knee   Time 8   Period Weeks   Status New   PT LONG TERM GOAL #4   Title improved LLE strength to 5-/5    Time 8   Period Weeks   Status New   PT LONG TERM GOAL #5   Title improved R knee ROM 2-120 degrees   Time 8   Period Weeks   Status New               Problem List Patient Active Problem List   Diagnosis Date Noted  . Chest pain 02/23/2013  . CAD (coronary artery disease)   . Ejection fraction   . Atrial fibrillation ([place])   . Drug intolerance   . Incomplete RBBB   . Carotid bruit   . Knee pain   . Anxiety   . hyperlipidemia   . Palpitations   . BPH (benign prostatic hypertrophy) 02/06/2009  . Personal history of  colonic adenomas 11/29/2006    Matia Zelada, [place] MPT 03/25/2015, 3:42 PM  [facility] [address] [place], [place], [address] Phone: [phone]   Fax:  [phone]

## 2015-03-26 ENCOUNTER — Ambulatory Visit (INDEPENDENT_AMBULATORY_CARE_PROVIDER_SITE_OTHER): Payer: Medicare Other

## 2015-03-26 DIAGNOSIS — Z23 Encounter for immunization: Secondary | ICD-10-CM

## 2015-03-29 ENCOUNTER — Ambulatory Visit: Payer: Medicare Other | Admitting: Physical Therapy

## 2015-03-29 DIAGNOSIS — R6889 Other general symptoms and signs: Secondary | ICD-10-CM | POA: Diagnosis not present

## 2015-03-29 DIAGNOSIS — M25561 Pain in right knee: Secondary | ICD-10-CM

## 2015-03-29 DIAGNOSIS — M25661 Stiffness of right knee, not elsewhere classified: Secondary | ICD-10-CM | POA: Diagnosis not present

## 2015-03-29 DIAGNOSIS — M25461 Effusion, right knee: Secondary | ICD-10-CM | POA: Diagnosis not present

## 2015-03-29 NOTE — Therapy (Signed)
[facility] [place], [place], [address] Phone: [phone]   Fax:  [phone]  Physical Therapy Treatment  Patient Details  Name: Jordan [name] MRN: [id number] Date of Birth: 03/13/1946 No Data Recorded  Encounter Date: 03/29/2015      PT End of Session - 03/29/15 1433    Visit Number 3   Number of Visits 16   Date for PT Re-Evaluation 05/17/15   PT Start Time 3335   PT Stop Time 1533   PT Time Calculation (min) 60 min   Activity Tolerance Patient tolerated treatment well   Behavior During Therapy [facility] for tasks assessed/performed      Past Medical History  Diagnosis Date  . CAD (coronary artery disease)     Stent LAD, 2002  /  nuclear 2009, no ischemia  /    [facility] October, 2011 nuclear, no ischemia, possible slight apical scar, ejection fraction 70%  . Ejection fraction     EF 65-70%, echo, 2007 /  EF 70%, nuclear, 2011  . Atrial fibrillation     Paroxysmal  . Warfarin anticoagulation     Low CHADS , score, but patient wants to be aggressive  . Drug intolerance     Mild beta blocker intolerance  . Incomplete RBBB   . Syncope     November, 2010  . Carotid bruit     Doppler November, 2011, normal  . Knee pain     November, 201  . BPH (benign prostatic hyperplasia)   . Back pain   . Anxiety     Mild  . Dyslipidemia   . Colon polyps   . Palpitations   . IBS (irritable bowel syndrome)   . Personal history of colonic adenomas 11/29/2006    Qualifier: Diagnosis of  By: [place], [place]      Past Surgical History  Procedure Laterality Date  . Colonoscopy  multiple  . Back surgery  05/1990    [id number]  . Cardiac catheterization      There were no vitals filed for this visit.  Visit Diagnosis:  Stiffness of right knee  Right knee pain  Swelling of right knee joint      Subjective Assessment - 03/29/15 1433    Subjective No new complaints. Pt states the knee is a little bit looser, but  still swelling.    Currently in Pain? Yes   Pain Score 5    Pain Location Knee   Pain Orientation Right   Pain Descriptors / Indicators Aching   Pain Type Chronic pain                         [facility] Adult PT Treatment/Exercise - 03/29/15 0001    Knee/Hip Exercises: Stretches   Active Hamstring Stretch 3 reps;60 seconds   Quad Stretch 2 reps;60 seconds   Piriformis Stretch 3 reps;60 seconds   Other Knee/Hip Stretches ITB with strap 3x 60 seconds   Knee/Hip Exercises: Aerobic   Stationary Bike Level 2 x 12 minutes.   Knee/Hip Exercises: Standing   Heel Raises Both;20 reps  straight and toes out   Hip Abduction Stengthening;Both;2 sets;10 reps   Knee/Hip Exercises: Supine   Short Arc Quad Sets Strengthening;Right;20 reps  2#   Knee/Hip Exercises: Sidelying   Hip ABduction Right;2 sets;5 reps  very difficult when using correct form   Clams 20   Modalities   Modalities Vasopneumatic   Vasopneumatic   Number Minutes  Vasopneumatic  15 minutes   Vasopnuematic Location  Knee   Vasopneumatic Pressure Medium   Vasopneumatic Temperature  3*                PT Education - 03/29/15 1537    Education provided Yes   Education Details HEP   Person(s) Educated Patient   Methods Demonstration;Explanation;Handout   Comprehension Verbalized understanding;Returned demonstration          PT Short Term Goals - 03/22/15 1223    PT SHORT TERM GOAL #1   Title I with initial HEP (04/05/15)   Time 2   Period Weeks   Status New   PT SHORT TERM GOAL #2   Title decreased edema in R knee to within .5 cm of L (04/19/15)   Time 4   Period Weeks   Status New   PT SHORT TERM GOAL #3   Title decreased pain in R knee with amb to 5/10 or less (04/19/15)   Time 4   Period Weeks   Status New           PT Long Term Goals - 03/22/15 1225    PT LONG TERM GOAL #1   Title I with advanced HEP   Time 8   Period Weeks   Status New   PT LONG TERM GOAL #2   Title  decreased pain in R knee with standing and walking to 2/10 or less   Time 8   Period Weeks   Status New   PT LONG TERM GOAL #3   Title Able to climb stairs without increased pain in R knee   Time 8   Period Weeks   Status New   PT LONG TERM GOAL #4   Title improved LLE strength to 5-/5    Time 8   Period Weeks   Status New   PT LONG TERM GOAL #5   Title improved R knee ROM 2-120 degrees   Time 8   Period Weeks   Status New               Plan - 03/29/15 1532    Clinical Impression Statement Patient feels his knee is looser and he does demo increased flexibility in L HS vs eval , although it is still tight. He had difficulty with s/l hip ABD when using correct form. Tolerated Knee strenghthening without increased pain.   PT Next Visit Plan Continue low level quad strenghthening, hip strenghthening. LE flexibility, modalities for edema, pain   PT Home Exercise Plan bridge, standing hip ABD, clam and heel raises   Consulted and Agree with Plan of Care Patient        Problem List Patient Active Problem List   Diagnosis Date Noted  . Chest pain 02/23/2013  . CAD (coronary artery disease)   . Ejection fraction   . Atrial fibrillation ([place])   . Drug intolerance   . Incomplete RBBB   . Carotid bruit   . Knee pain   . Anxiety   . hyperlipidemia   . Palpitations   . BPH (benign prostatic hypertrophy) 02/06/2009  . Personal history of colonic adenomas 11/29/2006   Madelyn Flavors PT  03/29/2015, 3:38 PM  [facility] [address] [place], [place], [address] Phone: [phone]   Fax:  [phone]  Name: Jordan [name] MRN: [id number] Date of Birth: 1945/12/23

## 2015-04-01 ENCOUNTER — Ambulatory Visit: Payer: Medicare Other | Admitting: Physical Therapy

## 2015-04-01 DIAGNOSIS — M25561 Pain in right knee: Secondary | ICD-10-CM | POA: Diagnosis not present

## 2015-04-01 DIAGNOSIS — M25461 Effusion, right knee: Secondary | ICD-10-CM

## 2015-04-01 DIAGNOSIS — M25661 Stiffness of right knee, not elsewhere classified: Secondary | ICD-10-CM | POA: Diagnosis not present

## 2015-04-01 DIAGNOSIS — R6889 Other general symptoms and signs: Secondary | ICD-10-CM | POA: Diagnosis not present

## 2015-04-01 NOTE — Therapy (Signed)
[facility] [place], [place], [address] Phone: [phone]   Fax:  [phone]  Physical Therapy Treatment  Patient Details  Name: Jordan [name] MRN: [id number] Date of Birth: [birth year] No Data Recorded  Encounter Date: 04/01/2015      PT End of Session - 04/01/15 1120    Visit Number 4   Number of Visits 16   Date for PT Re-Evaluation 05/17/15   PT Start Time 1115   PT Stop Time 1216   PT Time Calculation (min) 61 min   Activity Tolerance Patient tolerated treatment well   Behavior During Therapy [facility] for tasks assessed/performed      Past Medical History  Diagnosis Date  . CAD (coronary artery disease)     Stent LAD, 2002  /  nuclear 2009, no ischemia  /    [facility] October, 2011 nuclear, no ischemia, possible slight apical scar, ejection fraction 70%  . Ejection fraction     EF 65-70%, echo, 2007 /  EF 70%, nuclear, 2011  . Atrial fibrillation     Paroxysmal  . Warfarin anticoagulation     Low CHADS , score, but patient wants to be aggressive  . Drug intolerance     Mild beta blocker intolerance  . Incomplete RBBB   . Syncope     November, 2010  . Carotid bruit     Doppler November, 2011, normal  . Knee pain     November, 201  . BPH (benign prostatic hyperplasia)   . Back pain   . Anxiety     Mild  . Dyslipidemia   . Colon polyps   . Palpitations   . IBS (irritable bowel syndrome)   . Personal history of colonic adenomas 11/29/2006    Qualifier: Diagnosis of  By: [place], [place]      Past Surgical History  Procedure Laterality Date  . Colonoscopy  multiple  . Back surgery  05/1990    [id number]  . Cardiac catheterization      There were no vitals filed for this visit.  Visit Diagnosis:  Right knee pain  Swelling of right knee joint  Stiffness of right knee  Activity intolerance      Subjective Assessment - 04/01/15 1120    Subjective Knee is still swollen but not as stiff.   Patient Stated Goals would like to get back on his spinner   Currently in Pain? Yes   Pain Score 4    Pain Location Knee   Pain Orientation Right   Pain Descriptors / Indicators Aching   Pain Type Chronic pain                         [facility] Adult PT Treatment/Exercise - 04/01/15 0001    Knee/Hip Exercises: Aerobic   Nustep L5 x 10 min   Knee/Hip Exercises: Supine   Short Arc Quad Sets Strengthening;Right;3 sets;10 reps  3#   Terminal Knee Extension Strengthening;Right;20 reps   Bridges with Clamshell 10 reps;Strengthening;Both   Straight Leg Raises Strengthening;Right;5 reps;3 sets  3#   Knee/Hip Exercises: Sidelying   Hip ABduction Right;Strengthening;2 sets;15 reps   Clams 20   Vasopneumatic   Number Minutes Vasopneumatic  15 minutes   Vasopnuematic Location  Knee   Vasopneumatic Pressure Medium   Vasopneumatic Temperature  34    Manual Therapy   Manual Therapy Myofascial release;Passive ROM   Myofascial Release xfriction massage to patellar tendon at distal patella;  sidelying R ITB release   Passive ROM R quad stretch in sidelying                PT Education - 04/01/15 1215    Education provided Yes   Education Details Education on foam roller for myofascial release.   Person(s) Educated Patient   Methods Explanation;Demonstration   Comprehension Verbalized understanding          PT Short Term Goals - 04/01/15 1209    PT SHORT TERM GOAL #1   Title I with initial HEP (04/05/15)   Time 2   Period Weeks   Status On-going   PT SHORT TERM GOAL #2   Title decreased edema in R knee to within .5 cm of L (04/19/15)   Period Weeks   Status On-going   PT SHORT TERM GOAL #3   Title decreased pain in R knee with amb to 5/10 or less (04/19/15)   Time 4   Period Weeks   Status On-going           PT Long Term Goals - 03/22/15 1225    PT LONG TERM GOAL #1   Title I with advanced HEP   Time 8   Period Weeks   Status New   PT LONG TERM  GOAL #2   Title decreased pain in R knee with standing and walking to 2/10 or less   Time 8   Period Weeks   Status New   PT LONG TERM GOAL #3   Title Able to climb stairs without increased pain in R knee   Time 8   Period Weeks   Status New   PT LONG TERM GOAL #4   Title improved LLE strength to 5-/5    Time 8   Period Weeks   Status New   PT LONG TERM GOAL #5   Title improved R knee ROM 2-120 degrees   Time 8   Period Weeks   Status New               Plan - 04/01/15 1205    Clinical Impression Statement Patient is progresing toward goals, however none have been met yet. He tolerated increased therex today with only mild pain in patellar tendon with bridging.   Pt will benefit from skilled therapeutic intervention in order to improve on the following deficits Decreased range of motion;Pain;Decreased activity tolerance;Increased edema;Decreased strength   Rehab Potential Good   PT Frequency 2x / week   PT Duration 8 weeks   PT Treatment/Interventions ADLs/Self Care Home Management;Cryotherapy;Electrical Stimulation;Ultrasound;Stair training;Therapeutic exercise;Manual techniques;Vasopneumatic Device;Taping;Patient/family education;Passive range of motion;Neuromuscular re-education;Balance training   PT Next Visit Plan Continue quad strenghthening, hip strenghthening. LE flexibility, modalities for edema, pain. Possibly try US to distal knee cap if pain continues,   Consulted and Agree with Plan of Care Patient        Problem List Patient Active Problem List   Diagnosis Date Noted  . Chest pain 02/23/2013  . CAD (coronary artery disease)   . Ejection fraction   . Atrial fibrillation ([facility])   . Drug intolerance   . Incomplete RBBB   . Carotid bruit   . Knee pain   . Anxiety   . hyperlipidemia   . Palpitations   . BPH (benign prostatic hypertrophy) 02/06/2009  . Personal history of colonic adenomas 11/29/2006    Julie Riddles PT  04/01/2015, 12:17 PM  [facility] [address] [place], [place], [address]   Phone: [phone]   Fax:  [phone]  Name: Jordan [name] MRN: [id number] Date of Birth: 12/14/1945     

## 2015-04-05 ENCOUNTER — Ambulatory Visit: Payer: Medicare Other | Admitting: Physical Therapy

## 2015-04-05 DIAGNOSIS — R6889 Other general symptoms and signs: Secondary | ICD-10-CM | POA: Diagnosis not present

## 2015-04-05 DIAGNOSIS — M25661 Stiffness of right knee, not elsewhere classified: Secondary | ICD-10-CM

## 2015-04-05 DIAGNOSIS — M25561 Pain in right knee: Secondary | ICD-10-CM | POA: Diagnosis not present

## 2015-04-05 DIAGNOSIS — M25461 Effusion, right knee: Secondary | ICD-10-CM

## 2015-04-05 NOTE — Therapy (Signed)
[facility] [place], [place], [address] Phone: [phone]   Fax:  [phone]  Physical Therapy Treatment  Patient Details  Name: Jordan [name] MRN: [id number] Date of Birth: April 07, 1946 No Data Recorded  Encounter Date: 04/05/2015      PT End of Session - 04/05/15 1208    Visit Number 5   Number of Visits 16   Date for PT Re-Evaluation 05/17/15   PT Start Time 1204   PT Stop Time 1304   PT Time Calculation (min) 60 min   Activity Tolerance Patient tolerated treatment well   Behavior During Therapy [facility] for tasks assessed/performed      Past Medical History  Diagnosis Date  . CAD (coronary artery disease)     Stent LAD, 2002  /  nuclear 2009, no ischemia  /    [facility] October, 2011 nuclear, no ischemia, possible slight apical scar, ejection fraction 70%  . Ejection fraction     EF 65-70%, echo, 2007 /  EF 70%, nuclear, 2011  . Atrial fibrillation     Paroxysmal  . Warfarin anticoagulation     Low CHADS , score, but patient wants to be aggressive  . Drug intolerance     Mild beta blocker intolerance  . Incomplete RBBB   . Syncope     November, 2010  . Carotid bruit     Doppler November, 2011, normal  . Knee pain     November, 201  . BPH (benign prostatic hyperplasia)   . Back pain   . Anxiety     Mild  . Dyslipidemia   . Colon polyps   . Palpitations   . IBS (irritable bowel syndrome)   . Personal history of colonic adenomas 11/29/2006    Qualifier: Diagnosis of  By: [place], [place]      Past Surgical History  Procedure Laterality Date  . Colonoscopy  multiple  . Back surgery  05/1990    L4L5S1  . Cardiac catheterization      There were no vitals filed for this visit.  Visit Diagnosis:  Right knee pain  Swelling of right knee joint  Stiffness of right knee  Activity intolerance      Subjective Assessment - 04/05/15 1208    Subjective I think the swelling is going down. He gets  up in the morning and it is not as stiff and the swelling doesn't increase like it was. Still having some pain. He tried his spinner and was fine sitting but had pain in the knee cap with standing on it.   Patient Stated Goals would like to get back on his spinner   Currently in Pain? Yes   Pain Score 4    Pain Location Knee   Pain Orientation Right   Pain Descriptors / Indicators Aching   Pain Type Chronic pain            [facility] PT Assessment - 04/05/15 0001    Assessment   Medical Diagnosis Rt Knee pain   Onset Date/Surgical Date 01/20/15   Next MD Visit 04/1715   Observation/Other Assessments-Edema    Edema Circumferential  R 45.8 cm                     [facility] Adult PT Treatment/Exercise - 04/05/15 0001    Knee/Hip Exercises: Aerobic   Stationary Bike level 3 x 10 min   Knee/Hip Exercises: Standing   Step Down 2 sets;10 reps;Hand Hold: 2;Right  heel taps   Functional Squat 10 reps  decline squat heels on foam beam   Knee/Hip Exercises: Seated   Long Arc Quad Strengthening;Right;3 sets;10 reps  eccentric lowering slowly   Long Arc Quad Weight 3 lbs.   Knee/Hip Exercises: Supine   Short Arc Quad Sets Strengthening;Right;3 sets;10 reps  4#   Straight Leg Raises --  attempted but quad fatigued   Knee/Hip Exercises: Sidelying   Hip ABduction Right;Strengthening;2 sets;10 reps   Clams 20   Modalities   Modalities Ultrasound   Ultrasound   Ultrasound Location L patellar tendon   Ultrasound Parameters 1.5 wcm2 1 mhz 50% duty x 10   Ultrasound Goals Pain                  PT Short Term Goals - 04/01/15 1209    PT SHORT TERM GOAL #1   Title I with initial HEP (04/05/15)   Time 2   Period Weeks   Status On-going   PT SHORT TERM GOAL #2   Title decreased edema in R knee to within .5 cm of L (04/19/15)   Period Weeks   Status On-going   PT SHORT TERM GOAL #3   Title decreased pain in R knee with amb to 5/10 or less (04/19/15)   Time 4   Period  Weeks   Status On-going           PT Long Term Goals - 03/22/15 1225    PT LONG TERM GOAL #1   Title I with advanced HEP   Time 8   Period Weeks   Status New   PT LONG TERM GOAL #2   Title decreased pain in R knee with standing and walking to 2/10 or less   Time 8   Period Weeks   Status New   PT LONG TERM GOAL #3   Title Able to climb stairs without increased pain in R knee   Time 8   Period Weeks   Status New   PT LONG TERM GOAL #4   Title improved LLE strength to 5-/5    Time 8   Period Weeks   Status New   PT LONG TERM GOAL #5   Title improved R knee ROM 2-120 degrees   Time 8   Period Weeks   Status New               Plan - 04/05/15 1307    Clinical Impression Statement Patient has significant reduction in edema although some still remains and goal not met yet. He did well with eccentric quad strenthening with complaints only of pulling, not pain. Sitll fatiques fairly easily with hp ABD and SAQ/   Pt will benefit from skilled therapeutic intervention in order to improve on the following deficits Decreased range of motion;Pain;Decreased activity tolerance;Increased edema;Decreased strength   PT Next Visit Plan Continue quad strenghthening, hip strenghthening. LE flexibility, modalities for edema, pain.Assess [place].        Problem List Patient Active Problem List   Diagnosis Date Noted  . Chest pain 02/23/2013  . CAD (coronary artery disease)   . Ejection fraction   . Atrial fibrillation ([place])   . Drug intolerance   . Incomplete RBBB   . Carotid bruit   . Knee pain   . Anxiety   . hyperlipidemia   . Palpitations   . BPH (benign prostatic hypertrophy) 02/06/2009  . Personal history of colonic adenomas 11/29/2006    Madelyn Flavors PT  04/05/2015, 1:13 PM  [facility] [place], [place], [address] Phone: [phone]   Fax:  [phone]  Name: BOSTON COOKSON MRN: [id number] Date of  Birth: 1945-10-08

## 2015-04-08 ENCOUNTER — Encounter: Payer: Self-pay | Admitting: Physical Therapy

## 2015-04-13 ENCOUNTER — Ambulatory Visit: Payer: Medicare Other | Attending: Sports Medicine | Admitting: Physical Therapy

## 2015-04-13 DIAGNOSIS — M25661 Stiffness of right knee, not elsewhere classified: Secondary | ICD-10-CM | POA: Diagnosis not present

## 2015-04-13 DIAGNOSIS — M25561 Pain in right knee: Secondary | ICD-10-CM | POA: Diagnosis not present

## 2015-04-13 DIAGNOSIS — R6889 Other general symptoms and signs: Secondary | ICD-10-CM | POA: Diagnosis not present

## 2015-04-13 DIAGNOSIS — M25461 Effusion, right knee: Secondary | ICD-10-CM | POA: Diagnosis not present

## 2015-04-13 NOTE — Therapy (Signed)
[facility] [place], [place], [address] Phone: [phone]   Fax:  [phone]  Physical Therapy Treatment  Patient Details  Name: Jordan [name] MRN: [id number] Date of Birth: [birth year] No Data Recorded  Encounter Date: 04/13/2015      PT End of Session - 04/13/15 1303    Visit Number 6   Number of Visits 16   Date for PT Re-Evaluation 05/17/15   PT Start Time 1302   PT Stop Time 1405   PT Time Calculation (min) 63 min   Activity Tolerance Patient tolerated treatment well   Behavior During Therapy [facility] for tasks assessed/performed      Past Medical History  Diagnosis Date  . CAD (coronary artery disease)     Stent LAD, 2002  /  nuclear 2009, no ischemia  /    [facility] October, 2011 nuclear, no ischemia, possible slight apical scar, ejection fraction 70%  . Ejection fraction     EF 65-70%, echo, 2007 /  EF 70%, nuclear, 2011  . Atrial fibrillation     Paroxysmal  . Warfarin anticoagulation     Low CHADS , score, but patient wants to be aggressive  . Drug intolerance     Mild beta blocker intolerance  . Incomplete RBBB   . Syncope     November, 2010  . Carotid bruit     Doppler November, 2011, normal  . Knee pain     November, 201  . BPH (benign prostatic hyperplasia)   . Back pain   . Anxiety     Mild  . Dyslipidemia   . Colon polyps   . Palpitations   . IBS (irritable bowel syndrome)   . Personal history of colonic adenomas 11/29/2006    Qualifier: Diagnosis of  By: [place], [place]      Past Surgical History  Procedure Laterality Date  . Colonoscopy  multiple  . Back surgery  05/1990    L4L5S1  . Cardiac catheterization      There were no vitals filed for this visit.  Visit Diagnosis:  Swelling of right knee joint  Right knee pain  Stiffness of right knee      Subjective Assessment - 04/13/15 1303    Subjective Patient reports he had to kneel down to fix a plumbing problem on  04/07/15 and re-injured his right knee. The swellling immediately returned and he is having increased stiffness with ambulation. He did not hear any popping or anything and feels the discomfort he now has is from the swelling. Prior to this, patient states he felt it was really doing well prior to reinjuring it.   Patient Stated Goals to be able to ski with his grandkids this winter.   Currently in Pain? Yes   Pain Score 7    Pain Location Knee   Pain Orientation Right   Pain Descriptors / Indicators Aching   Pain Type Chronic pain   Pain Onset More than a month ago   Pain Frequency Intermittent            [facility] PT Assessment - 04/13/15 0001    Observation/Other Assessments-Edema    Edema Circumferential  46 cm   AROM   Right Knee Extension -3   Right Knee Flexion 115   Palpation   Palpation comment medial joint line R knee                     [facility] Adult PT  Treatment/Exercise - 04/13/15 0001    Knee/Hip Exercises: Aerobic   Stationary Bike level 3 x 10 min   Knee/Hip Exercises: Supine   Quad Sets Strengthening;Right;1 set;10 reps   Short Arc Quad Sets Strengthening;Right;3 sets;10 reps   Straight Leg Raises Right;Strengthening;2 sets;10 reps   Other Supine Knee/Hip Exercises HS set x 20   Modalities   Modalities Ultrasound;Associate [employer]   Electrical Stimulation Parameters 1-10hz  x 15 min to tolerance   Electrical Stimulation Goals Edema;Pain   Ultrasound   Ultrasound Location R inf patella and med joint line   Ultrasound Parameters 1.5 wcm2, 3.3 mhz 50% duty x 8 min   Ultrasound Goals Pain   Vasopneumatic   Number Minutes Vasopneumatic  15 minutes   Vasopnuematic Location  Knee   Vasopneumatic Pressure Medium   Vasopneumatic Temperature  34                   PT Short Term Goals - 04/01/15 1209    PT SHORT TERM GOAL  #1   Title I with initial HEP (04/05/15)   Time 2   Period Weeks   Status On-going   PT SHORT TERM GOAL #2   Title decreased edema in R knee to within .5 cm of L (04/19/15)   Period Weeks   Status On-going   PT SHORT TERM GOAL #3   Title decreased pain in R knee with amb to 5/10 or less (04/19/15)   Time 4   Period Weeks   Status On-going           PT Long Term Goals - 03/22/15 1225    PT LONG TERM GOAL #1   Title I with advanced HEP   Time 8   Period Weeks   Status New   PT LONG TERM GOAL #2   Title decreased pain in R knee with standing and walking to 2/10 or less   Time 8   Period Weeks   Status New   PT LONG TERM GOAL #3   Title Able to climb stairs without increased pain in R knee   Time 8   Period Weeks   Status New   PT LONG TERM GOAL #4   Title improved LLE strength to 5-/5    Time 8   Period Weeks   Status New   PT LONG TERM GOAL #5   Title improved R knee ROM 2-120 degrees   Time 8   Period Weeks   Status New               Plan - 04/13/15 1515    Clinical Impression Statement Patient presents today with reports of increased stiffness and swelling due to kneeling on R knee last week. He tolerated bike and low level knee strengthening without increased pain and reports decreased pain and stiffness following modalities. Active knee flexion/ext has actually increased. Advised patient to return to stretching and HEP carefully and as tolerated.   Pt will benefit from skilled therapeutic intervention in order to improve on the following deficits Decreased range of motion;Pain;Decreased activity tolerance;Increased edema;Decreased strength   Rehab Potential Good   PT Frequency 2x / week   PT Duration 8 weeks   PT Treatment/Interventions ADLs/Self Care Home Management;Cryotherapy;IT trainer;Therapeutic exercise;Manual techniques;Vasopneumatic Device;Taping;Patient/family education;Passive range of motion;Neuromuscular  re-education;Balance training   PT Next Visit Plan Continue quad strenghthening, hip strenghthening. LE flexibility,  modalities for edema, pain.   Consulted and Agree with Plan of Care Patient        Problem List Patient Active Problem List   Diagnosis Date Noted  . Chest pain 02/23/2013  . CAD (coronary artery disease)   . Ejection fraction   . Atrial fibrillation ([place])   . Drug intolerance   . Incomplete RBBB   . Carotid bruit   . Knee pain   . Anxiety   . hyperlipidemia   . Palpitations   . BPH (benign prostatic hypertrophy) 02/06/2009  . Personal history of colonic adenomas 11/29/2006    Madelyn Flavors PT  04/13/2015, 3:21 PM  [facility] [address] [place], [place], [address] Phone: [phone]   Fax:  [phone]  Name: Jordan [name] MRN: [id number] Date of Birth: [birth year]

## 2015-04-15 ENCOUNTER — Ambulatory Visit: Payer: Medicare Other | Admitting: Physical Therapy

## 2015-04-15 DIAGNOSIS — M25561 Pain in right knee: Secondary | ICD-10-CM

## 2015-04-15 DIAGNOSIS — M25661 Stiffness of right knee, not elsewhere classified: Secondary | ICD-10-CM | POA: Diagnosis not present

## 2015-04-15 DIAGNOSIS — M25461 Effusion, right knee: Secondary | ICD-10-CM | POA: Diagnosis not present

## 2015-04-15 DIAGNOSIS — R6889 Other general symptoms and signs: Secondary | ICD-10-CM | POA: Diagnosis not present

## 2015-04-15 NOTE — Therapy (Signed)
[facility] [place], [place], [address] Phone: [phone]   Fax:  [phone]  Physical Therapy Treatment  Patient Details  Name: Jordan [name] MRN: [id number] Date of Birth: [birth year] No Data Recorded  Encounter Date: 04/15/2015      PT End of Session - 04/15/15 1307    Visit Number 7   Number of Visits 16   Date for PT Re-Evaluation 05/17/15   PT Start Time 1307   PT Stop Time 1408   PT Time Calculation (min) 61 min   Activity Tolerance Patient tolerated treatment well   Behavior During Therapy [facility] for tasks assessed/performed      Past Medical History  Diagnosis Date  . CAD (coronary artery disease)     Stent LAD, 2002  /  nuclear 2009, no ischemia  /    [facility] October, 2011 nuclear, no ischemia, possible slight apical scar, ejection fraction 70%  . Ejection fraction     EF 65-70%, echo, 2007 /  EF 70%, nuclear, 2011  . Atrial fibrillation     Paroxysmal  . Warfarin anticoagulation     Low CHADS , score, but patient wants to be aggressive  . Drug intolerance     Mild beta blocker intolerance  . Incomplete RBBB   . Syncope     November, 2010  . Carotid bruit     Doppler November, 2011, normal  . Knee pain     November, 201  . BPH (benign prostatic hyperplasia)   . Back pain   . Anxiety     Mild  . Dyslipidemia   . Colon polyps   . Palpitations   . IBS (irritable bowel syndrome)   . Personal history of colonic adenomas 11/29/2006    Qualifier: Diagnosis of  By: [place], [place]      Past Surgical History  Procedure Laterality Date  . Colonoscopy  multiple  . Back surgery  05/1990    [id number]  . Cardiac catheterization      There were no vitals filed for this visit.  Visit Diagnosis:  Right knee pain  Stiffness of right knee  Swelling of right knee joint      Subjective Assessment - 04/15/15 1308    Subjective Patient reports no pain just stiffness. However, swelling persists  with weightbearing.   Currently in Pain? No/denies                         [facility] Adult PT Treatment/Exercise - 04/15/15 0001    Knee/Hip Exercises: Stretches   Other Knee/Hip Stretches hip adductor lunge stretch bil 3 x 30 sec   Knee/Hip Exercises: Machines for Strengthening   Other Machine Elliptical Ramp 5, resistance 1 x 5 min   Knee/Hip Exercises: Standing   Functional Squat 20 reps  with heels on foam beam   SLS heel taps 2inch x 20; 4 inch x 10   Knee/Hip Exercises: Seated   Long Arc Quad Strengthening;Right;20 reps  with eccentric lowering 5 count   Long Arc Quad Weight 5 lbs.   Modalities   Modalities Ultrasound;Vasopneumatic;Electrical Stimulation   Electrical Stimulation   Electrical Stimulation Location R knee   Electrical Stimulation Action premod   Electrical Stimulation Parameters 1-10hz  to tolerance x 15   Electrical Stimulation Goals Edema   Ultrasound   Ultrasound Location R inf patella and medial joint line   Ultrasound Parameters 1.5 wcm2 50% duty 3.62mhz   Ultrasound Goals  Pain   Vasopneumatic   Number Minutes Vasopneumatic  15 minutes   Vasopnuematic Location  Knee   Vasopneumatic Pressure Medium   Vasopneumatic Temperature  3*                  PT Short Term Goals - 04/15/15 1356    PT SHORT TERM GOAL #1   Title I with initial HEP (04/05/15)   Time 2   Period Weeks   Status Achieved   PT SHORT TERM GOAL #2   Title decreased edema in R knee to within .5 cm of L (04/19/15)   Time 4   Period Weeks   Status On-going   PT SHORT TERM GOAL #3   Title decreased pain in R knee with amb to 5/10 or less (04/19/15)   Baseline better in the morning but 6-7/10 in the afternoon walking   Time 4   Period Weeks   Status On-going           PT Long Term Goals - 03/22/15 1225    PT LONG TERM GOAL #1   Title I with advanced HEP   Time 8   Period Weeks   Status New   PT LONG TERM GOAL #2   Title decreased pain in R knee with  standing and walking to 2/10 or less   Time 8   Period Weeks   Status New   PT LONG TERM GOAL #3   Title Able to climb stairs without increased pain in R knee   Time 8   Period Weeks   Status New   PT LONG TERM GOAL #4   Title improved LLE strength to 5-/5    Time 8   Period Weeks   Status New   PT LONG TERM GOAL #5   Title improved R knee ROM 2-120 degrees   Time 8   Period Weeks   Status New               Plan - 04/15/15 1402    Clinical Impression Statement Patient is significantly better today. He denies pain but reports that swelling persists as day progresses. He was able to tolerate eccentric quad strenghening without increased pain. Goals are ongoing.   Pt will benefit from skilled therapeutic intervention in order to improve on the following deficits Decreased range of motion;Pain;Decreased activity tolerance;Increased edema;Decreased strength   Rehab Potential Good   PT Frequency 2x / week   PT Duration 8 weeks   PT Treatment/Interventions ADLs/Self Care Home Management;Cryotherapy;IT trainer;Therapeutic exercise;Manual techniques;Vasopneumatic Device;Taping;Patient/family education;Passive range of motion;Neuromuscular re-education;Balance training   PT Next Visit Plan Continue quad strenghthening; ecc and concentric, hip strenghthening. LE flexibility, modalities for edema, pain.   Consulted and Agree with Plan of Care Patient        Problem List Patient Active Problem List   Diagnosis Date Noted  . Chest pain 02/23/2013  . CAD (coronary artery disease)   . Ejection fraction   . Atrial fibrillation ([place])   . Drug intolerance   . Incomplete RBBB   . Carotid bruit   . Knee pain   . Anxiety   . hyperlipidemia   . Palpitations   . BPH (benign prostatic hypertrophy) 02/06/2009  . Personal history of colonic adenomas 11/29/2006   Madelyn Flavors PT  04/15/2015, 2:22 PM  [facility] [address] [place], [place], [address] Phone: [phone]   Fax:  [phone]  Name: Jordan [name] MRN:  [id number] Date of Birth: [birth year]

## 2015-04-20 ENCOUNTER — Ambulatory Visit: Payer: Medicare Other | Admitting: Physical Therapy

## 2015-04-20 DIAGNOSIS — M25461 Effusion, right knee: Secondary | ICD-10-CM

## 2015-04-20 DIAGNOSIS — R6889 Other general symptoms and signs: Secondary | ICD-10-CM

## 2015-04-20 DIAGNOSIS — M25661 Stiffness of right knee, not elsewhere classified: Secondary | ICD-10-CM | POA: Diagnosis not present

## 2015-04-20 DIAGNOSIS — M25561 Pain in right knee: Secondary | ICD-10-CM

## 2015-04-20 NOTE — Therapy (Signed)
[facility] [place], [place], [address] Phone: [phone]   Fax:  [phone]  Physical Therapy Treatment  Patient Details  Name: Jordan [name] MRN: [id number] Date of Birth: Dec 28, 1945 No Data Recorded  Encounter Date: 04/20/2015      PT End of Session - 04/20/15 1433    Visit Number 8   Number of Visits 16   Date for PT Re-Evaluation 05/17/15   PT Start Time 7972   PT Stop Time 1537   PT Time Calculation (min) 64 min   Activity Tolerance Patient tolerated treatment well   Behavior During Therapy [facility] for tasks assessed/performed      Past Medical History  Diagnosis Date  . CAD (coronary artery disease)     Stent LAD, 2002  /  nuclear 2009, no ischemia  /    [facility] October, 2011 nuclear, no ischemia, possible slight apical scar, ejection fraction 70%  . Ejection fraction     EF 65-70%, echo, 2007 /  EF 70%, nuclear, 2011  . Atrial fibrillation     Paroxysmal  . Warfarin anticoagulation     Low CHADS , score, but patient wants to be aggressive  . Drug intolerance     Mild beta blocker intolerance  . Incomplete RBBB   . Syncope     November, 2010  . Carotid bruit     Doppler November, 2011, normal  . Knee pain     November, 201  . BPH (benign prostatic hyperplasia)   . Back pain   . Anxiety     Mild  . Dyslipidemia   . Colon polyps   . Palpitations   . IBS (irritable bowel syndrome)   . Personal history of colonic adenomas 11/29/2006    Qualifier: Diagnosis of  By: [place], [place]      Past Surgical History  Procedure Laterality Date  . Colonoscopy  multiple  . Back surgery  05/1990    L4L5S1  . Cardiac catheterization      There were no vitals filed for this visit.  Visit Diagnosis:  Right knee pain  Swelling of right knee joint  Activity intolerance      Subjective Assessment - 04/20/15 1434    Subjective Patient had more swelling since last visit. May have been elliptical. He  did 20 min this morning on his spinner (sitting).  He states he is walking better with less limping. Only time it really "gets me" is when I catch my toe on something.   Patient Stated Goals to be able to ski with his grandkids this winter.   Currently in Pain? Yes   Pain Score 6    Pain Location Knee   Pain Orientation Right   Pain Descriptors / Indicators Aching   Pain Type Chronic pain   Aggravating Factors  walking and lateral resistance to knee   Pain Relieving Factors ice   Effect of Pain on Daily Activities limited with walking and  higher level activity            [facility] PT Assessment - 04/20/15 0001    Observation/Other Assessments-Edema    Edema Circumferential  45 cm   AROM   AROM Assessment Site Knee   Right Knee Extension -2   Right Knee Flexion 125   Strength   Overall Strength Comments Rt hip ABD/ext 4+/5   Right Knee Flexion 5/5   Right Knee Extension 5/5  [facility] Adult PT Treatment/Exercise - 04/20/15 0001    Knee/Hip Exercises: Aerobic   Stationary Bike 6 minutes   Knee/Hip Exercises: Standing   Functional Squat 20 reps  with heels on foam beam; then 2 x 30/40 sec   Wall Squat 3 sets  15, 20, 35 seconds   Modalities   Modalities Ultrasound;Vasopneumatic;Electrical Stimulation   Electrical Stimulation   Electrical Stimulation Location R knee   Electrical Stimulation Action premod   Electrical Stimulation Parameters 1-_0  to tolerance x 15 min   Electrical Stimulation Goals Edema   Ultrasound   Ultrasound Location R inf patella and quad tendon   Ultrasound Parameters 1.5 W cm2 50% duty x 8 min   Ultrasound Goals Pain   Vasopneumatic   Number Minutes Vasopneumatic  15 minutes   Vasopnuematic Location  Knee   Vasopneumatic Pressure Medium   Vasopneumatic Temperature  3*                  PT Short Term Goals - 04/20/15 1453    PT SHORT TERM GOAL #1   Title I with initial HEP (04/05/15)   Time 2   Period  Weeks   Status Achieved   PT SHORT TERM GOAL #2   Title decreased edema in R knee to within .5 cm of L (04/19/15)   Time 4   Period Weeks   Status Achieved   PT SHORT TERM GOAL #3   Title decreased pain in R knee with amb to 5/10 or less (04/19/15)   Baseline better in the morning but 5-6/10 in the afternoon walking   Time 4   Period Weeks   Status On-going           PT Long Term Goals - 04/20/15 1523    PT LONG TERM GOAL #1   Title I with advanced HEP   Time 8   Period Weeks   Status On-going   PT LONG TERM GOAL #2   Title decreased pain in R knee with standing and walking to 2/10 or less   Time 8   Period Weeks   Status On-going   PT LONG TERM GOAL #3   Title Able to climb stairs without increased pain in R knee   Baseline able to climb stairs in clinic without pain but will test this weekend at beach   Time 8   Period Weeks   Status Partially Met   PT LONG TERM GOAL #4   Title improved LLE strength to 5-/5    Time 8   Period Weeks   Status Partially Met   PT LONG TERM GOAL #5   Title improved R knee ROM 2-120 degrees   Time 8   Period Weeks   Status Achieved               Plan - 04/20/15 1526    Clinical Impression Statement Patient is progressing well with goals meeting ROM, edema reduction and some strength goals. He continues to have pain in the L knee with ambulation. Pain had come down until patient reinjured the knee kneeling on it.    Pt will benefit from skilled therapeutic intervention in order to improve on the following deficits Decreased range of motion;Pain;Decreased activity tolerance;Increased edema;Decreased strength   Rehab Potential Good   PT Frequency 2x / week   PT Duration 8 weeks   PT Treatment/Interventions ADLs/Self Care Home Management;Cryotherapy;IT trainer;Therapeutic exercise;Manual techniques;Vasopneumatic Device;Taping;Patient/family education;Passive range of motion;Neuromuscular  re-education;Balance training  PT Next Visit Plan Continue quad strenghthening; ecc and concentric, hip strenghthening. LE flexibility, modalities for edema, pain.   Consulted and Agree with Plan of Care Patient        Problem List Patient Active Problem List   Diagnosis Date Noted  . Chest pain 02/23/2013  . CAD (coronary artery disease)   . Ejection fraction   . Atrial fibrillation ([place])   . Drug intolerance   . Incomplete RBBB   . Carotid bruit   . Knee pain   . Anxiety   . hyperlipidemia   . Palpitations   . BPH (benign prostatic hypertrophy) 02/06/2009  . Personal history of colonic adenomas 11/29/2006   Madelyn Flavors PT  04/20/2015, 3:35 PM  [facility] [address] [place], [place], [address] Phone: [phone]   Fax:  [phone]  Name: Jordan [name] MRN: [id number] Date of Birth: 11-24-1945

## 2015-04-21 DIAGNOSIS — M1711 Unilateral primary osteoarthritis, right knee: Secondary | ICD-10-CM | POA: Diagnosis not present

## 2015-04-21 DIAGNOSIS — M1990 Unspecified osteoarthritis, unspecified site: Secondary | ICD-10-CM | POA: Diagnosis not present

## 2015-04-22 ENCOUNTER — Ambulatory Visit: Payer: Medicare Other | Admitting: Physical Therapy

## 2015-04-22 DIAGNOSIS — M25461 Effusion, right knee: Secondary | ICD-10-CM

## 2015-04-22 DIAGNOSIS — M25561 Pain in right knee: Secondary | ICD-10-CM

## 2015-04-22 DIAGNOSIS — M25661 Stiffness of right knee, not elsewhere classified: Secondary | ICD-10-CM | POA: Diagnosis not present

## 2015-04-22 DIAGNOSIS — R6889 Other general symptoms and signs: Secondary | ICD-10-CM

## 2015-04-22 NOTE — Therapy (Signed)
[facility] [place], [place], [address] Phone: [phone]   Fax:  [phone]  Physical Therapy Treatment  Patient Details  Name: Jordan [name] MRN: [id number] Date of Birth: Jun 07, 1946 No Data Recorded  Encounter Date: 04/22/2015      PT End of Session - 04/22/15 1355    Visit Number 9   Number of Visits 16   Date for PT Re-Evaluation 05/17/15   PT Start Time 8527   PT Stop Time 1448   PT Time Calculation (min) 55 min   Activity Tolerance Patient tolerated treatment well   Behavior During Therapy [facility] for tasks assessed/performed      Past Medical History  Diagnosis Date  . CAD (coronary artery disease)     Stent LAD, 2002  /  nuclear 2009, no ischemia  /    [facility] October, 2011 nuclear, no ischemia, possible slight apical scar, ejection fraction 70%  . Ejection fraction     EF 65-70%, echo, 2007 /  EF 70%, nuclear, 2011  . Atrial fibrillation     Paroxysmal  . Warfarin anticoagulation     Low CHADS , score, but patient wants to be aggressive  . Drug intolerance     Mild beta blocker intolerance  . Incomplete RBBB   . Syncope     November, 2010  . Carotid bruit     Doppler November, 2011, normal  . Knee pain     November, 201  . BPH (benign prostatic hyperplasia)   . Back pain   . Anxiety     Mild  . Dyslipidemia   . Colon polyps   . Palpitations   . IBS (irritable bowel syndrome)   . Personal history of colonic adenomas 11/29/2006    Qualifier: Diagnosis of  By: [place], [place]      Past Surgical History  Procedure Laterality Date  . Colonoscopy  multiple  . Back surgery  05/1990    L4L5S1  . Cardiac catheterization      There were no vitals filed for this visit.  Visit Diagnosis:  Right knee pain  Swelling of right knee joint  Activity intolerance      Subjective Assessment - 04/22/15 1356    Subjective Saw MD who told him to continue two more weeks.    Patient Stated  Goals to be able to ski with his grandkids this winter.   Currently in Pain? Yes   Pain Score 4    Pain Location Knee   Pain Orientation Right   Pain Descriptors / Indicators Aching                         [facility] Adult PT Treatment/Exercise - 04/22/15 0001    Knee/Hip Exercises: Aerobic   Stationary Bike L 3 x 6 minutes   Knee/Hip Exercises: Standing   Functional Squat 20 reps  with heels on foam beam; deeper range   Wall Squat 3 sets  15, 40, 40 seconds   SLS 4 inch  2 x 10   Modalities   Modalities Ultrasound;Vasopneumatic;Electrical Stimulation   Electrical Stimulation   Electrical Stimulation Location R knee   Electrical Stimulation Action IFC   Electrical Stimulation Parameters 1-10 Hz to tolerance x 15 min   Electrical Stimulation Goals Edema   Ultrasound   Ultrasound Location R patellar tendon and quad tendon   Ultrasound Parameters 1.5 Wcm2 1 mhz 50% x 8   Ultrasound Goals Pain  Vasopneumatic   Number Minutes Vasopneumatic  15 minutes   Vasopnuematic Location  Knee   Vasopneumatic Pressure Medium   Vasopneumatic Temperature  3*                  PT Short Term Goals - 04/20/15 1453    PT SHORT TERM GOAL #1   Title I with initial HEP (04/05/15)   Time 2   Period Weeks   Status Achieved   PT SHORT TERM GOAL #2   Title decreased edema in R knee to within .5 cm of L (04/19/15)   Time 4   Period Weeks   Status Achieved   PT SHORT TERM GOAL #3   Title decreased pain in R knee with amb to 5/10 or less (04/19/15)   Baseline better in the morning but 5-6/10 in the afternoon walking   Time 4   Period Weeks   Status On-going           PT Long Term Goals - 04/20/15 1523    PT LONG TERM GOAL #1   Title I with advanced HEP   Time 8   Period Weeks   Status On-going   PT LONG TERM GOAL #2   Title decreased pain in R knee with standing and walking to 2/10 or less   Time 8   Period Weeks   Status On-going   PT LONG TERM GOAL #3    Title Able to climb stairs without increased pain in R knee   Baseline able to climb stairs in clinic without pain but will test this weekend at beach   Time 8   Period Weeks   Status Partially Met   PT LONG TERM GOAL #4   Title improved LLE strength to 5-/5    Time 8   Period Weeks   Status Partially Met   PT LONG TERM GOAL #5   Title improved R knee ROM 2-120 degrees   Time 8   Period Weeks   Status Achieved               Plan - 04/22/15 1524    Clinical Impression Statement Patient continues to do well with functional quad strenghening and reports no increased pain.    PT Next Visit Plan FOTO; Continue quad strenghthening; ecc and concentric, hip strenghthening. LE flexibility, modalities for edema, pain. Continue 4 more visits.        Problem List Patient Active Problem List   Diagnosis Date Noted  . Chest pain 02/23/2013  . CAD (coronary artery disease)   . Ejection fraction   . Atrial fibrillation ([place])   . Drug intolerance   . Incomplete RBBB   . Carotid bruit   . Knee pain   . Anxiety   . hyperlipidemia   . Palpitations   . BPH (benign prostatic hypertrophy) 02/06/2009  . Personal history of colonic adenomas 11/29/2006   Madelyn Flavors PT  04/22/2015, 3:27 PM  [facility] [address] [place], [place], [address] Phone: [phone]   Fax:  [phone]  Name: Jordan [name] MRN: [id number] Date of Birth: [birth year]

## 2015-04-26 ENCOUNTER — Ambulatory Visit: Payer: Medicare Other | Admitting: Physical Therapy

## 2015-04-26 DIAGNOSIS — M25461 Effusion, right knee: Secondary | ICD-10-CM | POA: Diagnosis not present

## 2015-04-26 DIAGNOSIS — M25561 Pain in right knee: Secondary | ICD-10-CM | POA: Diagnosis not present

## 2015-04-26 DIAGNOSIS — R6889 Other general symptoms and signs: Secondary | ICD-10-CM | POA: Diagnosis not present

## 2015-04-26 DIAGNOSIS — M25661 Stiffness of right knee, not elsewhere classified: Secondary | ICD-10-CM | POA: Diagnosis not present

## 2015-04-26 NOTE — Therapy (Signed)
[facility] [place], [place], [address] Phone: [phone]   Fax:  [phone]  Physical Therapy Treatment  Patient Details  Name: Jordan [name] MRN: [id number] Date of Birth: March 31, 1946 No Data Recorded  Encounter Date: 04/26/2015      PT End of Session - 04/26/15 1439    Visit Number 10   Number of Visits 16   Date for PT Re-Evaluation 05/17/15   PT Start Time 6073   PT Stop Time 1539   PT Time Calculation (min) 70 min   Activity Tolerance Patient tolerated treatment well   Behavior During Therapy [facility] for tasks assessed/performed      Past Medical History  Diagnosis Date  . CAD (coronary artery disease)     Stent LAD, 2002  /  nuclear 2009, no ischemia  /    [facility] October, 2011 nuclear, no ischemia, possible slight apical scar, ejection fraction 70%  . Ejection fraction     EF 65-70%, echo, 2007 /  EF 70%, nuclear, 2011  . Atrial fibrillation     Paroxysmal  . Warfarin anticoagulation     Low CHADS , score, but patient wants to be aggressive  . Drug intolerance     Mild beta blocker intolerance  . Incomplete RBBB   . Syncope     November, 2010  . Carotid bruit     Doppler November, 2011, normal  . Knee pain     November, 201  . BPH (benign prostatic hyperplasia)   . Back pain   . Anxiety     Mild  . Dyslipidemia   . Colon polyps   . Palpitations   . IBS (irritable bowel syndrome)   . Personal history of colonic adenomas 11/29/2006    Qualifier: Diagnosis of  By: [place], [place]      Past Surgical History  Procedure Laterality Date  . Colonoscopy  multiple  . Back surgery  05/1990    L4L5S1  . Cardiac catheterization      There were no vitals filed for this visit.  Visit Diagnosis:  Right knee pain  Swelling of right knee joint  Activity intolerance      Subjective Assessment - 04/26/15 1439    Subjective Patient went to beach this weekend and was able to climb stairs and  ride bike without significant pain, but continued swelling.   Patient Stated Goals to be able to ski with his grandkids this winter.   Currently in Pain? No/denies  just stiffness            [facility] PT Assessment - 04/26/15 0001    Assessment   Medical Diagnosis Rt Knee pain   Onset Date/Surgical Date 01/20/15   Observation/Other Assessments   Focus on Therapeutic Outcomes (FOTO)  49% limited                     [facility] Adult PT Treatment/Exercise - 04/26/15 0001    Knee/Hip Exercises: Aerobic   Stationary Bike --   Knee/Hip Exercises: Standing   Functional Squat 20 reps;5 reps  with heels on foam beam; deeper range   Wall Squat 3 sets  25, 32, 45   SLS 4 in 3x10;    SLS with Vectors with red TB shoulder rotation x 15   Rebounder 2x15   Modalities   Modalities Ultrasound;Vasopneumatic;Electrical Stimulation   Electrical Stimulation   Electrical Stimulation Location R knee   Electrical Stimulation Action IFC   Electrical  Stimulation Parameters 1-10 HZ to tolerance x 15 min   Electrical Stimulation Goals Edema   Ultrasound   Ultrasound Location R patella and quad tendons   Ultrasound Parameters 1.5wcm2 3.70mz 50% duty x 8 min   Ultrasound Goals Pain   Vasopneumatic   Number Minutes Vasopneumatic  15 minutes   Vasopnuematic Location  Knee   Vasopneumatic Pressure Medium   Vasopneumatic Temperature  3*                  PT Short Term Goals - 04/20/15 1453    PT SHORT TERM GOAL #1   Title I with initial HEP (04/05/15)   Time 2   Period Weeks   Status Achieved   PT SHORT TERM GOAL #2   Title decreased edema in R knee to within .5 cm of L (04/19/15)   Time 4   Period Weeks   Status Achieved   PT SHORT TERM GOAL #3   Title decreased pain in R knee with amb to 5/10 or less (04/19/15)   Baseline better in the morning but 5-6/10 in the afternoon walking   Time 4   Period Weeks   Status On-going           PT Long Term Goals - [id number]     PT LONG TERM GOAL #3   Title Able to climb stairs without increased pain in R knee   Time 8   Period Weeks   Status Achieved               Plan - 111/15/20161528    Clinical Impression Statement Patient came 30 min early so warmed up on bike before therapy. Patient continues to improve. LTG # 3 was met (stairs). He continues to have some tenderness with palpation at patellar and quad tendons and he has a slight Trendelenberg gait on the L.    Pt will benefit from skilled therapeutic intervention in order to improve on the following deficits Decreased range of motion;Pain;Decreased activity tolerance;Increased edema;Decreased strength   Rehab Potential Good   PT Frequency 2x / week   PT Duration 8 weeks   PT Treatment/Interventions ADLs/Self Care Home Management;Cryotherapy;EIT trainerTherapeutic exercise;Manual techniques;Vasopneumatic Device;Taping;Patient/family education;Passive range of motion;Neuromuscular re-education;Balance training   PT Next Visit Plan Continue quad strenghthening; ecc and concentric, SLS/balance for hip strenghthening. LE flexibility, modalities for edema, pain. Continue 3 more visits.   Consulted and Agree with Plan of Care Patient          G-Codes - 1November 15, 20161536    Functional Assessment Tool Used FOTO 49% LIMITED   Functional Limitation Mobility: Walking and moving around   Mobility: Walking and Moving Around Current Status [phone] At least 40 percent but less than 60 percent impaired, limited or restricted   Mobility: Walking and Moving Around Goal Status [phone] At least 40 percent but less than 60 percent impaired, limited or restricted      Problem List Patient Active Problem List   Diagnosis Date Noted  . Chest pain 02/23/2013  . CAD (coronary artery disease)   . Ejection fraction   . [place]rial [place]   . Drug intolerance   . Incomplete RBBB   . Carotid bruit   . Knee pain   . Anxiety    . hyperlipidemia   . Palpitations   . BPH (benign prostatic hypertrophy) 02/06/2009  . Personal history of colonic adenomas 11/29/2006    JMadelyn FlavorsPT  111/15/2016 3:39 PM  [facility] [place], [place], [address] Phone: [phone]   Fax:  [phone]  Name: TARRELL DEBES MRN: [id number] Date of Birth: 1945/11/30

## 2015-04-29 ENCOUNTER — Ambulatory Visit: Payer: Medicare Other | Admitting: Physical Therapy

## 2015-04-29 DIAGNOSIS — R6889 Other general symptoms and signs: Secondary | ICD-10-CM | POA: Diagnosis not present

## 2015-04-29 DIAGNOSIS — M25461 Effusion, right knee: Secondary | ICD-10-CM | POA: Diagnosis not present

## 2015-04-29 DIAGNOSIS — M25661 Stiffness of right knee, not elsewhere classified: Secondary | ICD-10-CM | POA: Diagnosis not present

## 2015-04-29 DIAGNOSIS — M25561 Pain in right knee: Secondary | ICD-10-CM | POA: Diagnosis not present

## 2015-04-29 NOTE — Therapy (Signed)
[facility] [place], [place], [address] Phone: [phone]   Fax:  [phone]  Physical Therapy Treatment  Patient Details  Name: Jordan [name] MRN: [id number] Date of Birth: Dec 10, 1945 No Data Recorded  Encounter Date: 04/29/2015      PT End of Session - 04/29/15 1433    Visit Number 11   Number of Visits 16   Date for PT Re-Evaluation 05/17/15   PT Start Time [id number]   PT Stop Time 1526   PT Time Calculation (min) 55 min   Activity Tolerance Patient tolerated treatment well   Behavior During Therapy [facility] for tasks assessed/performed      Past Medical History  Diagnosis Date  . CAD (coronary artery disease)     Stent LAD, 2002  /  nuclear 2009, no ischemia  /    [facility] October, 2011 nuclear, no ischemia, possible slight apical scar, ejection fraction 70%  . Ejection fraction     EF 65-70%, echo, 2007 /  EF 70%, nuclear, 2011  . Atrial fibrillation     Paroxysmal  . Warfarin anticoagulation     Low CHADS , score, but patient wants to be aggressive  . Drug intolerance     Mild beta blocker intolerance  . Incomplete RBBB   . Syncope     November, 2010  . Carotid bruit     Doppler November, 2011, normal  . Knee pain     November, 201  . BPH (benign prostatic hyperplasia)   . Back pain   . Anxiety     Mild  . Dyslipidemia   . Colon polyps   . Palpitations   . IBS (irritable bowel syndrome)   . Personal history of colonic adenomas 11/29/2006    Qualifier: Diagnosis of  By: [place], [place]      Past Surgical History  Procedure Laterality Date  . Colonoscopy  multiple  . Back surgery  05/1990    [id number]  . Cardiac catheterization      There were no vitals filed for this visit.  Visit Diagnosis:  Swelling of right knee joint  Activity intolerance      Subjective Assessment - 04/29/15 1434    Patient Stated Goals to be able to ski with his grandkids this winter.   Currently in Pain? Yes   Pain Location Knee   Pain Orientation Right   Pain Descriptors / Indicators Aching   Pain Type Chronic pain                         [facility] Adult PT Treatment/Exercise - 04/29/15 0001    Knee/Hip Exercises: Aerobic   Stationary Bike L 3 x 10 min   Knee/Hip Exercises: Machines for Strengthening   Cybex Knee Extension 20# B up, R eccentric lowering x 15  then 10# R only x 10   Cybex Knee Flexion 30# x 20   Knee/Hip Exercises: Standing   SLS 4 in 1x10; 6 in 2x10 heel taps   SLS with Vectors with red TB shoulder rotation x 20   Rebounder 3x15   Knee/Hip Exercises: Supine   Short Arc Quad Sets Right;Strengthening;10 reps;2 sets   Modalities   Modalities Vasopneumatic;Electrical Sales promotion account executive Location R knee   Electrical Stimulation Action IFC   Electrical Stimulation Parameters 1-10 Hz to tolerance x 15 min   Electrical Stimulation Goals Edema   Ultrasound   Ultrasound  Location --   Ultrasound Parameters --   Ultrasound Goals --   Vasopneumatic   Number Minutes Vasopneumatic  15 minutes   Vasopnuematic Location  Knee   Vasopneumatic Pressure Medium   Vasopneumatic Temperature  3*                  PT Short Term Goals - 04/20/15 1453    PT SHORT TERM GOAL #1   Title I with initial HEP (04/05/15)   Time 2   Period Weeks   Status Achieved   PT SHORT TERM GOAL #2   Title decreased edema in R knee to within .5 cm of L (04/19/15)   Time 4   Period Weeks   Status Achieved   PT SHORT TERM GOAL #3   Title decreased pain in R knee with amb to 5/10 or less (04/19/15)   Baseline better in the morning but 5-6/10 in the afternoon walking   Time 4   Period Weeks   Status On-going           PT Long Term Goals - 04/26/15 1528    PT LONG TERM GOAL #3   Title Able to climb stairs without increased pain in R knee   Time 8   Period Weeks   Status Achieved               Plan - 04/29/15 1512     Clinical Impression Statement Patient did well today with knee extension machine but lacks end range R  quad strength vs. L. No pain with palpation of patellar or quad tendon so held [place]. Patient walked for 1/2 mile since last visit with no pain in R knee.   PT Next Visit Plan Continue quad strenghthening; ecc and concentric, SLS/balance for hip strenghthening. LE flexibility, modalities for edema, pain. Continue 5 more visits (count was off for last two notes!)        Problem List Patient Active Problem List   Diagnosis Date Noted  . Chest pain 02/23/2013  . CAD (coronary artery disease)   . Ejection fraction   . Atrial fibrillation ([place])   . Drug intolerance   . Incomplete RBBB   . Carotid bruit   . Knee pain   . Anxiety   . hyperlipidemia   . Palpitations   . BPH (benign prostatic hypertrophy) 02/06/2009  . Personal history of colonic adenomas 11/29/2006   Madelyn Flavors PT  04/29/2015, 3:24 PM  [facility] [address] [place], [place], [address] Phone: [phone]   Fax:  [phone]  Name: Jordan [name] MRN: [id number] Date of Birth: Aug 30, 1945

## 2015-05-04 ENCOUNTER — Encounter: Payer: Self-pay | Admitting: Physical Therapy

## 2015-05-10 ENCOUNTER — Ambulatory Visit: Payer: Medicare Other | Admitting: Physical Therapy

## 2015-05-10 DIAGNOSIS — M25461 Effusion, right knee: Secondary | ICD-10-CM | POA: Diagnosis not present

## 2015-05-10 DIAGNOSIS — R6889 Other general symptoms and signs: Secondary | ICD-10-CM

## 2015-05-10 DIAGNOSIS — M25561 Pain in right knee: Secondary | ICD-10-CM

## 2015-05-10 DIAGNOSIS — M25661 Stiffness of right knee, not elsewhere classified: Secondary | ICD-10-CM

## 2015-05-10 NOTE — Therapy (Signed)
[facility] [place], [place], [address] Phone: [phone]   Fax:  [phone]  Physical Therapy Treatment  Patient Details  Name: Jordan [name] MRN: [id number] Date of Birth: 1946-02-12 No Data Recorded  Encounter Date: 05/10/2015      PT End of Session - 05/10/15 1439    Visit Number 12   Number of Visits 16   Date for PT Re-Evaluation 05/17/15   PT Start Time [id number]   PT Stop Time 1539   PT Time Calculation (min) 68 min   Activity Tolerance Patient tolerated treatment well      Past Medical History  Diagnosis Date  . CAD (coronary artery disease)     Stent LAD, 2002  /  nuclear 2009, no ischemia  /    [facility] October, 2011 nuclear, no ischemia, possible slight apical scar, ejection fraction 70%  . Ejection fraction     EF 65-70%, echo, 2007 /  EF 70%, nuclear, 2011  . Atrial fibrillation     Paroxysmal  . Warfarin anticoagulation     Low CHADS , score, but patient wants to be aggressive  . Drug intolerance     Mild beta blocker intolerance  . Incomplete RBBB   . Syncope     November, 2010  . Carotid bruit     Doppler November, 2011, normal  . Knee pain     November, 201  . BPH (benign prostatic hyperplasia)   . Back pain   . Anxiety     Mild  . Dyslipidemia   . Colon polyps   . Palpitations   . IBS (irritable bowel syndrome)   . Personal history of colonic adenomas 11/29/2006    Qualifier: Diagnosis of  By: [place], [place]      Past Surgical History  Procedure Laterality Date  . Colonoscopy  multiple  . Back surgery  05/1990    L4L5S1  . Cardiac catheterization      There were no vitals filed for this visit.  Visit Diagnosis:  Swelling of right knee joint  Activity intolerance  Right knee pain  Stiffness of right knee      Subjective Assessment - 05/10/15 1439    Subjective Patient reports he hasn't had much pain, but he's discouraged because the knee keeps swelling after  he's been up on it a while for example decorating his yard this weekend. Does not swell after 35-40 min on spiinner.   Patient Stated Goals to be able to ski with his grandkids this winter.   Currently in Pain? No/denies                         [facility] Adult PT Treatment/Exercise - 05/10/15 0001    Knee/Hip Exercises: Aerobic   Stationary Bike L 3 x 14 min   Knee/Hip Exercises: Machines for Strengthening   Cybex Knee Extension 10# R only 1x 13, 1x20   Cybex Knee Flexion 40# R only 1x15, 1x20   Knee/Hip Exercises: Standing   SLS 4 in 1x10; 6 in 2x10  heel taps   Rebounder 1 x15 on level, 1x5 foam, then SLS on foam multiple trials   Modalities   Modalities Vasopneumatic;Electrical Stimulation   Electrical Stimulation   Electrical Stimulation Location R knee   Electrical Stimulation Action IFC   Electrical Stimulation Parameters 1-10 Hz x 15 min to tolerance   Electrical Stimulation Goals Edema   Ultrasound   Ultrasound Location R  quad tendon   Ultrasound Parameters 1.5 wcm2 3.3 mhz 50% duty x 8 min   Ultrasound Goals Pain   Vasopneumatic   Number Minutes Vasopneumatic  15 minutes   Vasopnuematic Location  Knee   Vasopneumatic Pressure Medium   Vasopneumatic Temperature  3*                  PT Short Term Goals - 04/20/15 1453    PT SHORT TERM GOAL #1   Title I with initial HEP (04/05/15)   Time 2   Period Weeks   Status Achieved   PT SHORT TERM GOAL #2   Title decreased edema in R knee to within .5 cm of L (04/19/15)   Time 4   Period Weeks   Status Achieved   PT SHORT TERM GOAL #3   Title decreased pain in R knee with amb to 5/10 or less (04/19/15)   Baseline better in the morning but 5-6/10 in the afternoon walking   Time 4   Period Weeks   Status On-going           PT Long Term Goals - 04/26/15 1528    PT LONG TERM GOAL #3   Title Able to climb stairs without increased pain in R knee   Time 8   Period Weeks   Status Achieved                Plan - 05/10/15 1529    Clinical Impression Statement Patient reported increased swelling in lateral R knee over the weekend. Swelling occurs with weightbearing activities. He does not have swelling after spinning 30 min or greater. He denies pain in patellar tendon but continues to have some pain and stiffness in quad tendon.    PT Next Visit Plan Continue quad strenghthening; ecc and concentric, SLS/balance for hip strenghthening. LE flexibility, modalities for edema, pain.          Problem List Patient Active Problem List   Diagnosis Date Noted  . Chest pain 02/23/2013  . CAD (coronary artery disease)   . Ejection fraction   . Atrial fibrillation ([place])   . Drug intolerance   . Incomplete RBBB   . Carotid bruit   . Knee pain   . Anxiety   . hyperlipidemia   . Palpitations   . BPH (benign prostatic hypertrophy) 02/06/2009  . Personal history of colonic adenomas 11/29/2006    Madelyn Flavors PT  05/10/2015, 3:34 PM  [facility] [address] [place], [place], [address] Phone: [phone]   Fax:  [phone]  Name: CHAEL DEVILLIER MRN: [id number] Date of Birth: 06-Oct-1945

## 2015-05-13 ENCOUNTER — Ambulatory Visit: Payer: Medicare Other | Attending: Sports Medicine | Admitting: Physical Therapy

## 2015-05-13 DIAGNOSIS — M25661 Stiffness of right knee, not elsewhere classified: Secondary | ICD-10-CM | POA: Diagnosis not present

## 2015-05-13 DIAGNOSIS — R6889 Other general symptoms and signs: Secondary | ICD-10-CM | POA: Insufficient documentation

## 2015-05-13 DIAGNOSIS — M25461 Effusion, right knee: Secondary | ICD-10-CM | POA: Diagnosis not present

## 2015-05-13 NOTE — Therapy (Signed)
[facility] [place], [place], [address] Phone: [phone]   Fax:  [phone]  Physical Therapy Treatment  Patient Details  Name: Jordan [name] MRN: [id number] Date of Birth: Jan 03, 1946 No Data Recorded  Encounter Date: 05/13/2015      PT End of Session - 05/13/15 1444    Visit Number 13   Number of Visits 16   Date for PT Re-Evaluation 05/17/15   PT Start Time 1435   PT Stop Time 1539   PT Time Calculation (min) 64 min   Activity Tolerance Patient tolerated treatment well   Behavior During Therapy [facility] [place] for tasks assessed/performed      Past Medical History  Diagnosis Date  . CAD (coronary artery disease)     Stent LAD, 2002  /  nuclear 2009, no ischemia  /    [facility] October, 2011 nuclear, no ischemia, possible slight apical scar, ejection fraction 70%  . Ejection fraction     EF 65-70%, echo, 2007 /  EF 70%, nuclear, 2011  . Atrial fibrillation     Paroxysmal  . Warfarin anticoagulation     Low CHADS , score, but patient wants to be aggressive  . Drug intolerance     Mild beta blocker intolerance  . Incomplete RBBB   . Syncope     November, 2010  . Carotid bruit     Doppler November, 2011, normal  . Knee pain     November, 201  . BPH (benign prostatic hyperplasia)   . Back pain   . Anxiety     Mild  . Dyslipidemia   . Colon polyps   . Palpitations   . IBS (irritable bowel syndrome)   . Personal history of colonic adenomas 11/29/2006    Qualifier: Diagnosis of  By: [place], [place]      Past Surgical History  Procedure Laterality Date  . Colonoscopy  multiple  . Back surgery  05/1990    L4L5S1  . Cardiac catheterization      There were no vitals filed for this visit.  Visit Diagnosis:  Swelling of right knee joint  Activity intolerance  Stiffness of right knee      Subjective Assessment - 05/13/15 1555    Subjective No new complaints today. He states the knee feels better.   Patient is accompained by: Family member   Patient Stated Goals to be able to ski with his grandkids this winter.   Currently in Pain? No/denies                         [facility] Adult PT Treatment/Exercise - 05/13/15 1524    Knee/Hip Exercises: Aerobic   Stationary Bike L 3 x 11 min   Knee/Hip Exercises: Machines for Strengthening   Cybex Knee Extension 10# R only 1x 13, 1x20   Cybex Knee Flexion 40# R only 1x15, 1x20   Knee/Hip Exercises: Standing   SLS 6 inch x 20, 1 x 10  cues to keep buttocks backward    SLS with Vectors SLS on foam with theraband shoulder D2 extension x 20   Rebounder 1x15 on foam SLS on R   Modalities   Modalities Vasopneumatic;Electrical Stimulation;Ultrasound   Acupuncturist Location R knee   Electrical Stimulation Goals Edema   Ultrasound   Ultrasound Goals Pain   Vasopneumatic   Number Minutes Vasopneumatic  15 minutes   Vasopnuematic Location  Knee   Vasopneumatic  Pressure Medium   Vasopneumatic Temperature  3*                  PT Short Term Goals - 04/20/15 1453    PT SHORT TERM GOAL #1   Title I with initial HEP (04/05/15)   Time 2   Period Weeks   Status Achieved   PT SHORT TERM GOAL #2   Title decreased edema in R knee to within .5 cm of L (04/19/15)   Time 4   Period Weeks   Status Achieved   PT SHORT TERM GOAL #3   Title decreased pain in R knee with amb to 5/10 or less (04/19/15)   Baseline better in the morning but 5-6/10 in the afternoon walking   Time 4   Period Weeks   Status On-going           PT Long Term Goals - 05/13/15 1603    PT LONG TERM GOAL #1   Title I with advanced HEP   Time 8   Period Weeks   Status Achieved   PT LONG TERM GOAL #2   Title decreased pain in R knee with standing and walking to 2/10 or less   Time 8   Period Weeks   Status On-going   PT LONG TERM GOAL #3   Title Able to climb stairs without increased pain in R knee   Time 8    Period Weeks   Status Achieved   PT LONG TERM GOAL #4   Title improved LLE strength to 5-/5    Period Weeks   Status Achieved   PT LONG TERM GOAL #5   Title improved R knee ROM 2-120 degrees   Time 8   Period Weeks   Status Achieved               Plan - 05/13/15 1605    Clinical Impression Statement Patient states he is doing better today. He tolerated increased intensity balance activities without any pain. His strength goal is met. Patient reports knee always feels looser after therapy.   PT Next Visit Plan Continue quad strenghthening; ecc and concentric, SLS/balance for hip strenghthening. LE flexibility, modalities for edema, pain.  Two more visits.        Problem List Patient Active Problem List   Diagnosis Date Noted  . Chest pain 02/23/2013  . CAD (coronary artery disease)   . Ejection fraction   . Atrial fibrillation ([place])   . Drug intolerance   . Incomplete RBBB   . Carotid bruit   . Knee pain   . Anxiety   . hyperlipidemia   . Palpitations   . BPH (benign prostatic hypertrophy) 02/06/2009  . Personal history of colonic adenomas 11/29/2006    Madelyn Flavors PT  05/13/2015, 4:07 PM  [facility] [address] [place], [place], [address] Phone: [phone]   Fax:  [phone]  Name: Jordan [name] MRN: [id number] Date of Birth: 1946/02/19

## 2015-05-15 IMAGING — US US EXTREM LOW VENOUS*R*
1 series · 14 of 24 positions shown · non-contrast
Comparison: None.

CLINICAL DATA: Right ankle pain and swelling

EXAM:
RIGHT LOWER EXTREMITY VENOUS ULTRASOUND
TECHNIQUE: Gray-scale sonography with graded compression, as well as color
Doppler and duplex ultrasound, were performed to evaluate the deep
venous system from the level of the common femoral vein through the
popliteal and proximal calf veins. Spectral Doppler was utilized to
evaluate flow at rest and with distal augmentation maneuvers.

[Series 1: us extrem low venous*right* · 0.08mm/px · 14 of 28 slices shown]
[im 1/28]
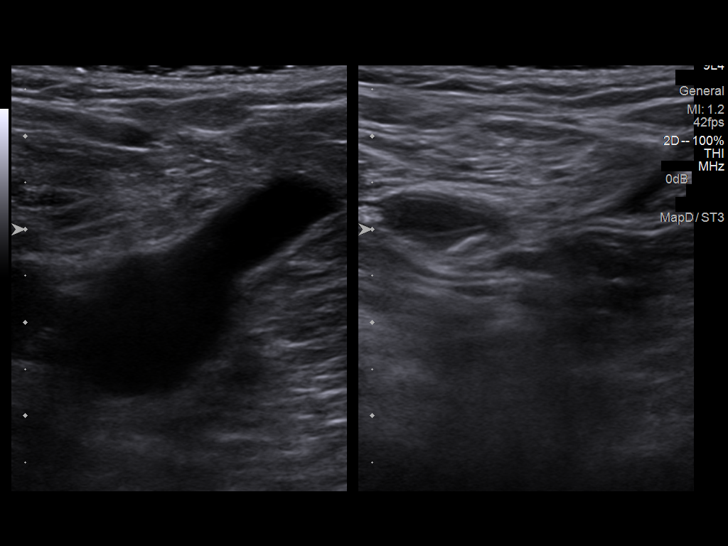
[im 3/28]
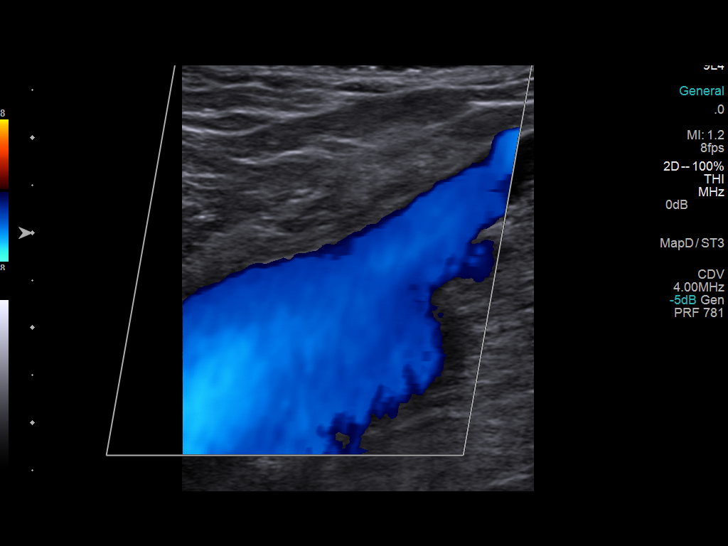
[im 5/28]
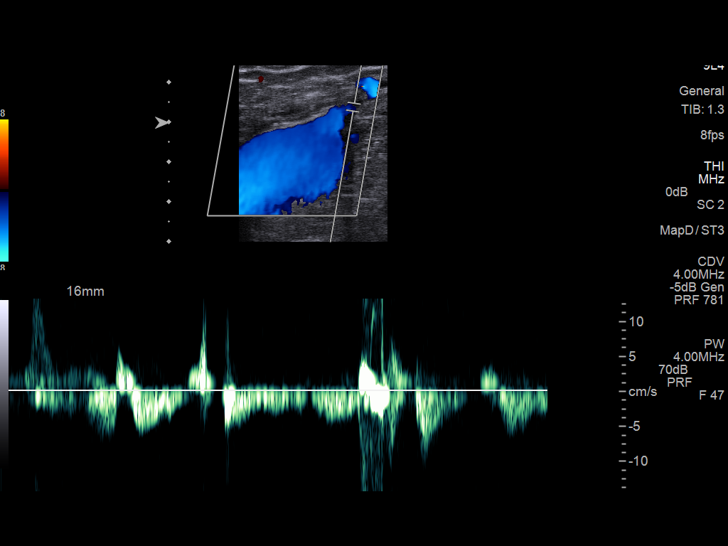
[im 8/28]
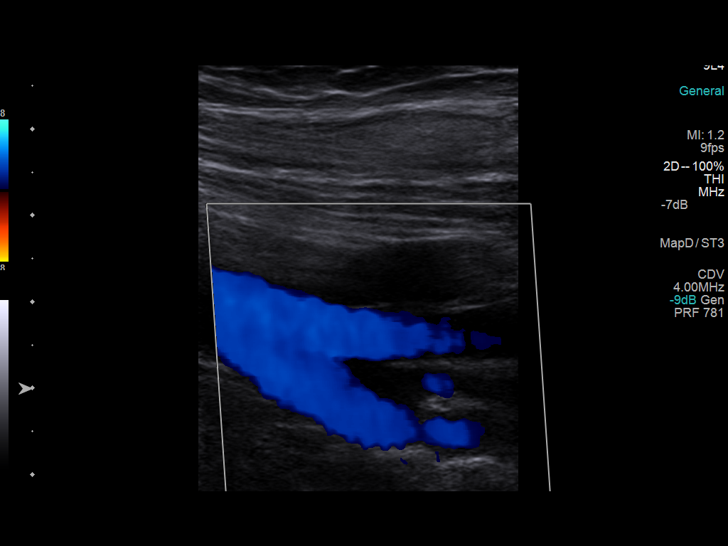
[im 9/28]
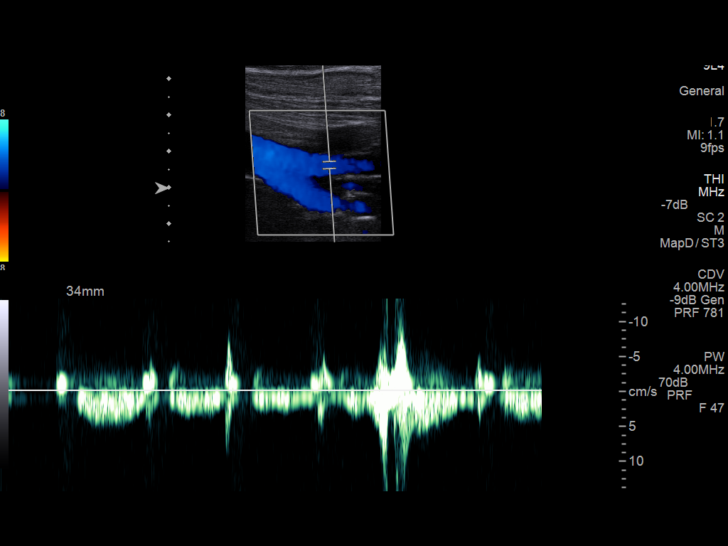
[im 11/28]
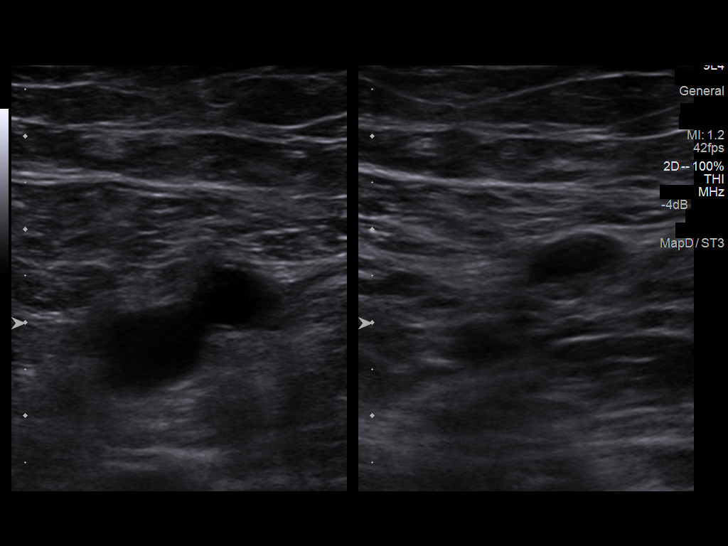
[im 13/28]
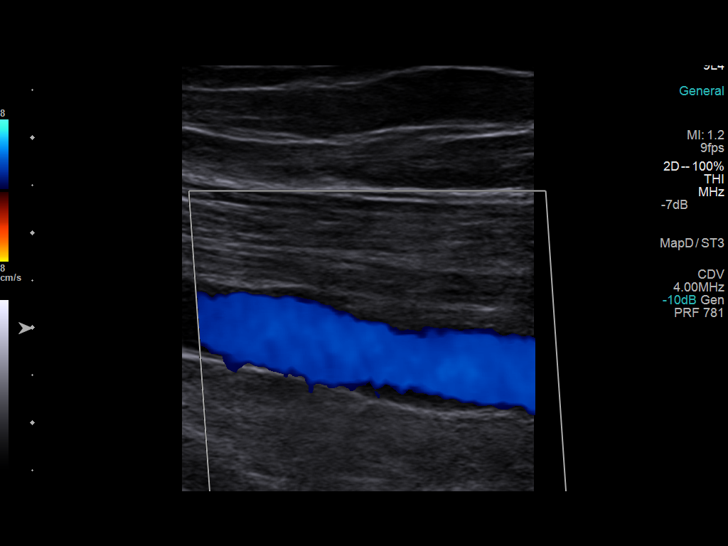
[im 15/28]
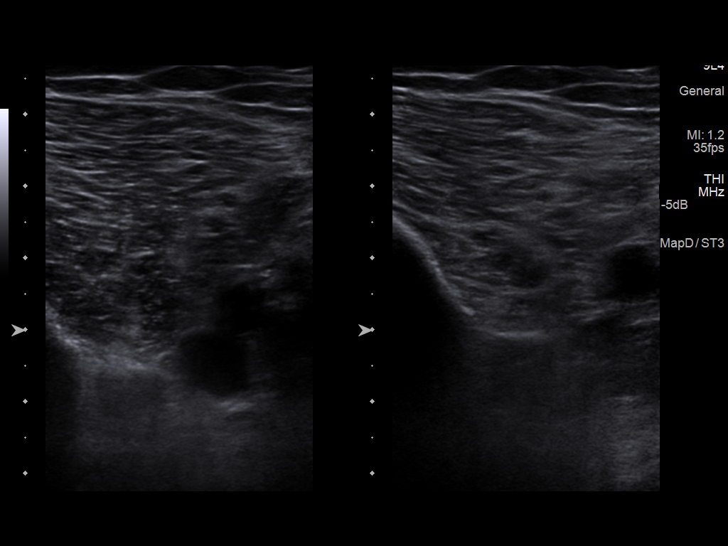
[im 17/28]
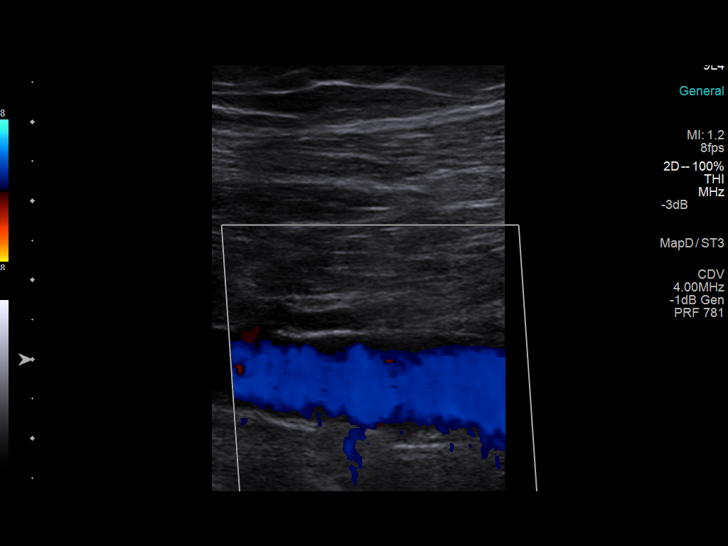
[im 19/28]
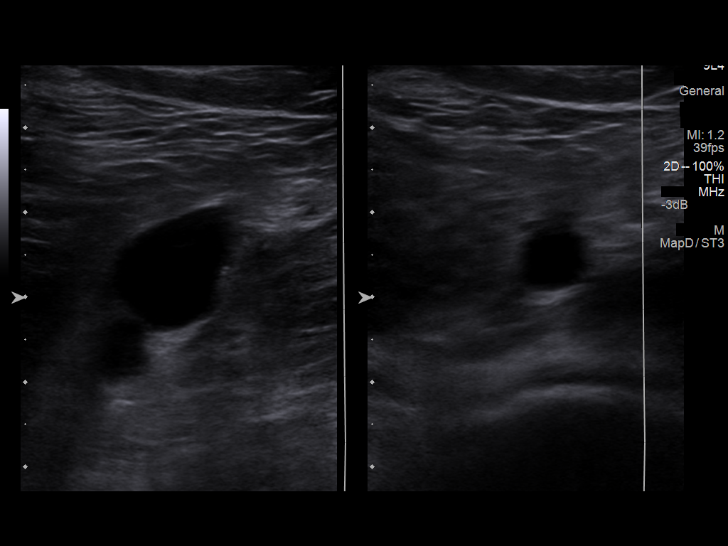
[im 22/28]
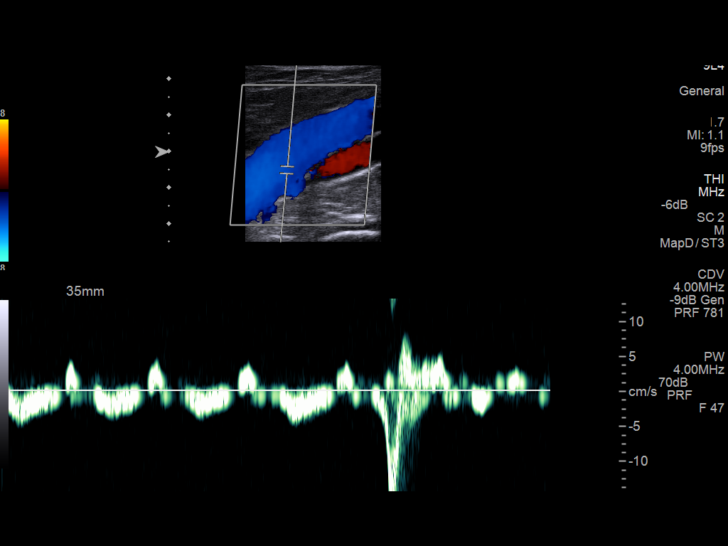
[im 23/28]
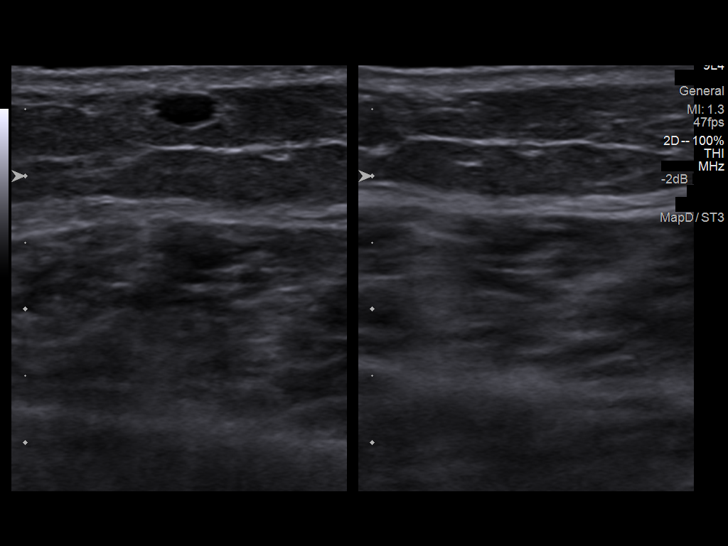
[im 25/28]
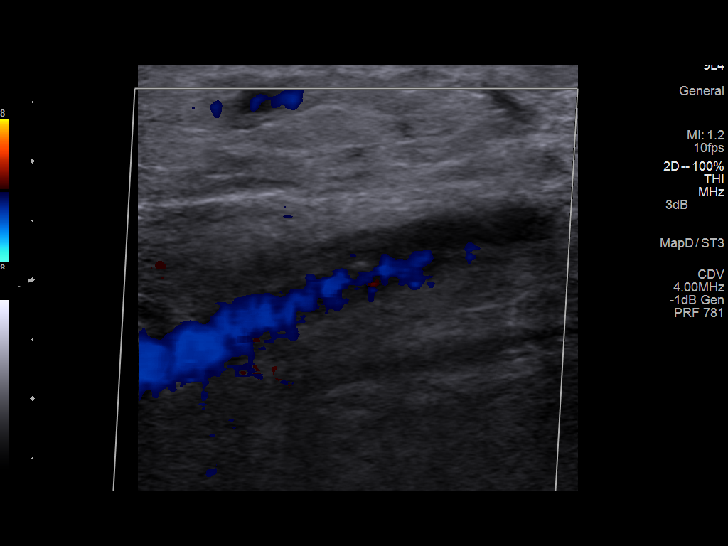
[im 28/28]
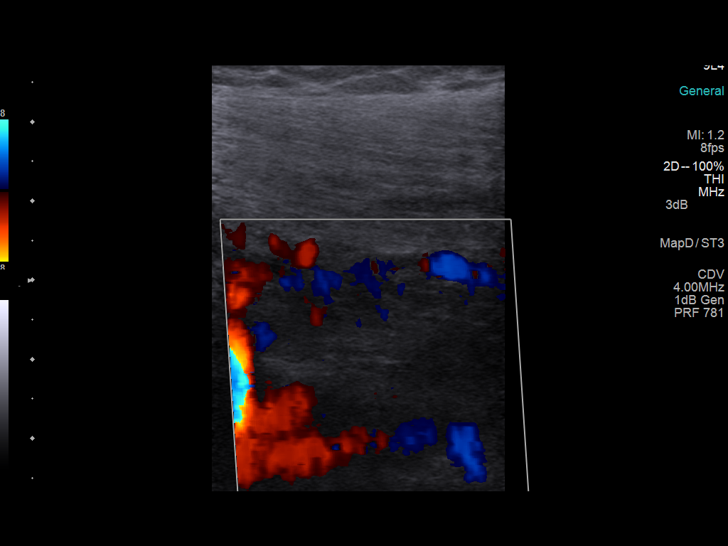

[14 of 24 positions shown; findings below may reference images not displayed]

FINDINGS: Thrombus within deep veins:  None visualized.

Compressibility of deep veins:  Normal.

Duplex waveform respiratory phasicity:  Normal.

Duplex waveform response to augmentation:  Normal.

Venous reflux: None visualized. Axial great saphenous vein is patent
and compressible. There is mild superficial subcutaneous edema at
the level of the ankle.

Other findings:  None visualized.
IMPRESSION: Negative for DVT.

## 2015-05-15 IMAGING — CR DG ANKLE COMPLETE 3+V*R*
3 series · 3 of 3 positions shown · non-contrast
Comparison: August 23, 2007

CLINICAL DATA: Pain

EXAM:
RIGHT ANKLE - COMPLETE 3+ VIEW

[view not recorded (1 of 3)]
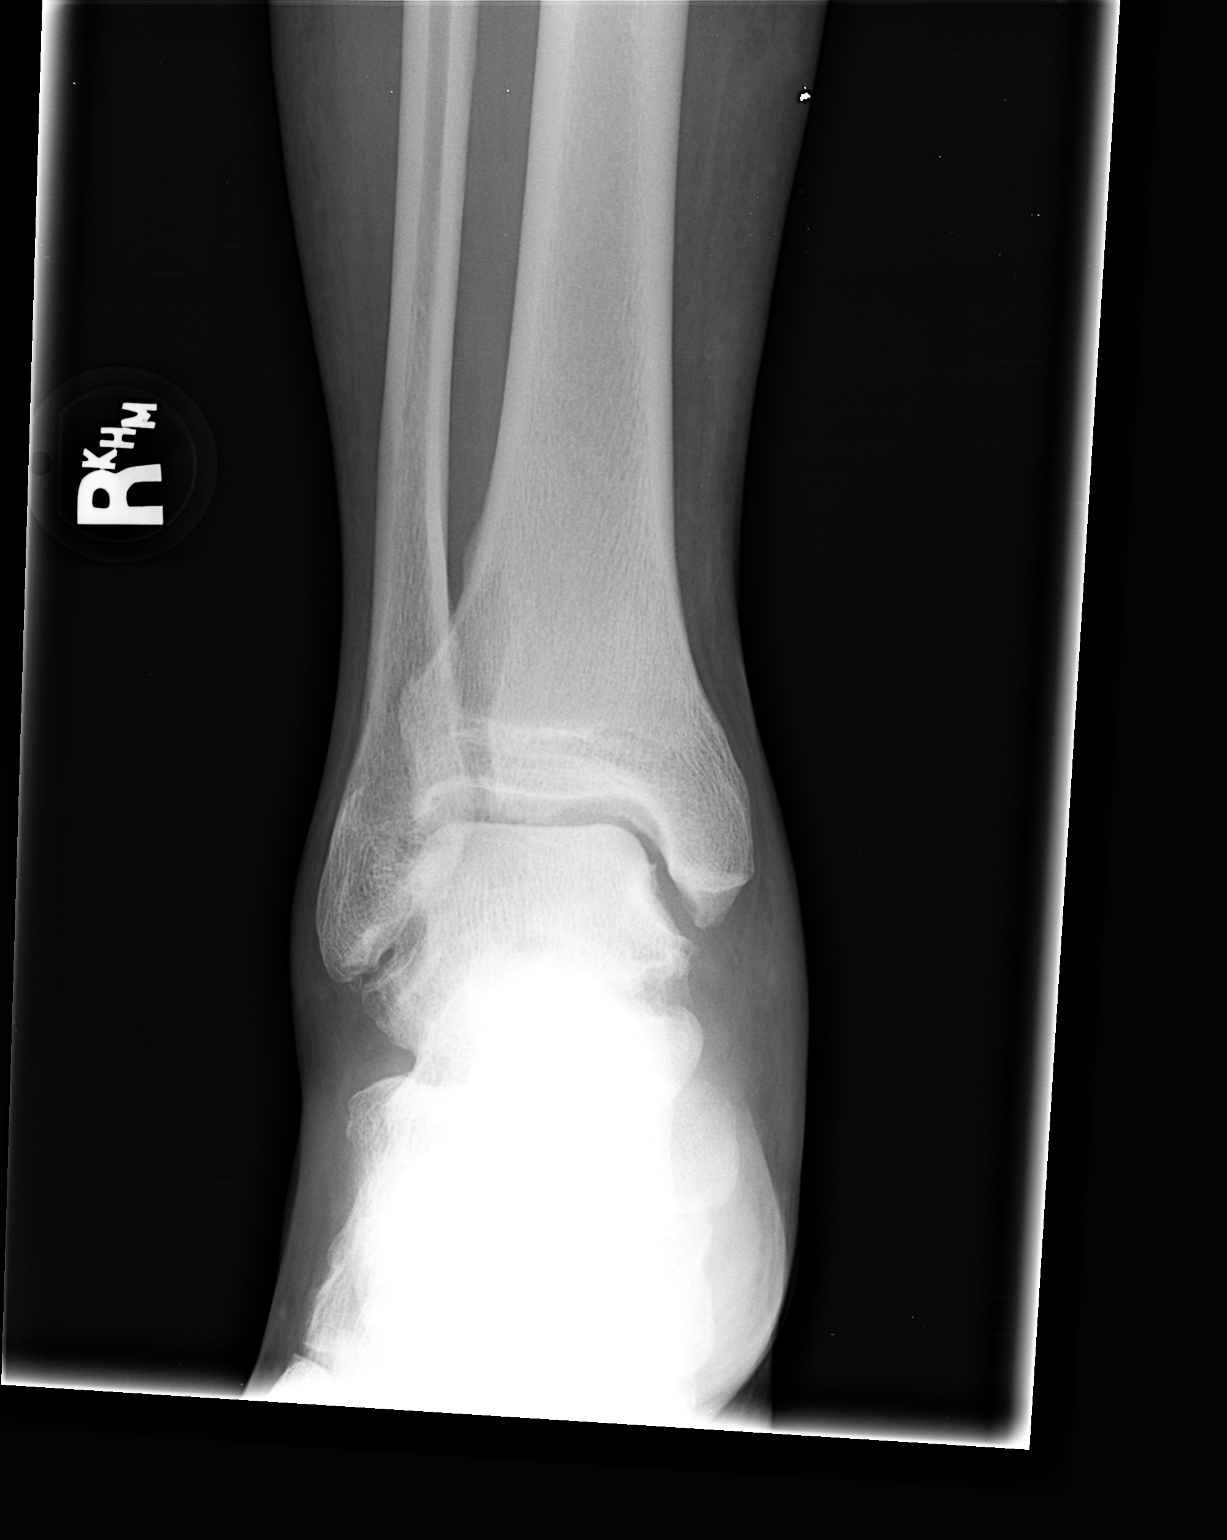

[view not recorded (2 of 3)]
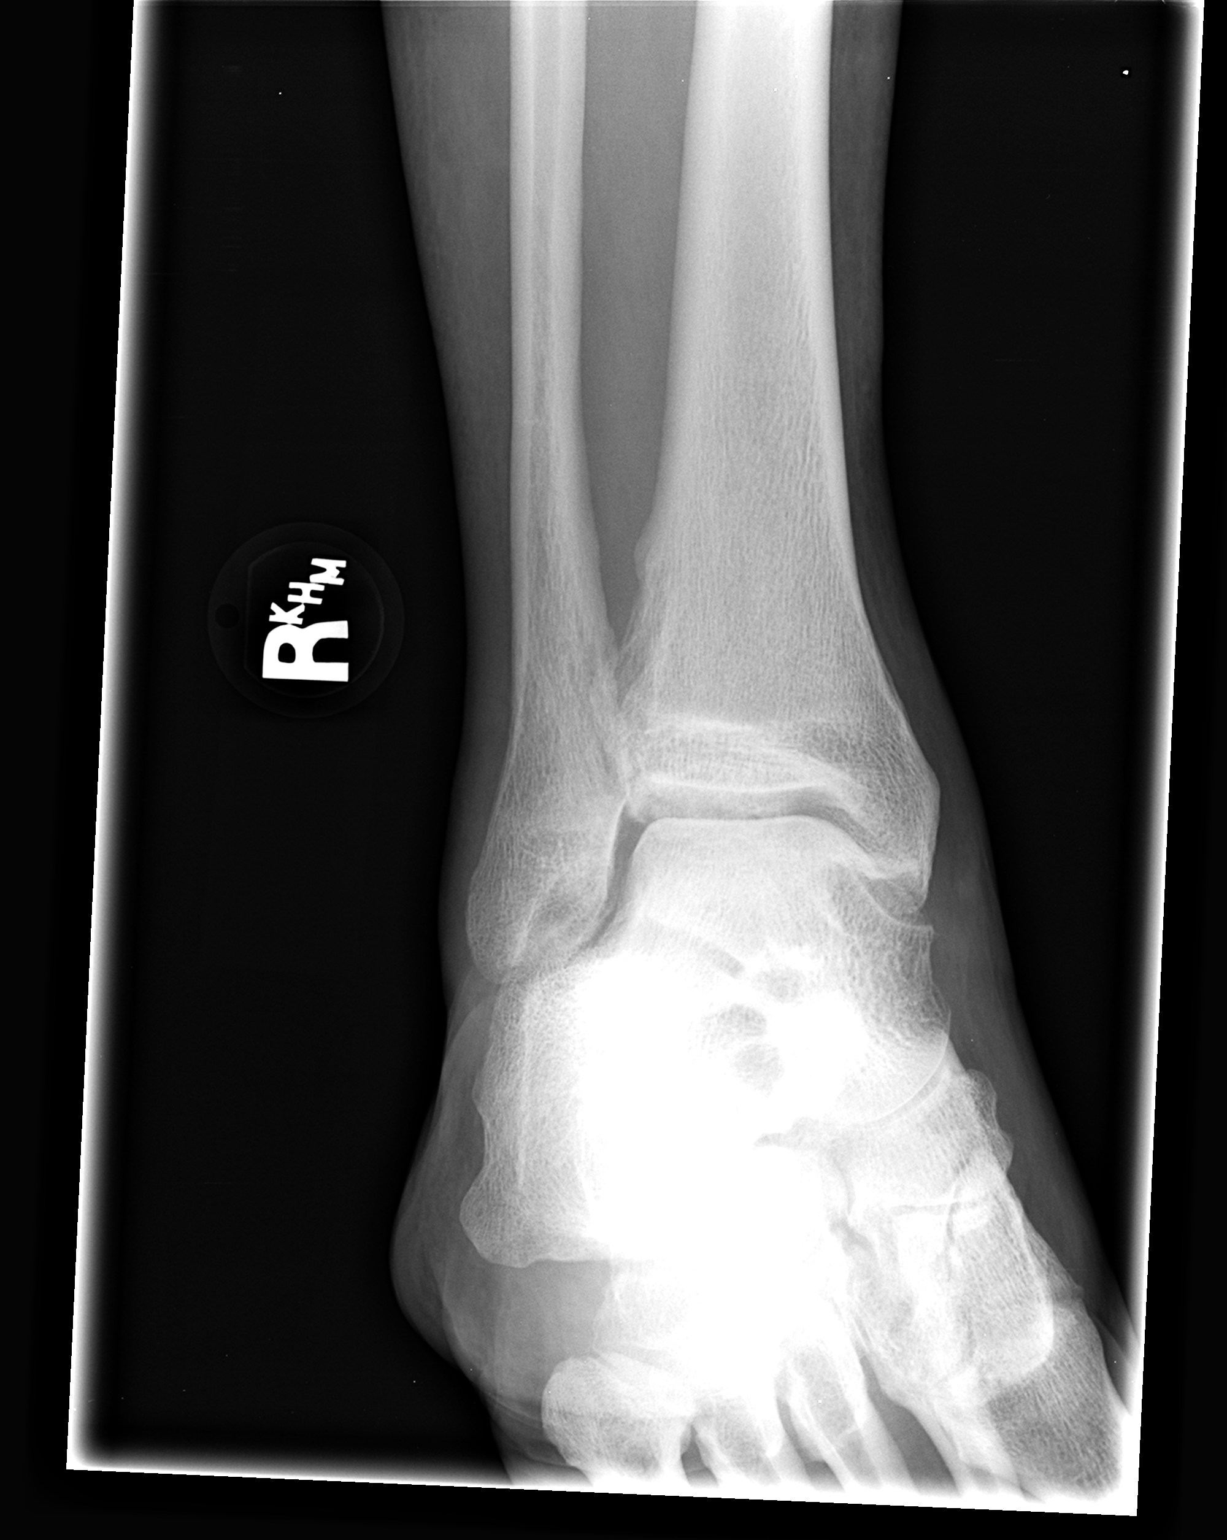

[view not recorded (3 of 3)]
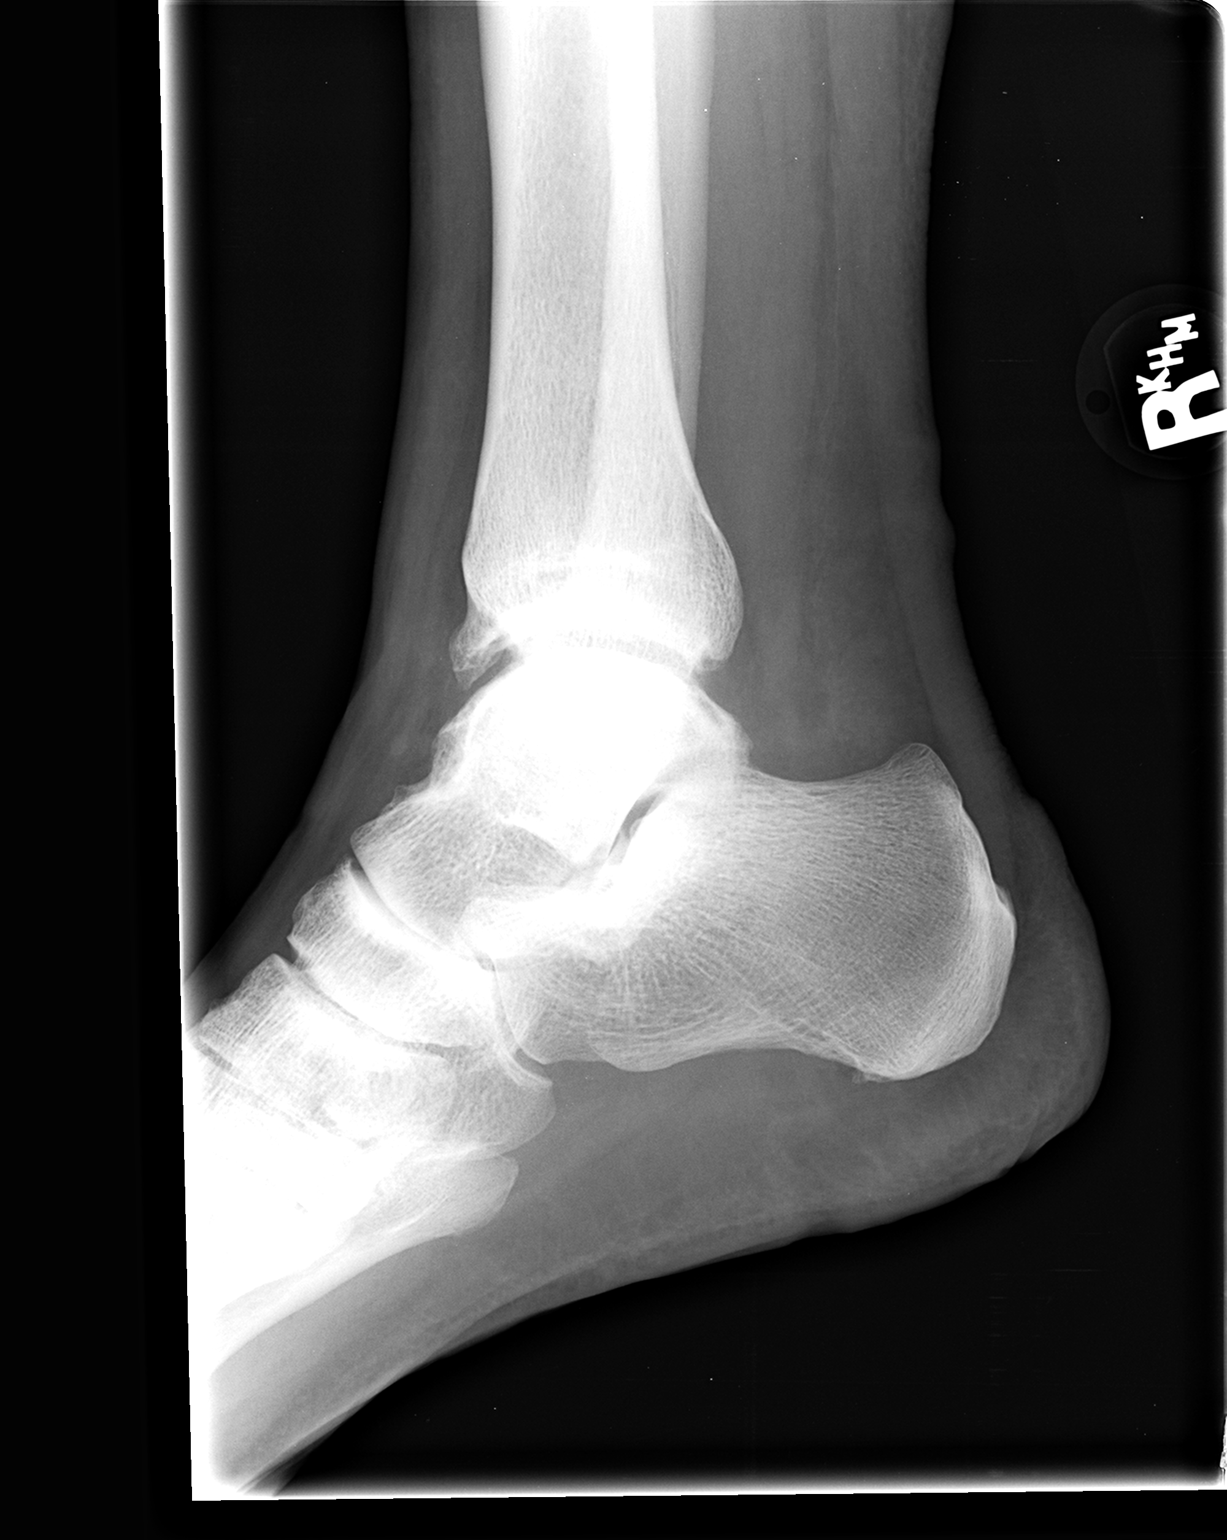

[3 of 3 positions shown; findings below may reference images not displayed]

FINDINGS: Frontal, oblique, and lateral views were obtained. There is no
fracture or effusion. Ankle mortise appears intact. There is
moderate generalized osteoarthritic change. No erosive change.
IMPRESSION: Osteoarthritic change, not significantly changed from prior study.
No fracture. Mortise intact.

## 2015-05-17 ENCOUNTER — Encounter: Payer: Self-pay | Admitting: Physical Therapy

## 2015-05-18 ENCOUNTER — Encounter: Payer: Self-pay | Admitting: Physical Therapy

## 2015-05-20 ENCOUNTER — Ambulatory Visit: Payer: Medicare Other | Admitting: Physical Therapy

## 2015-05-20 DIAGNOSIS — R6889 Other general symptoms and signs: Secondary | ICD-10-CM

## 2015-05-20 DIAGNOSIS — M25661 Stiffness of right knee, not elsewhere classified: Secondary | ICD-10-CM | POA: Diagnosis not present

## 2015-05-20 DIAGNOSIS — M25461 Effusion, right knee: Secondary | ICD-10-CM | POA: Diagnosis not present

## 2015-05-20 NOTE — Therapy (Addendum)
[facility] [place], [place], [address] Phone: [phone]   Fax:  [phone]  Physical Therapy Treatment  Patient Details  Name: Jordan [name] MRN: [id number] Date of Birth: 21-Jun-1945 No Data Recorded  Encounter Date: 05/20/2015      PT End of Session - 05/20/15 1437    Visit Number 14   Number of Visits 16   Date for PT Re-Evaluation 05/17/15   PT Start Time [id number]   PT Stop Time 1523   PT Time Calculation (min) 48 min   Activity Tolerance Patient tolerated treatment well   Behavior During Therapy [facility] for tasks assessed/performed      Past Medical History  Diagnosis Date  . CAD (coronary artery disease)     Stent LAD, 2002  /  nuclear 2009, no ischemia  /    [facility] October, 2011 nuclear, no ischemia, possible slight apical scar, ejection fraction 70%  . Ejection fraction     EF 65-70%, echo, 2007 /  EF 70%, nuclear, 2011  . Atrial fibrillation     Paroxysmal  . Warfarin anticoagulation     Low CHADS , score, but patient wants to be aggressive  . Drug intolerance     Mild beta blocker intolerance  . Incomplete RBBB   . Syncope     November, 2010  . Carotid bruit     Doppler November, 2011, normal  . Knee pain     November, 201  . BPH (benign prostatic hyperplasia)   . Back pain   . Anxiety     Mild  . Dyslipidemia   . Colon polyps   . Palpitations   . IBS (irritable bowel syndrome)   . Personal history of colonic adenomas 11/29/2006    Qualifier: Diagnosis of  By: [place], [place]      Past Surgical History  Procedure Laterality Date  . Colonoscopy  multiple  . Back surgery  05/1990    L4L5S1  . Cardiac catheterization      There were no vitals filed for this visit.  Visit Diagnosis:  Stiffness of right knee  Activity intolerance      Subjective Assessment - 05/20/15 1437    Subjective Patient reports the knee is doing great. He still gets a little swelling at the end of the day,  but "nothing like it was." He also reports some continued tightness in quad tendon.   Patient Stated Goals to be able to ski with his grandkids this winter.   Currently in Pain? No/denies                         [facility] Adult PT Treatment/Exercise - 05/20/15 0001    Knee/Hip Exercises: Aerobic   Stationary Bike L 3 x 12 min   Knee/Hip Exercises: Machines for Strengthening   Cybex Knee Extension 20#  R only 2x20   Cybex Knee Flexion 40# R only 2 x20   Knee/Hip Exercises: Standing   Other Standing Knee Exercises Balance on BOSU with resistance of bands; squats on bosu   Modalities   Modalities Ultrasound   Ultrasound   Ultrasound Location R quad tendon   Ultrasound Parameters 1.5 wcm2 3.3 mhz x 8 min   Ultrasound Goals Other (Comment)  stiffness                  PT Short Term Goals - 05/20/15 1439    PT SHORT TERM GOAL #1  Title I with initial HEP (04/05/15)   Time 2   Status Achieved   PT SHORT TERM GOAL #2   Title decreased edema in R knee to within .5 cm of L (04/19/15)   Time 4   Period Weeks   Status Achieved   PT SHORT TERM GOAL #3   Title decreased pain in R knee with amb to 5/10 or less (04/19/15)   Time 4   Period Weeks   Status Achieved           PT Long Term Goals - 06/10/15 1440    PT LONG TERM GOAL #1   Title I with advanced HEP   Time 8   Period Weeks   Status Achieved   PT LONG TERM GOAL #2   Title decreased pain in R knee with standing and walking to 2/10 or less   Time 8   Period Weeks   Status Achieved   PT LONG TERM GOAL #3   Title Able to climb stairs without increased pain in R knee   Time 8   Period Weeks   Status Achieved   PT LONG TERM GOAL #4   Title improved LLE strength to 5-/5    Time 8   Period Weeks   Status Achieved   PT LONG TERM GOAL #5   Title improved R knee ROM 2-120 degrees   Time 8   Period Weeks   Status Achieved               Plan - 06-10-2015 1443    Clinical Impression  Statement Patient has met all LTGs and complains only of slight swelling at end of day. He states he is 95% better. Patient obtained Superfeet orthotics for his shoes as well and has already noticed improvement. Patient plans to continue with gym program to keep strength up in BLEs.   Pt will benefit from skilled therapeutic intervention in order to improve on the following deficits Decreased range of motion;Pain;Decreased activity tolerance;Increased edema;Decreased strength   Rehab Potential Good   PT Frequency 2x / week   PT Duration 8 weeks   PT Treatment/Interventions ADLs/Self Care Home Management;Cryotherapy;IT trainer;Therapeutic exercise;Manual techniques;Vasopneumatic Device;Taping;Patient/family education;Passive range of motion;Neuromuscular re-education;Balance training   PT Next Visit Plan see D/C summary   Consulted and Agree with Plan of Care Patient          G-Codes - 10-Jun-2015 1532    Functional Assessment Tool Used Clinical Judgement; Patient reports he is 95% better.   Functional Limitation Mobility: Walking and moving around   Mobility: Walking and Moving Around Current Status [phone]) At least 1 percent but less than 20 percent impaired, limited or restricted   Mobility: Walking and Moving Around Goal Status [phone]) At least 40 percent but less than 60 percent impaired, limited or restricted   Mobility: Walking and Moving Around Discharge Status [phone]) At least 1 percent but less than 20 percent impaired, limited or restricted      Problem List Patient Active Problem List   Diagnosis Date Noted  . Chest pain 02/23/2013  . CAD (coronary artery disease)   . Ejection fraction   . Atrial fibrillation ([place])   . Drug intolerance   . Incomplete RBBB   . Carotid bruit   . Knee pain   . Anxiety   . hyperlipidemia   . Palpitations   . BPH (benign prostatic hypertrophy) 02/06/2009  . Personal history of colonic adenomas 11/29/2006     Almyra Free  [place] Urieta PT  05/20/2015, 3:40 PM  [facility] [address] [place], [place], [address] Phone: [phone]   Fax:  [phone]  Name: EDI GORNIAK MRN: [id number] Date of Birth: 1946-04-16  PHYSICAL THERAPY DISCHARGE SUMMARY  Visits from Start of Care: 14  Current functional level related to goals / functional outcomes: See above   Remaining deficits: See above   Education / Equipment: HEP  Plan: Patient agrees to discharge.  Patient goals were met. Patient is being discharged due to meeting the stated rehab goals.  ?????       Madelyn Flavors, PT 05/20/2015 3:42 PM [facility] [place], [place], [address] Phone: [phone]   Fax:  [phone]

## 2015-05-21 DIAGNOSIS — D485 Neoplasm of uncertain behavior of skin: Secondary | ICD-10-CM | POA: Diagnosis not present

## 2015-05-21 DIAGNOSIS — Z85828 Personal history of other malignant neoplasm of skin: Secondary | ICD-10-CM | POA: Diagnosis not present

## 2015-05-21 DIAGNOSIS — D2372 Other benign neoplasm of skin of left lower limb, including hip: Secondary | ICD-10-CM | POA: Diagnosis not present

## 2015-05-21 DIAGNOSIS — L570 Actinic keratosis: Secondary | ICD-10-CM | POA: Diagnosis not present

## 2015-05-21 DIAGNOSIS — D225 Melanocytic nevi of trunk: Secondary | ICD-10-CM | POA: Diagnosis not present

## 2015-05-21 DIAGNOSIS — D1801 Hemangioma of skin and subcutaneous tissue: Secondary | ICD-10-CM | POA: Diagnosis not present

## 2015-05-21 DIAGNOSIS — L820 Inflamed seborrheic keratosis: Secondary | ICD-10-CM | POA: Diagnosis not present

## 2015-05-21 DIAGNOSIS — L812 Freckles: Secondary | ICD-10-CM | POA: Diagnosis not present

## 2015-05-21 DIAGNOSIS — L821 Other seborrheic keratosis: Secondary | ICD-10-CM | POA: Diagnosis not present

## 2015-05-24 ENCOUNTER — Ambulatory Visit: Payer: Medicare Other | Admitting: Family Medicine

## 2015-05-25 ENCOUNTER — Encounter: Payer: Self-pay | Admitting: Family Medicine

## 2015-05-25 ENCOUNTER — Ambulatory Visit (INDEPENDENT_AMBULATORY_CARE_PROVIDER_SITE_OTHER): Payer: Medicare Other

## 2015-05-25 ENCOUNTER — Ambulatory Visit (INDEPENDENT_AMBULATORY_CARE_PROVIDER_SITE_OTHER): Payer: Medicare Other | Admitting: Family Medicine

## 2015-05-25 VITALS — BP 115/77 | HR 66 | Temp 97.0°F | Ht 74.0 in | Wt 223.0 lb

## 2015-05-25 DIAGNOSIS — I2510 Atherosclerotic heart disease of native coronary artery without angina pectoris: Secondary | ICD-10-CM | POA: Diagnosis not present

## 2015-05-25 DIAGNOSIS — I480 Paroxysmal atrial fibrillation: Secondary | ICD-10-CM

## 2015-05-25 DIAGNOSIS — Z0000 Encounter for general adult medical examination without abnormal findings: Secondary | ICD-10-CM | POA: Diagnosis not present

## 2015-05-25 DIAGNOSIS — N400 Enlarged prostate without lower urinary tract symptoms: Secondary | ICD-10-CM

## 2015-05-25 DIAGNOSIS — M25561 Pain in right knee: Secondary | ICD-10-CM | POA: Diagnosis not present

## 2015-05-25 DIAGNOSIS — R972 Elevated prostate specific antigen [PSA]: Secondary | ICD-10-CM

## 2015-05-25 DIAGNOSIS — E785 Hyperlipidemia, unspecified: Secondary | ICD-10-CM

## 2015-05-25 DIAGNOSIS — I2583 Coronary atherosclerosis due to lipid rich plaque: Secondary | ICD-10-CM

## 2015-05-25 MED ORDER — DILTIAZEM HCL ER COATED BEADS 180 MG PO CP24: 180.0000 mg | ORAL_CAPSULE | Freq: Every day | ORAL | Status: DC

## 2015-05-25 MED ORDER — ATORVASTATIN CALCIUM 80 MG PO TABS: ORAL_TABLET | ORAL | Status: DC

## 2015-05-25 MED ORDER — DILTIAZEM HCL 60 MG PO TABS: 60.0000 mg | ORAL_TABLET | ORAL | Status: DC | PRN

## 2015-05-25 NOTE — Progress Notes (Signed)
Subjective:    Patient ID: Jordan [name], male    DOB: November 25, 1945, 69 y.o.   MRN: [id number]  HPI Pt here for follow up and management of chronic medical problems which includes hyperlipidemia, BPH, and a fib. He is taking medications regularly. The patient comes in today with no specific complaints. He is followed regularly by the cardiologist for his paroxysmal atrial fibrillation and by the urologist for his elevated PSA. He recently finished physical therapy for his right knee issues and has had a skin lesion removed by the dermatologist. He is requesting a refill on his atorvastatin. He will get a chest x-ray today and lab work today and the PSA will be sent to Dr. Jeffie Pollock his urologist. The patient denies chest pain shortness of breath trouble swallowing and heartburn indigestion nausea vomiting diarrhea or blood in the stool. He does have some occasional leakage from his bladder when it is full because of the large prostate. His right knee is doing better since he's had physical therapy a cortisone injection and some fluid removed from the knee for a torn meniscus. He has an appointment with his new cardiologist in January.      Patient Active Problem List   Diagnosis Date Noted  . Chest pain 02/23/2013  . CAD (coronary artery disease)   . Ejection fraction   . Atrial fibrillation ([place])   . Drug intolerance   . Incomplete RBBB   . Carotid bruit   . Knee pain   . Anxiety   . hyperlipidemia   . Palpitations   . BPH (benign prostatic hypertrophy) 02/06/2009  . Personal history of colonic adenomas 11/29/2006   Outpatient Encounter Prescriptions as of 05/25/2015  Medication Sig  . apixaban (ELIQUIS) 5 MG TABS tablet Take 1 tablet (5 mg total) by mouth 2 (two) times daily.  Marland Kitchen atorvastatin (LIPITOR) 80 MG tablet TAKE (1/2) TABLET DAILY.  . Calcium Carb-Cholecalciferol (CALCIUM 600+D3 PO) Take 1 capsule by mouth daily.   . Cholecalciferol (VITAMIN D3) 1000 UNITS CAPS Take 1,000  Units by mouth daily.   Marland Kitchen diltiazem (CARDIZEM CD) 180 MG 24 hr capsule Take 1 capsule (180 mg total) by mouth daily.  Marland Kitchen diltiazem (CARDIZEM) 60 MG tablet Take 1 tablet (60 mg total) by mouth as needed.  . fish oil-omega-3 fatty acids 1000 MG capsule Take 1 g by mouth daily.   . Glucosamine-Chondroitin 1500-1200 MG/30ML LIQD Take 1,500 mg by mouth daily.   . Multiple Vitamins-Minerals (MULTIVITAMIN WITH MINERALS) tablet Take 1 tablet by mouth daily.   . nitroGLYCERIN (NITROSTAT) 0.4 MG SL tablet Place 1 tablet (0.4 mg total) under the tongue every 5 (five) minutes as needed for chest pain.  . [DISCONTINUED] atorvastatin (LIPITOR) 80 MG tablet TAKE (1/2) TABLET DAILY.  . [DISCONTINUED] diltiazem (CARDIZEM CD) 180 MG 24 hr capsule Take 1 capsule (180 mg total) by mouth daily.  . [DISCONTINUED] diltiazem (CARDIZEM) 60 MG tablet Take 1 tablet (60 mg total) by mouth as needed.   No facility-administered encounter medications on file as of 05/25/2015.      Review of Systems  Constitutional: Negative.   HENT: Negative.   Eyes: Negative.   Respiratory: Negative.   Cardiovascular: Negative.   Gastrointestinal: Negative.   Endocrine: Negative.   Genitourinary: Negative.   Musculoskeletal: Negative.   Skin: Negative.   Allergic/Immunologic: Negative.   Neurological: Negative.   Hematological: Negative.   Psychiatric/Behavioral: Negative.        Objective:   Physical Exam  Constitutional:  He is oriented to person, place, and time. He appears well-developed and well-nourished. No distress.  HENT:  Head: Normocephalic and atraumatic.  Right Ear: External ear normal.  Left Ear: External ear normal.  Nose: Nose normal.  Mouth/Throat: Oropharynx is clear and moist. No oropharyngeal exudate.  Eyes: Conjunctivae and EOM are normal. Pupils are equal, round, and reactive to light. Right eye exhibits no discharge. Left eye exhibits no discharge. No scleral icterus.  Neck: Normal range of motion.  Neck supple. No thyromegaly present.  No carotid bruits or thyromegaly  Cardiovascular: Normal rate, regular rhythm, normal heart sounds and intact distal pulses.   No murmur heard. The rhythm is regular at 72/m  Pulmonary/Chest: Effort normal and breath sounds normal. No respiratory distress. He has no wheezes. He has no rales. He exhibits no tenderness.  Clear anteriorly and posteriorly and no axillary adenopathy  Abdominal: Soft. Bowel sounds are normal. He exhibits no mass. There is no tenderness. There is no rebound and no guarding.  No abdominal tenderness or masses.  Genitourinary:  The patient sees the urologist, Dr. Jeffie Pollock, yearly in June. He did want his PSA repeated and make sure that he gets a copy of this.  Musculoskeletal: Normal range of motion. He exhibits no edema or tenderness.  Lymphadenopathy:    He has no cervical adenopathy.  Neurological: He is alert and oriented to person, place, and time. He has normal reflexes. No cranial nerve deficit.  Skin: Skin is warm and dry. No rash noted. No erythema. No pallor.  Psychiatric: He has a normal mood and affect. His behavior is normal. Judgment and thought content normal.  Nursing note and vitals reviewed.   BP 115/77 mmHg  Pulse 66  Temp(Src) 97 F (36.1 C) (Oral)  Ht _0  (1.88 m)  Wt 223 lb (101.152 kg)  BMI 28.62 kg/m2  [facility] reading (PRIMARY) by  Dr. Brunilda Payor x-ray within normal limits                                       Assessment & Plan:  1. hyperlipidemia -The patient should continue with his current cholesterol medicine and increase his activity as tolerated on his knee problems. - DG Chest 2 View; Future - BMP8+EGFR - CBC with Differential/Platelet - Hepatic function panel - NMR, lipoprofile  2. BPH (benign prostatic hypertrophy) -Continue to follow-up with urology - CBC with Differential/Platelet - PSA, total and free  3. Paroxysmal atrial fibrillation (HCC) -Continue to follow-up with  cardiology. The rhythm is regular today at 72/m and the patient has not noticed any irregularities. - DG Chest 2 View; Future - BMP8+EGFR - CBC with Differential/Platelet - Hepatic function panel - NMR, lipoprofile  4. Health care maintenance -The patient is up-to-date on his health maintenance issues. - DG Chest 2 View; Future - BMP8+EGFR - CBC with Differential/Platelet - Hepatic function panel - NMR, lipoprofile - VITAMIN D 25 Hydroxy (Vit-D Deficiency, Fractures) - PSA, total and free  5. Coronary artery disease due to lipid rich plaque -Continue to follow-up with cardiology and continue to work aggressively with diet and take current medication  6. Right knee pain -Follow-up with orthopedist as needed  7. Elevated PSA -Send copy of PSA result to urologist and follow up with him as planned  Meds ordered this encounter  Medications  . diltiazem (CARDIZEM CD) 180 MG 24 hr capsule    Sig: Take  1 capsule (180 mg total) by mouth daily.    Dispense:  90 capsule    Refill:  3    PATIENT NEEDS TO CALL OUR OFFICE TO SCHEDULE A FOLLOW UP APPOINTMENT  . diltiazem (CARDIZEM) 60 MG tablet    Sig: Take 1 tablet (60 mg total) by mouth as needed.    Dispense:  90 tablet    Refill:  3  . atorvastatin (LIPITOR) 80 MG tablet    Sig: TAKE (1/2) TABLET DAILY.    Dispense:  90 tablet    Refill:  3   Patient Instructions                       Medicare Annual Wellness Visit  [facility] and the medical providers at [facility] strive to bring you the best medical care.  In doing so we not only want to address your current medical conditions and concerns but also to detect new conditions early and prevent illness, disease and health-related problems.    Medicare offers a yearly Wellness Visit which allows our clinical staff to assess your need for preventative services including immunizations, lifestyle education, counseling to decrease risk of preventable diseases  and screening for fall risk and other medical concerns.    This visit is provided free of charge (no copay) for all Medicare recipients. The clinical pharmacists at [facility] have begun to conduct these Wellness Visits which will also include a thorough review of all your medications.    As you primary medical provider recommend that you make an appointment for your Annual Wellness Visit if you have not done so already this year.  You may set up this appointment before you leave today or you may call back [phone]) and schedule an appointment.  Please make sure when you call that you mention that you are scheduling your Annual Wellness Visit with the clinical pharmacist so that the appointment may be made for the proper length of time.     Continue current medications. Continue good therapeutic lifestyle changes which include good diet and exercise. Fall precautions discussed with patient. If an FOBT was given today- please return it to our front desk. If you are over 58 years old - you may need Prevnar 53 or the adult Pneumonia vaccine.  **Flu shots are available--- please call and schedule a FLU-CLINIC appointment**  After your visit with [place] today you will receive a survey in the mail or online from [employer] regarding your care with [place]. Please take a moment to fill this out. Your feedback is very important to [place] as you can help [place] better understand your patient needs as well as improve your experience and satisfaction. WE CARE ABOUT YOU!!!   The patient should continue to follow-up with the urologist and the cardiologist He should try to start exercising more since his knee is improved. He should follow-up with orthopedic surgeon if he continues to have trouble with his knee.   Arrie Senate MD

## 2015-05-25 NOTE — Patient Instructions (Addendum)
Medicare Annual Wellness Visit  [facility] and the medical providers at [facility] strive to bring you the best medical care.  In doing so we not only want to address your current medical conditions and concerns but also to detect new conditions early and prevent illness, disease and health-related problems.    Medicare offers a yearly Wellness Visit which allows our clinical staff to assess your need for preventative services including immunizations, lifestyle education, counseling to decrease risk of preventable diseases and screening for fall risk and other medical concerns.    This visit is provided free of charge (no copay) for all Medicare recipients. The clinical pharmacists at [facility] have begun to conduct these Wellness Visits which will also include a thorough review of all your medications.    As you primary medical provider recommend that you make an appointment for your Annual Wellness Visit if you have not done so already this year.  You may set up this appointment before you leave today or you may call back [phone] and schedule an appointment.  Please make sure when you call that you mention that you are scheduling your Annual Wellness Visit with the clinical pharmacist so that the appointment may be made for the proper length of time.     Continue current medications. Continue good therapeutic lifestyle changes which include good diet and exercise. Fall precautions discussed with patient. If an FOBT was given today- please return it to our front desk. If you are over 79 years old - you may need Prevnar 57 or the adult Pneumonia vaccine.  **Flu shots are available--- please call and schedule a FLU-CLINIC appointment**  After your visit with [place] today you will receive a survey in the mail or online from [employer] regarding your care with [place]. Please take a moment to fill this out. Your feedback is very  important to [place] as you can help [place] better understand your patient needs as well as improve your experience and satisfaction. WE CARE ABOUT YOU!!!   The patient should continue to follow-up with the urologist and the cardiologist He should try to start exercising more since his knee is improved. He should follow-up with orthopedic surgeon if he continues to have trouble with his knee.

## 2015-05-26 LAB — NMR, LIPOPROFILE
CHOLESTEROL: 130 mg/dL (ref 100–199)
HDL Cholesterol by NMR: 41 mg/dL (ref >39)
HDL Particle Number: 29 umol/L — ABNORMAL LOW (ref >=30.5)
LDL PARTICLE NUMBER: 759 nmol/L (ref <1000)
LDL SIZE: 20.9 nm (ref >20.5)
LDL-C: 73 mg/dL (ref 0–99)
LP-IR SCORE: 37 (ref <=45)
Small LDL Particle Number: <90 nmol/L (ref <=527)
Triglycerides by NMR: 81 mg/dL (ref 0–149)

## 2015-05-26 LAB — CBC WITH DIFFERENTIAL/PLATELET
BASOS ABS: 0 10*3/uL (ref 0.0–0.2)
BASOS: 0 %
EOS (ABSOLUTE): 0.3 10*3/uL (ref 0.0–0.4)
EOS: 4 %
HEMOGLOBIN: 15.8 g/dL (ref 12.6–17.7)
Hematocrit: 46.6 % (ref 37.5–51.0)
IMMATURE GRANS (ABS): 0 10*3/uL (ref 0.0–0.1)
Immature Granulocytes: 0 %
LYMPHS: 24 %
Lymphocytes Absolute: 1.6 10*3/uL (ref 0.7–3.1)
MCH: 30.1 pg (ref 26.6–33.0)
MCHC: 33.9 g/dL (ref 31.5–35.7)
MCV: 89 fL (ref 79–97)
MONOCYTES: 9 %
Monocytes Absolute: 0.6 10*3/uL (ref 0.1–0.9)
NEUTROS ABS: 4.3 10*3/uL (ref 1.4–7.0)
Neutrophils: 63 %
Platelets: 223 10*3/uL (ref 150–379)
RBC: 5.25 x10E6/uL (ref 4.14–5.80)
RDW: 13.5 % (ref 12.3–15.4)
WBC: 6.8 10*3/uL (ref 3.4–10.8)

## 2015-05-26 LAB — BMP8+EGFR
BUN/Creatinine Ratio: 22 (ref 10–22)
BUN: 23 mg/dL (ref 8–27)
CALCIUM: 9.1 mg/dL (ref 8.6–10.2)
CO2: 23 mmol/L (ref 18–29)
CREATININE: 1.03 mg/dL (ref 0.76–1.27)
Chloride: 100 mmol/L (ref 96–106)
GFR, EST AFRICAN AMERICAN: 86 mL/min/{1.73_m2} (ref >59)
GFR, EST NON AFRICAN AMERICAN: 74 mL/min/{1.73_m2} (ref >59)
Glucose: 88 mg/dL (ref 65–99)
Potassium: 4.6 mmol/L (ref 3.5–5.2)
Sodium: 141 mmol/L (ref 134–144)

## 2015-05-26 LAB — HEPATIC FUNCTION PANEL
ALK PHOS: 58 IU/L (ref 39–117)
ALT: 23 IU/L (ref 0–44)
AST: 27 IU/L (ref 0–40)
Albumin: 4 g/dL (ref 3.6–4.8)
BILIRUBIN, DIRECT: 0.2 mg/dL (ref 0.00–0.40)
Bilirubin Total: 1 mg/dL (ref 0.0–1.2)
TOTAL PROTEIN: 6.5 g/dL (ref 6.0–8.5)

## 2015-05-26 LAB — PSA, TOTAL AND FREE
PROSTATE SPECIFIC AG, SERUM: 10.9 ng/mL — AB (ref 0.0–4.0)
PSA FREE: 2.37 ng/mL
PSA, Free Pct: 21.7 %

## 2015-05-26 LAB — VITAMIN D 25 HYDROXY (VIT D DEFICIENCY, FRACTURES): Vit D, 25-Hydroxy: 53 ng/mL (ref 30.0–100.0)

## 2015-06-24 ENCOUNTER — Ambulatory Visit (INDEPENDENT_AMBULATORY_CARE_PROVIDER_SITE_OTHER): Payer: Medicare Other | Admitting: Cardiology

## 2015-06-24 ENCOUNTER — Encounter: Payer: Self-pay | Admitting: Cardiology

## 2015-06-24 VITALS — BP 116/70 | HR 64 | Ht 74.0 in | Wt 223.4 lb

## 2015-06-24 DIAGNOSIS — I2510 Atherosclerotic heart disease of native coronary artery without angina pectoris: Secondary | ICD-10-CM

## 2015-06-24 DIAGNOSIS — E785 Hyperlipidemia, unspecified: Secondary | ICD-10-CM | POA: Diagnosis not present

## 2015-06-24 DIAGNOSIS — Z7901 Long term (current) use of anticoagulants: Secondary | ICD-10-CM | POA: Diagnosis not present

## 2015-06-24 DIAGNOSIS — I480 Paroxysmal atrial fibrillation: Secondary | ICD-10-CM

## 2015-06-24 NOTE — Patient Instructions (Signed)

## 2015-06-24 NOTE — Progress Notes (Signed)
Cardiology Office Note   Date:  06/24/2015   ID:  Jordan [name], DOB [birth year], MRN [id number]  PCP:  Redge Gainer, MD  Cardiologist:   Candee Furbish, MD (Former Ron Parker)      History of Present Illness: Jordan [name] is a 70 y.o. male who presents for follow-up of coronary artery disease, paroxysmal age fibrillation. 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  No chest pain, no shortness of breath, no orthopnea, no PND.  Stent in 2002 to the LAD, nuclear in 2011 low risk. Possible slight apical scar, EF 70%.  Prior bout of syncope in 2010.  No bleeding issues with anticoagulation.  Right knee pain. Dr. Jaynee Eagles. Meniscal tear. Physical therapy.   He takes his grandchildren on a ski trip every year. [place]  Past Medical History  Diagnosis Date  . CAD (coronary artery disease)     Stent LAD, 2002  /  nuclear 2009, no ischemia  /    [facility] October, 2011 nuclear, no ischemia, possible slight apical scar, ejection fraction 70%  . Ejection fraction     EF 65-70%, echo, 2007 /  EF 70%, nuclear, 2011  . Atrial fibrillation (HCC)     Paroxysmal  . Warfarin anticoagulation     Low CHADS , score, but patient wants to be aggressive  . Drug intolerance     Mild beta blocker intolerance  . Incomplete RBBB   . Syncope     November, 2010  . Carotid bruit     Doppler November, 2011, normal  . Knee pain     November, 201  . BPH (benign prostatic hyperplasia)   . Back pain   . Anxiety     Mild  . Dyslipidemia   . Colon polyps   . Palpitations   . IBS (irritable bowel syndrome)   . Personal history of colonic adenomas 11/29/2006    Qualifier: Diagnosis of  By: Jordan [name], [place]      Past Surgical History  Procedure Laterality Date  . Colonoscopy  multiple  . Back surgery  05/1990    [id number]  . Cardiac catheterization         Current Outpatient Prescriptions  Medication Sig Dispense Refill  . apixaban (ELIQUIS) 5 MG TABS tablet Take 1 tablet (5 mg total) by mouth 2 (two) times daily. 60 tablet 6  . atorvastatin (LIPITOR) 80 MG tablet Take 80 mg by mouth daily. Take 1/2 tablet daily    . Calcium Carb-Cholecalciferol (CALCIUM 600+D3 PO) Take 1 capsule by mouth daily.     . Cholecalciferol (VITAMIN D3) 1000 UNITS CAPS Take 1,000 Units by mouth daily.     Marland Kitchen diltiazem (CARDIZEM CD) 180 MG 24 hr capsule Take 1 capsule (180 mg total) by mouth daily. 90 capsule 3  . diltiazem (CARDIZEM) 60 MG tablet Take 1 tablet (60 mg total) by mouth as needed. 90 tablet 3  . fish oil-omega-3 fatty acids 1000 MG capsule Take 1 g by mouth daily.     . Glucosamine-Chondroitin 1500-1200 MG/30ML LIQD Take 1,500 mg by mouth daily.     . Multiple Vitamins-Minerals (MULTIVITAMIN WITH MINERALS) tablet Take 1 tablet by mouth daily.     . nitroGLYCERIN (NITROSTAT) 0.4 MG SL tablet Place 1 tablet (0.4 mg total) under the tongue every 5 (five) minutes as needed for chest  pain. 25 tablet 3   No current facility-administered medications for this visit.    Allergies:   Levofloxacin; Lopid; and Sulfonamide derivatives    Social History:  The patient  reports that he has never smoked. He has never used smokeless tobacco. He reports that he does not drink alcohol or use illicit drugs.   Family History:  The patient's family history includes Heart attack in his brother and mother.   Mother - 104 MI Father - 5 died  ROS:  Please see the history of present illness.   Otherwise, review of systems are positive for none.   All other systems are reviewed and negative.    PHYSICAL EXAM: VS:  BP 116/70 mmHg  Pulse 64  Ht 6\' 2"  (1.88 m)  Wt 223 lb 6.4 oz (101.334 kg)  BMI 28.67 kg/m2  SpO2 98% , BMI Body mass index is 28.67 kg/(m^2). GEN: Well nourished, well developed, in no acute distress HEENT: normal Neck: no JVD,  very soft carotid  bruits, or masses Cardiac: RRR; no murmurs, rubs, or gallops,no edema  Respiratory:  clear to auscultation bilaterally, normal work of breathing GI: soft, nontender, nondistended, + BS MS: no deformity or atrophy Skin: warm and dry, no rash Neuro:  Strength and sensation are intact Psych: euthymic mood, full affect   EKG: Prior reviewed, sinus bradycardia, nonspecific ST-T wave changes no significant change from prior.  Recent Labs: 11/13/2014: Hemoglobin 15.1 05/25/2015: ALT 23; BUN 23; Creatinine, Ser 1.03; Platelets 223; Potassium 4.6; Sodium 141    Lipid Panel    Component Value Date/Time   CHOL 130 05/25/2015 0845   CHOL 125 03/06/2013 0821   TRIG 81 05/25/2015 0845   TRIG 73 03/06/2013 0821   TRIG 81 02/24/2013 0900   HDL 41 05/25/2015 0845   HDL 40 03/06/2013 0821   HDL 40 02/24/2013 0900   CHOLHDL 2.8 02/24/2013 0900   VLDL 16 02/24/2013 0900   LDLCALC 68 11/18/2013 0804   LDLCALC 70 03/06/2013 0821   LDLCALC 55 02/24/2013 0900      Wt Readings from Last 3 Encounters:  06/24/15 223 lb 6.4 oz (101.334 kg)  05/25/15 223 lb (101.152 kg)  11/18/14 215 lb (97.523 kg)      Other studies Reviewed: Additional studies/ records that were reviewed today include: Prior office notes, lab, EKG, nuclear stress test Review of the above records demonstrates: As above   ASSESSMENT AND PLAN:  Coronary artery disease  - LAD stent in 2002  - Nuclear stress test 2011 low risk, no ischemia, small apical defect  - Normal EF  - Aggressive secondary prevention  - No aspirin secondary to Eliquis  Paroxysmal atrial fibrillation  - Requested aggressive anticoagulation management  - CHADS VASc - 2, age, CAD  - Continue Eliquis, discussed benefits  - Lab work reviewed, creatinine 1.08, Hemoglobin normal  Chronic anticoagulation  - Eliquis  Carotid bruit  - Prior Doppler normal 2011.  - Barely able to appreciate today on exam  Hyperlipidemia  - Lab work reviewed as above.  No change in therapy.  - In the past, used to take niacin. We discussed  Right knee pain  - Meniscal tear   Current medicines are reviewed at length with the patient today.  The patient does not have concerns regarding medicines.  The following changes have been made:  no change  Labs/ tests ordered today include: none  No orders of the defined types were placed in this encounter.  Disposition:   FU with Tessy Pawelski in 6 months  Signed, Candee Furbish, MD  06/24/2015 9:57 AM    [facility] [name], [place], [place]  [id number] Phone: [phone]; Fax: [phone]

## 2015-08-20 ENCOUNTER — Encounter: Payer: Self-pay | Admitting: *Deleted

## 2015-09-22 ENCOUNTER — Emergency Department (HOSPITAL_COMMUNITY): Payer: Medicare Other

## 2015-09-22 ENCOUNTER — Other Ambulatory Visit: Payer: Self-pay | Admitting: Cardiology

## 2015-09-22 ENCOUNTER — Emergency Department (HOSPITAL_COMMUNITY)
Admission: EM | Admit: 2015-09-22 | Discharge: 2015-09-22 | Disposition: A | Discharge status: Home / Self Care | Payer: Medicare Other | Attending: Emergency Medicine | Admitting: Emergency Medicine

## 2015-09-22 ENCOUNTER — Encounter (HOSPITAL_COMMUNITY): Payer: Self-pay

## 2015-09-22 DIAGNOSIS — Z79899 Other long term (current) drug therapy: Secondary | ICD-10-CM | POA: Diagnosis not present

## 2015-09-22 DIAGNOSIS — Z86010 Personal history of colonic polyps: Secondary | ICD-10-CM | POA: Insufficient documentation

## 2015-09-22 DIAGNOSIS — Z9889 Other specified postprocedural states: Secondary | ICD-10-CM | POA: Insufficient documentation

## 2015-09-22 DIAGNOSIS — R072 Precordial pain: Secondary | ICD-10-CM

## 2015-09-22 DIAGNOSIS — Z8659 Personal history of other mental and behavioral disorders: Secondary | ICD-10-CM | POA: Diagnosis not present

## 2015-09-22 DIAGNOSIS — I480 Paroxysmal atrial fibrillation: Secondary | ICD-10-CM | POA: Insufficient documentation

## 2015-09-22 DIAGNOSIS — Z8719 Personal history of other diseases of the digestive system: Secondary | ICD-10-CM | POA: Diagnosis not present

## 2015-09-22 DIAGNOSIS — R0602 Shortness of breath: Secondary | ICD-10-CM | POA: Diagnosis not present

## 2015-09-22 DIAGNOSIS — Z7901 Long term (current) use of anticoagulants: Secondary | ICD-10-CM | POA: Diagnosis not present

## 2015-09-22 DIAGNOSIS — Z87438 Personal history of other diseases of male genital organs: Secondary | ICD-10-CM | POA: Diagnosis not present

## 2015-09-22 DIAGNOSIS — R0789 Other chest pain: Secondary | ICD-10-CM

## 2015-09-22 DIAGNOSIS — E785 Hyperlipidemia, unspecified: Secondary | ICD-10-CM | POA: Insufficient documentation

## 2015-09-22 DIAGNOSIS — R079 Chest pain, unspecified: Secondary | ICD-10-CM

## 2015-09-22 DIAGNOSIS — I2510 Atherosclerotic heart disease of native coronary artery without angina pectoris: Secondary | ICD-10-CM | POA: Insufficient documentation

## 2015-09-22 HISTORY — DX: Long term (current) use of anticoagulants: Z79.01

## 2015-09-22 LAB — CBC
HEMATOCRIT: 43.7 % (ref 39.0–52.0)
HEMOGLOBIN: 14.8 g/dL (ref 13.0–17.0)
MCH: 30.4 pg (ref 26.0–34.0)
MCHC: 33.9 g/dL (ref 30.0–36.0)
MCV: 89.7 fL (ref 78.0–100.0)
Platelets: 212 10*3/uL (ref 150–400)
RBC: 4.87 MIL/uL (ref 4.22–5.81)
RDW: 13.4 % (ref 11.5–15.5)
WBC: 8.7 10*3/uL (ref 4.0–10.5)

## 2015-09-22 LAB — I-STAT TROPONIN, ED: Troponin i, poc: 0 ng/mL (ref 0.00–0.08)

## 2015-09-22 LAB — TROPONIN I: Troponin I: 0.03 ng/mL (ref <0.031)

## 2015-09-22 LAB — COMPREHENSIVE METABOLIC PANEL
ALBUMIN: 3.3 g/dL — AB (ref 3.5–5.0)
ALK PHOS: 50 U/L (ref 38–126)
ALT: 24 U/L (ref 17–63)
AST: 26 U/L (ref 15–41)
Anion gap: 9 (ref 5–15)
BILIRUBIN TOTAL: 0.9 mg/dL (ref 0.3–1.2)
BUN: 23 mg/dL — AB (ref 6–20)
CO2: 25 mmol/L (ref 22–32)
Calcium: 8.8 mg/dL — ABNORMAL LOW (ref 8.9–10.3)
Chloride: 107 mmol/L (ref 101–111)
Creatinine, Ser: 1 mg/dL (ref 0.61–1.24)
GFR calc Af Amer: >60 mL/min (ref >60)
GFR calc non Af Amer: >60 mL/min (ref >60)
GLUCOSE: 100 mg/dL — AB (ref 65–99)
Potassium: 4.1 mmol/L (ref 3.5–5.1)
Sodium: 141 mmol/L (ref 135–145)
TOTAL PROTEIN: 6.3 g/dL — AB (ref 6.5–8.1)

## 2015-09-22 LAB — APTT: APTT: 28 s (ref 24–37)

## 2015-09-22 NOTE — ED Provider Notes (Addendum)
CSN: [id number]     Arrival date & time 09/22/15  1200 History   First MD Initiated Contact with Patient 09/22/15 1219     Chief Complaint  Patient presents with  . Chest Pain     (Consider location/radiation/quality/duration/timing/severity/associated sxs/prior Treatment) HPI Comments: Patient is a 70 year old male with a history of coronary artery disease status post stent in the LAD, paroxysmal atrial fibrillation on Eliquis presenting today with a one-day history of general fatigue and malaise and one hour of chest pain. Patient states when he woke up this morning around 6 AM he just did not feel well. He was extremely fatigued and had trouble getting going. Once at work he started to feel that his heart was beating fast and when he took his blood pressure his blood pressure was elevated and his heart rate was 99. He denies feeling that his heart was skipping a beat and it did not feel like his typical A. fib episodes. He then started to develop some chest tightness in the center of his chest that lasted for a proximally 1-1/2 hours until EMS arrived and they gave him nitroglycerin which resolved his symptoms. Currently he is asymptomatic. He did take his diltiazem and an aspirin this morning. He has not had any recent medication changes and states other than an extreme amount of stress at work nothing else is different in his life. He denies any symptoms associated with eating or positioning. He could not recall if the pain was worse with walking. He states when he is been in atrial fibrillation in the past at his made him feel poorly but he has never felt quite like this before.  Patient is a 70 y.o. male presenting with chest pain. The history is provided by the patient.  Chest Pain Pain location:  Substernal area Pain quality: dull and tightness   Pain radiates to:  Does not radiate Pain radiates to the back: no   Pain severity:  Moderate Onset quality:  Gradual Duration:  1 hour Timing:   Constant Progression:  Resolved Chronicity:  New Relieved by:  Nitroglycerin Worsened by:  Nothing tried Ineffective treatments:  None tried Associated symptoms: fatigue and palpitations   Associated symptoms: no abdominal pain, no anorexia, no back pain, no cough, no diaphoresis, no fever, no shortness of breath and not vomiting   Risk factors: coronary artery disease and hypertension   Risk factors: no prior DVT/PE, no smoking and no surgery     Past Medical History  Diagnosis Date  . CAD (coronary artery disease)     Stent LAD, 2002  /  nuclear 2009, no ischemia  /    [facility] October, 2011 nuclear, no ischemia, possible slight apical scar, ejection fraction 70%  . Ejection fraction     EF 65-70%, echo, 2007 /  EF 70%, nuclear, 2011  . Atrial fibrillation (HCC)     Paroxysmal  . Warfarin anticoagulation     Low CHADS , score, but patient wants to be aggressive  . Drug intolerance     Mild beta blocker intolerance  . Incomplete RBBB   . Syncope     November, 2010  . Carotid bruit     Doppler November, 2011, normal  . Knee pain     November, 201  . BPH (benign prostatic hyperplasia)   . Back pain   . Anxiety     Mild  . Dyslipidemia   . Colon polyps   . Palpitations   . IBS (irritable  bowel syndrome)   . Personal history of colonic adenomas 11/29/2006    Qualifier: Diagnosis of  By: [place], [place]     Past Surgical History  Procedure Laterality Date  . Colonoscopy  multiple  . Back surgery  05/1990    [id number]  . Cardiac catheterization     Family History  Problem Relation Age of Onset  . Heart attack Mother   . Heart attack Brother    Social History  Substance Use Topics  . Smoking status: Never Smoker   . Smokeless tobacco: Never Used  . Alcohol Use: No    Review of Systems  Constitutional: Positive for fatigue. Negative for fever and diaphoresis.  Respiratory: Negative for cough and shortness of breath.   Cardiovascular: Positive for chest pain  and palpitations.  Gastrointestinal: Negative for vomiting, abdominal pain and anorexia.  Musculoskeletal: Negative for back pain.  All other systems reviewed and are negative.     Allergies  Levofloxacin; Lopid; and Sulfonamide derivatives  Home Medications   Prior to Admission medications   Medication Sig Start Date End Date Taking? Authorizing Provider  apixaban (ELIQUIS) 5 MG TABS tablet Take 1 tablet (5 mg total) by mouth 2 (two) times daily. 10/26/14   Carlena Bjornstad, MD  atorvastatin (LIPITOR) 80 MG tablet Take 80 mg by mouth daily. Take 1/2 tablet daily    Historical Provider, MD  Calcium Carb-Cholecalciferol (CALCIUM 600+D3 PO) Take 1 capsule by mouth daily.     Historical Provider, MD  Cholecalciferol (VITAMIN D3) 1000 UNITS CAPS Take 1,000 Units by mouth daily.     Historical Provider, MD  diltiazem (CARDIZEM CD) 180 MG 24 hr capsule Take 1 capsule (180 mg total) by mouth daily. 05/25/15   Chipper Herb, MD  diltiazem (CARDIZEM) 60 MG tablet Take 1 tablet (60 mg total) by mouth as needed. 05/25/15   Chipper Herb, MD  fish oil-omega-3 fatty acids 1000 MG capsule Take 1 g by mouth daily.     Historical Provider, MD  Glucosamine-Chondroitin 1500-1200 MG/30ML LIQD Take 1,500 mg by mouth daily.     Historical Provider, MD  Multiple Vitamins-Minerals (MULTIVITAMIN WITH MINERALS) tablet Take 1 tablet by mouth daily.     Historical Provider, MD  nitroGLYCERIN (NITROSTAT) 0.4 MG SL tablet Place 1 tablet (0.4 mg total) under the tongue every 5 (five) minutes as needed for chest pain. 10/26/14   Carlena Bjornstad, MD   BP 116/71 mmHg  Pulse 111  Temp(Src) 98 F (36.7 C) (Oral)  Resp 21  Ht 6\' 2"  (1.88 m)  Wt 213 lb (96.616 kg)  BMI 27.34 kg/m2  SpO2 95% Physical Exam  Constitutional: He is oriented to person, place, and time. He appears well-developed and well-nourished. No distress.  HENT:  Head: Normocephalic and atraumatic.  Mouth/Throat: Oropharynx is clear and moist.  Eyes:  Conjunctivae and EOM are normal. Pupils are equal, round, and reactive to light.  Neck: Normal range of motion. Neck supple.  Cardiovascular: Normal rate, regular rhythm and intact distal pulses.   No murmur heard. Pulmonary/Chest: Effort normal and breath sounds normal. No respiratory distress. He has no wheezes. He has no rales.  Abdominal: Soft. He exhibits no distension. There is no tenderness. There is no rebound and no guarding.  Musculoskeletal: Normal range of motion. He exhibits no edema or tenderness.  Neurological: He is alert and oriented to person, place, and time.  Skin: Skin is warm and dry. No rash noted. No erythema.  Psychiatric:  He has a normal mood and affect. His behavior is normal.  Nursing note and vitals reviewed.   ED Course  Procedures (including critical care time) Labs Review Labs Reviewed  COMPREHENSIVE METABOLIC PANEL - Abnormal; Notable for the following:    Glucose, Bld 100 (*)    BUN 23 (*)    Calcium 8.8 (*)    Total Protein 6.3 (*)    Albumin 3.3 (*)    All other components within normal limits  APTT  CBC  TROPONIN I  TROPONIN I  TROPONIN I  I-STAT TROPOININ, ED  I-STAT TROPOININ, ED    Imaging Review Dg Chest Portable 1 View  09/22/2015  CLINICAL DATA:  Mid chest pain with shortness of breath today. EXAM: PORTABLE CHEST 1 VIEW COMPARISON:  05/25/2015 and 02/23/2013. FINDINGS: 1248 hours. There are lower lung volumes with mild resulting vascular crowding at both lung bases. No edema, confluent airspace opacity or pleural effusion. The heart size mediastinal contours are stable. The bones appear unremarkable. Telemetry leads overlie the chest. IMPRESSION: Suboptimal inspiration.  No acute cardiopulmonary process. Electronically Signed   By: Richardean Sale M.D.   On: 09/22/2015 13:25   I have personally reviewed and evaluated these images and lab results as part of my medical decision-making.   EKG Interpretation   Date/Time:  Wednesday  September 22 2015 12:10:33 EDT Ventricular Rate:  61 PR Interval:  153 QRS Duration: 112 QT Interval:  393 QTC Calculation: 396 R Axis:   44 Text Interpretation:  Sinus rhythm Probable left atrial enlargement  Incomplete right bundle branch block Low voltage, precordial leads No  significant change since last tracing Confirmed by Maryan Rued  MD, Loree Fee  [phone]) on 09/22/2015 12:19:04 PM      MDM   Final diagnoses:  Atypical chest pain  Paroxysmal atrial fibrillation [facility])  Patient is a 70 year old male with a history of LAD stent approximately 15 years ago and paroxysmal atrial fibrillation coming in today with not feeling well this morning and then chest pain that lasted approximately 1-1/2 hours requiring nitroglycerin for resolution. Patient states he's intermittently gotten atrial fibrillation and occasionally has had chest pain with it but it did not feel like he was in atrial fibrillation today. He did complain of some stomach upset during this time but denies any nausea, vomiting, shortness of breath or diaphoresis. He was fine last night before bed and symptoms all started around 6 AM this morning. Patient currently is asymptomatic. He was given nitroglycerin in the ambulance and now feels more normal other than feeling tired. He does take Eliquis daily and diltiazem. He denies missing any doses. No recent travel, unilateral leg pain or swelling or history of PE. Patient has been under extreme amount of stress recently from work but otherwise no new medications or other changes.  Exam is without acute findings. EKG unchanged from prior with an incomplete right bundle branch block.  Troponin, CBC and CMP without acute findings. Low suspicion for PE or GI origin of his pain. Possible that patient had some paroxysmal atrial fibrillation that was causing the pain versus ACS. Discussed with cardiology and they will come and see the patient.  3:28 PM Cardiology evaluated and feel pt is ok for D/c  and outpt f/u.  Blanchie Dessert, MD 09/22/15 1443  Blanchie Dessert, MD 09/22/15 1529  Blanchie Dessert, MD 09/22/15 [id number]

## 2015-09-22 NOTE — Discharge Instructions (Signed)
Atrial Fibrillation °Atrial fibrillation is a type of heartbeat that is irregular or fast (rapid). If you have this condition, your heart keeps quivering in a weird (chaotic) way. This condition can make it so your heart cannot pump blood normally. Having this condition gives a person more risk for stroke, heart failure, and other heart problems. There are different types of atrial fibrillation. Talk with your doctor to learn about the type that you have. °HOME CARE °· Take over-the-counter and prescription medicines only as told by your doctor. °· If your doctor prescribed a blood-thinning medicine, take it exactly as told. Taking too much of it can cause bleeding. If you do not take enough of it, you will not have the protection that you need against stroke and other problems. °· Do not use any tobacco products. These include cigarettes, chewing tobacco, and e-cigarettes. If you need help quitting, ask your doctor. °· If you have apnea (obstructive sleep apnea), manage it as told by your doctor. °· Do not drink alcohol. °· Do not drink beverages that have caffeine. These include coffee, soda, and tea. °· Maintain a healthy weight. Do not use diet pills unless your doctor says they are safe for you. Diet pills may make heart problems worse. °· Follow diet instructions as told by your doctor. °· Exercise regularly as told by your doctor. °· Keep all follow-up visits as told by your doctor. This is important. °GET HELP IF: °· You notice a change in the speed, rhythm, or strength of your heartbeat. °· You are taking a blood-thinning medicine and you notice more bruising. °· You get tired more easily when you move or exercise. °GET HELP RIGHT AWAY IF: °· You have pain in your chest or your belly (abdomen). °· You have sweating or weakness. °· You feel sick to your stomach (nauseous). °· You notice blood in your throw up (vomit), poop (stool), or pee (urine). °· You are short of breath. °· You suddenly have swollen feet  and ankles. °· You feel dizzy. °· Your suddenly get weak or numb in your face, arms, or legs, especially if it happens on one side of your body. °· You have trouble talking, trouble understanding, or both. °· Your face or your eyelid droops on one side. °These symptoms may be an emergency. Do not wait to see if the symptoms will go away. Get medical help right away. Call your local emergency services (911 in the [place] Do not drive yourself to the hospital. °  °This information is not intended to replace advice given to you by your health care provider. Make sure you discuss any questions you have with your health care provider. °  °Document Released: 03/07/2008 Document Revised: 02/17/2015 Document Reviewed: 09/23/2014 °Elsevier Interactive Patient Education ©2016 [employer] ° °

## 2015-09-22 NOTE — ED Notes (Signed)
Pt verbalizes understanding of instructions. 

## 2015-09-22 NOTE — Consult Note (Signed)
CARDIOLOGY CONSULT NOTE   Patient ID: Jordan [name] MRN: [id number] DOB/AGE: [birth year] 70 y.o.  Admit date: 09/22/2015  Primary Physician   Redge Gainer, MD Primary Cardiologist: Dr. Marlou Porch Reason for Consultation: Chest pain   HPI: Jordan [name] is a 70 year old male with past medical history of CAD, PAF (on Eliquis), dyslipidemia, and BPH.    He reports feeling wiped out for the past week. He denies any cold or flu symptoms. This morning he felt tired and wasn't able to sleep well. He had one episode of loose stool. He went to work continued to feel tired. He could feel his heart racing. He took his blood pressure it was 148/80 and heart rate was 99. He denies feeling palpitations, but rather just that his heart was racing. This is consistent with how he feels when he has an episode of atrial fibrillation.  At 9:30 AM he began to feel chest tightness with no associated shortness of breath, no diaphoresis, no nausea and no vomiting. He did feel warm all over, at that time he took 325 mg of aspirin. He then called EMS. Upon arrival to the ED, he was given one sublingual nitroglycerin that relieved his chest tightness. His feelings of his heart racing also subsided at this time. Upon arrival to the ED, he was in normal sinus rhythm with a rate 61 bpm.  He reports extra stress at work in the past month. He has been feeling anxious, and not able to sleep at night. He has not chest pain or tightness recently with activity. He is minimally active, does not get regular exercise. 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  Of note recently he has noticed increased lower extremity edema especially at night. Denies shortness of breath with activity, orthopnea, and PND.  His last cath was in May 2002, and showed 95% stenosis in his LAD for which he received a stent. (Report below). He had Lexiscan Myoview in October 2011 there was no  ischemia, normal wall motion, and his EF was 70%. His last echo was in September 2014. At that time his EF was 60-65%, no wall motion abnormalities, diastolic function was normal (report below).    His troponin is negative 1. His chest x-ray shows no edema or pleural effusion.  His EKG shows normal sinus rhythm with incomplete right bundle branch block. Not concerning for ischemia.  He is currently chest pain-free.    Past Medical History  Diagnosis Date  . CAD (coronary artery disease)     Stent LAD, 2002  /  nuclear 2009, no ischemia  /    [facility] October, 2011 nuclear, no ischemia, possible slight apical scar, ejection fraction 70%  . Ejection fraction     EF 65-70%, echo, 2007 /  EF 70%, nuclear, 2011  . Atrial fibrillation (HCC)     Paroxysmal  . Warfarin anticoagulation     Low CHADS , score, but patient wants to be aggressive  . Drug intolerance     Mild beta blocker intolerance  . Incomplete RBBB   . Syncope     November, 2010  . Carotid bruit     Doppler November, 2011, normal  . Knee pain     November, 201  . BPH (benign prostatic hyperplasia)   . Back pain   . Anxiety     Mild  . Dyslipidemia   . Colon polyps   .  Palpitations   . IBS (irritable bowel syndrome)   . Personal history of colonic adenomas 11/29/2006    Qualifier: Diagnosis of  By: [place], [place]       Past Surgical History  Procedure Laterality Date  . Colonoscopy  multiple  . Back surgery  05/1990    L4L5S1  . Cardiac catheterization      Allergies  Allergen Reactions  . Levofloxacin Other (See Comments)    Couldn't breathe, passed out  . Lopid [Gemfibrozil] Other (See Comments)    Loose bowels  . Sulfonamide Derivatives Hives    I have reviewed the patient's current medications     Prior to Admission medications   Medication Sig Start Date End Date Taking? Authorizing Provider  apixaban (ELIQUIS) 5 MG TABS tablet Take 1 tablet (5 mg total) by mouth 2 (two) times daily. 10/26/14    Carlena Bjornstad, MD  atorvastatin (LIPITOR) 80 MG tablet Take 80 mg by mouth daily. Take 1/2 tablet daily    Historical Provider, MD  Calcium Carb-Cholecalciferol (CALCIUM 600+D3 PO) Take 1 capsule by mouth daily.     Historical Provider, MD  Cholecalciferol (VITAMIN D3) 1000 UNITS CAPS Take 1,000 Units by mouth daily.     Historical Provider, MD  diltiazem (CARDIZEM CD) 180 MG 24 hr capsule Take 1 capsule (180 mg total) by mouth daily. 05/25/15   Chipper Herb, MD  diltiazem (CARDIZEM) 60 MG tablet Take 1 tablet (60 mg total) by mouth as needed. 05/25/15   Chipper Herb, MD  fish oil-omega-3 fatty acids 1000 MG capsule Take 1 g by mouth daily.     Historical Provider, MD  Glucosamine-Chondroitin 1500-1200 MG/30ML LIQD Take 1,500 mg by mouth daily.     Historical Provider, MD  Multiple Vitamins-Minerals (MULTIVITAMIN WITH MINERALS) tablet Take 1 tablet by mouth daily.     Historical Provider, MD  nitroGLYCERIN (NITROSTAT) 0.4 MG SL tablet Place 1 tablet (0.4 mg total) under the tongue every 5 (five) minutes as needed for chest pain. 10/26/14   Carlena Bjornstad, MD     Social History   Social History  . Marital Status: Married    Spouse Name: N/A  . Number of Children: N/A  . Years of Education: N/A   Occupational History  . 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 Smoking status: Never Smoker   . Smokeless tobacco: Never Used  . Alcohol Use: No  . Drug Use: No  . Sexual Activity: Not on file   Other Topics Concern  . Not on file   Social History Narrative    Family Status  Relation Status Death Age  . Mother Deceased 38    heart attack  . Father Deceased 65    old age   Family History  Problem Relation Age of Onset  . Heart attack Mother   . Heart attack Brother      ROS:  Full 14 point review of systems complete and found to be negative unless listed above.  Physical Exam: Blood pressure 109/73, pulse 61, temperature 98 F  (36.7 C), temperature source Oral, resp. rate 17, height 6\' 2"  (1.88 m), weight 213 lb (96.616 kg), SpO2 100 %.  General: Well developed, well nourished, male in no acute distress Head: Eyes PERRLA, No xanthomas.   Normocephalic and atraumatic, oropharynx without edema or exudate. Dentition: Good Lungs: CTA Heart: HRRR S1 S2, no rub/gallop, No murmur. Pulses are  2+ extrem.   Neck: No carotid bruits. No lymphadenopathy.  No JVD. Abdomen: Bowel sounds present, abdomen soft and non-tender without masses or hernias noted. Msk:  No spine or cva tenderness. No weakness, no joint deformities or effusions. Extremities: No clubbing or cyanosis. +1 tibial edema BLE.   Neuro: Alert and oriented X 3. No focal deficits noted. Psych:  Good affect, responds appropriately Skin: No rashes or lesions noted.  Labs:   Lab Results  Component Value Date   WBC 8.7 09/22/2015   HGB 14.8 09/22/2015   HCT 43.7 09/22/2015   MCV 89.7 09/22/2015   PLT 212 09/22/2015    Recent Labs  09/22/15 1232  TROPIPOC 0.00     Echo: September 2014 - Left ventricle: The cavity size was normal. Wall thickness was normal. Systolic function was normal. The estimated ejection fraction was in the range of 60% to 65%. Wall motion was normal; there were no regional wall motion abnormalities. Left ventricular diastolic function parameters were normal. - Right ventricle: The cavity size was mildly dilated. - Right atrium: The atrium was mildly dilated.  ECG: NSR, incomplete RBBB.   Left heart cath May 2012: CORONARY ARTERIOGRAPHY: (Right dominant).  Left main: Left main is normal.  Left anterior descending: The left anterior descending artery has a 30% stenosis in the proximal vessel, 95% stenosis in the mid vessel followed by a 30% stenosis further down in the mid vessel. The distal LAD has a 30% stenosis. The LAD gives rise to a small first diagonal and normal sized second diagonal and small  third diagonal. The second diagonal has a 30% stenosis at its origin.  Left circumflex: The left circumflex has a 20% stenosis in the distal vessel. It gives rise to a small OM-1, a large OM-2 and a normal sized OM-3. OM-2 has a 25% stenosis proximally.  Right coronary artery: The right coronary artery is a dominant vessel. There are minor luminal irregularities in the proximal right coronary artery. The distal right coronary artery has a diffuse 25% stenosis. The right coronary artery gives rise to a large acute marginal branch, normal sized posterior descending artery and normal first posterolateral branch and a small second posterolateral branch.  IMPRESSIONS: 1. Normal left ventricular systolic function. 2. One-vessel coronary artery disease characterized by 95% stenosis in the  mid left anterior descending artery.  PLAN: Percutaneous intervention to the LAD.  ASSESSMENT AND PLAN:    Active Problems:   * No active hospital problems. *  1. Chest pain: Patient is currently chest pain-free. His pain was relieved with one sublingual nitroglycerin. We will cycle troponins and follow. He will most likely need outpatient nuclear stress test to further evaluate his symptoms.  His risk factors include history of coronary artery disease and hyperlipidemia however his last LDL was 55 in 2014. His symptoms could possibly be attributed to his feelings of tachycardia and his heart racing.  He states that he can feel his Afib when it occurs, and these feelings are very similar.  The chest tightness however is new and he has not recently experienced any chest tightness up until today.  Question whether stress plays a contributing factor here as patient reports increased stress at work recently.   2. Mild bilateral lower extremity edema: Patient reports mild bilateral lower extremity edema. He is on calcium channel blocker.  Edema resolves with elevation of his legs.   3. Paroxysmal  atrial fibrillation: Currently on Eliquis.  His This patients CHA2DS2-VASc Score and unadjusted  Ischemic Stroke Rate (% per year) is equal to 2.2 % stroke rate/year from a score of 2.   Above score calculated as 1 point each if present [CHF, HTN, DM, Vascular=MI/PAD/Aortic Plaque, Age if 65-74, or Male] Above score calculated as 2 points each if present [Age > 75, or Stroke/TIA/TE]  He states he was diagnosed with A. fib in 2003 and requested aggressive anticoagulation management at that time. Creatinine is within normal range. He is currently in normal sinus rhythm.  Signed: Arbutus Leas, NP 09/22/2015 1:11 PM Pager [phone]  Co-Sign MD  History and all data above reviewed.  Patient examined.  I agree with the findings as above.  Patient with one episode of chest pain.  This occurred when he thought he was in atrial fib.  No ischemia on EKG and initial troponin was negative.  Symptoms were brief.  He was able to ride an exercise bike two days ago without any symptoms.    The patient exam reveals COR:RRR  ,  Lungs: Clear  ,  Abd: Positive bowel sounds, no rebound no guarding, Ext No edema  .  All available labs, radiology testing, previous records reviewed. Agree with documented assessment and plan. Chest pain:  Somewhat atypical .   OK to discharge and we can plan an out patient POET (Plain Old Exercise Treadmill).  No change in therapy for atrial fib.  Follow with Dr. Marlou Porch.     Jeneen Rinks Aysha Livecchi  3:10 PM  09/22/2015

## 2015-09-22 NOTE — ED Notes (Signed)
Pt arrives EMS from home with c/o chest pain noted after waking this AM. Pt describes discomfort as tightness. States relieved by one nitro by EMS. Took 325 ASA at home. Denies SHOB, diaphoresis, nausea but states felt weak.

## 2015-10-01 ENCOUNTER — Telehealth: Payer: Self-pay | Admitting: Pharmacist

## 2015-10-01 NOTE — Telephone Encounter (Signed)
Patient called to ask about taking saw palmetto and using Arnica Gel and is either would interfere with Eliquis.  Both saw palmetto and Arnica Gel have slight anticoagulant properties per the [employer] Database.    Use of them with Eliquis could increase risk of bleeding.  I recommended he not take saw palmetto until he discussed with urologist and if he does will need to monitor for s/s of bleeding and CBC.  The Arnica Gel can be use sparingly for patient's knee pain (applied to area of pain as needed) but recommended no more than 48 hours and less than twice per month.

## 2015-10-04 ENCOUNTER — Encounter: Payer: Self-pay | Admitting: Physician Assistant

## 2015-10-05 ENCOUNTER — Ambulatory Visit (INDEPENDENT_AMBULATORY_CARE_PROVIDER_SITE_OTHER): Payer: Medicare Other

## 2015-10-05 DIAGNOSIS — R079 Chest pain, unspecified: Secondary | ICD-10-CM | POA: Diagnosis not present

## 2015-10-05 LAB — EXERCISE TOLERANCE TEST
CHL CUP MPHR: 152 {beats}/min
CHL CUP RESTING HR STRESS: 61 {beats}/min
CHL CUP STRESS STAGE 1 SPEED: 0 mph
CHL CUP STRESS STAGE 2 GRADE: 0 %
CHL CUP STRESS STAGE 2 HR: 80 {beats}/min
CHL CUP STRESS STAGE 2 SPEED: 1 mph
CHL CUP STRESS STAGE 3 GRADE: 0 %
CHL CUP STRESS STAGE 3 HR: 80 {beats}/min
CHL CUP STRESS STAGE 4 HR: 106 {beats}/min
CHL CUP STRESS STAGE 4 SBP: 139 mmHg
CHL CUP STRESS STAGE 5 DBP: 75 mmHg
CHL CUP STRESS STAGE 5 GRADE: 12 %
CHL CUP STRESS STAGE 5 SPEED: 2.5 mph
CHL CUP STRESS STAGE 6 SPEED: 3.4 mph
CHL CUP STRESS STAGE 7 GRADE: 14 %
CHL CUP STRESS STAGE 7 SPEED: 3.4 mph
CHL CUP STRESS STAGE 8 DBP: 86 mmHg
CHL CUP STRESS STAGE 8 GRADE: 0 %
CHL CUP STRESS STAGE 8 HR: 112 {beats}/min
CHL CUP STRESS STAGE 8 SBP: 162 mmHg
CHL CUP STRESS STAGE 9 DBP: 77 mmHg
CHL RATE OF PERCEIVED EXERTION: 17
CSEPED: 9 min
CSEPEDS: 0 s
CSEPEW: 10.1 METS
CSEPHR: 93 %
CSEPPHR: 142 {beats}/min
CSEPPMHR: 93 %
Stage 1 DBP: 72 mmHg
Stage 1 Grade: 0 %
Stage 1 HR: 74 {beats}/min
Stage 1 SBP: 118 mmHg
Stage 3 Speed: 1 mph
Stage 4 DBP: 73 mmHg
Stage 4 Grade: 10 %
Stage 4 Speed: 1.7 mph
Stage 5 HR: 122 {beats}/min
Stage 5 SBP: 168 mmHg
Stage 6 DBP: 93 mmHg
Stage 6 Grade: 14 %
Stage 6 HR: 142 {beats}/min
Stage 6 SBP: 196 mmHg
Stage 7 HR: 142 {beats}/min
Stage 8 Speed: 0 mph
Stage 9 Grade: 0 %
Stage 9 HR: 89 {beats}/min
Stage 9 SBP: 124 mmHg
Stage 9 Speed: 0 mph

## 2015-10-21 ENCOUNTER — Ambulatory Visit (INDEPENDENT_AMBULATORY_CARE_PROVIDER_SITE_OTHER): Payer: Medicare Other | Admitting: Physician Assistant

## 2015-10-21 ENCOUNTER — Encounter: Payer: Self-pay | Admitting: Physician Assistant

## 2015-10-21 VITALS — BP 108/62 | HR 58 | Ht 74.0 in | Wt 221.8 lb

## 2015-10-21 DIAGNOSIS — I480 Paroxysmal atrial fibrillation: Secondary | ICD-10-CM | POA: Diagnosis not present

## 2015-10-21 DIAGNOSIS — I2510 Atherosclerotic heart disease of native coronary artery without angina pectoris: Secondary | ICD-10-CM

## 2015-10-21 DIAGNOSIS — E785 Hyperlipidemia, unspecified: Secondary | ICD-10-CM | POA: Diagnosis not present

## 2015-10-21 NOTE — Patient Instructions (Addendum)
Medication Instructions:  Your physician recommends that you continue on your current medications as directed. Please refer to the Current Medication list given to you today. Labwork: None ordered Testing/Procedures: Non ordered Follow-Up: Follow up as planned in July 2017 with Dr.Skains Any Other Special Instructions Will Be Listed Below (If Applicable). If you need a refill on your cardiac medications before your next appointment, please call your pharmacy.

## 2015-10-21 NOTE — Progress Notes (Signed)
Cardiology Office Note:    Date:  10/21/2015   ID:  [name], DOB 02-08-1946, MRN [id number]  PCP:  Redge Gainer, MD  Cardiologist:  Dr. Candee Furbish   Electrophysiologist:  n/a  Referring MD: Chipper Herb, MD   Chief Complaint  Patient presents with  . Hospitalization Follow-up    Seen in ED with chest pain    History of Present Illness:     Jordan [name] is a 70 y.o. male with a hx of CAD, PAF, HL. He underwent stenting to the LAD in 2002. Previously followed by Dr. Ron Parker. Last seen by Dr. Marlou Porch 1/17.   Patient was seen in the emergency room 09/22/15 with complaints of malaise, chest tightness and diarrhea. He also noted palpitations. ECG demonstrated normal sinus rhythm. Outpatient exercise stress test was recommended. Patient had good exercise tolerance and there were no ischemic ST changes. He returns for follow-up.   Doing well.  Denies palpitations.  The patient denies any chest pain, significant dyspnea, syncope, orthopnea, PND, edema.   Past Medical History  Diagnosis Date  . CAD (coronary artery disease)     Stent LAD, 2002  /  nuclear 2009, no ischemia  /    [facility] October, 2011 nuclear, no ischemia, possible slight apical scar, ejection fraction 70%  . Ejection fraction     EF 65-70%, echo, 2007 /  EF 70%, nuclear, 2011  . Atrial fibrillation (HCC)     Paroxysmal  . Chronic anticoagulation     Low CHADS , score, but patient wants to be aggressive  . Drug intolerance     Mild beta blocker intolerance  . Incomplete RBBB   . Syncope     November, 2010  . Carotid bruit     Doppler November, 2011, normal  . Knee pain     November, 201  . BPH (benign prostatic hyperplasia)   . Anxiety     Mild  . Dyslipidemia   . Colon polyps   . IBS (irritable bowel syndrome)   . Personal history of colonic adenomas 11/29/2006    Qualifier: Diagnosis of  By: [place], [place]      Past Surgical History  Procedure Laterality Date  . Colonoscopy  multiple    . Back surgery  05/1990    [id number]  . Cardiac catheterization      Current Medications: Outpatient Prescriptions Prior to Visit  Medication Sig Dispense Refill  . apixaban (ELIQUIS) 5 MG TABS tablet Take 1 tablet (5 mg total) by mouth 2 (two) times daily. 60 tablet 6  . atorvastatin (LIPITOR) 80 MG tablet Take 40 mg by mouth daily. Take 1/2 tablet daily    . Calcium Carb-Cholecalciferol (CALCIUM 600+D3 PO) Take 1 capsule by mouth daily.     . carboxymethylcellulose (REFRESH PLUS) 0.5 % SOLN Place 1 drop into both eyes 3 (three) times daily as needed. For dry eyes    . Cholecalciferol (VITAMIN D3) 1000 UNITS CAPS Take 1,000 Units by mouth daily.     Marland Kitchen diltiazem (CARDIZEM CD) 180 MG 24 hr capsule Take 1 capsule (180 mg total) by mouth daily. 90 capsule 3  . fish oil-omega-3 fatty acids 1000 MG capsule Take 1 g by mouth daily.     . Glucosamine-Chondroitin 1500-1200 MG/30ML LIQD Take 1,500 mg by mouth daily.     . Multiple Vitamins-Minerals (MULTIVITAMIN WITH MINERALS) tablet Take 1 tablet by mouth daily.     . nitroGLYCERIN (NITROSTAT) 0.4 MG SL tablet Place  1 tablet (0.4 mg total) under the tongue every 5 (five) minutes as needed for chest pain. 25 tablet 3  . diltiazem (CARDIZEM) 60 MG tablet Take 1 tablet (60 mg total) by mouth as needed. (Patient not taking: Reported on 10/21/2015) 90 tablet 3   No facility-administered medications prior to visit.      Allergies:   Levofloxacin; Lopid; and Sulfonamide derivatives   Social History   Social History  . Marital Status: Married    Spouse Name: N/A  . Number of Children: N/A  . Years of Education: N/A   Occupational History  . 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 Smoking status: Never Smoker   . Smokeless tobacco: Never Used  . Alcohol Use: No  . Drug Use: No  . Sexual Activity: Not Asked   Other Topics Concern  . None   Social History Narrative     Family History:  The patient's  family history includes Heart attack in his brother and mother.   ROS:   Please see the history of present illness.    Review of Systems  Cardiovascular: Positive for leg swelling.   All other systems reviewed and are negative.   Physical Exam:    VS:  BP 108/62 mmHg  Pulse 58  Ht 6\' 2"  (1.88 m)  Wt 221 lb 12.8 oz (100.608 kg)  BMI 28.47 kg/m2   GEN: Well nourished, well developed, in no acute distress HEENT: normal Neck: no JVD, no masses Cardiac: Normal S1/S2, RRR; no murmurs, rubs, or gallops, trace -1+ bilat edema with chronic stasis changes;     Respiratory:  clear to auscultation bilaterally; no wheezing, rhonchi or rales GI: soft, nontender, nondistended MS: no deformity or atrophy Skin: warm and dry Neuro: No focal deficits  Psych: Alert and oriented x 3, normal affect  Wt Readings from Last 3 Encounters:  10/21/15 221 lb 12.8 oz (100.608 kg)  09/22/15 213 lb (96.616 kg)  06/24/15 223 lb 6.4 oz (101.334 kg)      Studies/Labs Reviewed:     EKG:  EKG is  ordered today.  The ekg ordered today demonstrates Sinus brady, HR 57, normal axis, inc RBBB, QTc 383 ms, no changes  Recent Labs: 09/22/2015: ALT 24; BUN 23*; Creatinine, Ser 1.00; Hemoglobin 14.8; Platelets 212; Potassium 4.1; Sodium 141   Recent Lipid Panel    Component Value Date/Time   CHOL 130 05/25/2015 0845   CHOL 125 03/06/2013 0821   TRIG 81 05/25/2015 0845   TRIG 73 03/06/2013 0821   TRIG 81 02/24/2013 0900   HDL 41 05/25/2015 0845   HDL 40 03/06/2013 0821   HDL 40 02/24/2013 0900   CHOLHDL 2.8 02/24/2013 0900   VLDL 16 02/24/2013 0900   LDLCALC 68 11/18/2013 0804   LDLCALC 70 03/06/2013 0821   LDLCALC 55 02/24/2013 0900    Additional studies/ records that were reviewed today include:   ETT 10/05/15 ETT with good exercise tolerance (9:00); no chest pain; normal BP response; no ST changes; negative adequate ETT.  Echo 02/24/13 EF 60-65%, normal wall motion, normal diastolic function, mild  RVE, mild RAE   Myoview 04/05/10 IMPRESSION: 1.  No reversible ischemia. Potential small apical scar versus apical thinning. 2.  Normal wall motion. 3.  Left ventricular ejection fraction equal 70%  LHC 5/02 LM normal LAD proximal 30%, mid 95%, 30%, distal 30%, D2 30% LCx distal 20%, OM 225% RCA distal 25% Normal  LV function PCI: BMS to the mid LAD   ASSESSMENT:     1. Coronary artery disease involving native coronary artery of native heart without angina pectoris   2. Paroxysmal atrial fibrillation (HCC)   3. hyperlipidemia     PLAN:     In order of problems listed above:  1. CAD - History of stenting to the LAD in 2002 and nonischemic Myoview in 2011. Recent trip to the emergency room with chest discomfort in setting of palpitations. He believes that he was back in atrial fibrillation at that time. Follow-up ETT low risk with no ischemic ST changes. He is not on aspirin as he is on Eliquis. Continue statin.  2. PAF - Currently in NSR. CHADS2-VASc=2.  Continue Eliquis at current dose. If he has more palpitations, we could consider monitoring and anti-arrhythmic drug therapy if AF burden is high.   3. HL - Managed by PCP. Continue statin.      Medication Adjustments/Labs and Tests Ordered: Current medicines are reviewed at length with the patient today.  Concerns regarding medicines are outlined above.  Medication changes, Labs and Tests ordered today are outlined in the Patient Instructions noted below. Patient Instructions  Medication Instructions:  Your physician recommends that you continue on your current medications as directed. Please refer to the Current Medication list given to you today. Labwork: None ordered Testing/Procedures: Non ordered Follow-Up: Follow up as planned in July 2017 with Dr.Skains Any Other Special Instructions Will Be Listed Below (If Applicable). If you need a refill on your cardiac medications before your next appointment, please call  your pharmacy.    Signed, Richardson Dopp, PA-C  10/21/2015 11:54 AM    [facility] [place], [place], [place]  [address] Phone: [phone]; Fax: [phone]

## 2015-11-01 ENCOUNTER — Other Ambulatory Visit: Payer: Self-pay | Admitting: Cardiology

## 2015-11-22 ENCOUNTER — Other Ambulatory Visit: Payer: Medicare Other

## 2015-11-22 DIAGNOSIS — R972 Elevated prostate specific antigen [PSA]: Secondary | ICD-10-CM

## 2015-11-22 DIAGNOSIS — N400 Enlarged prostate without lower urinary tract symptoms: Secondary | ICD-10-CM

## 2015-11-22 DIAGNOSIS — E559 Vitamin D deficiency, unspecified: Secondary | ICD-10-CM | POA: Diagnosis not present

## 2015-11-22 DIAGNOSIS — E785 Hyperlipidemia, unspecified: Secondary | ICD-10-CM

## 2015-11-22 DIAGNOSIS — I480 Paroxysmal atrial fibrillation: Secondary | ICD-10-CM

## 2015-11-23 ENCOUNTER — Encounter: Payer: Self-pay | Admitting: Internal Medicine

## 2015-11-23 ENCOUNTER — Ambulatory Visit (INDEPENDENT_AMBULATORY_CARE_PROVIDER_SITE_OTHER): Payer: Medicare Other | Admitting: Internal Medicine

## 2015-11-23 ENCOUNTER — Telehealth: Payer: Self-pay

## 2015-11-23 VITALS — BP 100/58 | HR 64 | Ht 74.0 in | Wt 221.0 lb

## 2015-11-23 DIAGNOSIS — I482 Chronic atrial fibrillation, unspecified: Secondary | ICD-10-CM

## 2015-11-23 DIAGNOSIS — K589 Irritable bowel syndrome without diarrhea: Secondary | ICD-10-CM

## 2015-11-23 DIAGNOSIS — Z7901 Long term (current) use of anticoagulants: Secondary | ICD-10-CM

## 2015-11-23 DIAGNOSIS — Z86010 Personal history of colonic polyps: Secondary | ICD-10-CM | POA: Diagnosis not present

## 2015-11-23 DIAGNOSIS — I2510 Atherosclerotic heart disease of native coronary artery without angina pectoris: Secondary | ICD-10-CM | POA: Diagnosis not present

## 2015-11-23 LAB — BMP8+EGFR
BUN/Creatinine Ratio: 21 (ref 10–24)
BUN: 21 mg/dL (ref 8–27)
CALCIUM: 8.7 mg/dL (ref 8.6–10.2)
CO2: 25 mmol/L (ref 18–29)
CREATININE: 1.01 mg/dL (ref 0.76–1.27)
Chloride: 103 mmol/L (ref 96–106)
GFR calc Af Amer: 88 mL/min/{1.73_m2} (ref >59)
GFR, EST NON AFRICAN AMERICAN: 76 mL/min/{1.73_m2} (ref >59)
Glucose: 86 mg/dL (ref 65–99)
Potassium: 4.2 mmol/L (ref 3.5–5.2)
Sodium: 143 mmol/L (ref 134–144)

## 2015-11-23 LAB — HEPATIC FUNCTION PANEL
ALT: 19 IU/L (ref 0–44)
AST: 22 IU/L (ref 0–40)
Albumin: 3.8 g/dL (ref 3.6–4.8)
Alkaline Phosphatase: 57 IU/L (ref 39–117)
BILIRUBIN TOTAL: 0.8 mg/dL (ref 0.0–1.2)
BILIRUBIN, DIRECT: 0.21 mg/dL (ref 0.00–0.40)
Total Protein: 6.1 g/dL (ref 6.0–8.5)

## 2015-11-23 LAB — CBC WITH DIFFERENTIAL/PLATELET
BASOS: 0 %
Basophils Absolute: 0 10*3/uL (ref 0.0–0.2)
EOS (ABSOLUTE): 0.2 10*3/uL (ref 0.0–0.4)
EOS: 3 %
HEMATOCRIT: 43.7 % (ref 37.5–51.0)
Hemoglobin: 14.7 g/dL (ref 12.6–17.7)
IMMATURE GRANS (ABS): 0 10*3/uL (ref 0.0–0.1)
IMMATURE GRANULOCYTES: 0 %
LYMPHS: 22 %
Lymphocytes Absolute: 1.7 10*3/uL (ref 0.7–3.1)
MCH: 30.2 pg (ref 26.6–33.0)
MCHC: 33.6 g/dL (ref 31.5–35.7)
MCV: 90 fL (ref 79–97)
MONOCYTES: 9 %
Monocytes Absolute: 0.7 10*3/uL (ref 0.1–0.9)
NEUTROS PCT: 66 %
Neutrophils Absolute: 4.9 10*3/uL (ref 1.4–7.0)
PLATELETS: 229 10*3/uL (ref 150–379)
RBC: 4.87 x10E6/uL (ref 4.14–5.80)
RDW: 13.2 % (ref 12.3–15.4)
WBC: 7.6 10*3/uL (ref 3.4–10.8)

## 2015-11-23 LAB — NMR, LIPOPROFILE
Cholesterol: 116 mg/dL (ref 100–199)
HDL Cholesterol by NMR: 32 mg/dL — ABNORMAL LOW (ref >39)
HDL Particle Number: 25.4 umol/L — ABNORMAL LOW (ref >=30.5)
LDL Particle Number: 730 nmol/L (ref <1000)
LDL SIZE: 20.4 nm (ref >20.5)
LDL-C: 66 mg/dL (ref 0–99)
LP-IR Score: 44 (ref <=45)
Small LDL Particle Number: 314 nmol/L (ref <=527)
Triglycerides by NMR: 91 mg/dL (ref 0–149)

## 2015-11-23 LAB — PSA, TOTAL AND FREE
PSA FREE: 2.19 ng/mL
PSA, Free Pct: 25.8 %
Prostate Specific Ag, Serum: 8.5 ng/mL — ABNORMAL HIGH (ref 0.0–4.0)

## 2015-11-23 LAB — VITAMIN D 25 HYDROXY (VIT D DEFICIENCY, FRACTURES): Vit D, 25-Hydroxy: 47.4 ng/mL (ref 30.0–100.0)

## 2015-11-23 NOTE — Patient Instructions (Addendum)
  Normal BMI (Body Mass Index- based on height and weight) is between 23 and 30. Your BMI today is Body mass index is 28.36 kg/(m^2). Marland Kitchen Please consider follow up  regarding your BMI with your Primary Care Provider.   You have been scheduled for a colonoscopy. Please follow written instructions given to you at your visit today.  Please pick up your prep supplies at the pharmacy. If you use inhalers (even only as needed), please bring them with you on the day of your procedure. Your physician has requested that you go to www.startemmi.com and enter the access code given to you at your visit today. This web site gives a general overview about your procedure. However, you should still follow specific instructions given to you by our office regarding your preparation for the procedure.  You will be contaced by our office prior to your procedure for directions on holding your Eliquis.  If you do not hear from our office 1 week prior to your scheduled procedure, please call [phone] to discuss.  Today you have been given a FODMAP to read and follow.  Take Align daily for a month. Sample and coupon provided.  I appreciate the opportunity to care for you. Silvano Rusk, MD, [facility]

## 2015-11-23 NOTE — Progress Notes (Signed)
   Subjective:    Patient ID: Jordan [name], male    DOB: Apr 01, 1946, 70 y.o.   MRN: [id number] Chief complaint: History of colon polyps, bloating and gas HPI The patient is here for follow-up regarding colonoscopy polyp surveillance, he saw on my chart where he is slightly overdue having had one in May 2014 with 6 adenomas. He has occasional bloating and gas and loose stools. Once or twice a week. Greek yogurt w/ granola and bluberries and flax seed for breakfast most days Imodium AD prn helps when he gets the diarrhea Trying to follow Mediterranean diet  Also will do green smoothies at times they seem to help Free range chickens  Medications, allergies, past medical history, past surgical history, family history and social history are reviewed and updated in the EMR.  Review of Systems As per history of present illness. No recent cardiac issues.    Objective:   Physical Exam @BP  100/58 mmHg  Pulse 64  Ht 6\' 2"  (1.88 m)  Wt 221 lb (100.245 kg)  BMI 28.36 kg/m2@  General:  NAD Eyes:   anicteric Lungs:  clear Heart:: S1S2 no rubs, murmurs or gallops Neuro: Alert and oriented 3    Data Reviewed:  Previous colonoscopy and pathology reports. As per history of present illness. January 2017 cardiology note    Assessment & Plan:   Encounter Diagnoses  Name Primary?  . IBS (irritable bowel syndrome) Yes  . History of adenomatous polyp of colon   . Long-term (current) use of anticoagulants   . Chronic atrial fibrillation (HCC)    For his irritable bowel syndrome he will consider taking align daily for one month and also will get a FODMAPS diet to see if he can restrict his diet some more though it sounds like he is doing a good job overall with that. His when necessary Imodium  is certainly reasonable to use.  He has a history of adenomatous colonic polyps and is at an appropriate time for a repeat surveillance colonoscopy. Since he takes Eliquis for atrial fibrillation  stroke prevention he is at increased risk of stroke though very rare coming off of that for a few days, as well as the usual risks of endoscopy.  We will double check electronically with his cardiologist Dr. Gypsy Balsam to see if he agrees with holding anticoagulation 1-2 days before colonoscopy and hopefully resuming the next day. He does have a low chads score of 2  I appreciate the opportunity to care for this patient. CC: Redge Gainer, MD

## 2015-11-23 NOTE — Telephone Encounter (Signed)
He may hold Eliquis 2 days prior to colonoscopy and resume day following colonoscopy.  Candee Furbish, MD

## 2015-11-23 NOTE — Telephone Encounter (Signed)
[facility] [address]  [place] [address]  11/23/2015   RE: Jordan [name] DOB: Dec 24, 1945 MRN: [id number]   Dear [name],    We have scheduled the above patient for an endoscopic procedure. Our records show that he is on anticoagulation therapy.   Please advise as to how long the patient may come off his therapy of Eliquist prior to the colonoscopy procedure, which is scheduled for 12/30/15.  Please fax back/ or route the completed form to [name] at [phone].   Sincerely,    Silvano Rusk, MD, [facility]

## 2015-11-24 NOTE — Telephone Encounter (Signed)
Left message to call back  

## 2015-11-25 ENCOUNTER — Encounter: Payer: Self-pay | Admitting: Family Medicine

## 2015-11-25 ENCOUNTER — Ambulatory Visit (INDEPENDENT_AMBULATORY_CARE_PROVIDER_SITE_OTHER): Payer: Medicare Other | Admitting: Family Medicine

## 2015-11-25 VITALS — BP 95/64 | HR 64 | Temp 97.1°F | Ht 74.0 in | Wt 217.0 lb

## 2015-11-25 DIAGNOSIS — Z86010 Personal history of colonic polyps: Secondary | ICD-10-CM | POA: Diagnosis not present

## 2015-11-25 DIAGNOSIS — I480 Paroxysmal atrial fibrillation: Secondary | ICD-10-CM

## 2015-11-25 DIAGNOSIS — E785 Hyperlipidemia, unspecified: Secondary | ICD-10-CM | POA: Diagnosis not present

## 2015-11-25 DIAGNOSIS — N400 Enlarged prostate without lower urinary tract symptoms: Secondary | ICD-10-CM

## 2015-11-25 DIAGNOSIS — I2510 Atherosclerotic heart disease of native coronary artery without angina pectoris: Secondary | ICD-10-CM | POA: Diagnosis not present

## 2015-11-25 NOTE — Progress Notes (Signed)
Subjective:    Patient ID: Jordan [name], male    DOB: [birth year], 70 y.o.   MRN: [id number]  HPI Pt here for follow up and management of chronic medical problems which includes hyperlipidemia. He is taking medications regularly.The patient is doing well today with no specific complaints. He is recently seen Dr. Carlean Purl, the gastroenterologist and is planning a colonoscopy in July. He's had recent lab work and this will be reviewed with him today. Is also followed regularly by Dr. Luther Parody the cardiologist. It is of note that h a strong numbers with advanced lipid testing have a total LDL particle number that remains good and at goal at 7:30 with an LDL C that is good at 66. Triglycerides are good at 91. The HDL particle number however is lower than it has been in the past at 25.4. Vitamin D level was good at 47.4. e had recent lab work and the PSA was elevated but less than it was 6 months ago. The PSA is now 8.5 in 6 months ago was 10.9 and as mentioned he has a urology appointment soon. The blood sugar and kidney function tests were good. The CBC has a normal white blood cell count and a stable hemoglobin with adequate platelets. All liver function tests were normal. In the past 3 months the patient has had a recent stress test and did well with this. He does have his colonoscopy planned in July. He denies any chest pain chest tightness or shortness of breath anymore than usual. He has occasional bouts of loose bowel movements and this is one of the reasons he is getting his colonoscopy but also because he had previous polyps. This is planned in July. He has not seen any blood in the stool or had any heartburn or indigestion. He's passing his water without problems and has his upcoming appointment with the urologist in August. He gets his eyes checked regularly. He does indicate that he doesn't have as much stamina when he is out working outside.     Patient Active Problem List   Diagnosis Date Noted   . Long term (current) use of anticoagulants 06/24/2015  . 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 Chest pain 02/23/2013  . CAD (coronary artery disease)   . Ejection fraction   . Atrial fibrillation ([place])   . Drug intolerance   . Incomplete RBBB   . Carotid bruit   . Knee pain   . Anxiety   . hyperlipidemia   . Palpitations   . BPH (benign prostatic hypertrophy) 02/06/2009  . Personal history of colonic adenomas 11/29/2006   Outpatient Encounter Prescriptions as of 11/25/2015  Medication Sig  . apixaban (ELIQUIS) 5 MG TABS tablet Take 1 tablet (5 mg total) by mouth 2 (two) times daily.  Marland Kitchen atorvastatin (LIPITOR) 80 MG tablet Take 40 mg by mouth daily. Take 1/2 tablet daily  . Calcium Carb-Cholecalciferol (CALCIUM 600+D3 PO) Take 1 capsule by mouth daily.   . carboxymethylcellulose (REFRESH PLUS) 0.5 % SOLN Place 1 drop into both eyes 3 (three) times daily as needed. For dry eyes  . Cholecalciferol (VITAMIN D3) 1000 UNITS CAPS Take 1,000 Units by mouth daily.   Marland Kitchen diltiazem (CARDIZEM CD) 180 MG 24 hr capsule Take 1 capsule (180 mg total) by mouth daily.  Marland Kitchen diltiazem (CARDIZEM) 60 MG tablet Take 60 mg by mouth daily as needed (FOR CONTROLLING  HEART RATE).  . fish oil-omega-3 fatty  acids 1000 MG capsule Take 1 g by mouth daily.   . Glucosamine-Chondroitin 1500-1200 MG/30ML LIQD Take 1,500 mg by mouth daily.   . Multiple Vitamins-Minerals (MULTIVITAMIN WITH MINERALS) tablet Take 1 tablet by mouth daily.   . nitroGLYCERIN (NITROSTAT) 0.4 MG SL tablet Place 1 tablet (0.4 mg total) under the tongue every 5 (five) minutes as needed for chest pain.  . Probiotic Product (ALIGN) 4 MG CAPS Take 1 capsule by mouth daily.   No facility-administered encounter medications on file as of 11/25/2015.      Review of Systems  Constitutional: Negative.   HENT: Negative.   Eyes: Negative.   Respiratory: Negative.   Cardiovascular:  Negative.   Gastrointestinal: Negative.   Endocrine: Negative.   Genitourinary: Negative.   Musculoskeletal: Negative.   Skin: Negative.   Allergic/Immunologic: Negative.   Neurological: Negative.   Hematological: Negative.   Psychiatric/Behavioral: Negative.        Objective:   Physical Exam  Constitutional: He is oriented to person, place, and time. He appears well-developed and well-nourished. No distress.  HENT:  Head: Normocephalic and atraumatic.  Right Ear: External ear normal.  Left Ear: External ear normal.  Nose: Nose normal.  Mouth/Throat: Oropharynx is clear and moist. No oropharyngeal exudate.  Eyes: Conjunctivae and EOM are normal. Pupils are equal, round, and reactive to light. Right eye exhibits no discharge. Left eye exhibits no discharge. No scleral icterus.  Neck: Normal range of motion. Neck supple. No thyromegaly present.  No bruits thyromegaly or anterior cervical adenopathy  Cardiovascular: Normal rate, regular rhythm, normal heart sounds and intact distal pulses.   No murmur heard. The heart is regular at 72/m  Pulmonary/Chest: Effort normal and breath sounds normal. No respiratory distress. He has no wheezes. He has no rales. He exhibits no tenderness.  Clear anteriorly and posteriorly  Abdominal: Soft. Bowel sounds are normal. He exhibits no mass. There is no tenderness. There is no rebound and no guarding.  Genitourinary:  The patient will be seeing the urologist in August  Musculoskeletal: Normal range of motion. He exhibits no edema or tenderness.  Nonpitting edema and ankles and feet with varicosities  Lymphadenopathy:    He has no cervical adenopathy.  Neurological: He is alert and oriented to person, place, and time. He has normal reflexes. No cranial nerve deficit.  Skin: Skin is warm and dry. No rash noted.  Psychiatric: He has a normal mood and affect. His behavior is normal. Judgment and thought content normal.  Nursing note and vitals  reviewed.  BP 95/64 mmHg  Pulse 64  Temp(Src) 97.1 F (36.2 C) (Oral)  Ht 6\' 2"  (1.88 m)  Wt 217 lb (98.431 kg)  BMI 27.85 kg/m2        Assessment & Plan:  1. hyperlipidemia -All cholesterol numbers were excellent and the patient will continue with current treatment. We will recheck the HDL particle number and 6 months and if it remains low we'll consider retrying Niaspan.  2. BPH (benign prostatic hypertrophy) -Follow-up with urology in August as planned  3. Paroxysmal atrial fibrillation (HCC) -Follow-up with cardiology  4. Coronary artery disease involving native coronary artery of native heart without angina pectoris -Continue with cardiology  5. Personal history of colonic adenomas -Colonoscopy planned for July  Patient Instructions                       Medicare Annual Wellness Visit  [facility] and the medical providers at [facility] strive to bring you the best medical care.  In doing so we not only want to address your current medical conditions and concerns but also to detect new conditions early and prevent illness, disease and health-related problems.    Medicare offers a yearly Wellness Visit which allows our clinical staff to assess your need for preventative services including immunizations, lifestyle education, counseling to decrease risk of preventable diseases and screening for fall risk and other medical concerns.    This visit is provided free of charge (no copay) for all Medicare recipients. The clinical pharmacists at [facility] have begun to conduct these Wellness Visits which will also include a thorough review of all your medications.    As you primary medical provider recommend that you make an appointment for your Annual Wellness Visit if you have not done so already this year.  You may set up this appointment before you leave today or you may call back [phone]) and schedule an appointment.  Please  make sure when you call that you mention that you are scheduling your Annual Wellness Visit with the clinical pharmacist so that the appointment may be made for the proper length of time.     Continue current medications. Continue good therapeutic lifestyle changes which include good diet and exercise. Fall precautions discussed with patient. If an FOBT was given today- please return it to our front desk. If you are over 47 years old - you may need Prevnar 19 or the adult Pneumonia vaccine.  **Flu shots are available--- please call and schedule a FLU-CLINIC appointment**  After your visit with [place] today you will receive a survey in the mail or online from [employer] regarding your care with [place]. Please take a moment to fill this out. Your feedback is very important to [place] as you can help [place] better understand your patient needs as well as improve your experience and satisfaction. WE CARE ABOUT YOU!!!   Drink plenty of fluids and stay well hydrated When working outside in the yard and getting heated more than usual check blood pressures and see if they are even lower and this may explain some of your decreased stamina. Follow-up appointments with gastroenterology urology and cardiology   Arrie Senate MD

## 2015-11-25 NOTE — Patient Instructions (Addendum)
Medicare Annual Wellness Visit  [facility] and the medical providers at [facility] strive to bring you the best medical care.  In doing so we not only want to address your current medical conditions and concerns but also to detect new conditions early and prevent illness, disease and health-related problems.    Medicare offers a yearly Wellness Visit which allows our clinical staff to assess your need for preventative services including immunizations, lifestyle education, counseling to decrease risk of preventable diseases and screening for fall risk and other medical concerns.    This visit is provided free of charge (no copay) for all Medicare recipients. The clinical pharmacists at [facility] have begun to conduct these Wellness Visits which will also include a thorough review of all your medications.    As you primary medical provider recommend that you make an appointment for your Annual Wellness Visit if you have not done so already this year.  You may set up this appointment before you leave today or you may call back [id number]) and schedule an appointment.  Please make sure when you call that you mention that you are scheduling your Annual Wellness Visit with the clinical pharmacist so that the appointment may be made for the proper length of time.     Continue current medications. Continue good therapeutic lifestyle changes which include good diet and exercise. Fall precautions discussed with patient. If an FOBT was given today- please return it to our front desk. If you are over 80 years old - you may need Prevnar 62 or the adult Pneumonia vaccine.  **Flu shots are available--- please call and schedule a FLU-CLINIC appointment**  After your visit with [place] today you will receive a survey in the mail or online from [employer] regarding your care with [place]. Please take a moment to fill this out. Your feedback is very  important to [place] as you can help [place] better understand your patient needs as well as improve your experience and satisfaction. WE CARE ABOUT YOU!!!   Drink plenty of fluids and stay well hydrated When working outside in the yard and getting heated more than usual check blood pressures and see if they are even lower and this may explain some of your decreased stamina. Follow-up appointments with gastroenterology urology and cardiology

## 2015-12-01 NOTE — Telephone Encounter (Signed)
Spoke with wife [name] as [facility] was unavailable, informed her of the time he is to hold his Eliquis and she wrote it down and verbalized understanding.

## 2015-12-23 ENCOUNTER — Ambulatory Visit (INDEPENDENT_AMBULATORY_CARE_PROVIDER_SITE_OTHER): Payer: Medicare Other | Admitting: Cardiology

## 2015-12-23 ENCOUNTER — Encounter: Payer: Self-pay | Admitting: Cardiology

## 2015-12-23 VITALS — BP 112/66 | HR 70 | Ht 74.0 in | Wt 220.4 lb

## 2015-12-23 DIAGNOSIS — I2510 Atherosclerotic heart disease of native coronary artery without angina pectoris: Secondary | ICD-10-CM

## 2015-12-23 DIAGNOSIS — I480 Paroxysmal atrial fibrillation: Secondary | ICD-10-CM

## 2015-12-23 DIAGNOSIS — E785 Hyperlipidemia, unspecified: Secondary | ICD-10-CM | POA: Diagnosis not present

## 2015-12-23 DIAGNOSIS — Z7901 Long term (current) use of anticoagulants: Secondary | ICD-10-CM | POA: Diagnosis not present

## 2015-12-23 MED ORDER — NITROGLYCERIN 0.4 MG SL SUBL: 0.4000 mg | SUBLINGUAL_TABLET | SUBLINGUAL | Status: DC | PRN

## 2015-12-23 MED ORDER — FAMOTIDINE 20 MG PO TABS: 20.0000 mg | ORAL_TABLET | Freq: Two times a day (BID) | ORAL | Status: DC

## 2015-12-23 NOTE — Patient Instructions (Signed)
Medication Instructions:  The current medical regimen is effective;  continue present plan and medications. You may take Pepcid 20 mg a day. You may hold Eliquis Tuesday and Wednesday before your colonoscopy.  Restart on Thursday after your procedure.  Follow-Up: Follow up in 6 months with Richardson Dopp.  You will receive a letter in the mail 2 months before you are due.  Please call us when you receive this letter to schedule your follow up appointment. Follow up in 1 year with Dr. Marlou Porch.  You will receive a letter in the mail 2 months before you are due.  Please call us when you receive this letter to schedule your follow up appointment.  If you need a refill on your cardiac medications before your next appointment, please call your pharmacy.  Thank you for choosing [facility]!!

## 2015-12-23 NOTE — Progress Notes (Signed)
Cardiology Office Note   Date:  12/23/2015   ID:  Jordan [name], DOB 01/02/1946, MRN [id number]  PCP:  Redge Gainer, MD  Cardiologist:   Candee Furbish, MD (Former Jordan [name])      History of Present Illness: Jordan [name] is a 70 y.o. male who presents for follow-up of coronary artery disease, paroxysmal age fibrillation. 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  No chest pain, no shortness of breath, no orthopnea, no PND.  Stent in 2002 to the LAD, nuclear in 2011 low risk. Possible slight apical scar, EF 70%.  Prior bout of syncope in 2010.  No bleeding issues with anticoagulation.  Right knee pain. Dr. Jaynee [name]. Meniscal tear. Physical therapy.   He takes his grandchildren on a ski trip every year. [place]  Having some heart burn after eating. Green tea. Still working.   Colonoscopy - hold Eliquis 2 days prior.  Past Medical History  Diagnosis Date  . CAD (coronary artery disease)     Stent LAD, 2002  /  nuclear 2009, no ischemia  /    [facility] October, 2011 nuclear, no ischemia, possible slight apical scar, ejection fraction 70%  . Ejection fraction     EF 65-70%, echo, 2007 /  EF 70%, nuclear, 2011  . Atrial fibrillation (HCC)     Paroxysmal  . Chronic anticoagulation     Low CHADS , score, but patient wants to be aggressive  . Drug intolerance     Mild beta blocker intolerance  . Incomplete RBBB   . Syncope     November, 2010  . Carotid bruit     Doppler November, 2011, normal  . Knee pain     November, 201  . BPH (benign prostatic hyperplasia)   . Anxiety     Mild  . Dyslipidemia   . Colon polyps   . IBS (irritable bowel syndrome)   . Personal history of colonic adenomas 11/29/2006    Qualifier: Diagnosis of  By: Jordan [name], [place]      Past Surgical History  Procedure Laterality Date  . Colonoscopy   multiple  . Back surgery  05/1990    [id number]  . Cardiac catheterization       Current Outpatient Prescriptions  Medication Sig Dispense Refill  . apixaban (ELIQUIS) 5 MG TABS tablet Take 1 tablet (5 mg total) by mouth 2 (two) times daily. 60 tablet 11  . atorvastatin (LIPITOR) 80 MG tablet Take 40 mg by mouth daily. Take 1/2 tablet daily    . Calcium Carb-Cholecalciferol (CALCIUM 600+D3 PO) Take 1 capsule by mouth daily.     . carboxymethylcellulose (REFRESH PLUS) 0.5 % SOLN Place 1 drop into both eyes 3 (three) times daily as needed. For dry eyes    . Cholecalciferol (VITAMIN D3) 1000 UNITS CAPS Take 1,000 Units by mouth daily.     Marland Kitchen diltiazem (CARDIZEM CD) 180 MG 24 hr capsule Take 1 capsule (180 mg total) by mouth daily. 90 capsule 3  . diltiazem (CARDIZEM) 60 MG tablet Take 60 mg by mouth daily as needed (FOR CONTROLLING  HEART RATE).    . fish oil-omega-3 fatty acids 1000 MG capsule Take 1 g by mouth daily.     . Glucosamine-Chondroitin 1500-1200 MG/30ML LIQD Take 1,500 mg by mouth daily.     . Multiple Vitamins-Minerals (MULTIVITAMIN  WITH MINERALS) tablet Take 1 tablet by mouth daily.     . nitroGLYCERIN (NITROSTAT) 0.4 MG SL tablet Place 1 tablet (0.4 mg total) under the tongue every 5 (five) minutes as needed for chest pain. 25 tablet 3  . Probiotic Product (ALIGN) 4 MG CAPS Take 1 capsule by mouth daily.    . famotidine (PEPCID) 20 MG tablet Take 1 tablet (20 mg total) by mouth 2 (two) times daily.     No current facility-administered medications for this visit.    Allergies:   Levofloxacin; Lopid; and Sulfonamide derivatives    Social History:  The patient  reports that he has never smoked. He has never used smokeless tobacco. He reports that he does not drink alcohol or use illicit drugs.   Family History:  The patient's family history includes Heart attack in his brother and mother.   Mother - 49 MI Father - 36 died  ROS:  Please see the history of present illness.    Otherwise, review of systems are positive for none.   All other systems are reviewed and negative.    PHYSICAL EXAM: VS:  BP 112/66 mmHg  Pulse 70  Ht 6\' 2"  (1.88 m)  Wt 220 lb 6.4 oz (99.973 kg)  BMI 28.29 kg/m2 , BMI Body mass index is 28.29 kg/(m^2). GEN: Well nourished, well developed, in no acute distress HEENT: normal Neck: no JVD,  very soft carotid bruits, or masses Cardiac: RRR; no murmurs, rubs, or gallops,no edema  Respiratory:  clear to auscultation bilaterally, normal work of breathing GI: soft, nontender, nondistended, + BS MS: no deformity or atrophy Skin: warm and dry, no rash Neuro:  Strength and sensation are intact Psych: euthymic mood, full affect   EKG: Prior reviewed, sinus bradycardia, nonspecific ST-T wave changes no significant change from prior.  ETT - 10/05/15 - low risk. 40min.   Recent Labs: 09/22/2015: Hemoglobin 14.8 11/22/2015: ALT 19; BUN 21; Creatinine, Ser 1.01; Platelets 229; Potassium 4.2; Sodium 143    Lipid Panel    Component Value Date/Time   CHOL 116 11/22/2015 1019   CHOL 125 03/06/2013 0821   TRIG 91 11/22/2015 1019   TRIG 73 03/06/2013 0821   TRIG 81 02/24/2013 0900   HDL 32* 11/22/2015 1019   HDL 40 03/06/2013 0821   HDL 40 02/24/2013 0900   CHOLHDL 2.8 02/24/2013 0900   VLDL 16 02/24/2013 0900   LDLCALC 68 11/18/2013 0804   LDLCALC 70 03/06/2013 0821   LDLCALC 55 02/24/2013 0900      Wt Readings from Last 3 Encounters:  12/23/15 220 lb 6.4 oz (99.973 kg)  11/25/15 217 lb (98.431 kg)  11/23/15 221 lb (100.245 kg)      Other studies Reviewed: Additional studies/ records that were reviewed today include: Prior office notes, lab, EKG, nuclear stress test Review of the above records demonstrates: As above   ASSESSMENT AND PLAN:  Coronary artery disease  - LAD stent in 2002  - Nuclear stress test 2011 low risk, no ischemia, small apical defect  - Normal EF  - Aggressive secondary prevention  - No aspirin secondary  to Eliquis  - Reassuring exercise treadmill test in April 2017, low risk, 9 minutes, no ischemic changes.  Paroxysmal atrial fibrillation  - Requested aggressive anticoagulation management  - CHADS VASc - 2, age, CAD  - Continue Eliquis, discussed benefits  - Lab work reviewed, creatinine 1.08, Hemoglobin normal  Chronic anticoagulation  - Eliquis  - He may hold it for  2 days prior to colonoscopy  - No bleeding  Carotid bruit  - Prior Doppler normal 2011.  - Barely able to appreciate today on exam  Hyperlipidemia  - Lab work reviewed as above. No change in therapy.  - In the past, used to take niacin. We discussed  Right knee pain  - Meniscal tear   Current medicines are reviewed at length with the patient today.  The patient does not have concerns regarding medicines.  The following changes have been made:  no change  Labs/ tests ordered today include: none  No orders of the defined types were placed in this encounter.     Disposition:   FU with Kathlen Mody in 6 months me in one year  Signed, Candee Furbish, MD  12/23/2015 9:14 AM    [facility] [address], [place], [place]  [address] Phone: [phone]; Fax: [phone]

## 2015-12-25 DIAGNOSIS — H04123 Dry eye syndrome of bilateral lacrimal glands: Secondary | ICD-10-CM | POA: Diagnosis not present

## 2015-12-25 DIAGNOSIS — H40033 Anatomical narrow angle, bilateral: Secondary | ICD-10-CM | POA: Diagnosis not present

## 2015-12-30 ENCOUNTER — Encounter: Payer: Self-pay | Admitting: Internal Medicine

## 2015-12-30 ENCOUNTER — Ambulatory Visit (AMBULATORY_SURGERY_CENTER): Payer: Medicare Other | Admitting: Internal Medicine

## 2015-12-30 VITALS — BP 104/66 | HR 60 | Temp 98.7°F | Resp 13 | Ht 74.0 in | Wt 221.0 lb

## 2015-12-30 DIAGNOSIS — Z86010 Personal history of colonic polyps: Secondary | ICD-10-CM | POA: Diagnosis not present

## 2015-12-30 DIAGNOSIS — F419 Anxiety disorder, unspecified: Secondary | ICD-10-CM | POA: Diagnosis not present

## 2015-12-30 DIAGNOSIS — N400 Enlarged prostate without lower urinary tract symptoms: Secondary | ICD-10-CM | POA: Diagnosis not present

## 2015-12-30 DIAGNOSIS — I4891 Unspecified atrial fibrillation: Secondary | ICD-10-CM | POA: Diagnosis not present

## 2015-12-30 DIAGNOSIS — K625 Hemorrhage of anus and rectum: Secondary | ICD-10-CM | POA: Diagnosis not present

## 2015-12-30 DIAGNOSIS — D122 Benign neoplasm of ascending colon: Secondary | ICD-10-CM

## 2015-12-30 MED ORDER — SODIUM CHLORIDE 0.9 % IV SOLN: 500.0000 mL | INTRAVENOUS | Status: DC

## 2015-12-30 NOTE — Patient Instructions (Addendum)
I found and removed one polyp. I will let you know pathology results and when to have another routine colonoscopy by mail.  OK to stop the Align as we discussed.  I appreciate the opportunity to care for you. Gatha [place], MD, FACG   YOU HAD AN ENDOSCOPIC PROCEDURE TODAY AT [facility]:   Refer to the procedure report that was given to you for any specific questions about what was found during the examination.  If the procedure report does not answer your questions, please call your gastroenterologist to clarify.  If you requested that your care partner not be given the details of your procedure findings, then the procedure report has been included in a sealed envelope for you to review at your convenience later.  YOU SHOULD EXPECT: Some feelings of bloating in the abdomen. Passage of more gas than usual.  Walking can help get rid of the air that was put into your GI tract during the procedure and reduce the bloating. If you had a lower endoscopy (such as a colonoscopy or flexible sigmoidoscopy) you may notice spotting of blood in your stool or on the toilet paper. If you underwent a bowel prep for your procedure, you may not have a normal bowel movement for a few days.  Please Note:  You might notice some irritation and congestion in your nose or some drainage.  This is from the oxygen used during your procedure.  There is no need for concern and it should clear up in a day or so.  SYMPTOMS TO REPORT IMMEDIATELY:   Following lower endoscopy (colonoscopy or flexible sigmoidoscopy):  Excessive amounts of blood in the stool  Significant tenderness or worsening of abdominal pains  Swelling of the abdomen that is new, acute  Fever of 100F or higher  For urgent or emergent issues, a gastroenterologist can be reached at any hour by calling [phone].   DIET: Your first meal following the procedure should be a small meal and then it is ok to progress to your  normal diet. Heavy or fried foods are harder to digest and may make you feel nauseous or bloated.  Likewise, meals heavy in dairy and vegetables can increase bloating.  Drink plenty of fluids but you should avoid alcoholic beverages for 24 hours.  ACTIVITY:  You should plan to take it easy for the rest of today and you should NOT DRIVE or use heavy machinery until tomorrow (because of the sedation medicines used during the test).    Please read all handouts given to you today. Resume Eliquis at prior dose today.  FOLLOW UP: Our staff will call the number listed on your records the next business day following your procedure to check on you and address any questions or concerns that you may have regarding the information given to you following your procedure. If we do not reach you, we will leave a message.  However, if you are feeling well and you are not experiencing any problems, there is no need to return our call.  We will assume that you have returned to your regular daily activities without incident.  If any biopsies were taken you will be contacted by phone or by letter within the next 1-3 weeks.  Please call us at [phone] if you have not heard about the biopsies in 3 weeks.    SIGNATURES/CONFIDENTIALITY: You and/or your care partner have signed paperwork which will be entered into your electronic medical record.  These  signatures attest to the fact that that the information above on your After Visit Summary has been reviewed and is understood.  Full responsibility of the confidentiality of this discharge information lies with you and/or your care-partner.  Thank you for letting us take care of your healthcare needs today.

## 2015-12-30 NOTE — Progress Notes (Signed)
To pacu vss patent aw rerot to rn

## 2015-12-30 NOTE — Progress Notes (Signed)
Called to room to assist during endoscopic procedure.  Patient ID and intended procedure confirmed with present staff. Received instructions for my participation in the procedure from the performing physician.  

## 2015-12-30 NOTE — Op Note (Signed)
[facility] Patient Name: Jordan [name] Procedure Date: 12/30/2015 1:05 PM MRN: [id number] Endoscopist: Gatha [place] , MD Age: 70 Referring MD:  Date of Birth: Dec 25, 1945 Gender: Male Account #: [id number] Procedure:                Colonoscopy Indications:              Surveillance: Personal history of adenomatous                            polyps on last colonoscopy 3 years ago Medicines:                Monitored Anesthesia Care Procedure:                Pre-Anesthesia Assessment:                           - Prior to the procedure, a History and Physical                            was performed, and patient medications and                            allergies were reviewed. The patient's tolerance of                            previous anesthesia was also reviewed. The risks                            and benefits of the procedure and the sedation                            options and risks were discussed with the patient.                            All questions were answered, and informed consent                            was obtained. Prior Anticoagulants: The patient                            last took Eliquis (apixaban) 2 days prior to the                            procedure. ASA Grade Assessment: III - A patient                            with severe systemic disease. After reviewing the                            risks and benefits, the patient was deemed in                            satisfactory condition to undergo the procedure.  After obtaining informed consent, the colonoscope                            was passed under direct vision. Throughout the                            procedure, the patient's blood pressure, pulse, and                            oxygen saturations were monitored continuously. The                            Model CF-HQ190L [id number]) scope was introduced                            through the anus and  advanced to the the cecum,                            identified by appendiceal orifice and ileocecal                            valve. The colonoscopy was performed without                            difficulty. The patient tolerated the procedure                            well. The quality of the bowel preparation was                            excellent. The ileocecal valve, appendiceal                            orifice, and rectum were photographed. The bowel                            preparation used was Miralax. Scope In: 1:35:48 PM Scope Out: 1:50:12 PM Scope Withdrawal Time: 0 hours 11 minutes 5 seconds  Total Procedure Duration: 0 hours 14 minutes 24 seconds  Findings:                 The perianal and digital rectal examinations were                            normal. Pertinent negatives include normal prostate                            (size, shape, and consistency).                           A 6 mm polyp was found in the ascending colon. The                            polyp was sessile. The polyp was removed with a  cold snare. Resection and retrieval were complete.                            Verification of patient identification for the                            specimen was done. Estimated blood loss was minimal.                           The exam was otherwise normal throughout the                            examined colon.                           No additional abnormalities were found on                            retroflexion. Complications:            No immediate complications. Estimated Blood Loss:     Estimated blood loss was minimal. Impression:               - One 6 mm polyp in the ascending colon, removed                            with a cold snare. Resected and retrieved.                           - Personal history of colonic polyps. Recommendation:           - Patient has a contact number available for                             emergencies. The signs and symptoms of potential                            delayed complications were discussed with the                            patient. Return to normal activities tomorrow.                            Written discharge instructions were provided to the                            patient.                           - Resume previous diet.                           - Resume Eliquis (apixaban) at prior dose today.                           - Repeat colonoscopy is recommended for  surveillance. The colonoscopy date will be                            determined after pathology results from today's                            exam become available for review.                           - Continue present medications. Gatha [place], MD 12/30/2015 1:58:12 PM This report has been signed electronically.

## 2015-12-31 ENCOUNTER — Telehealth: Payer: Self-pay | Admitting: *Deleted

## 2015-12-31 NOTE — Telephone Encounter (Signed)
No answer, left message to call if questions or concerns. 

## 2016-01-06 NOTE — Progress Notes (Signed)
6 mm adenoma - My Chart message sent re: 5 year recall No letter

## 2016-01-07 DIAGNOSIS — M9901 Segmental and somatic dysfunction of cervical region: Secondary | ICD-10-CM | POA: Diagnosis not present

## 2016-01-07 DIAGNOSIS — S335XXA Sprain of ligaments of lumbar spine, initial encounter: Secondary | ICD-10-CM | POA: Diagnosis not present

## 2016-01-07 DIAGNOSIS — M9902 Segmental and somatic dysfunction of thoracic region: Secondary | ICD-10-CM | POA: Diagnosis not present

## 2016-01-07 DIAGNOSIS — S233XXA Sprain of ligaments of thoracic spine, initial encounter: Secondary | ICD-10-CM | POA: Diagnosis not present

## 2016-01-07 DIAGNOSIS — M546 Pain in thoracic spine: Secondary | ICD-10-CM | POA: Diagnosis not present

## 2016-01-07 DIAGNOSIS — M47812 Spondylosis without myelopathy or radiculopathy, cervical region: Secondary | ICD-10-CM | POA: Diagnosis not present

## 2016-01-07 DIAGNOSIS — M9903 Segmental and somatic dysfunction of lumbar region: Secondary | ICD-10-CM | POA: Diagnosis not present

## 2016-01-10 DIAGNOSIS — S335XXA Sprain of ligaments of lumbar spine, initial encounter: Secondary | ICD-10-CM | POA: Diagnosis not present

## 2016-01-10 DIAGNOSIS — M47812 Spondylosis without myelopathy or radiculopathy, cervical region: Secondary | ICD-10-CM | POA: Diagnosis not present

## 2016-01-10 DIAGNOSIS — M9902 Segmental and somatic dysfunction of thoracic region: Secondary | ICD-10-CM | POA: Diagnosis not present

## 2016-01-10 DIAGNOSIS — M9901 Segmental and somatic dysfunction of cervical region: Secondary | ICD-10-CM | POA: Diagnosis not present

## 2016-01-10 DIAGNOSIS — M9903 Segmental and somatic dysfunction of lumbar region: Secondary | ICD-10-CM | POA: Diagnosis not present

## 2016-01-10 DIAGNOSIS — S233XXA Sprain of ligaments of thoracic spine, initial encounter: Secondary | ICD-10-CM | POA: Diagnosis not present

## 2016-01-10 DIAGNOSIS — M546 Pain in thoracic spine: Secondary | ICD-10-CM | POA: Diagnosis not present

## 2016-01-12 DIAGNOSIS — R972 Elevated prostate specific antigen [PSA]: Secondary | ICD-10-CM | POA: Diagnosis not present

## 2016-01-17 DIAGNOSIS — N3941 Urge incontinence: Secondary | ICD-10-CM | POA: Diagnosis not present

## 2016-01-17 DIAGNOSIS — N401 Enlarged prostate with lower urinary tract symptoms: Secondary | ICD-10-CM | POA: Diagnosis not present

## 2016-01-17 DIAGNOSIS — R972 Elevated prostate specific antigen [PSA]: Secondary | ICD-10-CM | POA: Diagnosis not present

## 2016-03-16 ENCOUNTER — Other Ambulatory Visit: Payer: Self-pay | Admitting: Family Medicine

## 2016-03-25 DIAGNOSIS — Z23 Encounter for immunization: Secondary | ICD-10-CM | POA: Diagnosis not present

## 2016-04-28 DIAGNOSIS — M546 Pain in thoracic spine: Secondary | ICD-10-CM | POA: Diagnosis not present

## 2016-04-28 DIAGNOSIS — M47812 Spondylosis without myelopathy or radiculopathy, cervical region: Secondary | ICD-10-CM | POA: Diagnosis not present

## 2016-04-28 DIAGNOSIS — M9902 Segmental and somatic dysfunction of thoracic region: Secondary | ICD-10-CM | POA: Diagnosis not present

## 2016-04-28 DIAGNOSIS — M9903 Segmental and somatic dysfunction of lumbar region: Secondary | ICD-10-CM | POA: Diagnosis not present

## 2016-04-28 DIAGNOSIS — S233XXA Sprain of ligaments of thoracic spine, initial encounter: Secondary | ICD-10-CM | POA: Diagnosis not present

## 2016-04-28 DIAGNOSIS — S335XXA Sprain of ligaments of lumbar spine, initial encounter: Secondary | ICD-10-CM | POA: Diagnosis not present

## 2016-04-28 DIAGNOSIS — M9901 Segmental and somatic dysfunction of cervical region: Secondary | ICD-10-CM | POA: Diagnosis not present

## 2016-05-01 DIAGNOSIS — M9902 Segmental and somatic dysfunction of thoracic region: Secondary | ICD-10-CM | POA: Diagnosis not present

## 2016-05-01 DIAGNOSIS — M9903 Segmental and somatic dysfunction of lumbar region: Secondary | ICD-10-CM | POA: Diagnosis not present

## 2016-05-01 DIAGNOSIS — S335XXA Sprain of ligaments of lumbar spine, initial encounter: Secondary | ICD-10-CM | POA: Diagnosis not present

## 2016-05-01 DIAGNOSIS — M47812 Spondylosis without myelopathy or radiculopathy, cervical region: Secondary | ICD-10-CM | POA: Diagnosis not present

## 2016-05-01 DIAGNOSIS — M9901 Segmental and somatic dysfunction of cervical region: Secondary | ICD-10-CM | POA: Diagnosis not present

## 2016-05-01 DIAGNOSIS — S233XXA Sprain of ligaments of thoracic spine, initial encounter: Secondary | ICD-10-CM | POA: Diagnosis not present

## 2016-05-01 DIAGNOSIS — M546 Pain in thoracic spine: Secondary | ICD-10-CM | POA: Diagnosis not present

## 2016-05-25 ENCOUNTER — Other Ambulatory Visit: Payer: Self-pay | Admitting: Family Medicine

## 2016-05-29 ENCOUNTER — Ambulatory Visit (INDEPENDENT_AMBULATORY_CARE_PROVIDER_SITE_OTHER): Payer: Medicare Other | Admitting: Family Medicine

## 2016-05-29 ENCOUNTER — Encounter: Payer: Self-pay | Admitting: Family Medicine

## 2016-05-29 VITALS — BP 96/63 | HR 66 | Temp 96.8°F | Ht 74.0 in | Wt 225.0 lb

## 2016-05-29 DIAGNOSIS — I2510 Atherosclerotic heart disease of native coronary artery without angina pectoris: Secondary | ICD-10-CM

## 2016-05-29 DIAGNOSIS — R972 Elevated prostate specific antigen [PSA]: Secondary | ICD-10-CM | POA: Diagnosis not present

## 2016-05-29 DIAGNOSIS — E785 Hyperlipidemia, unspecified: Secondary | ICD-10-CM

## 2016-05-29 DIAGNOSIS — E559 Vitamin D deficiency, unspecified: Secondary | ICD-10-CM

## 2016-05-29 DIAGNOSIS — I480 Paroxysmal atrial fibrillation: Secondary | ICD-10-CM | POA: Diagnosis not present

## 2016-05-29 MED ORDER — ATORVASTATIN CALCIUM 80 MG PO TABS: ORAL_TABLET | ORAL | 3 refills | Status: DC

## 2016-05-29 MED ORDER — DILTIAZEM HCL ER COATED BEADS 180 MG PO CP24: 180.0000 mg | ORAL_CAPSULE | Freq: Every day | ORAL | 3 refills | Status: DC

## 2016-05-29 NOTE — Progress Notes (Signed)
Subjective:    Patient ID: Jordan [name], male    DOB: March 16, 1946, 70 y.o.   MRN: [id number]  HPI  Pt here for follow up and management of chronic medical problems which includes hyperlipidemia. He is taking medication regularly.The patient is doing well overall. He continues to see the cardiologist. He is requesting refills on 2 different medications. He recently had a colonoscopy. He is going to get lab work today. He is up-to-date on his eye exam. The patient is pleasant and alert and is still working in his private business. He has tried to cut back some but enjoys what he is doing and feels like he is still needed. He continues to see the cardiologist about every 6 months and denies any palpitations chest pain pressure or tightness. He sees Dr. Marlou Porch. He denies any shortness of breath. He had a colonoscopy by Dr. Carlean Purl and did have several polyps and he says he plans to have this repeated in about 3 years. He also continues to see Dr. Jeffie Pollock because of an elevated PSA and sees him now once yearly..     Patient Active Problem List   Diagnosis Date Noted  . Long term (current) use of anticoagulants 06/24/2015  . 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 Chest pain 02/23/2013  . Ejection fraction   . Atrial fibrillation ([place])   . Drug intolerance   . Incomplete RBBB   . Carotid bruit   . Knee pain   . Anxiety   . hyperlipidemia   . Palpitations   . BPH (benign prostatic hypertrophy) 02/06/2009  . Personal history of colonic adenomas 11/29/2006   Outpatient Encounter Prescriptions as of 05/29/2016  Medication Sig  . apixaban (ELIQUIS) 5 MG TABS tablet Take 1 tablet (5 mg total) by mouth 2 (two) times daily.  Marland Kitchen atorvastatin (LIPITOR) 80 MG tablet TAKE (1/2) TABLET DAILY.  . Calcium Carb-Cholecalciferol (CALCIUM 600+D3 PO) Take 1 capsule by mouth daily.   . carboxymethylcellulose (REFRESH PLUS) 0.5 % SOLN Place  1 drop into both eyes 3 (three) times daily as needed. For dry eyes  . Cholecalciferol (VITAMIN D3) 1000 UNITS CAPS Take 1,000 Units by mouth daily.   Marland Kitchen diltiazem (CARDIZEM CD) 180 MG 24 hr capsule Take 1 capsule (180 mg total) by mouth daily.  Marland Kitchen diltiazem (CARDIZEM) 60 MG tablet Take 1 tablet (60 mg total) by mouth as needed.  . famotidine (PEPCID) 20 MG tablet Take 1 tablet (20 mg total) by mouth 2 (two) times daily.  . fish oil-omega-3 fatty acids 1000 MG capsule Take 1 g by mouth daily.   . Glucosamine-Chondroitin 1500-1200 MG/30ML LIQD Take 1,500 mg by mouth daily.   . Multiple Vitamins-Minerals (MULTIVITAMIN WITH MINERALS) tablet Take 1 tablet by mouth daily.   . nitroGLYCERIN (NITROSTAT) 0.4 MG SL tablet Place 1 tablet (0.4 mg total) under the tongue every 5 (five) minutes as needed for chest pain.  . Probiotic Product (ALIGN PO) Take by mouth.  . [DISCONTINUED] diltiazem (CARDIZEM) 60 MG tablet Take 60 mg by mouth daily as needed (FOR CONTROLLING  HEART RATE). Reported on 12/30/2015   No facility-administered encounter medications on file as of 05/29/2016.      Review of Systems  Constitutional: Negative.   HENT: Negative.   Eyes: Negative.   Respiratory: Negative.   Cardiovascular: Negative.   Gastrointestinal: Negative.   Endocrine: Negative.   Genitourinary: Negative.   Musculoskeletal: Negative.   Skin:  Negative.   Allergic/Immunologic: Negative.   Neurological: Negative.   Hematological: Negative.   Psychiatric/Behavioral: Negative.        Objective:   Physical Exam  Constitutional: He is oriented to person, place, and time. He appears well-developed and well-nourished. No distress.  The patient is pleasant and alert.  HENT:  Head: Normocephalic and atraumatic.  Right Ear: External ear normal.  Left Ear: External ear normal.  Nose: Nose normal.  Mouth/Throat: Oropharynx is clear and moist. No oropharyngeal exudate.  Eyes: Conjunctivae and EOM are normal. Pupils  are equal, round, and reactive to light. Right eye exhibits no discharge. Left eye exhibits no discharge. No scleral icterus.  Neck: Normal range of motion. Neck supple. No thyromegaly present.  No carotid bruits or anterior cervical adenopathy  Cardiovascular: Normal rate, regular rhythm, normal heart sounds and intact distal pulses.   No murmur heard. The heart was regular at 60/m  Pulmonary/Chest: Effort normal and breath sounds normal. No respiratory distress. He has no wheezes. He has no rales. He exhibits no tenderness.  Clear anteriorly and posteriorly and no axillary adenopathy  Abdominal: Soft. Bowel sounds are normal. He exhibits no mass. There is no tenderness. There is no rebound and no guarding.  No bruits organ enlargement masses or inguinal adenopathy  Genitourinary:  Genitourinary Comments: The patient is seen by the urologist regularly because of his persistently elevated PSA. This visit occurs in the spring of every year.  Musculoskeletal: Normal range of motion. He exhibits no edema.  Lymphadenopathy:    He has no cervical adenopathy.  Neurological: He is alert and oriented to person, place, and time. He has normal reflexes. No cranial nerve deficit.  Skin: Skin is warm and dry. No rash noted.  Psychiatric: He has a normal mood and affect. His behavior is normal. Judgment and thought content normal.  Nursing note and vitals reviewed.  BP 96/63 (BP Location: Left Arm)   Pulse 66   Temp (!) 96.8 F (36 C) (Oral)   Ht 6' 2"  (1.88 m)   Wt 225 lb (102.1 kg)   BMI 28.89 kg/m         Assessment & Plan:  1. hyperlipidemia -Continue with current treatment and aggressive therapeutic lifestyle changes and regular follow-up with cardiology - CBC with Differential/Platelet - BMP8+EGFR - Hepatic function panel - NMR, lipoprofile  2. Paroxysmal atrial fibrillation (HCC) -Continue with cardiology - CBC with Differential/Platelet  3. Vitamin D deficiency -Continue with  vitamin D replacement pending results of lab work - VITAMIN D 25 Hydroxy (Vit-D Deficiency, Fractures)  4. Elevated PSA -Continue follow-up with urology  Meds ordered this encounter  Medications  . diltiazem (CARDIZEM CD) 180 MG 24 hr capsule    Sig: Take 1 capsule (180 mg total) by mouth daily.    Dispense:  90 capsule    Refill:  3    PATIENT NEEDS TO CALL OUR OFFICE TO SCHEDULE A FOLLOW UP APPOINTMENT  . atorvastatin (LIPITOR) 80 MG tablet    Sig: TAKE (1/2) TABLET DAILY.    Dispense:  45 tablet    Refill:  3   Patient Instructions                       Medicare Annual Wellness Visit  [facility] and the medical providers at [facility] strive to bring you the best medical care.  In doing so we not only want to address your current medical conditions and concerns  but also to detect new conditions early and prevent illness, disease and health-related problems.    Medicare offers a yearly Wellness Visit which allows our clinical staff to assess your need for preventative services including immunizations, lifestyle education, counseling to decrease risk of preventable diseases and screening for fall risk and other medical concerns.    This visit is provided free of charge (no copay) for all Medicare recipients. The clinical pharmacists at [facility] have begun to conduct these Wellness Visits which will also include a thorough review of all your medications.    As you primary medical provider recommend that you make an appointment for your Annual Wellness Visit if you have not done so already this year.  You may set up this appointment before you leave today or you may call back [phone]) and schedule an appointment.  Please make sure when you call that you mention that you are scheduling your Annual Wellness Visit with the clinical pharmacist so that the appointment may be made for the proper length of time.     Continue current  medications. Continue good therapeutic lifestyle changes which include good diet and exercise. Fall precautions discussed with patient. If an FOBT was given today- please return it to our front desk. If you are over 68 years old - you may need Prevnar 37 or the adult Pneumonia vaccine.  **Flu shots are available--- please call and schedule a FLU-CLINIC appointment**  After your visit with [place] today you will receive a survey in the mail or online from [employer] regarding your care with [place]. Please take a moment to fill this out. Your feedback is very important to [place] as you can help [place] better understand your patient needs as well as improve your experience and satisfaction. WE CARE ABOUT YOU!!!   Continue with aggressive therapeutic lifestyle changes Continue to follow-up with cardiology and urology This winter work on the weight drink more water and continue with aggressive therapeutic lifestyle changes   Arrie Senate MD

## 2016-05-29 NOTE — Patient Instructions (Addendum)
Medicare Annual Wellness Visit  [facility] and the medical providers at [facility] strive to bring you the best medical care.  In doing so we not only want to address your current medical conditions and concerns but also to detect new conditions early and prevent illness, disease and health-related problems.    Medicare offers a yearly Wellness Visit which allows our clinical staff to assess your need for preventative services including immunizations, lifestyle education, counseling to decrease risk of preventable diseases and screening for fall risk and other medical concerns.    This visit is provided free of charge (no copay) for all Medicare recipients. The clinical pharmacists at [facility] have begun to conduct these Wellness Visits which will also include a thorough review of all your medications.    As you primary medical provider recommend that you make an appointment for your Annual Wellness Visit if you have not done so already this year.  You may set up this appointment before you leave today or you may call back [phone] and schedule an appointment.  Please make sure when you call that you mention that you are scheduling your Annual Wellness Visit with the clinical pharmacist so that the appointment may be made for the proper length of time.     Continue current medications. Continue good therapeutic lifestyle changes which include good diet and exercise. Fall precautions discussed with patient. If an FOBT was given today- please return it to our front desk. If you are over 56 years old - you may need Prevnar 45 or the adult Pneumonia vaccine.  **Flu shots are available--- please call and schedule a FLU-CLINIC appointment**  After your visit with [place] today you will receive a survey in the mail or online from [employer] regarding your care with [place]. Please take a moment to fill this out. Your feedback is very  important to [place] as you can help [place] better understand your patient needs as well as improve your experience and satisfaction. WE CARE ABOUT YOU!!!   Continue with aggressive therapeutic lifestyle changes Continue to follow-up with cardiology and urology This winter work on the weight drink more water and continue with aggressive therapeutic lifestyle changes

## 2016-05-30 LAB — CBC WITH DIFFERENTIAL/PLATELET
BASOS ABS: 0.1 10*3/uL (ref 0.0–0.2)
Basos: 1 %
EOS (ABSOLUTE): 0.2 10*3/uL (ref 0.0–0.4)
Eos: 3 %
Hematocrit: 45.1 % (ref 37.5–51.0)
Hemoglobin: 15.2 g/dL (ref 13.0–17.7)
IMMATURE GRANS (ABS): 0 10*3/uL (ref 0.0–0.1)
IMMATURE GRANULOCYTES: 0 %
LYMPHS: 22 %
Lymphocytes Absolute: 1.8 10*3/uL (ref 0.7–3.1)
MCH: 29.7 pg (ref 26.6–33.0)
MCHC: 33.7 g/dL (ref 31.5–35.7)
MCV: 88 fL (ref 79–97)
Monocytes Absolute: 0.6 10*3/uL (ref 0.1–0.9)
Monocytes: 7 %
NEUTROS PCT: 67 %
Neutrophils Absolute: 5.4 10*3/uL (ref 1.4–7.0)
PLATELETS: 232 10*3/uL (ref 150–379)
RBC: 5.12 x10E6/uL (ref 4.14–5.80)
RDW: 13.9 % (ref 12.3–15.4)
WBC: 8.1 10*3/uL (ref 3.4–10.8)

## 2016-05-30 LAB — HEPATIC FUNCTION PANEL
ALBUMIN: 4 g/dL (ref 3.6–4.8)
ALK PHOS: 65 IU/L (ref 39–117)
ALT: 28 IU/L (ref 0–44)
AST: 24 IU/L (ref 0–40)
BILIRUBIN TOTAL: 0.7 mg/dL (ref 0.0–1.2)
Bilirubin, Direct: 0.17 mg/dL (ref 0.00–0.40)
Total Protein: 6.5 g/dL (ref 6.0–8.5)

## 2016-05-30 LAB — BMP8+EGFR
BUN/Creatinine Ratio: 21 (ref 10–24)
BUN: 21 mg/dL (ref 8–27)
CALCIUM: 8.7 mg/dL (ref 8.6–10.2)
CO2: 28 mmol/L (ref 18–29)
CREATININE: 1.01 mg/dL (ref 0.76–1.27)
Chloride: 101 mmol/L (ref 96–106)
GFR, EST AFRICAN AMERICAN: 87 mL/min/{1.73_m2} (ref >59)
GFR, EST NON AFRICAN AMERICAN: 76 mL/min/{1.73_m2} (ref >59)
GLUCOSE: 85 mg/dL (ref 65–99)
Potassium: 3.9 mmol/L (ref 3.5–5.2)
SODIUM: 142 mmol/L (ref 134–144)

## 2016-05-30 LAB — NMR, LIPOPROFILE
Cholesterol: 123 mg/dL (ref 100–199)
HDL Cholesterol by NMR: 39 mg/dL — ABNORMAL LOW (ref >39)
HDL PARTICLE NUMBER: 27.4 umol/L — AB (ref >=30.5)
LDL Particle Number: 814 nmol/L (ref <1000)
LDL Size: 20.5 nm (ref >20.5)
LDL-C: 64 mg/dL (ref 0–99)
LP-IR SCORE: 65 — AB (ref <=45)
SMALL LDL PARTICLE NUMBER: 348 nmol/L (ref <=527)
Triglycerides by NMR: 102 mg/dL (ref 0–149)

## 2016-05-30 LAB — VITAMIN D 25 HYDROXY (VIT D DEFICIENCY, FRACTURES): VIT D 25 HYDROXY: 57.3 ng/mL (ref 30.0–100.0)

## 2016-06-02 DIAGNOSIS — H04123 Dry eye syndrome of bilateral lacrimal glands: Secondary | ICD-10-CM | POA: Diagnosis not present

## 2016-10-31 DIAGNOSIS — M79672 Pain in left foot: Secondary | ICD-10-CM | POA: Diagnosis not present

## 2016-10-31 DIAGNOSIS — M216X1 Other acquired deformities of right foot: Secondary | ICD-10-CM | POA: Diagnosis not present

## 2016-10-31 DIAGNOSIS — M216X2 Other acquired deformities of left foot: Secondary | ICD-10-CM | POA: Diagnosis not present

## 2016-10-31 DIAGNOSIS — M79671 Pain in right foot: Secondary | ICD-10-CM | POA: Diagnosis not present

## 2016-11-17 DIAGNOSIS — M546 Pain in thoracic spine: Secondary | ICD-10-CM | POA: Diagnosis not present

## 2016-11-17 DIAGNOSIS — M9903 Segmental and somatic dysfunction of lumbar region: Secondary | ICD-10-CM | POA: Diagnosis not present

## 2016-11-17 DIAGNOSIS — M9902 Segmental and somatic dysfunction of thoracic region: Secondary | ICD-10-CM | POA: Diagnosis not present

## 2016-11-17 DIAGNOSIS — M9901 Segmental and somatic dysfunction of cervical region: Secondary | ICD-10-CM | POA: Diagnosis not present

## 2016-11-17 DIAGNOSIS — M47812 Spondylosis without myelopathy or radiculopathy, cervical region: Secondary | ICD-10-CM | POA: Diagnosis not present

## 2016-11-21 ENCOUNTER — Telehealth: Payer: Self-pay | Admitting: Family Medicine

## 2016-11-21 DIAGNOSIS — Z79899 Other long term (current) drug therapy: Secondary | ICD-10-CM

## 2016-11-21 DIAGNOSIS — E782 Mixed hyperlipidemia: Secondary | ICD-10-CM

## 2016-11-23 DIAGNOSIS — M546 Pain in thoracic spine: Secondary | ICD-10-CM | POA: Diagnosis not present

## 2016-11-23 DIAGNOSIS — M47812 Spondylosis without myelopathy or radiculopathy, cervical region: Secondary | ICD-10-CM | POA: Diagnosis not present

## 2016-11-23 DIAGNOSIS — M9901 Segmental and somatic dysfunction of cervical region: Secondary | ICD-10-CM | POA: Diagnosis not present

## 2016-11-23 DIAGNOSIS — M9903 Segmental and somatic dysfunction of lumbar region: Secondary | ICD-10-CM | POA: Diagnosis not present

## 2016-11-23 DIAGNOSIS — M9902 Segmental and somatic dysfunction of thoracic region: Secondary | ICD-10-CM | POA: Diagnosis not present

## 2016-11-23 NOTE — Telephone Encounter (Signed)
Patient needs labs for Dr Laurance Flatten next week.  Orders placed.

## 2016-11-24 ENCOUNTER — Other Ambulatory Visit: Payer: Medicare Other

## 2016-11-24 DIAGNOSIS — E782 Mixed hyperlipidemia: Secondary | ICD-10-CM | POA: Diagnosis not present

## 2016-11-24 DIAGNOSIS — Z125 Encounter for screening for malignant neoplasm of prostate: Secondary | ICD-10-CM | POA: Diagnosis not present

## 2016-11-24 DIAGNOSIS — Z7289 Other problems related to lifestyle: Secondary | ICD-10-CM | POA: Diagnosis not present

## 2016-11-24 DIAGNOSIS — R002 Palpitations: Secondary | ICD-10-CM | POA: Diagnosis not present

## 2016-11-24 DIAGNOSIS — Z79899 Other long term (current) drug therapy: Secondary | ICD-10-CM | POA: Diagnosis not present

## 2016-11-24 LAB — CMP14+EGFR
ALBUMIN: 4.2 g/dL (ref 3.6–4.8)
ALK PHOS: 65 IU/L (ref 39–117)
ALT: 25 IU/L (ref 0–44)
AST: 23 IU/L (ref 0–40)
Albumin/Globulin Ratio: 1.9 (ref 1.2–2.2)
BUN / CREAT RATIO: 18 (ref 10–24)
BUN: 20 mg/dL (ref 8–27)
Bilirubin Total: 1.1 mg/dL (ref 0.0–1.2)
CALCIUM: 8.8 mg/dL (ref 8.6–10.2)
CO2: 24 mmol/L (ref 20–29)
CREATININE: 1.09 mg/dL (ref 0.76–1.27)
Chloride: 103 mmol/L (ref 96–106)
GFR calc Af Amer: 80 mL/min/{1.73_m2} (ref >59)
GFR, EST NON AFRICAN AMERICAN: 69 mL/min/{1.73_m2} (ref >59)
GLOBULIN, TOTAL: 2.2 g/dL (ref 1.5–4.5)
GLUCOSE: 87 mg/dL (ref 65–99)
Potassium: 4.4 mmol/L (ref 3.5–5.2)
SODIUM: 141 mmol/L (ref 134–144)
TOTAL PROTEIN: 6.4 g/dL (ref 6.0–8.5)

## 2016-11-24 LAB — CBC WITH DIFFERENTIAL/PLATELET
BASOS: 1 %
Basophils Absolute: 0 10*3/uL (ref 0.0–0.2)
EOS (ABSOLUTE): 0.3 10*3/uL (ref 0.0–0.4)
EOS: 4 %
HEMATOCRIT: 45.4 % (ref 37.5–51.0)
HEMOGLOBIN: 15.4 g/dL (ref 13.0–17.7)
IMMATURE GRANULOCYTES: 0 %
Immature Grans (Abs): 0 10*3/uL (ref 0.0–0.1)
LYMPHS ABS: 1.6 10*3/uL (ref 0.7–3.1)
Lymphs: 24 %
MCH: 30.3 pg (ref 26.6–33.0)
MCHC: 33.9 g/dL (ref 31.5–35.7)
MCV: 89 fL (ref 79–97)
MONOCYTES: 9 %
Monocytes Absolute: 0.6 10*3/uL (ref 0.1–0.9)
Neutrophils Absolute: 4.3 10*3/uL (ref 1.4–7.0)
Neutrophils: 62 %
Platelets: 212 10*3/uL (ref 150–379)
RBC: 5.08 x10E6/uL (ref 4.14–5.80)
RDW: 13.4 % (ref 12.3–15.4)
WBC: 6.8 10*3/uL (ref 3.4–10.8)

## 2016-11-25 LAB — NMR, LIPOPROFILE
CHOLESTEROL: 124 mg/dL (ref 100–199)
HDL CHOLESTEROL BY NMR: 35 mg/dL — AB (ref >39)
HDL PARTICLE NUMBER: 27.7 umol/L — AB (ref >=30.5)
LDL PARTICLE NUMBER: 1028 nmol/L — AB (ref <1000)
LDL Size: 20.2 nm (ref >20.5)
LDL-C: 73 mg/dL (ref 0–99)
LP-IR SCORE: 51 — AB (ref <=45)
Small LDL Particle Number: 566 nmol/L — ABNORMAL HIGH (ref <=527)
Triglycerides by NMR: 80 mg/dL (ref 0–149)

## 2016-11-28 ENCOUNTER — Encounter: Payer: Self-pay | Admitting: Family Medicine

## 2016-11-28 ENCOUNTER — Ambulatory Visit (INDEPENDENT_AMBULATORY_CARE_PROVIDER_SITE_OTHER): Payer: Medicare Other | Admitting: Family Medicine

## 2016-11-28 VITALS — BP 94/59 | HR 64 | Temp 97.1°F | Ht 74.0 in | Wt 225.0 lb

## 2016-11-28 DIAGNOSIS — R12 Heartburn: Secondary | ICD-10-CM

## 2016-11-28 DIAGNOSIS — I480 Paroxysmal atrial fibrillation: Secondary | ICD-10-CM

## 2016-11-28 DIAGNOSIS — Z6828 Body mass index (BMI) 28.0-28.9, adult: Secondary | ICD-10-CM

## 2016-11-28 DIAGNOSIS — Z0000 Encounter for general adult medical examination without abnormal findings: Secondary | ICD-10-CM

## 2016-11-28 DIAGNOSIS — N400 Enlarged prostate without lower urinary tract symptoms: Secondary | ICD-10-CM

## 2016-11-28 DIAGNOSIS — E782 Mixed hyperlipidemia: Secondary | ICD-10-CM

## 2016-11-28 DIAGNOSIS — R972 Elevated prostate specific antigen [PSA]: Secondary | ICD-10-CM

## 2016-11-28 DIAGNOSIS — E559 Vitamin D deficiency, unspecified: Secondary | ICD-10-CM

## 2016-11-28 DIAGNOSIS — Z23 Encounter for immunization: Secondary | ICD-10-CM

## 2016-11-28 LAB — URINALYSIS, COMPLETE
Bilirubin, UA: NEGATIVE
Glucose, UA: NEGATIVE
Ketones, UA: NEGATIVE
Leukocytes, UA: NEGATIVE
Nitrite, UA: NEGATIVE
Protein, UA: NEGATIVE
RBC, UA: NEGATIVE
Specific Gravity, UA: 1.015 (ref 1.005–1.030)
Urobilinogen, Ur: 0.2 mg/dL (ref 0.2–1.0)
pH, UA: 8.5 — ABNORMAL HIGH (ref 5.0–7.5)

## 2016-11-28 LAB — MICROSCOPIC EXAMINATION
Bacteria, UA: NONE SEEN
EPITHELIAL CELLS (NON RENAL): NONE SEEN /HPF (ref 0–10)
RBC, UA: NONE SEEN /hpf (ref 0–?)
Renal Epithel, UA: NONE SEEN /hpf
WBC UA: NONE SEEN /HPF (ref 0–?)

## 2016-11-28 MED ORDER — APIXABAN 5 MG PO TABS: 5.0000 mg | ORAL_TABLET | Freq: Two times a day (BID) | ORAL | 11 refills | Status: DC

## 2016-11-28 NOTE — Addendum Note (Signed)
Addended by: Zannie Cove on: 11/28/2016 09:49 AM   Modules accepted: Orders

## 2016-11-28 NOTE — Patient Instructions (Addendum)
Medicare Annual Wellness Visit  [facility] and the medical providers at [facility] strive to bring you the best medical care.  In doing so we not only want to address your current medical conditions and concerns but also to detect new conditions early and prevent illness, disease and health-related problems.    Medicare offers a yearly Wellness Visit which allows our clinical staff to assess your need for preventative services including immunizations, lifestyle education, counseling to decrease risk of preventable diseases and screening for fall risk and other medical concerns.    This visit is provided free of charge (no copay) for all Medicare recipients. The clinical pharmacists at [facility] have begun to conduct these Wellness Visits which will also include a thorough review of all your medications.    As you primary medical provider recommend that you make an appointment for your Annual Wellness Visit if you have not done so already this year.  You may set up this appointment before you leave today or you may call back [phone]) and schedule an appointment.  Please make sure when you call that you mention that you are scheduling your Annual Wellness Visit with the clinical pharmacist so that the appointment may be made for the proper length of time.     Continue current medications. Continue good therapeutic lifestyle changes which include good diet and exercise. Fall precautions discussed with patient. If an FOBT was given today- please return it to our front desk. If you are over 22 years old - you may need Prevnar 2 or the adult Pneumonia vaccine.  **Flu shots are available--- please call and schedule a FLU-CLINIC appointment**  After your visit with [place] today you will receive a survey in the mail or online from [employer] regarding your care with [place]. Please take a moment to fill this out. Your feedback is very  important to [place] as you can help [place] better understand your patient needs as well as improve your experience and satisfaction. WE CARE ABOUT YOU!!!    we wll schedule the paient for a visit with clinical pharmacy to discuss a diet regimen appropriate for is hyperlpidemia with an effort 2 help him lose some weight and get closer to his nomal BMI of 25.  the patient can purchase Zantac or ranitdine over-the-counter , the equate brand

## 2016-11-28 NOTE — Progress Notes (Signed)
Subjective:    Patient ID: Jordan [name], male    DOB: 1945/12/08, 71 y.o.   MRN: [id number]  HPI Patient is here today for annual wellness exam and follow up of chronic medical problems which includes hyperlipidemia. He is taking medication regularly.The patient has a BMI that is 28.9. This is in the overweight pre-obese category. The patient denies any chest pain pressure or tightnss. He has been seeing Dr. Marlou Porch nurse practitioner. He denies any trobe with shortness of breath. He denies any problems with nausea vomiting dirrhea blood in the stool or black tarry bowel movements. o change in bowel habits. He's passing his water well wihout problems. He has an upcming appointment with his eye doctor.    Patient Active Problem List   Diagnosis Date Noted  . Long term (current) use of anticoagulants 06/24/2015  . 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 Chest pain 02/23/2013  . Ejection fraction   . Atrial fibrillation ([place])   . Drug intolerance   . Incomplete RBBB   . Carotid bruit   . Knee pain   . Anxiety   . hyperlipidemia   . Palpitations   . BPH (benign prostatic hypertrophy) 02/06/2009  . Personal history of colonic adenomas 11/29/2006   Outpatient Encounter Prescriptions as of 11/28/2016  Medication Sig  . apixaban (ELIQUIS) 5 MG TABS tablet Take 1 tablet (5 mg total) by mouth 2 (two) times daily.  Marland Kitchen atorvastatin (LIPITOR) 80 MG tablet TAKE (1/2) TABLET DAILY.  . Calcium Carb-Cholecalciferol (CALCIUM 600+D3 PO) Take 1 capsule by mouth daily.   . carboxymethylcellulose (REFRESH PLUS) 0.5 % SOLN Place 1 drop into both eyes 3 (three) times daily as needed. For dry eyes  . Cholecalciferol (VITAMIN D3) 1000 UNITS CAPS Take 1,000 Units by mouth daily.   Marland Kitchen diltiazem (CARDIZEM CD) 180 MG 24 hr capsule Take 1 capsule (180 mg total) by mouth daily.  Marland Kitchen diltiazem (CARDIZEM) 60 MG tablet Take 1 tablet (60 mg total)  by mouth as needed.  . famotidine (PEPCID) 20 MG tablet Take 1 tablet (20 mg total) by mouth 2 (two) times daily.  . fish oil-omega-3 fatty acids 1000 MG capsule Take 1 g by mouth daily.   . Glucosamine-Chondroitin 1500-1200 MG/30ML LIQD Take 1,500 mg by mouth daily.   . Multiple Vitamins-Minerals (MULTIVITAMIN WITH MINERALS) tablet Take 1 tablet by mouth daily.   . nitroGLYCERIN (NITROSTAT) 0.4 MG SL tablet Place 1 tablet (0.4 mg total) under the tongue every 5 (five) minutes as needed for chest pain.  . Probiotic Product (ALIGN PO) Take by mouth.   No facility-administered encounter medications on file as of 11/28/2016.       Review of Systems  Constitutional: Negative.   HENT: Negative.   Eyes: Negative.   Respiratory: Negative.   Cardiovascular: Negative.   Gastrointestinal: Negative.   Endocrine: Negative.   Genitourinary: Negative.   Musculoskeletal: Negative.   Skin: Negative.   Allergic/Immunologic: Negative.   Neurological: Negative.   Hematological: Negative.   Psychiatric/Behavioral: Negative.        Objective:   Physical Exam  Constitutional: He is oriented to person, place, and time. He appears well-developed and well-nourished. No distress.  The patient is pleasant and alert and desires to want to lose weight.  HENT:  Head: Normocephalic and atraumatic.  Right Ear: External ear normal.  Left Ear: External ear normal.  Nose: Nose normal.  Mouth/Throat: Oropharynx is clear and  moist. No oropharyngeal exudate.  Eyes: Conjunctivae and EOM are normal. Pupils are equal, round, and reactive to light. Right eye exhibits no discharge. Left eye exhibits no discharge. No scleral icterus.  Neck: Normal range of motion. Neck supple. No thyromegaly present.  No bruits thyromegaly or anterior cervical adenopathy  Cardiovascular: Normal rate, regular rhythm and intact distal pulses.   No murmur heard. Heart is regular today at 60/m  Pulmonary/Chest: Effort normal and breath  sounds normal. No respiratory distress. He has no wheezes. He has no rales. He exhibits no tenderness.  Clear anteriorly and posteriorly with no axillary adenopathy and no chest wall masses  Abdominal: Soft. Bowel sounds are normal. He exhibits no mass. There is no tenderness. There is no rebound and no guarding.  The abdomen is soft without liver or spleen enlargement bruits masses or inguinal adenopathy  Genitourinary: Rectum normal, prostate normal and penis normal.  Genitourinary Comments: The prostate is enlarged but soft and smooth. There were no rectal masses. External genitalia were within normal limits.  Musculoskeletal: Normal range of motion. He exhibits no edema.  Lymphadenopathy:    He has no cervical adenopathy.  Neurological: He is alert and oriented to person, place, and time. He has normal reflexes. No cranial nerve deficit.  Skin: Skin is warm and dry. No rash noted.  Psychiatric: He has a normal mood and affect. His behavior is normal. Judgment and thought content normal.  Nursing note and vitals reviewed.  BP (!) 94/59 (BP Location: Left Arm)   Pulse 64   Temp 97.1 F (36.2 C) (Oral)   Ht 6\' 2"  (1.88 m)   Wt 225 lb (102.1 kg)   BMI 28.89 kg/m         Assessment & Plan:  1. Annual physical exam -We will add a PSA vitamin D level and hepatitis C to the patient's current blood work. - Urinalysis, Complete  2. Mixed hyperlipidemia -Continue and try to do better with diet exercise and weight loss and get the LDL particle number down below 1000.  3. Vitamin D deficiency -This will be added to the blood work that he is Re: Had done and he should continue current treatment pending results of this.  4. Paroxysmal atrial fibrillation (HCC) -Continue to follow-up with cardiology. The patient's rhythm today was normal and the rate was 60. - apixaban (ELIQUIS) 5 MG TABS tablet; Take 1 tablet (5 mg total) by mouth 2 (two) times daily.  Dispense: 60 tablet; Refill:  11  5. Elevated PSA -The patient will follow-up with the urologist in the fall. He will take a copy of the PSA results with him that were added to the blood work today. - Urinalysis, Complete  6. BMI 28.0-28.9,adult -We will refer him to the clinical pharmacist so he can work on his weight better and try to get more exercise and we also referred him to [facility] which [facility] a local rehabilitation area over the heads trainers to help work with people with exercise.  7. Benign prostatic hyperplasia without lower urinary tract symptoms -The patient will see the urologist in the fall and a PSA has been added to the blood work  8. Heartburn -He will continue to try to watch his diet as closely as possible and I suggested that he try some ranitidine 150 mg over-the-counter prior to eating any foods that may bother his stomach on an as-needed basis.  Meds ordered this encounter  Medications  . apixaban (ELIQUIS) 5  MG TABS tablet    Sig: Take 1 tablet (5 mg total) by mouth 2 (two) times daily.    Dispense:  60 tablet    Refill:  11   Patient Instructions                       Medicare Annual Wellness Visit  [facility] and the medical providers at [facility] strive to bring you the best medical care.  In doing so we not only want to address your current medical conditions and concerns but also to detect new conditions early and prevent illness, disease and health-related problems.    Medicare offers a yearly Wellness Visit which allows our clinical staff to assess your need for preventative services including immunizations, lifestyle education, counseling to decrease risk of preventable diseases and screening for fall risk and other medical concerns.    This visit is provided free of charge (no copay) for all Medicare recipients. The clinical pharmacists at [facility] have begun to conduct these Wellness Visits which will also include a  thorough review of all your medications.    As you primary medical provider recommend that you make an appointment for your Annual Wellness Visit if you have not done so already this year.  You may set up this appointment before you leave today or you may call back ([phone]) and schedule an appointment.  Please make sure when you call that you mention that you are scheduling your Annual Wellness Visit with the clinical pharmacist so that the appointment may be made for the proper length of time.     Continue current medications. Continue good therapeutic lifestyle changes which include good diet and exercise. Fall precautions discussed with patient. If an FOBT was given today- please return it to our front desk. If you are over 58 years old - you may need Prevnar 28 or the adult Pneumonia vaccine.  **Flu shots are available--- please call and schedule a FLU-CLINIC appointment**  After your visit with [place] today you will receive a survey in the mail or online from [employer] regarding your care with [place]. Please take a moment to fill this out. Your feedback is very important to [place] as you can help [place] better understand your patient needs as well as improve your experience and satisfaction. WE CARE ABOUT YOU!!!    we wll schedule the paient for a visit with clinical pharmacy to discuss a diet regimen appropriate for is hyperlpidemia with an effort 2 help him lose some weight and get closer to his nomal BMI of 25.  the patient can purchase Zantac or ranitdine over-the-counter , the equate brand    Arrie Senate MD

## 2016-11-30 LAB — SPECIMEN STATUS REPORT

## 2016-11-30 LAB — PSA, TOTAL AND FREE
PROSTATE SPECIFIC AG, SERUM: 7.6 ng/mL — AB (ref 0.0–4.0)
PSA FREE PCT: 16.6 %
PSA FREE: 1.26 ng/mL

## 2016-11-30 LAB — HEPATITIS C ANTIBODY: Hep C Virus Ab: 0.1 s/co ratio (ref 0.0–0.9)

## 2016-11-30 LAB — VITAMIN D 25 HYDROXY (VIT D DEFICIENCY, FRACTURES): Vit D, 25-Hydroxy: 57.8 ng/mL (ref 30.0–100.0)

## 2016-12-05 ENCOUNTER — Encounter: Payer: Self-pay | Admitting: Pharmacist

## 2016-12-05 ENCOUNTER — Ambulatory Visit (INDEPENDENT_AMBULATORY_CARE_PROVIDER_SITE_OTHER): Payer: Medicare Other | Admitting: Pharmacist

## 2016-12-05 VITALS — BP 105/68 | HR 68 | Ht 74.0 in | Wt 226.5 lb

## 2016-12-05 DIAGNOSIS — E785 Hyperlipidemia, unspecified: Secondary | ICD-10-CM | POA: Diagnosis not present

## 2016-12-05 DIAGNOSIS — E663 Overweight: Secondary | ICD-10-CM | POA: Diagnosis not present

## 2016-12-05 NOTE — Addendum Note (Signed)
Addended by: Cherre Robins B on: 12/05/2016 09:05 AM   Modules accepted: Level of Service

## 2016-12-05 NOTE — Patient Instructions (Addendum)
Recommend goal weight of 200 lbs. (about a 10% weight loss)  Increase vegetable intake for snacks and at lunch - cucumbers, peppers, celery.  Increase physical activity - goal is at least 150 minutes per week.   2012 - 210 lbs 2013 - 213 lbs 2014 - 215 lbs 2015 - 218 lbs 2016 - 223 lbs 2017 - 221 lbs  2018 - 225 lbs

## 2016-12-05 NOTE — Progress Notes (Signed)
Patient ID: Jordan [name], male   DOB: Sep 09, 1945, 71 y.o.   MRN: [id number]   Subjective:     Jordan [name] is a 71 y.o. male is referred by his PCP Dr Redge Gainer to discuss overweight / weight loss.  I am asked to consult for evaluation and treatment of obesity and dyslipidemia. Patient cites health as reasons for wanting to lose weight.  Obesity History Weight in 2012 was 210 - has increased by 2 to 3 lbs every year.    History of Weight Loss Efforts No recent history of weight loss Successful weight loss techniques attempted: self-directed dieting Unsuccessful weight loss techniques attempted: none  Current Exercise Habits walking and indoor spinner - about 3 times per week  Current Eating Habits Number of regular meals per day: 3 Number of snacking episodes per day: 1-2 Who shops for food? patient and spouse Who prepares food? patient and spouse Who eats with patient? patient and spouse Binge behavior?: no Purge behavior? no Anorexic behavior? no Eating precipitated by stress? no Guilt feelings associated with eating? no  Other Potential Contributing Factors Use of medications that may cause weight gain none History of past abuse? none Psych History: good mood Comorbidities: coronary heart disease, dyslipidemias and lower extremity venous stasis disease The following portions of the patient's history were reviewed and updated as appropriate: allergies, current medications, past family history and problem list.       Objective:    BP 105/68   Pulse 68   Ht 6\' 2"  (1.88 m)   Wt 226 lb 8 oz (102.7 kg)   BMI 29.08 kg/m  Body mass index is 29.08 kg/m.  Lab Review  Lipid Panel     Component Value Date/Time   CHOL 124 11/24/2016 0934   CHOL 125 03/06/2013 0821   TRIG 80 11/24/2016 0934   TRIG 73 03/06/2013 0821   HDL 35 (L) 11/24/2016 0934   HDL 40 03/06/2013 0821   CHOLHDL 2.8 02/24/2013 0900   VLDL 16 02/24/2013 0900   LDLCALC 68 11/18/2013 0804   LDLCALC 70 03/06/2013 0821   LDL particle number increase from 05/2016 to 11/2016 from 814 to 1028 HDL decreased from 39 to 35 Small LDL particles also increased from 348 to 566  BMP Latest Ref Rng & Units 11/24/2016 05/29/2016 11/22/2015  Glucose 65 - 99 mg/dL 87 85 86  BUN 8 - 27 mg/dL 20 21 21   Creatinine 0.76 - 1.27 mg/dL 1.09 1.01 1.01  BUN/Creat Ratio 10 - 24 18 21 21   Sodium 134 - 144 mmol/L 141 142 143  Potassium 3.5 - 5.2 mmol/L 4.4 3.9 4.2  Chloride 96 - 106 mmol/L 103 101 103  CO2 20 - 29 mmol/L 24 28 25   Calcium 8.6 - 10.2 mg/dL 8.8 8.7 8.7      Assessment:    Overweight with BMI and comorbidities as noted above. Signs of hypothyroidism: none Contraindications to weight loss: none Patient readiness to commit to diet and activity changes: good Barriers to weight loss: time limitations     Plan:   1. Target weight loss is 10% or 25#  2. Diet interventions: patient is following a fairly good diet already - low fat, eats lots of fruit and vebetables.  Reviewed information below. Proper food choices reviewed: yes Preparation techniques reviewed: yes Careful meal planning; avoiding ad hoc eating: yes Stimulus control to control unhealthy eating: yes Handouts given: samples diet given  3. Exercise intervention:  Informal measures, e.g. taking  stairs instead of elevator: yes Formal exercise regimen: yes - encouraged joining gym.   Patient to schedule time for exercise to better include in daily routine Recommended mix for aerobic and strength building exercises  4. Follow up: as needed.

## 2016-12-06 ENCOUNTER — Ambulatory Visit: Payer: Self-pay | Admitting: Cardiology

## 2017-01-27 DIAGNOSIS — H5203 Hypermetropia, bilateral: Secondary | ICD-10-CM | POA: Diagnosis not present

## 2017-02-09 DIAGNOSIS — M9903 Segmental and somatic dysfunction of lumbar region: Secondary | ICD-10-CM | POA: Diagnosis not present

## 2017-02-09 DIAGNOSIS — M9901 Segmental and somatic dysfunction of cervical region: Secondary | ICD-10-CM | POA: Diagnosis not present

## 2017-02-09 DIAGNOSIS — M546 Pain in thoracic spine: Secondary | ICD-10-CM | POA: Diagnosis not present

## 2017-02-09 DIAGNOSIS — M9902 Segmental and somatic dysfunction of thoracic region: Secondary | ICD-10-CM | POA: Diagnosis not present

## 2017-02-09 DIAGNOSIS — M47812 Spondylosis without myelopathy or radiculopathy, cervical region: Secondary | ICD-10-CM | POA: Diagnosis not present

## 2017-02-19 DIAGNOSIS — R52 Pain, unspecified: Secondary | ICD-10-CM | POA: Diagnosis not present

## 2017-02-19 DIAGNOSIS — M1711 Unilateral primary osteoarthritis, right knee: Secondary | ICD-10-CM | POA: Diagnosis not present

## 2017-02-23 DIAGNOSIS — R972 Elevated prostate specific antigen [PSA]: Secondary | ICD-10-CM | POA: Diagnosis not present

## 2017-02-27 DIAGNOSIS — M1712 Unilateral primary osteoarthritis, left knee: Secondary | ICD-10-CM | POA: Diagnosis not present

## 2017-02-27 DIAGNOSIS — S83242A Other tear of medial meniscus, current injury, left knee, initial encounter: Secondary | ICD-10-CM | POA: Diagnosis not present

## 2017-02-28 DIAGNOSIS — R972 Elevated prostate specific antigen [PSA]: Secondary | ICD-10-CM | POA: Diagnosis not present

## 2017-02-28 DIAGNOSIS — N401 Enlarged prostate with lower urinary tract symptoms: Secondary | ICD-10-CM | POA: Diagnosis not present

## 2017-03-05 ENCOUNTER — Encounter: Payer: Self-pay | Admitting: Physical Therapy

## 2017-03-05 ENCOUNTER — Ambulatory Visit: Payer: Medicare Other | Attending: Sports Medicine | Admitting: Physical Therapy

## 2017-03-05 DIAGNOSIS — M25562 Pain in left knee: Secondary | ICD-10-CM | POA: Insufficient documentation

## 2017-03-05 DIAGNOSIS — R600 Localized edema: Secondary | ICD-10-CM

## 2017-03-05 DIAGNOSIS — M25661 Stiffness of right knee, not elsewhere classified: Secondary | ICD-10-CM | POA: Insufficient documentation

## 2017-03-05 NOTE — Patient Instructions (Signed)
Hamstring Stretch, Reclined (Strap, Doorframe)   Lengthen bottom leg on floor. Extend top leg along edge of doorframe or press foot up into yoga strap. Hold for 30 seconds. Repeat 3_ times each leg.   Outer Hip Stretch: Reclined IT Band Stretch (Strap)   Strap around opposite foot, pull across only as far as possible with shoulders on mat. Hold for __30__ seconds. Repeat __3__ times each leg. 2-3 x/day.   Quadriceps (Prone)   On stomach with sheet around ankles, knees together, hips down, pull heels toward bottom. Keep hips flat. Hold __30__ seconds. Repeat _3__ times. Do __3__ sessions per day. CAUTION: Stretch should be gentle, steady and slow.   Achilles Tendon Stretch   Stand with hands supported on wall, elbows slightly bent, feet parallel and both heels on floor, front knee bent, back knee straight. Slowly relax back knee until a stretch is felt in achilles tendon. Hold __30_ seconds. Repeat with leg positions switched. Repeat with back knee slightly bent.  [facility]  DRY NEEDLING CONSENT FORM   Trigger point dry needling is a physical therapy approach to treat Myofascial Pain and Dysfunction.  Dry Needling (DN) is a valuable and effective way to deactivate myofascial trigger points (muscle knots). It is skilled intervention that uses a thin filiform needle to penetrate the skin and stimulate underlying myofascial trigger points, muscular, and connective tissues for the management of neuromusculoskeletal pain and movement impairments.  A local twitch response (LTR) will be elicited.  This can sometimes feel like a deep ache in the muscle during the procedure. Multiple trigger points in multiple muscles can be treated during each treatment.  No medication of any kind is injected.   As with any medical treatment and procedure, there are possible adverse events.  While significant adverse events are uncommon, they do sometimes occur and must  be considered prior to giving consent.  1. Dry needling often causes a "post needling soreness".  There can be an increase in pain from a couple of hours to 2-3 days, followed by an improvement in the overall pain state. 2. Any time a needle is used there is a risk of infection.  However, we are using new, sterile, and disposable needles; infections are extremely rare. 3. There is a possibility that you may bleed or bruise.  You may feel tired and some nausea following treatment. 4. There is a rare possibility of a pneumothorax (air in the chest cavity). 5. Allergic reaction to nickel in the stainless steel needle. 6. If a nerve is touched, it may cause paresthesia (a prickling/shock sensation) which is usually brief, but may continue for a couple of days.  Following treatment stay hydrated.  Continue regular activities but not too vigorous initially after treatment for 24-48 hours.  Dry Needling is best when combined with other physical therapy interventions such as strengthening, stretching and other therapeutic modalities.     PLEASE ANSWER THE FOLLOWING QUESTIONS:  Do you have a lack of sensation?   Y/N  Do you have a phobia or fear of needles  Y/N  Are you pregnant?    Y/N If yes:  How many weeks? _____  Do you have any implanted devices?  Y/N If yes:  Pacemaker/Spinal Cord         Stimulator/Deep Brain         Stimulator/Insulin          Pump/Other: ____________ Do you have any implants?   Y/N If yes:  Do you take any blood thinners?   Y/N If yes: Coumadin          (Warfarin)/Other:  Do you have a bleeding disorder?   Y/N If yes: What kind:   Do you take any immunosuppressants?  Y/N If yes:   What kind:   Do you take anti-inflammatories?   Y/N If yes: What kind:  Have you ever been diagnosed with Scoliosis? Y/N  Have you had back surgery?    Y/N If yes:         Laminectomy/Fusion/Other:   I have read, or had read to me, the above.  I have had the opportunity to ask any  questions.  All of my questions have been answered to my satisfaction and I understand the risks involved with dry needling.  I consent to examination and treatment at [facility], including dry needling, of any and all of my involved and affected muscles.   Madelyn Flavors, PT 03/05/17 1:37 PM [facility] [place], [place], [address] Phone: [phone]   Fax:  [phone]

## 2017-03-05 NOTE — Therapy (Signed)
[facility] [place], [place], [address] Phone: [phone]   Fax:  [phone]  Physical Therapy Evaluation  Patient Details  Name: Jordan [name] MRN: [id number] Date of Birth: 10-15-1945 Referring Provider: Jaynee Eagles  Encounter Date: 03/05/2017      PT End of Session - 03/05/17 1303    Visit Number 1   Number of Visits 16   Date for PT Re-Evaluation 04/30/17   PT Start Time 1304   PT Stop Time 1400   PT Time Calculation (min) 56 min   Activity Tolerance Patient tolerated treatment well   Behavior During Therapy [facility] for tasks assessed/performed      Past Medical History:  Diagnosis Date  . Anxiety    Mild  . Atrial fibrillation (HCC)    Paroxysmal  . BPH (benign prostatic hyperplasia)   . CAD (coronary artery disease)    Stent LAD, 2002  /  nuclear 2009, no ischemia  /    [facility] October, 2011 nuclear, no ischemia, possible slight apical scar, ejection fraction 70%  . Carotid bruit    Doppler November, 2011, normal  . Chronic anticoagulation    Low CHADS , score, but patient wants to be aggressive  . Colon polyps   . Drug intolerance    Mild beta blocker intolerance  . Dyslipidemia   . Ejection fraction    EF 65-70%, echo, 2007 /  EF 70%, nuclear, 2011  . Hyperlipidemia   . IBS (irritable bowel syndrome)   . Incomplete RBBB   . Knee pain    November, 201  . Personal history of colonic adenomas 11/29/2006   Qualifier: Diagnosis of  By: [place], [place]    . Syncope    November, 2010    Past Surgical History:  Procedure Laterality Date  . BACK SURGERY  05/1990   [id number]  . CARDIAC CATHETERIZATION    . COLONOSCOPY  multiple    There were no vitals filed for this visit.       Subjective Assessment - 03/05/17 1306    Subjective Patient reports he began having left knee pain Labor Day weekend. It got really stiff and became swollen the week of Sep 2nd, 2018. Fluid drained off knee 02/19/17,  but pain remained. Xrays looked good as far as spacing of the joint. Patient has tried KT tape and wrapped. It has remained stiff primarily in the morning. By end of day 9/10 pain.   Pertinent History afib, CAD, chronic anticoagulation   Diagnostic tests xrays   Patient Stated Goals able to ambulate without pain   Currently in Pain? Yes   Pain Score 7    Pain Location Knee   Pain Orientation Left   Pain Descriptors / Indicators Aching   Pain Type Acute pain   Pain Onset 1 to 4 weeks ago   Aggravating Factors  moving laterally, walking   Pain Relieving Factors ice, rest            [facility] PT Assessment - 03/05/17 0001      Assessment   Medical Diagnosis PF arthritis of L knee   Referring Provider Jaynee Eagles   Onset Date/Surgical Date 02/11/17   Next MD Visit not scheduled     Precautions   Precautions None     Restrictions   Weight Bearing Restrictions No     Balance Screen   Has the patient fallen in the past 6 months No   Has the patient had a  decrease in activity level because of a fear of falling?  No   Is the patient reluctant to leave their home because of a fear of falling?  No     Prior Function   Level of Independence Independent     Observation/Other Assessments   Focus on Therapeutic Outcomes (FOTO)  56% LIMITED     Observation/Other Assessments-Edema    Edema Circumferential     Circumferential Edema   Circumferential - Right 45 cm  24 cm ankle   Circumferential - Left  47 cm  27 cm ankle (circumferential)     Posture/Postural Control   Posture Comments L foot pronation     ROM / Strength   AROM / PROM / Strength AROM;PROM;Strength     AROM   AROM Assessment Site Knee   Right/Left Knee Left   Left Knee Flexion 120     PROM   PROM Assessment Site Knee   Right/Left Knee Left   Left Knee Flexion 130     Strength   Overall Strength Comments L hip ABD/ext 5/5, flex 4+/5   Strength Assessment Site Knee   Right/Left Knee Left   Left Knee  Flexion 4-/5   Left Knee Extension --  5-/5     Flexibility   Soft Tissue Assessment /Muscle Length yes   Hamstrings Bil   Quadriceps Bil   ITB Bil            Objective measurements completed on examination: See above findings.          [place] Adult PT Treatment/Exercise - 03/05/17 0001      Exercises   Exercises Knee/Hip     Knee/Hip Exercises: Stretches   Passive Hamstring Stretch Left;1 rep;30 seconds   Passive Hamstring Stretch Limitations VCs to keep knee straight with strap   Quad Stretch Left;1 rep;30 seconds  prone with strap   ITB Stretch Left;1 rep;30 seconds  with strap in supine     Modalities   Modalities Electrical Stimulation;Vasopneumatic     Electrical Stimulation   Electrical Stimulation Location L knee IFC 1-10 Hz x 15 min   Electrical Stimulation Goals Edema;Pain     Vasopneumatic   Number Minutes Vasopneumatic  15 minutes   Vasopnuematic Location  Knee   Vasopneumatic Pressure Low   Vasopneumatic Temperature  34                PT Education - 03/05/17 1346    Education provided Yes   Education Details HEP   Person(s) Educated Patient   Methods Explanation;Demonstration;Handout   Comprehension Verbalized understanding;Returned demonstration             PT Long Term Goals - 03/05/17 1408      PT LONG TERM GOAL #1   Title I with advanced HEP   Time 8   Period Weeks   Status New   Target Date 04/30/17     PT LONG TERM GOAL #2   Title decreased pain in L knee with standing and walking to 2/10 or less   Time 8   Period Weeks   Status New   Target Date 04/30/17     PT LONG TERM GOAL #3   Title Decreased edema in L knee to within 0.5 cm of R knee   Time 8   Period Weeks   Status New   Target Date 04/30/17     PT LONG TERM GOAL #4   Title improved L knee flexion strength to 5-/5  or better   Time 8   Period Weeks   Status New   Target Date 04/30/17                Plan - 2017-04-03 1347     Clinical Impression Statement Patient presents for low complexity evaluation for L knee pain. He has edema in his left knee and lower leg and pain with standing and walking. He has tight HS, quads, gastroc and ITB. He will benefit from PT to address these deficits.   History and Personal Factors relevant to plan of care: afib, CAD, chronic anticoagulation   Clinical Presentation Evolving   Clinical Decision Making Low   Rehab Potential Excellent   PT Frequency 2x / week   PT Duration 8 weeks   PT Treatment/Interventions ADLs/Self Care Home Management;Cryotherapy;Electrical Stimulation;Moist Heat;Ultrasound;Neuromuscular re-education;Therapeutic exercise;Patient/family education;Manual techniques;Passive range of motion;Dry needling;Therapeutic activities;Vasopneumatic Device   PT Next Visit Plan STW to L HS, quads, gastroc, ITB; DN if indicated, LE strengthening; modalities for pain/edema   PT Home Exercise Plan stretches for ITB, HS and quads with strap   Consulted and Agree with Plan of Care Patient      Patient will benefit from skilled therapeutic intervention in order to improve the following deficits and impairments:  Pain, Increased edema, Decreased strength, Impaired flexibility, Decreased range of motion  Visit Diagnosis: Acute pain of left knee  Localized edema      G-Codes - 04/03/17 1410    Functional Assessment Tool Used (Outpatient Only) FOTO 56% LIMITED   Functional Limitation Mobility: Walking and moving around   Mobility: Walking and Moving Around Current Status ([id number]) At least 40 percent but less than 60 percent impaired, limited or restricted   Mobility: Walking and Moving Around Goal Status [id number]) At least 40 percent but less than 60 percent impaired, limited or restricted       Problem List Patient Active Problem List   Diagnosis Date Noted  . Long term (current) use of anticoagulants 06/24/2015  . Coronary artery disease involving native coronary artery of  native heart without angina pectoris 06/24/2015  . Chest pain 02/23/2013  . Ejection fraction   . Atrial fibrillation ([place])   . Drug intolerance   . Incomplete RBBB   . Carotid bruit   . Knee pain   . Anxiety   . hyperlipidemia   . Palpitations   . BPH (benign prostatic hypertrophy) 02/06/2009  . Personal history of colonic adenomas 11/29/2006    Madelyn Flavors PT 04-03-2017, 2:17 PM  [facility] [address] [place], [place], [address] Phone: [phone]   Fax:  [phone]  Name: Jordan [name] MRN: [id number] Date of Birth: [birth year]

## 2017-03-06 ENCOUNTER — Encounter: Payer: Self-pay | Admitting: Physical Therapy

## 2017-03-09 ENCOUNTER — Ambulatory Visit: Payer: Medicare Other | Admitting: Physical Therapy

## 2017-03-09 DIAGNOSIS — R600 Localized edema: Secondary | ICD-10-CM

## 2017-03-09 DIAGNOSIS — M25562 Pain in left knee: Secondary | ICD-10-CM

## 2017-03-09 DIAGNOSIS — M25661 Stiffness of right knee, not elsewhere classified: Secondary | ICD-10-CM

## 2017-03-09 NOTE — Therapy (Signed)
[facility] [place], [place], [address] Phone: [phone]   Fax:  [phone]  Physical Therapy Treatment  Patient Details  Name: Jordan [name] MRN: [id number] Date of Birth: 05-23-1946 Referring Provider: Jaynee Eagles  Encounter Date: 03/09/2017      PT End of Session - 03/09/17 0819    Visit Number 2   Number of Visits 16   Date for PT Re-Evaluation 04/30/17   PT Start Time 0818   PT Stop Time 0912   PT Time Calculation (min) 54 min   Activity Tolerance Patient tolerated treatment well   Behavior During Therapy [facility] for tasks assessed/performed      Past Medical History:  Diagnosis Date  . Anxiety    Mild  . Atrial fibrillation (HCC)    Paroxysmal  . BPH (benign prostatic hyperplasia)   . CAD (coronary artery disease)    Stent LAD, 2002  /  nuclear 2009, no ischemia  /    [facility] October, 2011 nuclear, no ischemia, possible slight apical scar, ejection fraction 70%  . Carotid bruit    Doppler November, 2011, normal  . Chronic anticoagulation    Low CHADS , score, but patient wants to be aggressive  . Colon polyps   . Drug intolerance    Mild beta blocker intolerance  . Dyslipidemia   . Ejection fraction    EF 65-70%, echo, 2007 /  EF 70%, nuclear, 2011  . Hyperlipidemia   . IBS (irritable bowel syndrome)   . Incomplete RBBB   . Knee pain    November, 201  . Personal history of colonic adenomas 11/29/2006   Qualifier: Diagnosis of  By: [place], [place]    . Syncope    November, 2010    Past Surgical History:  Procedure Laterality Date  . BACK SURGERY  05/1990   [id number]  . CARDIAC CATHETERIZATION    . COLONOSCOPY  multiple    There were no vitals filed for this visit.      Subjective Assessment - 03/09/17 0821    Subjective Patient reports knee pain of 6/10 today. He states the stretches help.   Pertinent History afib, CAD, chronic anticoagulation   Diagnostic tests xrays   Patient  Stated Goals able to ambulate without pain   Currently in Pain? Yes   Pain Score 6    Pain Location Knee   Pain Orientation Left   Pain Descriptors / Indicators Aching   Pain Type Acute pain                         [facility] Adult PT Treatment/Exercise - 03/09/17 0001      Knee/Hip Exercises: Stretches   Passive Hamstring Stretch Left;60 seconds;2 reps   Quad Stretch Left;3 reps;30 seconds     Knee/Hip Exercises: Aerobic   Stationary Bike L4 x 10 min     Modalities   Modalities Location manager Stimulation Location L knee IFC 1-10 Hz x 15 min   Electrical Stimulation Goals Edema;Pain     Vasopneumatic   Number Minutes Vasopneumatic  15 minutes   Vasopnuematic Location  Knee   Vasopneumatic Pressure Low   Vasopneumatic Temperature  34     Manual Therapy   Manual Therapy Soft tissue mobilization;Myofascial release   Soft tissue mobilization to L HS, gastroc and quads   Myofascial Release TPR to L quads  PT Long Term Goals - 03/05/17 1408      PT LONG TERM GOAL #1   Title I with advanced HEP   Time 8   Period Weeks   Status New   Target Date 04/30/17     PT LONG TERM GOAL #2   Title decreased pain in L knee with standing and walking to 2/10 or less   Time 8   Period Weeks   Status New   Target Date 04/30/17     PT LONG TERM GOAL #3   Title Decreased edema in L knee to within 0.5 cm of R knee   Time 8   Period Weeks   Status New   Target Date 04/30/17     PT LONG TERM GOAL #4   Title improved L knee flexion strength to 5-/5 or better   Time 8   Period Weeks   Status New   Target Date 04/30/17               Plan - 03/09/17 0859    Clinical Impression Statement Patient did well with PT today. He has TPs in L gastroc and quads that will benefit from DN.   PT Treatment/Interventions ADLs/Self Care Home Management;Cryotherapy;Electrical  Stimulation;Moist Heat;Ultrasound;Neuromuscular re-education;Therapeutic exercise;Patient/family education;Manual techniques;Passive range of motion;Dry needling;Therapeutic activities;Vasopneumatic Device   PT Next Visit Plan DN to quads and gastroc, STW to L HS, quads, gastroc, ITB; LE strengthening; modalities for pain/edema   PT Home Exercise Plan stretches for ITB, HS and quads with strap      Patient will benefit from skilled therapeutic intervention in order to improve the following deficits and impairments:  Pain, Increased edema, Decreased strength, Impaired flexibility, Decreased range of motion  Visit Diagnosis: Acute pain of left knee  Localized edema  Stiffness of right knee     Problem List Patient Active Problem List   Diagnosis Date Noted  . Long term (current) use of anticoagulants 06/24/2015  . 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 Chest pain 02/23/2013  . Ejection fraction   . Atrial fibrillation ([place])   . Drug intolerance   . Incomplete RBBB   . Carotid bruit   . Knee pain   . Anxiety   . hyperlipidemia   . Palpitations   . BPH (benign prostatic hypertrophy) 02/06/2009  . Personal history of colonic adenomas 11/29/2006    Madelyn Flavors PT 03/09/2017, 9:06 AM  [facility] [address] [place], [place], [address] Phone: [phone]   Fax:  [phone]  Name: Jordan [name] MRN: [id number] Date of Birth: 1946-01-21

## 2017-03-12 ENCOUNTER — Ambulatory Visit (INDEPENDENT_AMBULATORY_CARE_PROVIDER_SITE_OTHER): Payer: Medicare Other | Admitting: Cardiology

## 2017-03-12 ENCOUNTER — Encounter: Payer: Self-pay | Admitting: Cardiology

## 2017-03-12 VITALS — BP 128/76 | HR 64 | Ht 74.0 in | Wt 225.0 lb

## 2017-03-12 DIAGNOSIS — Z7901 Long term (current) use of anticoagulants: Secondary | ICD-10-CM | POA: Diagnosis not present

## 2017-03-12 DIAGNOSIS — I2510 Atherosclerotic heart disease of native coronary artery without angina pectoris: Secondary | ICD-10-CM | POA: Diagnosis not present

## 2017-03-12 DIAGNOSIS — I480 Paroxysmal atrial fibrillation: Secondary | ICD-10-CM | POA: Diagnosis not present

## 2017-03-12 DIAGNOSIS — E785 Hyperlipidemia, unspecified: Secondary | ICD-10-CM

## 2017-03-12 MED ORDER — ATORVASTATIN CALCIUM 40 MG PO TABS: 40.0000 mg | ORAL_TABLET | Freq: Every day | ORAL | 3 refills | Status: DC

## 2017-03-12 MED ORDER — DILTIAZEM HCL 60 MG PO TABS: 60.0000 mg | ORAL_TABLET | ORAL | 3 refills | Status: DC | PRN

## 2017-03-12 MED ORDER — NITROGLYCERIN 0.4 MG SL SUBL: 0.4000 mg | SUBLINGUAL_TABLET | SUBLINGUAL | 99 refills | Status: DC | PRN

## 2017-03-12 NOTE — Progress Notes (Signed)
Cardiology Office Note   Date:  03/12/2017   ID:  Jordan [name], DOB Oct 13, 1945, MRN [id number]  PCP:  Chipper Herb, MD  Cardiologist:   Candee Furbish, MD (Former Ron Parker)    History of Present Illness: Jordan [name] is a 71 y.o. male who presents for follow-up of coronary artery disease, paroxysmal atrial fibrillation. 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  Stent in 2002 to the LAD, nuclear in 2011 low risk. Possible slight apical scar, EF 70%.  Rare atrial fibrillation episode. Brief. Made a sleep apnea device himself. Compression hose for varicosity.   Bout of syncope in 2010.  No bleeding issues with anticoagulation.  Right knee pain. Dr. Jaynee Eagles. Meniscal tear. Physical therapy.   He takes his grandchildren on a ski trip every year. [place]  Having some heart burn after eating. Green tea. Still working but thinking to cut back.     Past Medical History:  Diagnosis Date  . Anxiety    Mild  . Atrial fibrillation (HCC)    Paroxysmal  . BPH (benign prostatic hyperplasia)   . CAD (coronary artery disease)    Stent LAD, 2002  /  nuclear 2009, no ischemia  /    [facility] October, 2011 nuclear, no ischemia, possible slight apical scar, ejection fraction 70%  . Carotid bruit    Doppler November, 2011, normal  . Chronic anticoagulation    Low CHADS , score, but patient wants to be aggressive  . Colon polyps   . Drug intolerance    Mild beta blocker intolerance  . Dyslipidemia   . Ejection fraction    EF 65-70%, echo, 2007 /  EF 70%, nuclear, 2011  . Hyperlipidemia   . IBS (irritable bowel syndrome)   . Incomplete RBBB   . Knee pain    November, 201  . Personal history of colonic adenomas 11/29/2006   Qualifier: Diagnosis of  By: [place], [place]    . Syncope    November, 2010    Past Surgical History:    Procedure Laterality Date  . BACK SURGERY  05/1990   [id number]  . CARDIAC CATHETERIZATION    . COLONOSCOPY  multiple     Current Outpatient Prescriptions  Medication Sig Dispense Refill  . apixaban (ELIQUIS) 5 MG TABS tablet Take 1 tablet (5 mg total) by mouth 2 (two) times daily. 60 tablet 11  . atorvastatin (LIPITOR) 40 MG tablet Take 1 tablet (40 mg total) by mouth daily at 6 PM. 90 tablet 3  . Calcium Carb-Cholecalciferol (CALCIUM 600+D3 PO) Take 1 capsule by mouth daily.     . carboxymethylcellulose (REFRESH PLUS) 0.5 % SOLN Place 1 drop into both eyes 3 (three) times daily as needed. For dry eyes    . Cholecalciferol (VITAMIN D3) 1000 UNITS CAPS Take 1,000 Units by mouth daily.     Marland Kitchen diltiazem (CARDIZEM CD) 180 MG 24 hr capsule Take 1 capsule (180 mg total) by mouth daily. 90 capsule 3  . diltiazem (CARDIZEM) 60 MG tablet Take 1 tablet (60 mg total) by mouth as needed. 90 tablet 3  . famotidine (PEPCID) 20 MG tablet Take 1 tablet (20 mg total) by mouth 2 (two) times daily.    . fish oil-omega-3 fatty acids 1000 MG capsule Take 1 g by mouth daily.     Marland Kitchen  Glucosamine-Chondroitin 1500-1200 MG/30ML LIQD Take 1,500 mg by mouth daily.     . Multiple Vitamins-Minerals (MULTIVITAMIN WITH MINERALS) tablet Take 1 tablet by mouth daily.     . nitroGLYCERIN (NITROSTAT) 0.4 MG SL tablet Place 1 tablet (0.4 mg total) under the tongue every 5 (five) minutes as needed for chest pain. 25 tablet prn  . Probiotic Product (ALIGN PO) Take 1 tablet by mouth daily.      No current facility-administered medications for this visit.     Allergies:   Levofloxacin; Lopid [gemfibrozil]; and Sulfonamide derivatives    Social History:  The patient  reports that he has never smoked. He has never used smokeless tobacco. He reports that he does not drink alcohol or use drugs.   Family History:  The patient's family history includes Heart attack in his brother and mother.   Mother - 48 MI Father - 49  died  ROS:  Please see the history of present illness.   Otherwise, review of systems are positive for none.   All other systems are reviewed and negative.    PHYSICAL EXAM: VS:  BP 128/76   Pulse 64   Ht 6\' 2"  (1.88 m)   Wt 225 lb (102.1 kg)   BMI 28.89 kg/m  , BMI Body mass index is 28.89 kg/m. GEN: Well nourished, well developed, in no acute distress  HEENT: normal  Neck: no JVD, very soft carotid bruits, or masses Cardiac: RRR; no murmurs, rubs, or gallops,no edema  Respiratory:  clear to auscultation bilaterally, normal work of breathing GI: soft, nontender, nondistended, + BS MS: no deformity or atrophy  Skin: warm and dry, no rash Neuro:  Alert and Oriented x 3, Strength and sensation are intact Psych: euthymic mood, full affect  EKG: 03/12/17 normal sinus rhythm and incomplete right bundle branch block no other abnormalities personally viewed-prior Prior reviewed, sinus bradycardia, nonspecific ST-T wave changes no significant change from prior.  ETT - 10/05/15 - low risk. 46min.   Recent Labs: 11/24/2016: ALT 25; BUN 20; Creatinine, Ser 1.09; Hemoglobin 15.4; Platelets 212; Potassium 4.4; Sodium 141    Lipid Panel    Component Value Date/Time   CHOL 124 11/24/2016 0934   CHOL 125 03/06/2013 0821   TRIG 80 11/24/2016 0934   TRIG 73 03/06/2013 0821   HDL 35 (L) 11/24/2016 0934   HDL 40 03/06/2013 0821   CHOLHDL 2.8 02/24/2013 0900   VLDL 16 02/24/2013 0900   LDLCALC 68 11/18/2013 0804   LDLCALC 70 03/06/2013 0821      Wt Readings from Last 3 Encounters:  03/12/17 225 lb (102.1 kg)  12/05/16 226 lb 8 oz (102.7 kg)  11/28/16 225 lb (102.1 kg)      Other studies Reviewed: Additional studies/ records that were reviewed today include: Prior office notes, lab, EKG, nuclear stress test Review of the above records demonstrates: As above   ASSESSMENT AND PLAN:  Coronary artery disease  - LAD stent in 2002  - Nuclear stress test 2011 low risk, no ischemia,  small apical defect  - Normal EF  - Aggressive secondary prevention  - No aspirin secondary to Eliquis  - Reassuring exercise treadmill test in April 2017, low risk, 9 minutes, no ischemic changes.No anginal symptoms. Doing well.  Paroxysmal atrial fibrillation  - Requested aggressive anticoagulation management  - CHADS VASc - 2, age, CAD  - Continue Eliquis, discussed benefits  - Lab work reviewed, creatinine 1.08, Hemoglobin normal  Chronic anticoagulation  - Eliquis, Doing  well without any complications. We did discuss potentially stopping his fish oil because of bleeding side effect especially with Eliquis.    Carotid bruit  - Prior Doppler normal 2011.  - Barely able to appreciate today on exam. Stable.  Hyperlipidemia  - Lab work reviewed as above. No change in therapy.  - In the past, used to take niacin. We discussed. We will give him atorvastatin 40 mg tablets. The 80 mg that he was cutting in half are sharp and can be abrasive.  Right knee pain  - Meniscal tear   Current medicines are reviewed at length with the patient today.  The patient does not have concerns regarding medicines.  The following changes have been made:  no change  Labs/ tests ordered today include: none   Orders Placed This Encounter  Procedures  . EKG 12-Lead     Disposition:   FU with Kathlen Mody in 6 months me in one year  Signed, Candee Furbish, MD  03/12/2017 3:08 PM    [facility] [place], [place], [place]  [address] Phone: [phone]; Fax: [phone]

## 2017-03-12 NOTE — Patient Instructions (Signed)
Medication Instructions:  Please take 40 mg tablets of Atorvastatin. Continue all other medications as listed.  Follow-Up: Follow up in 1 year with Dr. Marlou Porch.  You will receive a letter in the mail 2 months before you are due.  Please call us when you receive this letter to schedule your follow up appointment.  If you need a refill on your cardiac medications before your next appointment, please call your pharmacy.  Thank you for choosing [facility]!!

## 2017-03-13 ENCOUNTER — Ambulatory Visit: Payer: Medicare Other | Attending: Sports Medicine | Admitting: Physical Therapy

## 2017-03-13 DIAGNOSIS — M25661 Stiffness of right knee, not elsewhere classified: Secondary | ICD-10-CM | POA: Insufficient documentation

## 2017-03-13 DIAGNOSIS — M25562 Pain in left knee: Secondary | ICD-10-CM

## 2017-03-13 DIAGNOSIS — R600 Localized edema: Secondary | ICD-10-CM | POA: Diagnosis not present

## 2017-03-13 NOTE — Patient Instructions (Addendum)
Trigger Point Dry Needling  . What is Trigger Point Dry Needling (DN)? o DN is a physical therapy technique used to treat muscle pain and dysfunction. Specifically, DN helps deactivate muscle trigger points (muscle knots).  o A thin filiform needle is used to penetrate the skin and stimulate the underlying trigger point. The goal is for a local twitch response (LTR) to occur and for the trigger point to relax. No medication of any kind is injected during the procedure.   . What Does Trigger Point Dry Needling Feel Like?  o The procedure feels different for each individual patient. Some patients report that they do not actually feel the needle enter the skin and overall the process is not painful. Very mild bleeding may occur. However, many patients feel a deep cramping in the muscle in which the needle was inserted. This is the local twitch response.   Marland Kitchen How Will I feel after the treatment? o Soreness is normal, and the onset of soreness may not occur for a few hours. Typically this soreness does not last longer than two days.  o Bruising is uncommon, however; ice can be used to decrease any possible bruising.  o In rare cases feeling tired or nauseous after the treatment is normal. In addition, your symptoms may get worse before they get better, this period will typically not last longer than 24 hours.   . What Can I do After My Treatment? o Increase your hydration by drinking more water for the next 24 hours. o You may place heat on the areas treated that have become sore, however, do not use heat on inflamed or bruised areas. Heat often brings more relief post needling. o You can continue your regular activities, but vigorous activity is not recommended initially after the treatment for 24 hours. o DN is best combined with other physical therapy such as strengthening, stretching, and other therapies.    Precautions:  In some cases, dry needling is done over the lung field. While rare, there  is a risk of pneumothorax (punctured lung). Because of this, if you ever experience shortness of breath on exertion, difficulty taking a deep breath, chest pain or a dry cough following dry needling, you should report to an emergency room and tell them that you have been dry needled over the thorax.  Madelyn Flavors, PT 03/13/17 3:22 PM; [facility] [place], [place], [address] Phone: [phone]   Fax:  [phone]

## 2017-03-13 NOTE — Therapy (Signed)
[facility] [place], [place], [address] Phone: [phone]   Fax:  [phone]  Physical Therapy Treatment  Patient Details  Name: Jordan [name] MRN: [id number] Date of Birth: 10/15/1945 Referring Provider: Jaynee Eagles  Encounter Date: 03/13/2017      PT End of Session - 03/13/17 1437    Visit Number 3   Number of Visits 16   Date for PT Re-Evaluation 04/30/17   PT Start Time 1430   PT Stop Time 1534   PT Time Calculation (min) 64 min   Activity Tolerance Patient tolerated treatment well   Behavior During Therapy [facility] for tasks assessed/performed      Past Medical History:  Diagnosis Date  . Anxiety    Mild  . Atrial fibrillation (HCC)    Paroxysmal  . BPH (benign prostatic hyperplasia)   . CAD (coronary artery disease)    Stent LAD, 2002  /  nuclear 2009, no ischemia  /    [facility] October, 2011 nuclear, no ischemia, possible slight apical scar, ejection fraction 70%  . Carotid bruit    Doppler November, 2011, normal  . Chronic anticoagulation    Low CHADS , score, but patient wants to be aggressive  . Colon polyps   . Drug intolerance    Mild beta blocker intolerance  . Dyslipidemia   . Ejection fraction    EF 65-70%, echo, 2007 /  EF 70%, nuclear, 2011  . Hyperlipidemia   . IBS (irritable bowel syndrome)   . Incomplete RBBB   . Knee pain    November, 201  . Personal history of colonic adenomas 11/29/2006   Qualifier: Diagnosis of  By: [place], [place]    . Syncope    November, 2010    Past Surgical History:  Procedure Laterality Date  . BACK SURGERY  05/1990   L4L5S1  . CARDIAC CATHETERIZATION    . COLONOSCOPY  multiple    There were no vitals filed for this visit.      Subjective Assessment - 03/13/17 1437    Subjective Patient reports his knee is about the same although the swelling has decreased.   Pertinent History afib, CAD, chronic anticoagulation   Diagnostic tests xrays   Patient Stated Goals able to ambulate without pain   Currently in Pain? Yes   Pain Score 6    Pain Location Knee   Pain Orientation Left   Pain Descriptors / Indicators Aching   Pain Type Acute pain   Pain Onset 1 to 4 weeks ago   Pain Frequency Intermittent   Aggravating Factors  moving laterally, walking, after prolonged sitting   Pain Relieving Factors ice, rest            [facility] PT Assessment - 03/13/17 0001      Observation/Other Assessments-Edema    Edema Circumferential     Circumferential Edema   Circumferential - Left  46 cm     AROM   AROM Assessment Site Knee   Right/Left Knee Left   Left Knee Flexion 120                     [facility] Adult PT Treatment/Exercise - 03/13/17 0001      Self-Care   Self-Care Other Self-Care Comments   Other Self-Care Comments  DN education and aftercare     Modalities   Modalities Electrical Stimulation;Moist Heat;Vasopneumatic     Moist Heat Therapy   Number Minutes Moist Heat 15 Minutes  Moist Heat Location Other (comment)  L quad     Electrical Stimulation   Electrical Stimulation Location L knee IFC 1-10 Hz x 15 min   Electrical Stimulation Goals Edema;Pain     Vasopneumatic   Number Minutes Vasopneumatic  15 minutes   Vasopnuematic Location  Knee   Vasopneumatic Pressure Low   Vasopneumatic Temperature  34     Manual Therapy   Manual Therapy Soft tissue mobilization   Soft tissue mobilization to L quads, HS, ADD and gastroc          Trigger Point Dry Needling - 03/13/17 1520    Consent Given? Yes   Education Handout Provided Yes   Muscles Treated Lower Body Quadriceps;Adductor longus/brevius/maximus;Hamstring;Gastrocnemius  L   Quadriceps Response Twitch response elicited;Palpable increased muscle length   Adductor Response Twitch response elicited;Palpable increased muscle length   Hamstring Response Twitch response elicited;Palpable increased muscle length   Gastrocnemius Response Twitch  response elicited;Palpable increased muscle length                   PT Long Term Goals - 03/13/17 1525      PT LONG TERM GOAL #1   Title I with advanced HEP   Period Weeks   Status On-going     PT LONG TERM GOAL #2   Title decreased pain in L knee with standing and walking to 2/10 or less   Time 8   Period Weeks   Status On-going     PT LONG TERM GOAL #3   Title Decreased edema in L knee to within 0.5 cm of R knee   Baseline L knee 46 cm as of 03/13/17   Time 8   Period Weeks   Status On-going               Plan - 03/13/17 1526    Clinical Impression Statement Patient reports no signicant change in pain level in his L knee, but reports decreased swelling. He gave consent for DN and responded well with ++ twitch response in L gastroc and quads. LTGs are ongoing.   Rehab Potential Excellent   PT Frequency 2x / week   PT Duration 8 weeks   PT Treatment/Interventions ADLs/Self Care Home Management;Cryotherapy;Electrical Stimulation;Moist Heat;Ultrasound;Neuromuscular re-education;Therapeutic exercise;Patient/family education;Manual techniques;Passive range of motion;Dry needling;Therapeutic activities;Vasopneumatic Device   PT Next Visit Plan Assesss DN to quads and gastroc, STW to L HS, quads, gastroc, ITB; LE strengthening; modalities for pain/edema   PT Home Exercise Plan stretches for ITB, HS and quads with strap   Consulted and Agree with Plan of Care Patient      Patient will benefit from skilled therapeutic intervention in order to improve the following deficits and impairments:  Pain, Increased edema, Decreased strength, Impaired flexibility, Decreased range of motion  Visit Diagnosis: Acute pain of left knee  Localized edema  Stiffness of right knee     Problem List Patient Active Problem List   Diagnosis Date Noted  . Long term (current) use of anticoagulants 06/24/2015  . Coronary artery disease involving native coronary artery of native  heart without angina pectoris 06/24/2015  . Chest pain 02/23/2013  . Ejection fraction   . Atrial fibrillation ([place])   . Drug intolerance   . Incomplete RBBB   . Carotid bruit   . Knee pain   . Anxiety   . hyperlipidemia   . Palpitations   . BPH (benign prostatic hypertrophy) 02/06/2009  . Personal history of colonic adenomas 11/29/2006  Madelyn Flavors PT 03/13/2017, 3:44 PM  [facility] [place], [place], [address] Phone: [phone]   Fax:  [phone]  Name: Jordan [name] MRN: [id number] Date of Birth: [birth year]

## 2017-03-15 ENCOUNTER — Encounter: Payer: Self-pay | Admitting: Physical Therapy

## 2017-03-20 ENCOUNTER — Ambulatory Visit: Payer: Medicare Other | Admitting: Physical Therapy

## 2017-03-20 DIAGNOSIS — M25562 Pain in left knee: Secondary | ICD-10-CM | POA: Diagnosis not present

## 2017-03-20 DIAGNOSIS — R600 Localized edema: Secondary | ICD-10-CM | POA: Diagnosis not present

## 2017-03-20 DIAGNOSIS — M25661 Stiffness of right knee, not elsewhere classified: Secondary | ICD-10-CM

## 2017-03-20 NOTE — Therapy (Signed)
[facility] [place], [place], [address] Phone: [phone]   Fax:  [phone]  Physical Therapy Treatment  Patient Details  Name: Jordan [name] MRN: [id number] Date of Birth: 11-30-1945 Referring Provider: Jaynee Eagles  Encounter Date: 03/20/2017      PT End of Session - 03/20/17 1123    Visit Number 4   Date for PT Re-Evaluation 04/30/17   PT Start Time 1120   PT Stop Time 1216   PT Time Calculation (min) 56 min   Activity Tolerance Patient tolerated treatment well   Behavior During Therapy [facility] for tasks assessed/performed      Past Medical History:  Diagnosis Date  . Anxiety    Mild  . Atrial fibrillation (HCC)    Paroxysmal  . BPH (benign prostatic hyperplasia)   . CAD (coronary artery disease)    Stent LAD, 2002  /  nuclear 2009, no ischemia  /    [facility] October, 2011 nuclear, no ischemia, possible slight apical scar, ejection fraction 70%  . Carotid bruit    Doppler November, 2011, normal  . Chronic anticoagulation    Low CHADS , score, but patient wants to be aggressive  . Colon polyps   . Drug intolerance    Mild beta blocker intolerance  . Dyslipidemia   . Ejection fraction    EF 65-70%, echo, 2007 /  EF 70%, nuclear, 2011  . Hyperlipidemia   . IBS (irritable bowel syndrome)   . Incomplete RBBB   . Knee pain    November, 201  . Personal history of colonic adenomas 11/29/2006   Qualifier: Diagnosis of  By: [place], [place]    . Syncope    November, 2010    Past Surgical History:  Procedure Laterality Date  . BACK SURGERY  05/1990   L4L5S1  . CARDIAC CATHETERIZATION    . COLONOSCOPY  multiple    There were no vitals filed for this visit.      Subjective Assessment - 03/20/17 1124    Subjective The DN really helped after a few days. He states his mobility and gait has improved. He can also stand with less pain. Prolonged sitting to standing is where he gets most pain.   Diagnostic  tests xrays   Patient Stated Goals able to ambulate without pain   Currently in Pain? Yes   Pain Score 4    Pain Location Knee   Pain Orientation Left   Pain Descriptors / Indicators Aching   Pain Type Acute pain   Pain Onset 1 to 4 weeks ago   Pain Frequency Intermittent                         [facility] Adult PT Treatment/Exercise - 03/20/17 0001      Knee/Hip Exercises: Stretches   Sports administrator Left;2 reps;30 seconds     Knee/Hip Exercises: Aerobic   Stationary Bike L5 x 15 min     Moist Heat Therapy   Number Minutes Moist Heat 15 Minutes   Moist Heat Location Other (comment)  L quad     Electrical Stimulation   Electrical Stimulation Location L Rectus fem; L knee   Electrical Stimulation Action premod   Electrical Stimulation Parameters 80-150 Hz Ch 1; 1-10 Hz Ch 2   Electrical Stimulation Goals Edema;Pain     Manual Therapy   Manual Therapy Soft tissue mobilization   Soft tissue mobilization to left quads, adductors, HS and  gastroc          Trigger Point Dry Needling - 03/20/17 1206    Consent Given? Yes   Education Handout Provided No   Muscles Treated Lower Body Quadriceps;Adductor longus/brevius/maximus;Hamstring;Gastrocnemius  Left   Quadriceps Response Twitch response elicited;Palpable increased muscle length  RF   Adductor Response Twitch response elicited;Palpable increased muscle length   Hamstring Response Twitch response elicited;Palpable increased muscle length  semitendonosis   Gastrocnemius Response Twitch response elicited;Palpable increased muscle length  medial                   PT Long Term Goals - 03/20/17 1213      PT LONG TERM GOAL #1   Title I with advanced HEP   Time 8   Period Weeks   Status On-going     PT LONG TERM GOAL #2   Title decreased pain in L knee with standing and walking to 2/10 or less   Time 8   Period Weeks   Status On-going     PT LONG TERM GOAL #3   Title Decreased edema in L knee  to within 0.5 cm of R knee   Baseline L knee 46 cm as of 03/20/17   Time 8   Period Weeks   Status On-going               Plan - 03/20/17 1215    Clinical Impression Statement Patient is progresing with decreased pain and swelling reported. He reports gait and mobility has improved as well.   Rehab Potential Excellent   PT Frequency 2x / week   PT Duration 8 weeks   PT Treatment/Interventions ADLs/Self Care Home Management;Cryotherapy;Electrical Stimulation;Moist Heat;Ultrasound;Neuromuscular re-education;Therapeutic exercise;Patient/family education;Manual techniques;Passive range of motion;Dry needling;Therapeutic activities;Vasopneumatic Device   PT Next Visit Plan Assesss DN to quads and gastroc, STW to L HS, quads, gastroc, ITB; LE strengthening; modalities for pain/edema.    PT Home Exercise Plan stretches for ITB, HS and quads with strap      Patient will benefit from skilled therapeutic intervention in order to improve the following deficits and impairments:  Pain, Increased edema, Decreased strength, Impaired flexibility, Decreased range of motion  Visit Diagnosis: Acute pain of left knee  Localized edema  Stiffness of right knee     Problem List Patient Active Problem List   Diagnosis Date Noted  . Long term (current) use of anticoagulants 06/24/2015  . 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 Chest pain 02/23/2013  . Ejection fraction   . Atrial fibrillation ([place])   . Drug intolerance   . Incomplete RBBB   . Carotid bruit   . Knee pain   . Anxiety   . hyperlipidemia   . Palpitations   . BPH (benign prostatic hypertrophy) 02/06/2009  . Personal history of colonic adenomas 11/29/2006    Madelyn Flavors PT 03/20/2017, 12:26 PM  [facility] [address] [place], [place], [address] Phone: [phone]   Fax:  [phone]  Name: Jordan [name] MRN: [id number] Date of Birth: [birth year]

## 2017-04-03 ENCOUNTER — Ambulatory Visit: Payer: Medicare Other | Admitting: Physical Therapy

## 2017-04-03 DIAGNOSIS — M25661 Stiffness of right knee, not elsewhere classified: Secondary | ICD-10-CM | POA: Diagnosis not present

## 2017-04-03 DIAGNOSIS — M25562 Pain in left knee: Secondary | ICD-10-CM | POA: Diagnosis not present

## 2017-04-03 DIAGNOSIS — R600 Localized edema: Secondary | ICD-10-CM | POA: Diagnosis not present

## 2017-04-03 NOTE — Patient Instructions (Signed)
ADDUCTION: Standing - Stable (Active)    Stand,leftt leg out to side as far as possible. Draw leg in across midline. Use _0__ lbs. Complete _2__ sets of 10___ repetitions. Perform _1__ sessions per day.  http://gtsc.exer.us/144   Copyright  VHI. All rights reserved.    Straight Leg Raise: With External Leg Rotation    Lie on back with left leg straight, opposite leg bent. Rotate straight leg out and lift _6-10___ inches. Repeat __10__ times per set. Do _3___ sets per session. Do 1____ sessions per day.  http://orth.exer.us/728   Copyright  VHI. All rights reserved.   Madelyn Flavors, PT 04/03/17 12:07 PM;[facility] [place], [place], [address] Phone: [phone]   Fax:  [phone]

## 2017-04-03 NOTE — Therapy (Signed)
[facility] [place], [place], [address] Phone: [phone]   Fax:  [phone]  Physical Therapy Treatment  Patient Details  Name: Jordan [name] MRN: [id number] Date of Birth: 1946-02-06 Referring Provider: Jaynee Eagles  Encounter Date: 04/03/2017      PT End of Session - 04/03/17 1126    Visit Number 5   Number of Visits 16   Date for PT Re-Evaluation 04/30/17   PT Start Time 1115   PT Stop Time 1213   PT Time Calculation (min) 58 min   Activity Tolerance Patient tolerated treatment well   Behavior During Therapy [facility] for tasks assessed/performed      Past Medical History:  Diagnosis Date  . Anxiety    Mild  . Atrial fibrillation (HCC)    Paroxysmal  . BPH (benign prostatic hyperplasia)   . CAD (coronary artery disease)    Stent LAD, 2002  /  nuclear 2009, no ischemia  /    [facility] October, 2011 nuclear, no ischemia, possible slight apical scar, ejection fraction 70%  . Carotid bruit    Doppler November, 2011, normal  . Chronic anticoagulation    Low CHADS , score, but patient wants to be aggressive  . Colon polyps   . Drug intolerance    Mild beta blocker intolerance  . Dyslipidemia   . Ejection fraction    EF 65-70%, echo, 2007 /  EF 70%, nuclear, 2011  . Hyperlipidemia   . IBS (irritable bowel syndrome)   . Incomplete RBBB   . Knee pain    November, 201  . Personal history of colonic adenomas 11/29/2006   Qualifier: Diagnosis of  By: [place], [place]    . Syncope    November, 2010    Past Surgical History:  Procedure Laterality Date  . BACK SURGERY  05/1990   [id number]  . CARDIAC CATHETERIZATION    . COLONOSCOPY  multiple    There were no vitals filed for this visit.      Subjective Assessment - 04/03/17 1127    Subjective Patient states his knee is better overall. He was on vacation and able to walk in the sand. He still feels discomfort across the left patella.    Pertinent History  afib, CAD, chronic anticoagulation   Diagnostic tests xrays   Patient Stated Goals able to ambulate without pain   Currently in Pain? Yes   Pain Score 3    Pain Location Knee   Pain Orientation Left   Pain Descriptors / Indicators Discomfort   Pain Type Acute pain   Pain Onset More than a month ago   Pain Frequency Intermittent   Aggravating Factors  standing after prolonged sitting   Pain Relieving Factors ice, rest                         [facility] Adult PT Treatment/Exercise - 04/03/17 0001      Knee/Hip Exercises: Aerobic   Stationary Bike L5 x 15 min     Knee/Hip Exercises: Supine   Straight Leg Raises Strengthening;1 set;10 reps;Left   Straight Leg Raise with External Rotation Strengthening;Left;2 sets;10 reps     Knee/Hip Exercises: Sidelying   Hip ADduction Strengthening;Left;2 sets;10 reps   Hip ADduction Limitations difficult; did standing ADD x 15 reps before second set in [facility]     Modalities   Modalities Electrical Stimulation;Moist Heat     Moist Heat Therapy   Number Minutes Moist  Heat 15 Minutes   Moist Heat Location Other (comment)  thigh     Electrical Stimulation   Electrical Stimulation Location L rectus femoris   Electrical Stimulation Action premod   Electrical Stimulation Parameters 80-150 Hz x 15 min   Electrical Stimulation Goals Edema;Pain     Manual Therapy   Manual Therapy Soft tissue mobilization;Myofascial release   Soft tissue mobilization to left quads   Myofascial Release to left rectus femoris          Trigger Point Dry Needling - 04/03/17 1202    Consent Given? Yes   Education Handout Provided No   Muscles Treated Lower Body Quadriceps   Quadriceps Response Twitch response elicited;Palpable increased muscle length  L              PT Education - 04/03/17 1207    Education provided Yes   Education Details HEP   Person(s) Educated Patient   Methods Demonstration;Explanation;Handout;Verbal cues    Comprehension Verbalized understanding;Returned demonstration             PT Long Term Goals - 04/03/17 1130      PT LONG TERM GOAL #1   Title I with advanced HEP   Time 8   Period Weeks   Status On-going     PT LONG TERM GOAL #2   Title decreased pain in L knee with standing and walking to 2/10 or less   Baseline 3/10 as of 04/02/17   Time 8   Period Weeks   Status On-going     PT LONG TERM GOAL #3   Title Decreased edema in L knee to within 0.5 cm of R knee   Baseline 45.5 cm L vs. 44 cm R   Time 8   Period Weeks   Status On-going               Plan - 04/03/17 1204    Clinical Impression Statement Patient presents today with reports of decreased pain overall in his L knee. He demonstrates weakness in L hip adductors and medial quads as well. Swelling is decreasing. LTGs are ongoing.   Rehab Potential Excellent   PT Frequency 2x / week   PT Duration 8 weeks   PT Treatment/Interventions ADLs/Self Care Home Management;Cryotherapy;Electrical Stimulation;Moist Heat;Ultrasound;Neuromuscular re-education;Therapeutic exercise;Patient/family education;Manual techniques;Passive range of motion;Dry needling;Therapeutic activities;Vasopneumatic Device   PT Next Visit Plan Assesss DN to quads and gastroc, STW to L HS, quads, gastroc, ITB; LE strengthening; modalities for pain/edema.    PT Home Exercise Plan SLR with ER, standing hip ADD, stretches for ITB, HS and quads with strap   Consulted and Agree with Plan of Care Patient      Patient will benefit from skilled therapeutic intervention in order to improve the following deficits and impairments:  Pain, Increased edema, Decreased strength, Impaired flexibility, Decreased range of motion  Visit Diagnosis: Acute pain of left knee     Problem List Patient Active Problem List   Diagnosis Date Noted  . Long term (current) use of anticoagulants 06/24/2015  . Coronary artery disease involving native coronary artery of  native heart without angina pectoris 06/24/2015  . Chest pain 02/23/2013  . Ejection fraction   . Atrial fibrillation ([place])   . Drug intolerance   . Incomplete RBBB   . Carotid bruit   . Knee pain   . Anxiety   . hyperlipidemia   . Palpitations   . BPH (benign prostatic hypertrophy) 02/06/2009  . Personal history of colonic  adenomas 11/29/2006    Madelyn Flavors PT 04/03/2017, 12:10 PM  [facility] [address] [place], [place], [address] Phone: [phone]   Fax:  [phone]  Name: Jordan [name] MRN: [id number] Date of Birth: [birth year]

## 2017-04-10 ENCOUNTER — Ambulatory Visit: Payer: Medicare Other | Admitting: Physical Therapy

## 2017-04-10 DIAGNOSIS — R600 Localized edema: Secondary | ICD-10-CM | POA: Diagnosis not present

## 2017-04-10 DIAGNOSIS — M25562 Pain in left knee: Secondary | ICD-10-CM | POA: Diagnosis not present

## 2017-04-10 DIAGNOSIS — M25661 Stiffness of right knee, not elsewhere classified: Secondary | ICD-10-CM | POA: Diagnosis not present

## 2017-04-10 NOTE — Therapy (Signed)
[facility] [place], [place], [address] Phone: [phone]   Fax:  [phone]  Physical Therapy Treatment  Patient Details  Name: SEVEN DOLLENS MRN: [id number] Date of Birth: Feb 13, 1946 Referring Provider: Jaynee Eagles  Encounter Date: 04/10/2017      PT End of Session - 04/10/17 1121    Visit Number 6   Number of Visits 16   Date for PT Re-Evaluation 04/30/17   PT Start Time 1120   PT Stop Time 1217   PT Time Calculation (min) 57 min   Activity Tolerance Patient tolerated treatment well   Behavior During Therapy [facility] for tasks assessed/performed      Past Medical History:  Diagnosis Date  . Anxiety    Mild  . Atrial fibrillation (HCC)    Paroxysmal  . BPH (benign prostatic hyperplasia)   . CAD (coronary artery disease)    Stent LAD, 2002  /  nuclear 2009, no ischemia  /    [facility] October, 2011 nuclear, no ischemia, possible slight apical scar, ejection fraction 70%  . Carotid bruit    Doppler November, 2011, normal  . Chronic anticoagulation    Low CHADS , score, but patient wants to be aggressive  . Colon polyps   . Drug intolerance    Mild beta blocker intolerance  . Dyslipidemia   . Ejection fraction    EF 65-70%, echo, 2007 /  EF 70%, nuclear, 2011  . Hyperlipidemia   . IBS (irritable bowel syndrome)   . Incomplete RBBB   . Knee pain    November, 201  . Personal history of colonic adenomas 11/29/2006   Qualifier: Diagnosis of  By: [place], [place]    . Syncope    November, 2010    Past Surgical History:  Procedure Laterality Date  . BACK SURGERY  05/1990   [id number]  . CARDIAC CATHETERIZATION    . COLONOSCOPY  multiple    There were no vitals filed for this visit.      Subjective Assessment - 04/10/17 1121    Subjective Patient reported he had some deep tissue work done last week and that helped his hamstrings.    Pertinent History afib, CAD, chronic anticoagulation   Diagnostic  tests xrays   Patient Stated Goals able to ambulate without pain   Currently in Pain? Yes   Pain Score 3    Pain Location Knee   Pain Orientation Left;Medial   Pain Descriptors / Indicators Discomfort   Pain Type Acute pain   Pain Onset More than a month ago   Pain Frequency Intermittent   Aggravating Factors  standing after prolonged sitting   Pain Relieving Factors ice, rest                         [facility] Adult PT Treatment/Exercise - 04/10/17 0001      Knee/Hip Exercises: Aerobic   Stationary Bike L5 x 15 min     Knee/Hip Exercises: Supine   Bridges Limitations Bridge x 5; with alt knee ext x 5 each   Straight Leg Raise with External Rotation Strengthening;Left;2 sets;15 reps     Modalities   Modalities Electrical Stimulation;Moist Heat     Moist Heat Therapy   Number Minutes Moist Heat 15 Minutes   Moist Heat Location Knee     Electrical Stimulation   Electrical Stimulation Location L knee   Electrical Stimulation Action IFC   Electrical Stimulation Parameters 80-150 Hz  x 15 min   Electrical Stimulation Goals Pain     Manual Therapy   Manual Therapy Soft tissue mobilization;Myofascial release   Soft tissue mobilization to left quads   Myofascial Release to left ITB                PT Education - 04/10/17 1206    Education provided Yes   Education Details HEP   Person(s) Educated Patient   Methods Explanation;Demonstration   Comprehension Verbalized understanding;Returned demonstration             PT Long Term Goals - 04/03/17 1130      PT LONG TERM GOAL #1   Title I with advanced HEP   Time 8   Period Weeks   Status On-going     PT LONG TERM GOAL #2   Title decreased pain in L knee with standing and walking to 2/10 or less   Baseline 3/10 as of 04/02/17   Time 8   Period Weeks   Status On-going     PT LONG TERM GOAL #3   Title Decreased edema in L knee to within 0.5 cm of R knee   Baseline 45.5 cm L vs. 44 cm R    Time 8   Period Weeks   Status On-going               Plan - 04/10/17 1206    Clinical Impression Statement Patient did well with PT today. He had increased tenderness at L VLO tendon today and had good twitch response in L VLO with DN. He states his knee is much better particularly in the morning when getting up. LTGs are ongoing.   PT Treatment/Interventions ADLs/Self Care Home Management;Cryotherapy;Electrical Stimulation;Moist Heat;Ultrasound;Neuromuscular re-education;Therapeutic exercise;Patient/family education;Manual techniques;Passive range of motion;Dry needling;Therapeutic activities;Vasopneumatic Device   PT Next Visit Plan STW to L HS, quads, gastroc, ITB; LE strengthening; modalities for pain/edema.    PT Home Exercise Plan prone HS curl with band, SLR with ER, standing hip ADD, stretches for ITB, HS and quads with strap   Consulted and Agree with Plan of Care Patient      Patient will benefit from skilled therapeutic intervention in order to improve the following deficits and impairments:  Pain, Increased edema, Decreased strength, Impaired flexibility, Decreased range of motion  Visit Diagnosis: Acute pain of left knee  Localized edema     Problem List Patient Active Problem List   Diagnosis Date Noted  . Long term (current) use of anticoagulants 06/24/2015  . 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 Chest pain 02/23/2013  . Ejection fraction   . Atrial fibrillation ([place])   . Drug intolerance   . Incomplete RBBB   . Carotid bruit   . Knee pain   . Anxiety   . hyperlipidemia   . Palpitations   . BPH (benign prostatic hypertrophy) 02/06/2009  . Personal history of colonic adenomas 11/29/2006    Madelyn Flavors PT 04/10/2017, 12:18 PM  [facility] [address] [place], [place], [address] Phone: [phone]   Fax:  [phone]  Name:  KEKAI GETER MRN: [id number] Date of Birth: 24-Mar-1946

## 2017-04-10 NOTE — Patient Instructions (Signed)
    Madelyn Flavors, PT 04/10/17 12:05 PM [facility] [place], [place], [address] Phone: [phone]   Fax:  [phone]

## 2017-04-16 ENCOUNTER — Ambulatory Visit: Payer: Medicare Other | Attending: Sports Medicine | Admitting: Physical Therapy

## 2017-04-16 DIAGNOSIS — M25562 Pain in left knee: Secondary | ICD-10-CM | POA: Insufficient documentation

## 2017-04-16 DIAGNOSIS — R600 Localized edema: Secondary | ICD-10-CM | POA: Insufficient documentation

## 2017-04-16 NOTE — Therapy (Signed)
[facility] [place], [place], [address] Phone: [phone]   Fax:  [phone]  Physical Therapy Treatment  Patient Details  Name: Jordan [name] MRN: [id number] Date of Birth: [birth year] Referring Provider: Jaynee Eagles   Encounter Date: 04/16/2017  PT End of Session - 04/16/17 1305    Visit Number  7    Number of Visits  16    Date for PT Re-Evaluation  04/30/17    PT Start Time  1300    PT Stop Time  1402    PT Time Calculation (min)  62 min    Activity Tolerance  Patient tolerated treatment well    Behavior During Therapy  [facility] for tasks assessed/performed       Past Medical History:  Diagnosis Date  . Anxiety    Mild  . Atrial fibrillation (HCC)    Paroxysmal  . BPH (benign prostatic hyperplasia)   . CAD (coronary artery disease)    Stent LAD, 2002  /  nuclear 2009, no ischemia  /    [facility] October, 2011 nuclear, no ischemia, possible slight apical scar, ejection fraction 70%  . Carotid bruit    Doppler November, 2011, normal  . Chronic anticoagulation    Low CHADS , score, but patient wants to be aggressive  . Colon polyps   . Drug intolerance    Mild beta blocker intolerance  . Dyslipidemia   . Ejection fraction    EF 65-70%, echo, 2007 /  EF 70%, nuclear, 2011  . Hyperlipidemia   . IBS (irritable bowel syndrome)   . Incomplete RBBB   . Knee pain    November, 201  . Personal history of colonic adenomas 11/29/2006   Qualifier: Diagnosis of  By: [place], [place]    . Syncope    November, 2010    Past Surgical History:  Procedure Laterality Date  . BACK SURGERY  05/1990   L4L5S1  . CARDIAC CATHETERIZATION    . COLONOSCOPY  multiple    There were no vitals filed for this visit.  Subjective Assessment - 04/16/17 1305    Subjective  Patient states that he started using some Hemp cream on his left knee and it has really helped.     Pertinent History  afib, CAD, chronic anticoagulation     Diagnostic tests  xrays    Patient Stated Goals  able to ambulate without pain    Currently in Pain?  Yes    Pain Score  2     Pain Location  Knee    Pain Orientation  Left    Pain Descriptors / Indicators  Discomfort    Pain Type  Acute pain    Pain Onset  More than a month ago    Pain Frequency  Intermittent    Aggravating Factors   standing after prolonged sitting    Pain Relieving Factors  ice, rest                      [facility] Adult PT Treatment/Exercise - 04/16/17 0001      Knee/Hip Exercises: Aerobic   Stationary Bike  L6 x 15 min      Knee/Hip Exercises: Standing   Heel Raises  Both;2 sets;15 reps straight and toes out   straight and toes out   Lateral Step Up  Both;1 set;10 reps;Hand Hold: 2    Forward Step Up  Hand Hold: 1;Both;10 reps;1 set onto Bosu   onto  Bosu   Functional Squat  10 reps;2 sets to mat table, hurts by last rep   to mat table, hurts by last rep   SLS  with UE rows green band x 15 each side    SLS with Vectors  Bil multiple reps fwd/side/behind      Modalities   Modalities  Electrical Stimulation;Moist Heat      Moist Heat Therapy   Number Minutes Moist Heat  15 Minutes    Moist Heat Location  Knee      Electrical Stimulation   Electrical Stimulation Location  L knee    Electrical Stimulation Action  premod    Electrical Stimulation Parameters  80-150 Hz x 15 min    Electrical Stimulation Goals  Pain                  PT Long Term Goals - 04/16/17 1356      PT LONG TERM GOAL #1   Title  I with advanced HEP    Time  8    Period  Weeks    Status  On-going      PT LONG TERM GOAL #2   Title  decreased pain in L knee with standing and walking to 2/10 or less    Baseline  2/10 as of 04/16/17    Time  8    Period  Weeks    Status Achieved     PT LONG TERM GOAL #4   Title  improved L knee flexion strength to 5-/5 or better    Time  8    Period  Weeks    Status  On-going            Plan - 04/16/17 1355     Clinical Impression Statement  Patient did very well with strengthening and balance activities today.     Rehab Potential  Excellent    PT Frequency  2x / week    PT Duration  8 weeks    PT Treatment/Interventions  ADLs/Self Care Home Management;Cryotherapy;Electrical Stimulation;Moist Heat;Ultrasound;Neuromuscular re-education;Therapeutic exercise;Patient/family education;Manual techniques;Passive range of motion;Dry needling;Therapeutic activities;Vasopneumatic Device    PT Next Visit Plan  LE strengthening/balance; modalities for pain/edema.     PT Home Exercise Plan  prone HS curl with band, SLR with ER, standing hip ADD, stretches for ITB, HS and quads with strap       Patient will benefit from skilled therapeutic intervention in order to improve the following deficits and impairments:  Pain, Increased edema, Decreased strength, Impaired flexibility, Decreased range of motion  Visit Diagnosis: Acute pain of left knee     Problem List Patient Active Problem List   Diagnosis Date Noted  . Long term (current) use of anticoagulants 06/24/2015  . 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 Chest pain 02/23/2013  . Ejection fraction   . Atrial fibrillation ([place])   . Drug intolerance   . Incomplete RBBB   . Carotid bruit   . Knee pain   . Anxiety   . hyperlipidemia   . Palpitations   . BPH (benign prostatic hypertrophy) 02/06/2009  . Personal history of colonic adenomas 11/29/2006    Madelyn Flavors PT 04/16/2017, 2:00 PM  [facility] [address] [place], [place], [address] Phone: [phone]   Fax:  [phone]  Name: Jordan [name] MRN: [id number] Date of Birth: [birth year]

## 2017-04-24 ENCOUNTER — Encounter: Payer: Self-pay | Admitting: Physical Therapy

## 2017-05-07 ENCOUNTER — Ambulatory Visit: Payer: Medicare Other | Admitting: Physical Therapy

## 2017-05-07 DIAGNOSIS — M25562 Pain in left knee: Secondary | ICD-10-CM

## 2017-05-07 DIAGNOSIS — R600 Localized edema: Secondary | ICD-10-CM

## 2017-05-07 NOTE — Therapy (Signed)
[facility] [place], [place], [address] Phone: [phone]   Fax:  [phone]  Physical Therapy Treatment  Patient Details  Name: Jordan [name] MRN: [id number] Date of Birth: 01-07-1946 Referring Provider: Jaynee Eagles   Encounter Date: 05/07/2017  PT End of Session - 05/07/17 1302    Visit Number  8    Number of Visits  16    Date for PT Re-Evaluation  06/04/17    PT Start Time  1300    PT Stop Time  1401    PT Time Calculation (min)  61 min    Activity Tolerance  Patient tolerated treatment well    Behavior During Therapy  [facility] for tasks assessed/performed       Past Medical History:  Diagnosis Date  . Anxiety    Mild  . Atrial fibrillation (HCC)    Paroxysmal  . BPH (benign prostatic hyperplasia)   . CAD (coronary artery disease)    Stent LAD, 2002  /  nuclear 2009, no ischemia  /    [facility] October, 2011 nuclear, no ischemia, possible slight apical scar, ejection fraction 70%  . Carotid bruit    Doppler November, 2011, normal  . Chronic anticoagulation    Low CHADS , score, but patient wants to be aggressive  . Colon polyps   . Drug intolerance    Mild beta blocker intolerance  . Dyslipidemia   . Ejection fraction    EF 65-70%, echo, 2007 /  EF 70%, nuclear, 2011  . Hyperlipidemia   . IBS (irritable bowel syndrome)   . Incomplete RBBB   . Knee pain    November, 201  . Personal history of colonic adenomas 11/29/2006   Qualifier: Diagnosis of  By: [place], [place]    . Syncope    November, 2010    Past Surgical History:  Procedure Laterality Date  . BACK SURGERY  05/1990   L4L5S1  . CARDIAC CATHETERIZATION    . COLONOSCOPY  multiple    There were no vitals filed for this visit.  Subjective Assessment - 05/07/17 1303    Subjective  Patient states the knee feels better and stronger, but there is still something wrong in the knee cap. Pain begins about 20 min into walking. He reports just a  little discomfort in medial aspect of knee.    Pertinent History  afib, CAD, chronic anticoagulation    Patient Stated Goals  able to ambulate without pain    Currently in Pain?  No/denies         [facility] PT Assessment - 05/07/17 0001      Circumferential Edema   Circumferential - Right  44 cm    Circumferential - Left   46 cm      Strength   Overall Strength Comments  flex 5/5    Strength Assessment Site  Knee    Right/Left Knee  Left    Left Knee Flexion  -- 5-/5    Left Knee Extension  -- 5-/5                  [facility] Adult PT Treatment/Exercise - 05/07/17 0001      Knee/Hip Exercises: Aerobic   Stationary Bike  L5 x 11 min      Knee/Hip Exercises: Standing   Lateral Step Up  Both;1 set;Hand Hold: 2;15 reps onto bosu with contralat hip flexion    Forward Step Up  Hand Hold: 1;Both;10 reps;1 set onto [employer]  Functional Squat  10 reps;2 sets to mat table    SLS with Vectors  Bil multiple reps fwd/side/behind    Other Standing Knee Exercises  lunge x 8 ea in bars      Knee/Hip Exercises: Supine   Bridges Limitations  on ball 2  x 10      Modalities   Modalities  Vasopneumatic      Vasopneumatic   Number Minutes Vasopneumatic   15 minutes    Vasopnuematic Location   Knee    Vasopneumatic Pressure  Medium    Vasopneumatic Temperature   34      Manual Therapy   Manual Therapy  Soft tissue mobilization    Soft tissue mobilization  x-friction massage to medial L patella                  PT Long Term Goals - 05/07/17 1305      PT LONG TERM GOAL #1   Title  I with advanced HEP    Time  8    Period  Weeks    Status  On-going      PT LONG TERM GOAL #2   Title  decreased pain in L knee with standing and walking to 2/10 or less    Time  8    Period  Weeks    Status  Achieved      PT LONG TERM GOAL #3   Title  Decreased edema in L knee to within 0.5 cm of R knee    Baseline  2 cm difference as of 05/07/17    Time  8    Period  Weeks     Status  On-going      PT LONG TERM GOAL #4   Title  improved L knee flexion strength to 5-/5 or better    Time  8    Period  Weeks    Status  Achieved            Plan - 05/07/17 1549    Clinical Impression Statement  Patient presents today with reports of improvement. He continues to have mild pain along left medial patella and edema which increases by EOD. He still demonstrates functional weakness in LLE, but is progressing.    Rehab Potential  Excellent    PT Frequency  2x / week    PT Duration  4 weeks    PT Treatment/Interventions  ADLs/Self Care Home Management;Cryotherapy;Electrical Stimulation;Moist Heat;Ultrasound;Neuromuscular re-education;Therapeutic exercise;Patient/family education;Manual techniques;Passive range of motion;Dry needling;Therapeutic activities;Vasopneumatic Device    PT Next Visit Plan  LE strengthening/balance; modalities for pain/edema.     Consulted and Agree with Plan of Care  Patient       Patient will benefit from skilled therapeutic intervention in order to improve the following deficits and impairments:  Pain, Increased edema, Decreased strength, Impaired flexibility, Decreased range of motion  Visit Diagnosis: Acute pain of left knee - Plan: PT plan of care cert/re-cert  Localized edema - Plan: PT plan of care cert/re-cert     Problem List Patient Active Problem List   Diagnosis Date Noted  . Long term (current) use of anticoagulants 06/24/2015  . 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 Chest pain 02/23/2013  . Ejection fraction   . Atrial fibrillation ([place])   . Drug intolerance   . Incomplete RBBB   . Carotid bruit   . Knee pain   . Anxiety   .  hyperlipidemia   . Palpitations   . BPH (benign prostatic hypertrophy) 02/06/2009  . Personal history of colonic adenomas 11/29/2006    Madelyn Flavors PT 05/07/2017, 3:55 PM  [facility] [address] [place], [place], [address] Phone: [phone]   Fax:  [phone]  Name: Jordan [name] MRN: [id number] Date of Birth: 30-Apr-1946

## 2017-05-14 ENCOUNTER — Ambulatory Visit: Payer: Medicare Other | Attending: Sports Medicine | Admitting: Physical Therapy

## 2017-05-14 DIAGNOSIS — R6889 Other general symptoms and signs: Secondary | ICD-10-CM | POA: Insufficient documentation

## 2017-05-14 DIAGNOSIS — M25661 Stiffness of right knee, not elsewhere classified: Secondary | ICD-10-CM | POA: Insufficient documentation

## 2017-05-14 DIAGNOSIS — R600 Localized edema: Secondary | ICD-10-CM | POA: Insufficient documentation

## 2017-05-14 NOTE — Therapy (Signed)
[facility] [place], [place], [address] Phone: [phone]   Fax:  [phone]  Physical Therapy Treatment  Patient Details  Name: Jordan [name] MRN: [id number] Date of Birth: [birth year] Referring Provider: Jaynee Eagles   Encounter Date: 05/14/2017  PT End of Session - 05/14/17 1306    Visit Number  9    Number of Visits  16    Date for PT Re-Evaluation  06/04/17    PT Start Time  3474    PT Stop Time  1404    PT Time Calculation (min)  61 min    Activity Tolerance  Patient tolerated treatment well    Behavior During Therapy  [facility] for tasks assessed/performed       Past Medical History:  Diagnosis Date  . Anxiety    Mild  . Atrial fibrillation (HCC)    Paroxysmal  . BPH (benign prostatic hyperplasia)   . CAD (coronary artery disease)    Stent LAD, 2002  /  nuclear 2009, no ischemia  /    [facility] October, 2011 nuclear, no ischemia, possible slight apical scar, ejection fraction 70%  . Carotid bruit    Doppler November, 2011, normal  . Chronic anticoagulation    Low CHADS , score, but patient wants to be aggressive  . Colon polyps   . Drug intolerance    Mild beta blocker intolerance  . Dyslipidemia   . Ejection fraction    EF 65-70%, echo, 2007 /  EF 70%, nuclear, 2011  . Hyperlipidemia   . IBS (irritable bowel syndrome)   . Incomplete RBBB   . Knee pain    November, 201  . Personal history of colonic adenomas 11/29/2006   Qualifier: Diagnosis of  By: [place], [place]    . Syncope    November, 2010    Past Surgical History:  Procedure Laterality Date  . BACK SURGERY  05/1990   [id number]  . CARDIAC CATHETERIZATION    . COLONOSCOPY  multiple    There were no vitals filed for this visit.  Subjective Assessment - 05/14/17 1307    Subjective  "We're getting there." Patient reports no pain today. He also has no pain when he gets up in the morning which was his main complaint originally.    Pertinent  History  afib, CAD, chronic anticoagulation    Patient Stated Goals  able to ambulate without pain    Currently in Pain?  No/denies                      [facility] Adult PT Treatment/Exercise - 05/14/17 0001      Knee/Hip Exercises: Aerobic   Elliptical  Resistance 3, incline 4 x  15 min      Knee/Hip Exercises: Standing   Lateral Step Up  Both;1 set;Hand Hold: 2;15 reps onto bosu with contralat hip flexion    Forward Step Up  Both;2 sets;10 reps;Hand Hold: 1 bosu    Functional Squat  10 reps;2 sets to mat table    SLS with Vectors  Bil multiple reps fwd/side/behind    Other Standing Knee Exercises  lunge x 8 ea in bars                  PT Long Term Goals - 05/14/17 1427      PT LONG TERM GOAL #1   Title  I with advanced HEP    Time  8    Period  Weeks    Status  Achieved      PT LONG TERM GOAL #2   Title  decreased pain in L knee with standing and walking to 2/10 or less    Time  8    Period  Weeks    Status  Achieved      PT LONG TERM GOAL #4   Title  improved L knee flexion strength to 5-/5 or better    Time  8    Period  Weeks    Status  Achieved      PT LONG TERM GOAL #5   Title  improved R knee ROM 2-120 degrees    Time  8    Period  Weeks    Status  Achieved            Plan - 05/14/17 1306    Clinical Impression Statement  Patient did very well with therapy today with no increased pain with TE.     Rehab Potential  Excellent    PT Frequency  2x / week    PT Duration  4 weeks    PT Treatment/Interventions  ADLs/Self Care Home Management;Cryotherapy;Electrical Stimulation;Moist Heat;Ultrasound;Neuromuscular re-education;Therapeutic exercise;Patient/family education;Manual techniques;Passive range of motion;Dry needling;Therapeutic activities;Vasopneumatic Device    PT Next Visit Plan  LE strengthening/balance; modalities for pain/edema.     Consulted and Agree with Plan of Care  Patient       Patient will benefit from skilled  therapeutic intervention in order to improve the following deficits and impairments:  Pain, Increased edema, Decreased strength, Impaired flexibility, Decreased range of motion  Visit Diagnosis: Stiffness of right knee  Localized edema     Problem List Patient Active Problem List   Diagnosis Date Noted  . Long term (current) use of anticoagulants 06/24/2015  . 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 Chest pain 02/23/2013  . Ejection fraction   . Atrial fibrillation ([place])   . Drug intolerance   . Incomplete RBBB   . Carotid bruit   . Knee pain   . Anxiety   . hyperlipidemia   . Palpitations   . BPH (benign prostatic hypertrophy) 02/06/2009  . Personal history of colonic adenomas 11/29/2006    Madelyn Flavors PT 05/14/2017, 3:14 PM  [facility] [address] [place], [place], [address] Phone: [phone]   Fax:  [phone]  Name: Jordan [name] MRN: [id number] Date of Birth: [birth year]

## 2017-05-15 ENCOUNTER — Encounter: Payer: Self-pay | Admitting: Physical Therapy

## 2017-05-18 ENCOUNTER — Ambulatory Visit: Payer: Medicare Other | Admitting: Physical Therapy

## 2017-05-18 DIAGNOSIS — R600 Localized edema: Secondary | ICD-10-CM | POA: Diagnosis not present

## 2017-05-18 DIAGNOSIS — M25661 Stiffness of right knee, not elsewhere classified: Secondary | ICD-10-CM | POA: Diagnosis not present

## 2017-05-18 DIAGNOSIS — R6889 Other general symptoms and signs: Secondary | ICD-10-CM

## 2017-05-18 NOTE — Therapy (Addendum)
[facility] [place], [place], [address] Phone: [phone]   Fax:  [phone]  Physical Therapy Treatment  Patient Details  Name: Jordan [name] MRN: [id number] Date of Birth: Apr 19, 1946 Referring Provider: Jaynee Eagles   Encounter Date: 05/18/2017  PT End of Session - 05/18/17 1038    Visit Number  10    Number of Visits  16    Date for PT Re-Evaluation  06/04/17    PT Start Time  7673    PT Stop Time  1118    PT Time Calculation (min)  43 min    Activity Tolerance  Patient tolerated treatment well    Behavior During Therapy  [facility] for tasks assessed/performed       Past Medical History:  Diagnosis Date  . Anxiety    Mild  . Atrial fibrillation (HCC)    Paroxysmal  . BPH (benign prostatic hyperplasia)   . CAD (coronary artery disease)    Stent LAD, 2002  /  nuclear 2009, no ischemia  /    [facility] October, 2011 nuclear, no ischemia, possible slight apical scar, ejection fraction 70%  . Carotid bruit    Doppler November, 2011, normal  . Chronic anticoagulation    Low CHADS , score, but patient wants to be aggressive  . Colon polyps   . Drug intolerance    Mild beta blocker intolerance  . Dyslipidemia   . Ejection fraction    EF 65-70%, echo, 2007 /  EF 70%, nuclear, 2011  . Hyperlipidemia   . IBS (irritable bowel syndrome)   . Incomplete RBBB   . Knee pain    November, 201  . Personal history of colonic adenomas 11/29/2006   Qualifier: Diagnosis of  By: [place], [place]    . Syncope    November, 2010    Past Surgical History:  Procedure Laterality Date  . BACK SURGERY  05/1990   [id number]  . CARDIAC CATHETERIZATION    . COLONOSCOPY  multiple    There were no vitals filed for this visit.  Subjective Assessment - 05/18/17 1038    Subjective  Patient has no complaints today.    Pertinent History  afib, CAD, chronic anticoagulation    Diagnostic tests  xrays    Patient Stated Goals  able to  ambulate without pain    Currently in Pain?  No/denies         [facility] PT Assessment - 05/18/17 0001      Circumferential Edema   Circumferential - Right  44 cm    Circumferential - Left   46.5                  [facility] Adult PT Treatment/Exercise - 05/18/17 0001      Knee/Hip Exercises: Aerobic   Elliptical  Resistance 3, incline 4 x  15 min      Knee/Hip Exercises: Standing   Lateral Step Up  Both;1 set;20 reps onto bosu; intermitttent hand hold prn    Forward Step Up  Both;2 sets;10 reps bosu; intermittent hand hold prn    Other Standing Knee Exercises  resisted walking in squat position with orange xts 2x10                  PT Long Term Goals - 05/18/17 1205      PT LONG TERM GOAL #1   Title  I with advanced HEP    Time  8    Period  Weeks    Status  Achieved      PT LONG TERM GOAL #2   Title  decreased pain in L knee with standing and walking to 2/10 or less    Time  8    Period  Weeks    Status  Achieved      PT LONG TERM GOAL #3   Title  Decreased edema in L knee to within 0.5 cm of R knee    Time  8    Period  Weeks    Status  On-going      PT LONG TERM GOAL #4   Title  improved L knee flexion strength to 5-/5 or better    Time  8    Period  Weeks    Status  Achieved      PT LONG TERM GOAL #5   Title  improved R knee ROM 2-120 degrees    Time  8    Period  Weeks    Status  Achieved            Plan - 2017-06-11 1146    Clinical Impression Statement  Patient is doing very well overall with only reports of intermittent "shots" of pain and some discomfort with higher level activities. Balance is improving in clinic. Edema still present but improving overall.    Rehab Potential  Excellent    PT Frequency  2x / week    PT Duration  4 weeks    PT Treatment/Interventions  ADLs/Self Care Home Management;Cryotherapy;Electrical Stimulation;Moist Heat;Ultrasound;Neuromuscular re-education;Therapeutic exercise;Patient/family  education;Manual techniques;Passive range of motion;Dry needling;Therapeutic activities;Vasopneumatic Device    PT Next Visit Plan  LE strengthening/balance; modalities for pain/edema.     PT Home Exercise Plan  prone HS curl with band, SLR with ER, standing hip ADD, stretches for ITB, HS and quads with strap    Consulted and Agree with Plan of Care  Patient       Patient will benefit from skilled therapeutic intervention in order to improve the following deficits and impairments:  Pain, Increased edema, Decreased strength, Impaired flexibility, Decreased range of motion  Visit Diagnosis: Activity intolerance   G-Codes - 11-Jun-2017 1203    Functional Assessment Tool Used (Outpatient Only)  FOTO 38% LIMITED    Functional Limitation  Mobility: Walking and moving around    Mobility: Walking and Moving Around Current Status [phone]  At least 20 percent but less than 40 percent impaired, limited or restricted    Mobility: Walking and Moving Around Goal Status [phone])  At least 40 percent but less than 60 percent impaired, limited or restricted       Problem List Patient Active Problem List   Diagnosis Date Noted  . Long term (current) use of anticoagulants 06/24/2015  . 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 Chest pain 02/23/2013  . Ejection fraction   . Atrial fibrillation ([place])   . Drug intolerance   . Incomplete RBBB   . Carotid bruit   . Knee pain   . Anxiety   . hyperlipidemia   . Palpitations   . BPH (benign prostatic hypertrophy) 02/06/2009  . Personal history of colonic adenomas 11/29/2006    Madelyn Flavors PT 06/11/17, 12:06 PM  [facility] [address] [place], [place], [address] Phone: [phone]   Fax:  [phone]  Name: Jordan [name] MRN: [id number] Date of Birth: August 24, 1945  PHYSICAL THERAPY DISCHARGE SUMMARY  Visits  from Start of Care:  10  Current functional level related to goals / functional outcomes: See Above   Remaining deficits: See above   Education / Equipment: HEP Plan: Patient agrees to discharge.  Patient goals were partially met. Patient is being discharged due to being pleased with the current functional level.  ?????      Madelyn Flavors, PT 04/01/18 7:55 AM [facility] [address] [place], [place], [address] Phone: [phone]   Fax:  [phone]

## 2017-05-22 ENCOUNTER — Encounter: Payer: Self-pay | Admitting: Physical Therapy

## 2017-05-28 ENCOUNTER — Encounter: Payer: Self-pay | Admitting: Family Medicine

## 2017-05-28 ENCOUNTER — Ambulatory Visit: Payer: Medicare Other | Admitting: Family Medicine

## 2017-05-28 ENCOUNTER — Encounter: Payer: Self-pay | Admitting: Physical Therapy

## 2017-05-28 VITALS — BP 99/64 | HR 72 | Temp 96.7°F | Ht 74.0 in | Wt 222.0 lb

## 2017-05-28 DIAGNOSIS — E782 Mixed hyperlipidemia: Secondary | ICD-10-CM

## 2017-05-28 DIAGNOSIS — E559 Vitamin D deficiency, unspecified: Secondary | ICD-10-CM | POA: Diagnosis not present

## 2017-05-28 DIAGNOSIS — R972 Elevated prostate specific antigen [PSA]: Secondary | ICD-10-CM

## 2017-05-28 DIAGNOSIS — I480 Paroxysmal atrial fibrillation: Secondary | ICD-10-CM

## 2017-05-28 DIAGNOSIS — E785 Hyperlipidemia, unspecified: Secondary | ICD-10-CM | POA: Diagnosis not present

## 2017-05-28 MED ORDER — DILTIAZEM HCL ER COATED BEADS 180 MG PO CP24: 180.0000 mg | ORAL_CAPSULE | Freq: Every day | ORAL | 3 refills | Status: DC

## 2017-05-28 NOTE — Progress Notes (Signed)
Subjective:    Patient ID: Jordan [name], male    DOB: Jan 21, 1946, 71 y.o.   MRN: [id number]  HPI Pt here for follow up and management of chronic medical problems which includes dyslipidemia and a fib. He is taking medication regularly.  The patient is doing well other than some left knee problems and he is now getting physical therapy for that.  The cardiologist did stop his fish oil.  Patient is currently taking atorvastatin Eliquis vitamin D Cardizem Pepcid and has nitroglycerin to use as needed.  Has a history of atrial fibrillation BPH.  He sees Dr. Marlou Porch for his cardiology visits.  The patient denies any chest pain pressure or tightness and sees the cardiologist every 6 months.  He is still taking his omega-3 fatty acids and understands that this may not be helping his triglycerides as we once thought it was helping him.  He does have a lot of joint pain.  He has no shortness of breath.  He does well with his GI symptoms unless he eats the wrong kinds of foods and then he has some heartburn.  She takes an as needed Pepcid for this.  He denies any change in bowel habits blood in the stool or black tarry bowel movements.  His last colonoscopy was in the summer 2017 and his not sure when he supposed to get the next one if it is 3 years or 5 years out.  He will follow-up on this with the gastroenterologist.  He denies any trouble with passing his water but does have an elevated PSA and plans to see the urologist again in the spring and have a repeat PSA done at that time.     Patient Active Problem List   Diagnosis Date Noted  . Long term (current) use of anticoagulants 06/24/2015  . 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 Chest pain 02/23/2013  . Ejection fraction   . Atrial fibrillation ([place])   . Drug intolerance   . Incomplete RBBB   . Carotid bruit   . Knee pain   . Anxiety   . hyperlipidemia   . Palpitations     . BPH (benign prostatic hypertrophy) 02/06/2009  . Personal history of colonic adenomas 11/29/2006   Outpatient Encounter Medications as of 05/28/2017  Medication Sig  . apixaban (ELIQUIS) 5 MG TABS tablet Take 1 tablet (5 mg total) by mouth 2 (two) times daily.  Marland Kitchen atorvastatin (LIPITOR) 40 MG tablet Take 1 tablet (40 mg total) by mouth daily at 6 PM.  . Calcium Carb-Cholecalciferol (CALCIUM 600+D3 PO) Take 1 capsule by mouth daily.   . carboxymethylcellulose (REFRESH PLUS) 0.5 % SOLN Place 1 drop into both eyes 3 (three) times daily as needed. For dry eyes  . Cholecalciferol (VITAMIN D3) 1000 UNITS CAPS Take 1,000 Units by mouth daily.   Marland Kitchen diltiazem (CARDIZEM CD) 180 MG 24 hr capsule Take 1 capsule (180 mg total) by mouth daily.  Marland Kitchen diltiazem (CARDIZEM) 60 MG tablet Take 1 tablet (60 mg total) by mouth as needed.  . famotidine (PEPCID) 20 MG tablet Take 1 tablet (20 mg total) by mouth 2 (two) times daily.  . fish oil-omega-3 fatty acids 1000 MG capsule Take 1 g by mouth daily.   . Glucosamine-Chondroitin 1500-1200 MG/30ML LIQD Take 1,500 mg by mouth daily.   . Multiple Vitamins-Minerals (MULTIVITAMIN WITH MINERALS) tablet Take 1 tablet by mouth daily.   . nitroGLYCERIN (NITROSTAT) 0.4  MG SL tablet Place 1 tablet (0.4 mg total) under the tongue every 5 (five) minutes as needed for chest pain.  . Probiotic Product (ALIGN PO) Take 1 tablet by mouth daily.    No facility-administered encounter medications on file as of 05/28/2017.       Review of Systems  Constitutional: Negative.   HENT: Negative.   Eyes: Negative.   Respiratory: Negative.   Cardiovascular: Negative.   Gastrointestinal: Negative.   Endocrine: Negative.   Genitourinary: Negative.   Musculoskeletal: Negative.   Skin: Negative.   Allergic/Immunologic: Negative.   Neurological: Negative.   Hematological: Negative.   Psychiatric/Behavioral: Negative.        Objective:   Physical Exam  Constitutional: He is  oriented to person, place, and time. He appears well-developed and well-nourished. No distress.  Patient is calm pleasant and relaxed  HENT:  Head: Normocephalic and atraumatic.  Right Ear: External ear normal.  Left Ear: External ear normal.  Nose: Nose normal.  Mouth/Throat: Oropharynx is clear and moist. No oropharyngeal exudate.  Eyes: Conjunctivae and EOM are normal. Pupils are equal, round, and reactive to light. Right eye exhibits no discharge. Left eye exhibits no discharge. No scleral icterus.  Neck: Normal range of motion. Neck supple. No thyromegaly present.  No bruits thyromegaly or anterior cervical adenopathy  Cardiovascular: Normal rate, regular rhythm, normal heart sounds and intact distal pulses.  No murmur heard. Heart is regular at 60/min  Pulmonary/Chest: Effort normal and breath sounds normal. No respiratory distress. He has no wheezes. He has no rales. He exhibits no tenderness.  No axillary adenopathy and chest is clear anteriorly and posteriorly  Abdominal: Soft. Bowel sounds are normal. He exhibits no mass. There is no tenderness. There is no rebound and no guarding.  No abdominal tenderness masses organ enlargement bruits or inguinal adenopathy  Genitourinary:  Genitourinary Comments: The patient is seen regularly by his urologist and has another visit scheduled in the spring of this coming year because of an elevated PSA.  Musculoskeletal: Normal range of motion. He exhibits no edema.  Venous insufficiency with discoloration of both lower extremities.  Patient is wearing support hose and this seems to help him a lot.  Lymphadenopathy:    He has no cervical adenopathy.  Neurological: He is alert and oriented to person, place, and time. He has normal reflexes. No cranial nerve deficit.  Skin: Skin is warm and dry. No rash noted.  Psychiatric: He has a normal mood and affect. His behavior is normal. Judgment and thought content normal.  Nursing note and vitals  reviewed.  BP 99/64 (BP Location: Left Arm)   Pulse 72   Temp (!) 96.7 F (35.9 C) (Oral)   Ht 6' 2"  (1.88 m)   Wt 222 lb (100.7 kg)   BMI 28.50 kg/m        Assessment & Plan:  1. Dyslipidemia -10 you with current treatment and aggressive therapeutic lifestyle changes pending results of lab work - CBC with Differential/Platelet - NMR, lipoprofile - Hepatic function panel  2. Mixed hyperlipidemia -Continue with current treatment and we will look into starting vascepa at a future visit.   - BMP8+EGFR - CBC with Differential/Platelet - NMR, lipoprofile - Hepatic function panel  3. Vitamin D deficiency -Continue current treatment pending results of lab work - CBC with Differential/Platelet - VITAMIN D 25 Hydroxy (Vit-D Deficiency, Fractures)  4. Paroxysmal atrial fibrillation (HCC) -Shot was in normal sinus rhythm today at 60/min - BMP8+EGFR - CBC with  Differential/Platelet  5. Elevated PSA -Follow-up with Dr. Jeffie Pollock in the spring and he may get PSA here prior to that visit.  Meds ordered this encounter  Medications  . diltiazem (CARDIZEM CD) 180 MG 24 hr capsule    Sig: Take 1 capsule (180 mg total) by mouth daily.    Dispense:  90 capsule    Refill:  3   Patient Instructions                       Medicare Annual Wellness Visit  [facility] and the medical providers at [facility] strive to bring you the best medical care.  In doing so we not only want to address your current medical conditions and concerns but also to detect new conditions early and prevent illness, disease and health-related problems.    Medicare offers a yearly Wellness Visit which allows our clinical staff to assess your need for preventative services including immunizations, lifestyle education, counseling to decrease risk of preventable diseases and screening for fall risk and other medical concerns.    This visit is provided free of charge (no copay) for all Medicare  recipients. The clinical pharmacists at [facility] have begun to conduct these Wellness Visits which will also include a thorough review of all your medications.    As you primary medical provider recommend that you make an appointment for your Annual Wellness Visit if you have not done so already this year.  You may set up this appointment before you leave today or you may call back ([phone]) and schedule an appointment.  Please make sure when you call that you mention that you are scheduling your Annual Wellness Visit with the clinical pharmacist so that the appointment may be made for the proper length of time.     Continue current medications. Continue good therapeutic lifestyle changes which include good diet and exercise. Fall precautions discussed with patient. If an FOBT was given today- please return it to our front desk. If you are over 109 years old - you may need Prevnar 41 or the adult Pneumonia vaccine.  **Flu shots are available--- please call and schedule a FLU-CLINIC appointment**  After your visit with [place] today you will receive a survey in the mail or online from [employer] regarding your care with [place]. Please take a moment to fill this out. Your feedback is very important to [place] as you can help [place] better understand your patient needs as well as improve your experience and satisfaction. WE CARE ABOUT YOU!!!   Continue to follow-up with urology cardiology and get colonoscopy either in 2020 or 2022. Check with cardiologist regarding vascepa, a new omega-3 fatty acid which has outcome trials that look excellent but may be too expensive for Medicare patients at this time. Continue to eat healthy, eat organic and avoid sugar Continue to take Pepcid or take generic Zantac, the equate brand 1 prior to eating irritating foods.  Arrie Senate MD

## 2017-05-28 NOTE — Patient Instructions (Addendum)
Medicare Annual Wellness Visit  [facility] and the medical providers at [facility] strive to bring you the best medical care.  In doing so we not only want to address your current medical conditions and concerns but also to detect new conditions early and prevent illness, disease and health-related problems.    Medicare offers a yearly Wellness Visit which allows our clinical staff to assess your need for preventative services including immunizations, lifestyle education, counseling to decrease risk of preventable diseases and screening for fall risk and other medical concerns.    This visit is provided free of charge (no copay) for all Medicare recipients. The clinical pharmacists at [facility] have begun to conduct these Wellness Visits which will also include a thorough review of all your medications.    As you primary medical provider recommend that you make an appointment for your Annual Wellness Visit if you have not done so already this year.  You may set up this appointment before you leave today or you may call back ([phone]) and schedule an appointment.  Please make sure when you call that you mention that you are scheduling your Annual Wellness Visit with the clinical pharmacist so that the appointment may be made for the proper length of time.     Continue current medications. Continue good therapeutic lifestyle changes which include good diet and exercise. Fall precautions discussed with patient. If an FOBT was given today- please return it to our front desk. If you are over 66 years old - you may need Prevnar 21 or the adult Pneumonia vaccine.  **Flu shots are available--- please call and schedule a FLU-CLINIC appointment**  After your visit with [place] today you will receive a survey in the mail or online from [employer] regarding your care with [place]. Please take a moment to fill this out. Your feedback is very  important to [place] as you can help [place] better understand your patient needs as well as improve your experience and satisfaction. WE CARE ABOUT YOU!!!   Continue to follow-up with urology cardiology and get colonoscopy either in 2020 or 2022. Check with cardiologist regarding vascepa, a new omega-3 fatty acid which has outcome trials that look excellent but may be too expensive for Medicare patients at this time. Continue to eat healthy, eat organic and avoid sugar Continue to take Pepcid or take generic Zantac, the equate brand 1 prior to eating irritating foods.

## 2017-05-29 LAB — BMP8+EGFR
BUN/Creatinine Ratio: 19 (ref 10–24)
BUN: 19 mg/dL (ref 8–27)
CO2: 28 mmol/L (ref 20–29)
CREATININE: 0.98 mg/dL (ref 0.76–1.27)
Calcium: 8.8 mg/dL (ref 8.6–10.2)
Chloride: 103 mmol/L (ref 96–106)
GFR calc Af Amer: 90 mL/min/{1.73_m2} (ref >59)
GFR, EST NON AFRICAN AMERICAN: 78 mL/min/{1.73_m2} (ref >59)
GLUCOSE: 86 mg/dL (ref 65–99)
Potassium: 4.2 mmol/L (ref 3.5–5.2)
Sodium: 141 mmol/L (ref 134–144)

## 2017-05-29 LAB — HEPATIC FUNCTION PANEL
ALT: 20 IU/L (ref 0–44)
AST: 22 IU/L (ref 0–40)
Albumin: 3.8 g/dL (ref 3.5–4.8)
Alkaline Phosphatase: 59 IU/L (ref 39–117)
Bilirubin Total: 0.9 mg/dL (ref 0.0–1.2)
Bilirubin, Direct: 0.22 mg/dL (ref 0.00–0.40)
Total Protein: 6.1 g/dL (ref 6.0–8.5)

## 2017-05-29 LAB — CBC WITH DIFFERENTIAL/PLATELET
BASOS: 1 %
Basophils Absolute: 0 10*3/uL (ref 0.0–0.2)
EOS (ABSOLUTE): 0.2 10*3/uL (ref 0.0–0.4)
EOS: 3 %
HEMATOCRIT: 44.2 % (ref 37.5–51.0)
HEMOGLOBIN: 14.9 g/dL (ref 13.0–17.7)
IMMATURE GRANS (ABS): 0 10*3/uL (ref 0.0–0.1)
IMMATURE GRANULOCYTES: 0 %
LYMPHS: 20 %
Lymphocytes Absolute: 1.6 10*3/uL (ref 0.7–3.1)
MCH: 30.7 pg (ref 26.6–33.0)
MCHC: 33.7 g/dL (ref 31.5–35.7)
MCV: 91 fL (ref 79–97)
MONOCYTES: 7 %
MONOS ABS: 0.6 10*3/uL (ref 0.1–0.9)
NEUTROS PCT: 69 %
Neutrophils Absolute: 5.6 10*3/uL (ref 1.4–7.0)
Platelets: 220 10*3/uL (ref 150–379)
RBC: 4.85 x10E6/uL (ref 4.14–5.80)
RDW: 13.8 % (ref 12.3–15.4)
WBC: 8 10*3/uL (ref 3.4–10.8)

## 2017-05-29 LAB — NMR, LIPOPROFILE
CHOLESTEROL: 117 mg/dL (ref 100–199)
HDL Cholesterol by NMR: 35 mg/dL — ABNORMAL LOW (ref >39)
HDL PARTICLE NUMBER: 29 umol/L — AB (ref >=30.5)
LDL Particle Number: 866 nmol/L (ref <1000)
LDL SIZE: 20.8 nm (ref >20.5)
LDL-C: 63 mg/dL (ref 0–99)
LP-IR SCORE: 47 — AB (ref <=45)
SMALL LDL PARTICLE NUMBER: 409 nmol/L (ref <=527)
Triglycerides by NMR: 95 mg/dL (ref 0–149)

## 2017-05-29 LAB — VITAMIN D 25 HYDROXY (VIT D DEFICIENCY, FRACTURES): VIT D 25 HYDROXY: 54.9 ng/mL (ref 30.0–100.0)

## 2017-07-16 DIAGNOSIS — M546 Pain in thoracic spine: Secondary | ICD-10-CM | POA: Diagnosis not present

## 2017-07-16 DIAGNOSIS — M47812 Spondylosis without myelopathy or radiculopathy, cervical region: Secondary | ICD-10-CM | POA: Diagnosis not present

## 2017-07-16 DIAGNOSIS — M9901 Segmental and somatic dysfunction of cervical region: Secondary | ICD-10-CM | POA: Diagnosis not present

## 2017-07-16 DIAGNOSIS — M9902 Segmental and somatic dysfunction of thoracic region: Secondary | ICD-10-CM | POA: Diagnosis not present

## 2017-07-16 DIAGNOSIS — M9903 Segmental and somatic dysfunction of lumbar region: Secondary | ICD-10-CM | POA: Diagnosis not present

## 2017-08-13 DIAGNOSIS — L570 Actinic keratosis: Secondary | ICD-10-CM | POA: Diagnosis not present

## 2017-08-13 DIAGNOSIS — D2262 Melanocytic nevi of left upper limb, including shoulder: Secondary | ICD-10-CM | POA: Diagnosis not present

## 2017-08-13 DIAGNOSIS — L821 Other seborrheic keratosis: Secondary | ICD-10-CM | POA: Diagnosis not present

## 2017-08-13 DIAGNOSIS — Z85828 Personal history of other malignant neoplasm of skin: Secondary | ICD-10-CM | POA: Diagnosis not present

## 2017-08-13 DIAGNOSIS — L578 Other skin changes due to chronic exposure to nonionizing radiation: Secondary | ICD-10-CM | POA: Diagnosis not present

## 2017-08-30 DIAGNOSIS — R972 Elevated prostate specific antigen [PSA]: Secondary | ICD-10-CM | POA: Diagnosis not present

## 2017-09-05 ENCOUNTER — Ambulatory Visit: Payer: Medicare Other | Admitting: Family Medicine

## 2017-09-05 ENCOUNTER — Encounter: Payer: Self-pay | Admitting: Family Medicine

## 2017-09-05 VITALS — BP 134/84 | HR 70 | Temp 97.0°F | Ht 74.0 in | Wt 222.0 lb

## 2017-09-05 DIAGNOSIS — R972 Elevated prostate specific antigen [PSA]: Secondary | ICD-10-CM | POA: Diagnosis not present

## 2017-09-05 DIAGNOSIS — N401 Enlarged prostate with lower urinary tract symptoms: Secondary | ICD-10-CM | POA: Diagnosis not present

## 2017-09-05 DIAGNOSIS — J0100 Acute maxillary sinusitis, unspecified: Secondary | ICD-10-CM

## 2017-09-05 MED ORDER — AMOXICILLIN-POT CLAVULANATE 875-125 MG PO TABS: 1.0000 | ORAL_TABLET | Freq: Two times a day (BID) | ORAL | 0 refills | Status: DC

## 2017-09-05 MED ORDER — PSEUDOEPHEDRINE-GUAIFENESIN ER 60-600 MG PO TB12: 1.0000 | ORAL_TABLET | Freq: Two times a day (BID) | ORAL | 0 refills | Status: AC

## 2017-09-05 NOTE — Progress Notes (Signed)
Chief Complaint  Patient presents with  . Sinusitis    HPI  Patient presents today for Patient presents with upper respiratory congestion. Rhinorrhea that is frequently purulent. There is moderate sore throat. Patient reports minimal AM cough only. There is no fever, chills or sweats. The patient denies being short of breath. Onset was 6 days ago.  He reports that the left cheek is painful and the teeth hurt.  His forehead has a sensation of pressure.  PMH: Smoking status noted ROS: Per HPI  Objective: BP 134/84   Pulse 70   Temp (!) 97 F (36.1 C) (Oral)   Ht 6\' 2"  (1.88 m)   Wt 222 lb (100.7 kg)   BMI 28.50 kg/m  Gen: NAD, alert, cooperative with exam HEENT: NCAT, Nasal passages swollen, red left TM is red maxillary sinus tender on the left frontal sinus tender bilaterally. CV: RRR, good S1/S2, no murmur Ext: No edema, warm Neuro: Alert and oriented, No gross deficits  Assessment and plan:  1. Acute maxillary sinusitis, recurrence not specified     Meds ordered this encounter  Medications  . amoxicillin-clavulanate (AUGMENTIN) 875-125 MG tablet    Sig: Take 1 tablet by mouth 2 (two) times daily. Take all of this medication    Dispense:  20 tablet    Refill:  0  . pseudoephedrine-guaifenesin (MUCINEX D) 60-600 MG 12 hr tablet    Sig: Take 1 tablet by mouth every 12 (twelve) hours for 14 days. As needed for congestion    Dispense:  20 tablet    Refill:  0    No orders of the defined types were placed in this encounter.   Follow up as needed.  Claretta Fraise, MD

## 2017-11-13 DIAGNOSIS — M9902 Segmental and somatic dysfunction of thoracic region: Secondary | ICD-10-CM | POA: Diagnosis not present

## 2017-11-13 DIAGNOSIS — M47812 Spondylosis without myelopathy or radiculopathy, cervical region: Secondary | ICD-10-CM | POA: Diagnosis not present

## 2017-11-13 DIAGNOSIS — M9901 Segmental and somatic dysfunction of cervical region: Secondary | ICD-10-CM | POA: Diagnosis not present

## 2017-11-13 DIAGNOSIS — M546 Pain in thoracic spine: Secondary | ICD-10-CM | POA: Diagnosis not present

## 2017-11-13 DIAGNOSIS — M9903 Segmental and somatic dysfunction of lumbar region: Secondary | ICD-10-CM | POA: Diagnosis not present

## 2017-11-27 ENCOUNTER — Ambulatory Visit: Payer: Medicare Other | Admitting: Family Medicine

## 2017-12-10 ENCOUNTER — Ambulatory Visit (INDEPENDENT_AMBULATORY_CARE_PROVIDER_SITE_OTHER): Payer: Medicare Other

## 2017-12-10 ENCOUNTER — Encounter: Payer: Self-pay | Admitting: Family Medicine

## 2017-12-10 ENCOUNTER — Ambulatory Visit: Payer: Medicare Other | Admitting: Family Medicine

## 2017-12-10 VITALS — BP 101/65 | HR 61 | Temp 96.7°F | Ht 74.0 in | Wt 221.0 lb

## 2017-12-10 DIAGNOSIS — Z0000 Encounter for general adult medical examination without abnormal findings: Secondary | ICD-10-CM

## 2017-12-10 DIAGNOSIS — I480 Paroxysmal atrial fibrillation: Secondary | ICD-10-CM | POA: Diagnosis not present

## 2017-12-10 DIAGNOSIS — J301 Allergic rhinitis due to pollen: Secondary | ICD-10-CM

## 2017-12-10 DIAGNOSIS — Z6828 Body mass index (BMI) 28.0-28.9, adult: Secondary | ICD-10-CM | POA: Diagnosis not present

## 2017-12-10 DIAGNOSIS — R972 Elevated prostate specific antigen [PSA]: Secondary | ICD-10-CM

## 2017-12-10 DIAGNOSIS — E559 Vitamin D deficiency, unspecified: Secondary | ICD-10-CM | POA: Diagnosis not present

## 2017-12-10 DIAGNOSIS — E782 Mixed hyperlipidemia: Secondary | ICD-10-CM

## 2017-12-10 DIAGNOSIS — R12 Heartburn: Secondary | ICD-10-CM

## 2017-12-10 LAB — MICROSCOPIC EXAMINATION
Bacteria, UA: NONE SEEN
RENAL EPITHEL UA: NONE SEEN /HPF
WBC, UA: NONE SEEN /hpf (ref 0–5)

## 2017-12-10 LAB — URINALYSIS, COMPLETE
BILIRUBIN UA: NEGATIVE
Glucose, UA: NEGATIVE
LEUKOCYTES UA: NEGATIVE
Nitrite, UA: NEGATIVE
PH UA: 6 (ref 5.0–7.5)
Protein, UA: NEGATIVE
Specific Gravity, UA: 1.02 (ref 1.005–1.030)
UUROB: 0.2 mg/dL (ref 0.2–1.0)

## 2017-12-10 MED ORDER — DILTIAZEM HCL ER COATED BEADS 180 MG PO CP24: 180.0000 mg | ORAL_CAPSULE | Freq: Every day | ORAL | 3 refills | Status: DC

## 2017-12-10 MED ORDER — ATORVASTATIN CALCIUM 40 MG PO TABS: 40.0000 mg | ORAL_TABLET | Freq: Every day | ORAL | 3 refills | Status: DC

## 2017-12-10 MED ORDER — APIXABAN 5 MG PO TABS
5.0000 mg | ORAL_TABLET | Freq: Two times a day (BID) | ORAL | 3 refills | Status: DC
Start: 2017-12-10 — End: 2018-11-18

## 2017-12-10 NOTE — Progress Notes (Signed)
Subjective:    Patient ID: Jordan [name], male    DOB: 11-19-1945, 72 y.o.   MRN: [id number]  HPI Patient is here today for annual wellness exam and follow up of chronic medical problems which includes hyperlipidemia. He is taking medication regularly.  The patient is doing well overall.  He does complain of leg cramps today and reflux today.  He is due for a general physical exam today and needs refills on all of his medicines.  His weight today is 221.  His vital signs are stable.  He sees the cardiologist regularly for his atrial fibrillation.  His last colonoscopy was 2 years ago.  This was by Dr. Carlean Purl and he did have one polyp.  The patient's family history is positive for heart disease in his mother.  The patient today also sees a cardiologist, Dr. Marlou Porch and he saw him earlier this year and will see him on a yearly basis.  He denies any chest pain pressure tightness or shortness of breath.  He denies any trouble with nausea vomiting diarrhea blood in the stool or black tarry bowel movements but does have occasional heartburn.  He will try taking some ranitidine on a more regular basis for a month and then back off to once a day and then see how the heartburn does after that.  He has not had any trouble with swallowing.  He is passing his water without problems.  He still sees the urologist every now and then.   Patient Active Problem List   Diagnosis Date Noted  . Long term (current) use of anticoagulants 06/24/2015  . 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 Chest pain 02/23/2013  . Ejection fraction   . Atrial fibrillation ([place])   . Drug intolerance   . Incomplete RBBB   . Carotid bruit   . Knee pain   . Anxiety   . hyperlipidemia   . Palpitations   . BPH (benign prostatic hypertrophy) 02/06/2009  . Personal history of colonic adenomas 11/29/2006   Outpatient Encounter Medications as of 12/10/2017    Medication Sig  . amoxicillin-clavulanate (AUGMENTIN) 875-125 MG tablet Take 1 tablet by mouth 2 (two) times daily. Take all of this medication  . apixaban (ELIQUIS) 5 MG TABS tablet Take 1 tablet (5 mg total) by mouth 2 (two) times daily.  Marland Kitchen atorvastatin (LIPITOR) 40 MG tablet Take 1 tablet (40 mg total) by mouth daily at 6 PM.  . Calcium Carb-Cholecalciferol (CALCIUM 600+D3 PO) Take 1 capsule by mouth daily.   . carboxymethylcellulose (REFRESH PLUS) 0.5 % SOLN Place 1 drop into both eyes 3 (three) times daily as needed. For dry eyes  . Cholecalciferol (VITAMIN D3) 1000 UNITS CAPS Take 1,000 Units by mouth daily.   Marland Kitchen diltiazem (CARDIZEM CD) 180 MG 24 hr capsule Take 1 capsule (180 mg total) by mouth daily.  Marland Kitchen diltiazem (CARDIZEM) 60 MG tablet Take 1 tablet (60 mg total) by mouth as needed.  . famotidine (PEPCID) 20 MG tablet Take 1 tablet (20 mg total) by mouth 2 (two) times daily.  . fish oil-omega-3 fatty acids 1000 MG capsule Take 1 g by mouth daily.   . Glucosamine-Chondroitin 1500-1200 MG/30ML LIQD Take 1,500 mg by mouth daily.   . Multiple Vitamins-Minerals (MULTIVITAMIN WITH MINERALS) tablet Take 1 tablet by mouth daily.   . nitroGLYCERIN (NITROSTAT) 0.4 MG SL tablet Place 1 tablet (0.4 mg total) under the tongue every 5 (five)  minutes as needed for chest pain.  . Probiotic Product (ALIGN PO) Take 1 tablet by mouth daily.    No facility-administered encounter medications on file as of 12/10/2017.       Review of Systems  Constitutional: Negative.   HENT: Negative.   Eyes: Negative.   Respiratory: Negative.   Cardiovascular: Negative.   Gastrointestinal: Negative.        Gerd  Endocrine: Negative.   Genitourinary: Negative.   Musculoskeletal: Negative.        Leg cramps at night  Skin: Negative.   Allergic/Immunologic: Negative.   Neurological: Negative.   Hematological: Negative.   Psychiatric/Behavioral: Negative.        Objective:   Physical Exam  Constitutional:  He is oriented to person, place, and time. He appears well-developed and well-nourished. No distress.  The patient is calm pleasant and relaxed  HENT:  Head: Normocephalic and atraumatic.  Right Ear: External ear normal.  Left Ear: External ear normal.  Mouth/Throat: Oropharynx is clear and moist. No oropharyngeal exudate.  Nasal congestion bilaterally  Eyes: Pupils are equal, round, and reactive to light. Conjunctivae and EOM are normal. Right eye exhibits no discharge. Left eye exhibits no discharge. No scleral icterus.  Patient gets eyes checked regularly  Neck: Normal range of motion. Neck supple. No thyromegaly present.  No bruits thyromegaly or anterior cervical adenopathy  Cardiovascular: Normal rate, regular rhythm, normal heart sounds and intact distal pulses.  No murmur heard. Heart is regular at 72/min  Pulmonary/Chest: Effort normal and breath sounds normal. He has no wheezes. He has no rales. He exhibits no tenderness.  Clear anteriorly and posteriorly no axillary adenopathy or chest wall tenderness or masses  Abdominal: Soft. Bowel sounds are normal. He exhibits no mass. There is no tenderness.  No liver or spleen enlargement epigastric tenderness masses bruits or inguinal adenopathy  Genitourinary:  Genitourinary Comments: This is deferred to the urologist, Dr. Irine Seal  Musculoskeletal: Normal range of motion. He exhibits no edema.  Lymphadenopathy:    He has no cervical adenopathy.  Neurological: He is alert and oriented to person, place, and time. He has normal reflexes. No cranial nerve deficit.  Skin: Skin is warm and dry. No rash noted.  Psychiatric: He has a normal mood and affect. His behavior is normal. Judgment and thought content normal.  The patient has normal mood affect behavior and judgment.  Nursing note and vitals reviewed.  BP 101/65 (BP Location: Left Arm)   Pulse 61   Temp (!) 96.7 F (35.9 C) (Oral)   Ht 6' 2"  (1.88 m)   Wt 221 lb (100.2 kg)    BMI 28.37 kg/m         Assessment & Plan:  1. Annual physical exam -Continue with regular follow-ups with urology and cardiology - CBC with Differential/Platelet - BMP8+EGFR - Lipid panel - VITAMIN D 25 Hydroxy (Vit-D Deficiency, Fractures) - PSA, total and free - Hepatic function panel - Urinalysis, Complete  2. Mixed hyperlipidemia -Continue with aggressive therapeutic lifestyle changes and current treatment pending results of lab work - [employer]; Future - CBC with Differential/Platelet - Lipid panel - Hepatic function panel  3. Vitamin D deficiency -Continue with vitamin D replacement pending results of lab work - CBC with Differential/Platelet - VITAMIN D 25 Hydroxy (Vit-D Deficiency, Fractures)  4. Paroxysmal atrial fibrillation [facility]) -Follow-up with cardiology as planned - DG Chest 2 View; Future - apixaban (ELIQUIS) 5 MG TABS tablet; Take 1 tablet (5  mg total) by mouth 2 (two) times daily.  Dispense: 180 tablet; Refill: 3 - CBC with Differential/Platelet  5. Elevated PSA -Follow-up with urology as planned - CBC with Differential/Platelet - PSA, total and free - Urinalysis, Complete  6. Heartburn -Patient will take ranitidine 150 mg twice daily on a regular basis for 1 month before breakfast and dinner and if he is better he will reduce this to once a day for another month and then as needed.  7. BMI 28.0-28.9,adult -Continue with aggressive therapeutic lifestyle changes  8. Non-seasonal allergic rhinitis due to pollen -Continue with nasal sprays of steroid and antihistamine.  9. Healthcare maintenance - Vitamin B12  Meds ordered this encounter  Medications  . apixaban (ELIQUIS) 5 MG TABS tablet    Sig: Take 1 tablet (5 mg total) by mouth 2 (two) times daily.    Dispense:  180 tablet    Refill:  3  . diltiazem (CARDIZEM CD) 180 MG 24 hr capsule    Sig: Take 1 capsule (180 mg total) by mouth daily.    Dispense:  90 capsule    Refill:  3  .  atorvastatin (LIPITOR) 40 MG tablet    Sig: Take 1 tablet (40 mg total) by mouth daily at 6 PM.    Dispense:  90 tablet    Refill:  3   Patient Instructions                       Medicare Annual Wellness Visit  [facility] and the medical providers at [facility] strive to bring you the best medical care.  In doing so we not only want to address your current medical conditions and concerns but also to detect new conditions early and prevent illness, disease and health-related problems.    Medicare offers a yearly Wellness Visit which allows our clinical staff to assess your need for preventative services including immunizations, lifestyle education, counseling to decrease risk of preventable diseases and screening for fall risk and other medical concerns.    This visit is provided free of charge (no copay) for all Medicare recipients. The clinical pharmacists at [facility] have begun to conduct these Wellness Visits which will also include a thorough review of all your medications.    As you primary medical provider recommend that you make an appointment for your Annual Wellness Visit if you have not done so already this year.  You may set up this appointment before you leave today or you may call back ([phone]) and schedule an appointment.  Please make sure when you call that you mention that you are scheduling your Annual Wellness Visit with the clinical pharmacist so that the appointment may be made for the proper length of time.     Continue current medications. Continue good therapeutic lifestyle changes which include good diet and exercise. Fall precautions discussed with patient. If an FOBT was given today- please return it to our front desk. If you are over 53 years old - you may need Prevnar 84 or the adult Pneumonia vaccine.  **Flu shots are available--- please call and schedule a FLU-CLINIC appointment**  After your visit with [place]  today you will receive a survey in the mail or online from [employer] regarding your care with [place]. Please take a moment to fill this out. Your feedback is very important to [place] as you can help [place] better understand your patient needs as well as improve your  experience and satisfaction. WE CARE ABOUT YOU!!!   Continue to avoid high allergen situations as much as possible Continue with current nose sprays Drink more water! Continue to follow-up with urology and cardiology We will send a copy of your blood work to both of the specialist Reduce the use of fans as much as possible   Arrie Senate MD

## 2017-12-10 NOTE — Patient Instructions (Addendum)
Medicare Annual Wellness Visit  [facility] and the medical providers at [facility] strive to bring you the best medical care.  In doing so we not only want to address your current medical conditions and concerns but also to detect new conditions early and prevent illness, disease and health-related problems.    Medicare offers a yearly Wellness Visit which allows our clinical staff to assess your need for preventative services including immunizations, lifestyle education, counseling to decrease risk of preventable diseases and screening for fall risk and other medical concerns.    This visit is provided free of charge (no copay) for all Medicare recipients. The clinical pharmacists at [facility] have begun to conduct these Wellness Visits which will also include a thorough review of all your medications.    As you primary medical provider recommend that you make an appointment for your Annual Wellness Visit if you have not done so already this year.  You may set up this appointment before you leave today or you may call back ([phone]) and schedule an appointment.  Please make sure when you call that you mention that you are scheduling your Annual Wellness Visit with the clinical pharmacist so that the appointment may be made for the proper length of time.     Continue current medications. Continue good therapeutic lifestyle changes which include good diet and exercise. Fall precautions discussed with patient. If an FOBT was given today- please return it to our front desk. If you are over 20 years old - you may need Prevnar 47 or the adult Pneumonia vaccine.  **Flu shots are available--- please call and schedule a FLU-CLINIC appointment**  After your visit with [place] today you will receive a survey in the mail or online from [employer] regarding your care with [place]. Please take a moment to fill this out. Your feedback is very  important to [place] as you can help [place] better understand your patient needs as well as improve your experience and satisfaction. WE CARE ABOUT YOU!!!   Continue to avoid high allergen situations as much as possible Continue with current nose sprays Drink more water! Continue to follow-up with urology and cardiology We will send a copy of your blood work to both of the specialist Reduce the use of fans as much as possible

## 2017-12-11 LAB — CBC WITH DIFFERENTIAL/PLATELET
BASOS ABS: 0 10*3/uL (ref 0.0–0.2)
Basos: 1 %
EOS (ABSOLUTE): 0.3 10*3/uL (ref 0.0–0.4)
Eos: 3 %
HEMOGLOBIN: 15 g/dL (ref 13.0–17.7)
Hematocrit: 46 % (ref 37.5–51.0)
Immature Grans (Abs): 0 10*3/uL (ref 0.0–0.1)
Immature Granulocytes: 0 %
LYMPHS ABS: 2.1 10*3/uL (ref 0.7–3.1)
Lymphs: 24 %
MCH: 29.8 pg (ref 26.6–33.0)
MCHC: 32.6 g/dL (ref 31.5–35.7)
MCV: 91 fL (ref 79–97)
MONOCYTES: 7 %
MONOS ABS: 0.6 10*3/uL (ref 0.1–0.9)
NEUTROS ABS: 5.5 10*3/uL (ref 1.4–7.0)
Neutrophils: 65 %
Platelets: 248 10*3/uL (ref 150–450)
RBC: 5.04 x10E6/uL (ref 4.14–5.80)
RDW: 13.6 % (ref 12.3–15.4)
WBC: 8.5 10*3/uL (ref 3.4–10.8)

## 2017-12-11 LAB — VITAMIN D 25 HYDROXY (VIT D DEFICIENCY, FRACTURES): VIT D 25 HYDROXY: 50.6 ng/mL (ref 30.0–100.0)

## 2017-12-11 LAB — BMP8+EGFR
BUN / CREAT RATIO: 18 (ref 10–24)
BUN: 18 mg/dL (ref 8–27)
CO2: 22 mmol/L (ref 20–29)
Calcium: 9.1 mg/dL (ref 8.6–10.2)
Chloride: 100 mmol/L (ref 96–106)
Creatinine, Ser: 1.02 mg/dL (ref 0.76–1.27)
GFR, EST AFRICAN AMERICAN: 86 mL/min/{1.73_m2} (ref >59)
GFR, EST NON AFRICAN AMERICAN: 74 mL/min/{1.73_m2} (ref >59)
Glucose: 77 mg/dL (ref 65–99)
POTASSIUM: 4.5 mmol/L (ref 3.5–5.2)
SODIUM: 140 mmol/L (ref 134–144)

## 2017-12-11 LAB — HEPATIC FUNCTION PANEL
ALK PHOS: 67 IU/L (ref 39–117)
ALT: 25 IU/L (ref 0–44)
AST: 28 IU/L (ref 0–40)
Albumin: 4.1 g/dL (ref 3.5–4.8)
BILIRUBIN TOTAL: 0.9 mg/dL (ref 0.0–1.2)
BILIRUBIN, DIRECT: 0.25 mg/dL (ref 0.00–0.40)
TOTAL PROTEIN: 6.7 g/dL (ref 6.0–8.5)

## 2017-12-11 LAB — LIPID PANEL
CHOL/HDL RATIO: 3.6 ratio (ref 0.0–5.0)
Cholesterol, Total: 121 mg/dL (ref 100–199)
HDL: 34 mg/dL — ABNORMAL LOW (ref >39)
LDL CALC: 67 mg/dL (ref 0–99)
Triglycerides: 100 mg/dL (ref 0–149)
VLDL Cholesterol Cal: 20 mg/dL (ref 5–40)

## 2017-12-11 LAB — PSA, TOTAL AND FREE
PSA FREE PCT: 27.5 %
PSA, Free: 2.86 ng/mL
Prostate Specific Ag, Serum: 10.4 ng/mL — ABNORMAL HIGH (ref 0.0–4.0)

## 2017-12-11 LAB — VITAMIN B12: VITAMIN B 12: 1258 pg/mL — AB (ref 232–1245)

## 2018-02-09 DIAGNOSIS — G44219 Episodic tension-type headache, not intractable: Secondary | ICD-10-CM | POA: Diagnosis not present

## 2018-02-09 DIAGNOSIS — H40033 Anatomical narrow angle, bilateral: Secondary | ICD-10-CM | POA: Diagnosis not present

## 2018-02-14 DIAGNOSIS — M9901 Segmental and somatic dysfunction of cervical region: Secondary | ICD-10-CM | POA: Diagnosis not present

## 2018-02-14 DIAGNOSIS — M47812 Spondylosis without myelopathy or radiculopathy, cervical region: Secondary | ICD-10-CM | POA: Diagnosis not present

## 2018-02-14 DIAGNOSIS — M9903 Segmental and somatic dysfunction of lumbar region: Secondary | ICD-10-CM | POA: Diagnosis not present

## 2018-02-14 DIAGNOSIS — M9902 Segmental and somatic dysfunction of thoracic region: Secondary | ICD-10-CM | POA: Diagnosis not present

## 2018-02-14 DIAGNOSIS — M546 Pain in thoracic spine: Secondary | ICD-10-CM | POA: Diagnosis not present

## 2018-03-05 ENCOUNTER — Encounter: Payer: Self-pay | Admitting: Cardiology

## 2018-03-18 ENCOUNTER — Ambulatory Visit: Payer: Self-pay | Admitting: Cardiology

## 2018-03-19 ENCOUNTER — Ambulatory Visit: Payer: Self-pay | Admitting: Cardiology

## 2018-03-27 ENCOUNTER — Ambulatory Visit: Payer: Medicare Other | Admitting: Cardiology

## 2018-03-27 ENCOUNTER — Encounter: Payer: Self-pay | Admitting: Cardiology

## 2018-03-27 VITALS — BP 110/70 | HR 61 | Ht 74.0 in | Wt 224.4 lb

## 2018-03-27 DIAGNOSIS — I480 Paroxysmal atrial fibrillation: Secondary | ICD-10-CM

## 2018-03-27 DIAGNOSIS — Z7901 Long term (current) use of anticoagulants: Secondary | ICD-10-CM

## 2018-03-27 DIAGNOSIS — I2510 Atherosclerotic heart disease of native coronary artery without angina pectoris: Secondary | ICD-10-CM | POA: Diagnosis not present

## 2018-03-27 DIAGNOSIS — E785 Hyperlipidemia, unspecified: Secondary | ICD-10-CM | POA: Diagnosis not present

## 2018-03-27 MED ORDER — DILTIAZEM HCL 60 MG PO TABS: 60.0000 mg | ORAL_TABLET | ORAL | 3 refills | Status: DC | PRN

## 2018-03-27 MED ORDER — NITROGLYCERIN 0.4 MG SL SUBL: 0.4000 mg | SUBLINGUAL_TABLET | SUBLINGUAL | 3 refills | Status: DC | PRN

## 2018-03-27 NOTE — Progress Notes (Signed)
Cardiology Office Note:    Date:  03/27/2018   ID:  Jordan [name], DOB Dec 10, 1945, MRN [id number]  PCP:  Chipper Herb, MD  Cardiologist:  Candee Furbish, MD  Electrophysiologist:  None   Referring MD: Chipper Herb, MD     History of Present Illness:    Jordan [name] is a 72 y.o. male here for follow-up of coronary artery disease paroxysmal atrial fibrillation.  Had an LAD stent in 2002 with prior nuclear stress test in 2011 showing small possible apical scar.  EF normal.  Sometimes heat bothers him. Varicose vein.  Support hose.   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  Has 7 grandchildren.  Very active.  Takes a grandchildren on a ski trip every year.  [place] last.  Has had GERD in the past.  Perhaps this was with green tea.  Past Medical History:  Diagnosis Date  . Anxiety    Mild  . Atrial fibrillation (HCC)    Paroxysmal  . BPH (benign prostatic hyperplasia)   . CAD (coronary artery disease)    Stent LAD, 2002  /  nuclear 2009, no ischemia  /    [facility] October, 2011 nuclear, no ischemia, possible slight apical scar, ejection fraction 70%  . Carotid bruit    Doppler November, 2011, normal  . Chronic anticoagulation    Low CHADS , score, but patient wants to be aggressive  . Colon polyps   . Drug intolerance    Mild beta blocker intolerance  . Dyslipidemia   . Ejection fraction    EF 65-70%, echo, 2007 /  EF 70%, nuclear, 2011  . Hyperlipidemia   . IBS (irritable bowel syndrome)   . Incomplete RBBB   . Knee pain    November, 201  . Personal history of colonic adenomas 11/29/2006   Qualifier: Diagnosis of  By: [place], [place]    . Syncope    November, 2010    Past Surgical History:  Procedure Laterality Date  . BACK SURGERY  05/1990   [id number]  . CARDIAC CATHETERIZATION    . COLONOSCOPY  multiple    Current Medications: Current Meds  Medication Sig  . apixaban (ELIQUIS) 5 MG TABS tablet Take 1  tablet (5 mg total) by mouth 2 (two) times daily.  Marland Kitchen atorvastatin (LIPITOR) 40 MG tablet Take 1 tablet (40 mg total) by mouth daily at 6 PM.  . Calcium Carb-Cholecalciferol (CALCIUM 600+D3 PO) Take 1 capsule by mouth daily.   . carboxymethylcellulose (REFRESH PLUS) 0.5 % SOLN Place 1 drop into both eyes 3 (three) times daily as needed. For dry eyes  . Cholecalciferol (VITAMIN D3) 1000 UNITS CAPS Take 1,000 Units by mouth daily.   Marland Kitchen diltiazem (CARDIZEM CD) 180 MG 24 hr capsule Take 1 capsule (180 mg total) by mouth daily.  Marland Kitchen diltiazem (CARDIZEM) 60 MG tablet Take 1 tablet (60 mg total) by mouth as needed.  . famotidine (PEPCID) 20 MG tablet Take 1 tablet (20 mg total) by mouth 2 (two) times daily.  . fish oil-omega-3 fatty acids 1000 MG capsule Take 1 g by mouth daily.   . Glucosamine-Chondroitin 1500-1200 MG/30ML LIQD Take 1,500 mg by mouth daily.   . Multiple Vitamins-Minerals (MULTIVITAMIN WITH MINERALS) tablet Take 1 tablet by mouth daily.   . nitroGLYCERIN (NITROSTAT) 0.4 MG SL tablet Place 1 tablet (0.4 mg total) under the tongue every 5 (five) minutes as needed for chest pain.  Marland Kitchen  Probiotic Product (ALIGN PO) Take 1 tablet by mouth daily.   . [DISCONTINUED] diltiazem (CARDIZEM) 60 MG tablet Take 1 tablet (60 mg total) by mouth as needed.  . [DISCONTINUED] nitroGLYCERIN (NITROSTAT) 0.4 MG SL tablet Place 1 tablet (0.4 mg total) under the tongue every 5 (five) minutes as needed for chest pain.     Allergies:   Levofloxacin; Lopid [gemfibrozil]; and Sulfonamide derivatives   Social History   Socioeconomic History  . Marital status: Married    Spouse name: Not on file  . Number of children: Not on file  . Years of education: Not on file  . Highest education level: Not on file  Occupational History  . 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 Financial resource strain: Not on file  . Food insecurity:    Worry: Not on file    Inability: Not on  file  . Transportation needs:    Medical: Not on file    Non-medical: Not on file  Tobacco Use  . Smoking status: Never Smoker  . Smokeless tobacco: Never Used  Substance and Sexual Activity  . Alcohol use: No  . Drug use: No  . Sexual activity: Not on file  Lifestyle  . Physical activity:    Days per week: Not on file    Minutes per session: Not on file  . Stress: Not on file  Relationships  . Social connections:    Talks on phone: Not on file    Gets together: Not on file    Attends religious service: Not on file    Active member of club or organization: Not on file    Attends meetings of clubs or organizations: Not on file    Relationship status: Not on file  Other Topics Concern  . Not on file  Social History Narrative   Married, he has children and grandchildren.   L[place] acres, raises chickens, works as a Neurosurgeon    11/23/2015     Family History: The patient's family history includes Heart attack in his brother and mother. There is no history of Colon cancer.  ROS:   Please see the history of present illness.   Denies any fevers chills nausea vomiting bleeding syncope.  Did have one episode of syncope in 2010. Legs stiff.   All other systems reviewed and are negative.  EKGs/Labs/Other Studies Reviewed:    The following studies were reviewed today: Prior office notes, lab work, EKGs  Exercise treadmill test 10/05/2015 was low risk, 9 minutes of exercise.  EKG:  EKG is  ordered today.  The ekg ordered today demonstrates personally interpreted and reviewed normal sinus rhythm 61 with no other abnormal findings, no change when compared to prior  Recent Labs: 12/10/2017: ALT 25; BUN 18; Creatinine, Ser 1.02; Hemoglobin 15.0; Platelets 248; Potassium 4.5; Sodium 140  Recent Lipid Panel    Component Value Date/Time   CHOL 121 12/10/2017 1247   CHOL 125 03/06/2013 0821   TRIG 100 12/10/2017 1247   TRIG 95 05/28/2017 1247   TRIG 73 03/06/2013 0821    HDL 34 (L) 12/10/2017 1247   HDL 35 (L) 05/28/2017 1247   HDL 40 03/06/2013 0821   CHOLHDL 3.6 12/10/2017 1247   CHOLHDL 2.8 02/24/2013 0900   VLDL 16 02/24/2013 0900   LDLCALC 67 12/10/2017 1247   LDLCALC 68 11/18/2013 0804   LDLCALC 70 03/06/2013 0821    Physical Exam:  VS:  BP 110/70   Pulse 61   Ht 6\' 2"  (1.88 m)   Wt 224 lb 6.4 oz (101.8 kg)   BMI 28.81 kg/m     Wt Readings from Last 3 Encounters:  03/27/18 224 lb 6.4 oz (101.8 kg)  12/10/17 221 lb (100.2 kg)  09/05/17 222 lb (100.7 kg)     GEN:  Well nourished, well developed in no acute distress HEENT: Normal NECK: No JVD; No carotid bruits LYMPHATICS: No lymphadenopathy CARDIAC: RRR, no murmurs, rubs, gallops RESPIRATORY:  Clear to auscultation without rales, wheezing or rhonchi  ABDOMEN: Soft, non-tender, non-distended, mildly protuberant MUSCULOSKELETAL:  No edema; Varicose, TED hose. No deformity  SKIN: Warm and dry NEUROLOGIC:  Alert and oriented x 3 PSYCHIATRIC:  Normal affect   ASSESSMENT:    1. Paroxysmal atrial fibrillation (HCC)   2. Coronary artery disease involving native coronary artery of native heart without angina pectoris   3. Long term (current) use of anticoagulants   4. hyperlipidemia    PLAN:    In order of problems listed above:  Paroxysmal atrial fibrillation - Continue with apixaban.  No bleeding.  Very rare episodes.  CHADSVASc score is 2. -Has diltiazem CD 180 mg once a day.  Also has diltiazem 60 mg as needed.  Doing very well in sinus rhythm currently.  Coronary artery disease status post LAD stent 2002 - ETT 2017 low risk 9 minutes.  Excellent.  Prior to that nuclear stress test 2011 was low risk with small apical defect. - Continue with Eliquis, no aspirin.  Stable.  Statin.  Chronic anticoagulation - In the past have discussed stopping fish oil because of potential bleeding side effects with Eliquis.  Creatinine 1.0, hemoglobin 14.9  Carotid bruit -Prior normal  Doppler 2011, difficult to appreciate on exam.  Hyperlipidemia, mixed - 40 mg atorvastatin.  Also taking fish oil.  Triglycerides 100 LDL 67 HDL 34 total cholesterol 121  Right knee pain -Meniscal tear.    Medication Adjustments/Labs and Tests Ordered: Current medicines are reviewed at length with the patient today.  Concerns regarding medicines are outlined above.  Orders Placed This Encounter  Procedures  . EKG 12-Lead   Meds ordered this encounter  Medications  . diltiazem (CARDIZEM) 60 MG tablet    Sig: Take 1 tablet (60 mg total) by mouth as needed.    Dispense:  90 tablet    Refill:  3  . nitroGLYCERIN (NITROSTAT) 0.4 MG SL tablet    Sig: Place 1 tablet (0.4 mg total) under the tongue every 5 (five) minutes as needed for chest pain.    Dispense:  25 tablet    Refill:  3    Patient Instructions  Medication Instructions:  Your physician recommends that you continue on your current medications as directed. Please refer to the Current Medication list given to you today.  If you need a refill on your cardiac medications before your next appointment, please call your pharmacy.   Lab work: none If you have labs (blood work) drawn today and your tests are completely normal, you will receive your results only by: Marland Kitchen MyChart Message (if you have MyChart) OR . A paper copy in the mail If you have any lab test that is abnormal or we need to change your treatment, we will call you to review the results.  Testing/Procedures: none  Follow-Up: At [facility], you and your health needs are our priority.  As part of our continuing mission to provide you  with exceptional heart care, we have created designated Provider Care Teams.  These Care Teams include your primary Cardiologist (physician) and Advanced Practice Providers (APPs -  Physician Assistants and Nurse Practitioners) who all work together to provide you with the care you need, when you need it. You will need a follow up  appointment in:  6 months.  Please call our office 2 months in advance to schedule this appointment.  You may see Candee Furbish, MD or one of the following Advanced Practice Providers on your designated Care Team: Richardson Dopp, PA-C Vin Blanchard, [place] . Daune Perch, NP  Any Other Special Instructions Will Be Listed Below (If Applicable). none      Signed, Candee Furbish, MD  03/27/2018 10:19 AM    [place]

## 2018-03-27 NOTE — Patient Instructions (Addendum)
Medication Instructions:  Your physician recommends that you continue on your current medications as directed. Please refer to the Current Medication list given to you today.  If you need a refill on your cardiac medications before your next appointment, please call your pharmacy.   Lab work: none If you have labs (blood work) drawn today and your tests are completely normal, you will receive your results only by: Marland Kitchen MyChart Message (if you have MyChart) OR . A paper copy in the mail If you have any lab test that is abnormal or we need to change your treatment, we will call you to review the results.  Testing/Procedures: none  Follow-Up: At [facility], you and your health needs are our priority.  As part of our continuing mission to provide you with exceptional heart care, we have created designated Provider Care Teams.  These Care Teams include your primary Cardiologist (physician) and Advanced Practice Providers (APPs -  Physician Assistants and Nurse Practitioners) who all work together to provide you with the care you need, when you need it. You will need a follow up appointment in:  6 months.  Please call our office 2 months in advance to schedule this appointment.  You may see Candee Furbish, MD or one of the following Advanced Practice Providers on your designated Care Team: Richardson Dopp, PA-C Vin Rose [place], [place] . Daune Perch, NP  Any Other Special Instructions Will Be Listed Below (If Applicable). none

## 2018-06-19 ENCOUNTER — Ambulatory Visit (INDEPENDENT_AMBULATORY_CARE_PROVIDER_SITE_OTHER): Payer: Medicare Other | Admitting: Family Medicine

## 2018-06-19 ENCOUNTER — Encounter: Payer: Self-pay | Admitting: Family Medicine

## 2018-06-19 VITALS — BP 113/70 | HR 66 | Temp 96.7°F | Ht 74.0 in | Wt 223.0 lb

## 2018-06-19 DIAGNOSIS — E559 Vitamin D deficiency, unspecified: Secondary | ICD-10-CM | POA: Diagnosis not present

## 2018-06-19 DIAGNOSIS — I480 Paroxysmal atrial fibrillation: Secondary | ICD-10-CM | POA: Diagnosis not present

## 2018-06-19 DIAGNOSIS — N400 Enlarged prostate without lower urinary tract symptoms: Secondary | ICD-10-CM

## 2018-06-19 DIAGNOSIS — E782 Mixed hyperlipidemia: Secondary | ICD-10-CM | POA: Diagnosis not present

## 2018-06-19 DIAGNOSIS — R972 Elevated prostate specific antigen [PSA]: Secondary | ICD-10-CM | POA: Diagnosis not present

## 2018-06-19 NOTE — Patient Instructions (Addendum)
Medicare Annual Wellness Visit  [facility] and the medical providers at [facility] strive to bring you the best medical care.  In doing so we not only want to address your current medical conditions and concerns but also to detect new conditions early and prevent illness, disease and health-related problems.    Medicare offers a yearly Wellness Visit which allows our clinical staff to assess your need for preventative services including immunizations, lifestyle education, counseling to decrease risk of preventable diseases and screening for fall risk and other medical concerns.    This visit is provided free of charge (no copay) for all Medicare recipients. The clinical pharmacists at [facility] have begun to conduct these Wellness Visits which will also include a thorough review of all your medications.    As you primary medical provider recommend that you make an appointment for your Annual Wellness Visit if you have not done so already this year.  You may set up this appointment before you leave today or you may call back ([phone]) and schedule an appointment.  Please make sure when you call that you mention that you are scheduling your Annual Wellness Visit with the clinical pharmacist so that the appointment may be made for the proper length of time.     Continue current medications. Continue good therapeutic lifestyle changes which include good diet and exercise. Fall precautions discussed with patient. If an FOBT was given today- please return it to our front desk. If you are over 42 years old - you may need Prevnar 53 or the adult Pneumonia vaccine.  **Flu shots are available--- please call and schedule a FLU-CLINIC appointment**  After your visit with [place] today you will receive a survey in the mail or online from [employer] regarding your care with [place]. Please take a moment to fill this out. Your feedback is very  important to [place] as you can help [place] better understand your patient needs as well as improve your experience and satisfaction. WE CARE ABOUT YOU!!!   Continue to eat healthy take current medicines and follow-up with cardiology as planned Do not forget to get your repeat colonoscopy in the summer 2022 with Dr. Carlean Purl Follow-up with Dr. Jeffie Pollock as planned Keep getting eye exams yearly

## 2018-06-19 NOTE — Progress Notes (Signed)
Subjective:    Patient ID: Jordan [name], male    DOB: Apr 01, 1946, 73 y.o.   MRN: [id number]  HPI Pt here for follow up and management of chronic medical problems which includes a fib and hyperlipidemia. He is taking medication regularly.  Patient comes in today for his regular 45-monthexam.  He recently saw Dr. SMarlou Porchthe cardiologist and everything appeared to be stable from his standpoint because of coronary artery disease and paroxysmal atrial fibrillation.  He will see him again in 6 months.  The patient will get routine lab work today will be given an FOBT to return.  He had a colonoscopy in July 2017.  Is pleasant and laid back with no specific complaints.  He denies any chest pain or shortness of breath.  He denies any major issues with palpitations and says when he does have this it hits him suddenly usually happens at nighttime and after he is fatigued during the day.  He will routinely follow-up with his cardiologist 6 months from the previous visit.  He denies any trouble with his stomach including nausea vomiting swallowing issues heartburn blood in the stool or black tarry bowel movements.  He did have a colonoscopy in 2017 by Dr. GCarlean Purland one polyp was found and it was recommended that he have a repeat colonoscopy 5 years from that time.  Everything has been stable since then with no change in bowel habits.  He is passing his water well and routinely follows up with the urologist because of a past history of elevated PSAs.  He is up-to-date on his eye exams.  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   Patient Active Problem List   Diagnosis Date Noted  . Long term (current) use of anticoagulants 06/24/2015  . 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 Chest pain 02/23/2013  . Ejection fraction   . [place]   . Drug  intolerance   . Incomplete RBBB   . Carotid bruit   . Knee pain   . Anxiety   . hyperlipidemia   . Palpitations   . BPH (benign prostatic hypertrophy) 02/06/2009  . Personal history of colonic adenomas 11/29/2006   Outpatient Encounter Medications as of 06/19/2018  Medication Sig  . apixaban (ELIQUIS) 5 MG TABS tablet Take 1 tablet (5 mg total) by mouth 2 (two) times daily.  .Marland Kitchenatorvastatin (LIPITOR) 40 MG tablet Take 1 tablet (40 mg total) by mouth daily at 6 PM.  . Calcium Carb-Cholecalciferol (CALCIUM 600+D3 PO) Take 1 capsule by mouth daily.   . carboxymethylcellulose (REFRESH PLUS) 0.5 % SOLN Place 1 drop into both eyes 3 (three) times daily as needed. For dry eyes  . Cholecalciferol (VITAMIN D3) 1000 UNITS CAPS Take 1,000 Units by mouth daily.   .Marland Kitchendiltiazem (CARDIZEM CD) 180 MG 24 hr capsule Take 1 capsule (180 mg total) by mouth daily.  .Marland Kitchendiltiazem (CARDIZEM) 60 MG tablet Take 1 tablet (60 mg total) by mouth as needed.  . famotidine (PEPCID) 20 MG tablet Take 1 tablet (20 mg total) by mouth 2 (two) times daily.  . fish oil-omega-3 fatty acids 1000 MG capsule Take 1 g by mouth daily.   . Glucosamine-Chondroitin 1500-1200 MG/30ML LIQD Take 1,500 mg by mouth daily.   . Multiple Vitamins-Minerals (MULTIVITAMIN WITH MINERALS) tablet Take 1 tablet by mouth  daily.   . nitroGLYCERIN (NITROSTAT) 0.4 MG SL tablet Place 1 tablet (0.4 mg total) under the tongue every 5 (five) minutes as needed for chest pain.  . Probiotic Product (ALIGN PO) Take 1 tablet by mouth daily.    No facility-administered encounter medications on file as of 06/19/2018.      Review of Systems  Constitutional: Negative.   HENT: Negative.   Eyes: Negative.   Respiratory: Negative.   Cardiovascular: Negative.   Gastrointestinal: Negative.   Endocrine: Negative.   Genitourinary: Negative.   Musculoskeletal: Negative.   Skin: Negative.   Allergic/Immunologic: Negative.   Neurological: Negative.   Hematological:  Negative.   Psychiatric/Behavioral: Negative.        Objective:   Physical Exam Vitals signs and nursing note reviewed.  Constitutional:      Appearance: Normal appearance. He is well-developed. He is obese. He is not ill-appearing.     Comments: Patient is pleasant and alert and calm  HENT:     Head: Normocephalic and atraumatic.     Right Ear: Tympanic membrane, ear canal and external ear normal. There is no impacted cerumen.     Left Ear: Tympanic membrane, ear canal and external ear normal. There is no impacted cerumen.     Nose: Nose normal. No congestion.     Mouth/Throat:     Mouth: Mucous membranes are dry.     Pharynx: Oropharynx is clear. No oropharyngeal exudate.  Eyes:     General: No scleral icterus.       Right eye: No discharge.        Left eye: No discharge.     Conjunctiva/sclera: Conjunctivae normal.     Pupils: Pupils are equal, round, and reactive to light.  Neck:     Musculoskeletal: Normal range of motion and neck supple.     Thyroid: No thyromegaly.     Vascular: No carotid bruit.     Trachea: No tracheal deviation.     Comments: No bruits audible today.  No anterior cervical or posterior cervical adenopathy or thyromegaly. Cardiovascular:     Rate and Rhythm: Normal rate and regular rhythm.     Heart sounds: Normal heart sounds. No murmur. No friction rub. No gallop.      Comments: The heart is regular at 60/min with good pedal pulses. Pulmonary:     Effort: Pulmonary effort is normal. No respiratory distress.     Breath sounds: Normal breath sounds. No wheezing or rales.     Comments: Clear anteriorly and posteriorly and no axillary adenopathy.  No chest wall masses. Chest:     Chest wall: No tenderness.  Abdominal:     General: Abdomen is flat. Bowel sounds are normal. There is no distension.     Palpations: Abdomen is soft. There is no mass.     Tenderness: There is no abdominal tenderness. There is no guarding or rebound.  Genitourinary:     Comments: Urology as planned Musculoskeletal: Normal range of motion.        General: No tenderness.     Right lower leg: No edema.     Comments: Good movement of all extremities with no significant edema.  Bilateral lower extremity varicosities.  Lymphadenopathy:     Cervical: No cervical adenopathy.  Skin:    General: Skin is warm and dry.     Coloration: Skin is not pale.     Findings: No erythema or rash.  Neurological:     General: No focal  deficit present.     Mental Status: He is alert and oriented to person, place, and time. Mental status is at baseline.     Cranial Nerves: No cranial nerve deficit.     Deep Tendon Reflexes: Reflexes are normal and symmetric.  Psychiatric:        Mood and Affect: Mood normal.        Behavior: Behavior normal.        Thought Content: Thought content normal.        Judgment: Judgment normal.     Comments: Mood affect and behavior are all normal for this patient    BP 113/70 (BP Location: Left Arm)   Pulse 66   Temp (!) 96.7 F (35.9 C) (Oral)   Ht 6' 2"  (1.88 m)   Wt 223 lb (101.2 kg)   BMI 28.63 kg/m         Assessment & Plan:  1. Mixed hyperlipidemia -Continue current treatment pending results of lab work - CBC with Differential/Platelet - Lipid panel - Hepatic function panel  2. Vitamin D deficiency -Continue with calcium and vitamin D replacement pending results of lab work - CBC with Differential/Platelet - VITAMIN D 25 Hydroxy (Vit-D Deficiency, Fractures)  3. Paroxysmal atrial fibrillation (HCC) -Follow-up with Dr. Marlou Porch as planned about every 6 months - BMP8+EGFR - CBC with Differential/Platelet  4. Elevated PSA -Follow-up with Dr. Jeffie Pollock, urology as recommended - CBC with Differential/Platelet  5. Benign prostatic hyperplasia without lower urinary tract symptoms -Follow-up with Dr. Jeffie Pollock  Patient Instructions                       Medicare Annual [facility] and the medical providers at  [facility] strive to bring you the best medical care.  In doing so we not only want to address your current medical conditions and concerns but also to detect new conditions early and prevent illness, disease and health-related problems.    Medicare offers a yearly Wellness Visit which allows our clinical staff to assess your need for preventative services including immunizations, lifestyle education, counseling to decrease risk of preventable diseases and screening for fall risk and other medical concerns.    This visit is provided free of charge (no copay) for all Medicare recipients. The clinical pharmacists at [facility] have begun to conduct these Wellness Visits which will also include a thorough review of all your medications.    As you primary medical provider recommend that you make an appointment for your Annual Wellness Visit if you have not done so already this year.  You may set up this appointment before you leave today or you may call back ([phone]) and schedule an appointment.  Please make sure when you call that you mention that you are scheduling your Annual Wellness Visit with the clinical pharmacist so that the appointment may be made for the proper length of time.     Continue current medications. Continue good therapeutic lifestyle changes which include good diet and exercise. Fall precautions discussed with patient. If an FOBT was given today- please return it to our front desk. If you are over 25 years old - you may need Prevnar 31 or the adult Pneumonia vaccine.  **Flu shots are available--- please call and schedule a FLU-CLINIC appointment**  After your visit with [place] today you will receive a survey in the mail or online from [employer] regarding your care with [place]. Please  take a moment to fill this out. Your feedback is very important to [place] as you can help [place] better understand your patient needs as well as improve your  experience and satisfaction. WE CARE ABOUT YOU!!!   Continue to eat healthy take current medicines and follow-up with cardiology as planned Do not forget to get your repeat colonoscopy in the summer 2022 with Dr. Carlean Purl Follow-up with Dr. Jeffie Pollock as planned Keep getting eye exams yearly  Arrie Senate MD

## 2018-06-20 ENCOUNTER — Other Ambulatory Visit: Payer: Self-pay | Admitting: *Deleted

## 2018-06-20 LAB — LIPID PANEL
CHOL/HDL RATIO: 3.8 ratio (ref 0.0–5.0)
Cholesterol, Total: 145 mg/dL (ref 100–199)
HDL: 38 mg/dL — AB (ref >39)
LDL Calculated: 89 mg/dL (ref 0–99)
TRIGLYCERIDES: 88 mg/dL (ref 0–149)
VLDL CHOLESTEROL CAL: 18 mg/dL (ref 5–40)

## 2018-06-20 LAB — CBC WITH DIFFERENTIAL/PLATELET
BASOS: 1 %
Basophils Absolute: 0 10*3/uL (ref 0.0–0.2)
EOS (ABSOLUTE): 0.2 10*3/uL (ref 0.0–0.4)
EOS: 3 %
HEMATOCRIT: 44.2 % (ref 37.5–51.0)
Hemoglobin: 15.3 g/dL (ref 13.0–17.7)
IMMATURE GRANS (ABS): 0 10*3/uL (ref 0.0–0.1)
IMMATURE GRANULOCYTES: 0 %
LYMPHS: 21 %
Lymphocytes Absolute: 1.5 10*3/uL (ref 0.7–3.1)
MCH: 30.3 pg (ref 26.6–33.0)
MCHC: 34.6 g/dL (ref 31.5–35.7)
MCV: 88 fL (ref 79–97)
MONOS ABS: 0.6 10*3/uL (ref 0.1–0.9)
Monocytes: 9 %
NEUTROS PCT: 66 %
Neutrophils Absolute: 4.9 10*3/uL (ref 1.4–7.0)
PLATELETS: 247 10*3/uL (ref 150–450)
RBC: 5.05 x10E6/uL (ref 4.14–5.80)
RDW: 12.8 % (ref 11.6–15.4)
WBC: 7.4 10*3/uL (ref 3.4–10.8)

## 2018-06-20 LAB — BMP8+EGFR
BUN/Creatinine Ratio: 17 (ref 10–24)
BUN: 17 mg/dL (ref 8–27)
CALCIUM: 9.2 mg/dL (ref 8.6–10.2)
CO2: 26 mmol/L (ref 20–29)
Chloride: 103 mmol/L (ref 96–106)
Creatinine, Ser: 1.03 mg/dL (ref 0.76–1.27)
GFR calc Af Amer: 84 mL/min/{1.73_m2} (ref >59)
GFR, EST NON AFRICAN AMERICAN: 73 mL/min/{1.73_m2} (ref >59)
GLUCOSE: 87 mg/dL (ref 65–99)
Potassium: 4.4 mmol/L (ref 3.5–5.2)
SODIUM: 142 mmol/L (ref 134–144)

## 2018-06-20 LAB — HEPATIC FUNCTION PANEL
ALT: 26 IU/L (ref 0–44)
AST: 28 IU/L (ref 0–40)
Albumin: 4.3 g/dL (ref 3.5–4.8)
Alkaline Phosphatase: 67 IU/L (ref 39–117)
Bilirubin Total: 0.8 mg/dL (ref 0.0–1.2)
Bilirubin, Direct: 0.22 mg/dL (ref 0.00–0.40)
Total Protein: 6.7 g/dL (ref 6.0–8.5)

## 2018-06-20 LAB — VITAMIN D 25 HYDROXY (VIT D DEFICIENCY, FRACTURES): VIT D 25 HYDROXY: 52.8 ng/mL (ref 30.0–100.0)

## 2018-06-20 MED ORDER — ICOSAPENT ETHYL 1 G PO CAPS: 2.0000 | ORAL_CAPSULE | Freq: Two times a day (BID) | ORAL | 3 refills | Status: DC

## 2018-07-05 DIAGNOSIS — Z85828 Personal history of other malignant neoplasm of skin: Secondary | ICD-10-CM | POA: Diagnosis not present

## 2018-07-05 DIAGNOSIS — L820 Inflamed seborrheic keratosis: Secondary | ICD-10-CM | POA: Diagnosis not present

## 2018-07-05 DIAGNOSIS — L309 Dermatitis, unspecified: Secondary | ICD-10-CM | POA: Diagnosis not present

## 2018-07-05 DIAGNOSIS — L821 Other seborrheic keratosis: Secondary | ICD-10-CM | POA: Diagnosis not present

## 2018-09-24 ENCOUNTER — Encounter: Payer: Self-pay | Admitting: Internal Medicine

## 2018-10-04 ENCOUNTER — Telehealth: Payer: Self-pay | Admitting: Cardiology

## 2018-10-04 NOTE — Telephone Encounter (Signed)
New message     Virtual video visit made on 10-11-18.  Pt gave consent for visit.  YOUR CARDIOLOGY TEAM HAS ARRANGED FOR AN E-VISIT FOR YOUR APPOINTMENT - PLEASE REVIEW IMPORTANT INFORMATION BELOW SEVERAL DAYS PRIOR TO YOUR APPOINTMENT  Due to the recent COVID-19 pandemic, we are transitioning in-person office visits to tele-medicine visits in an effort to decrease unnecessary exposure to our patients, their families, and staff. These visits are billed to your insurance just like a normal visit is. We also encourage you to sign up for MyChart if you have not already done so. You will need a smartphone if possible. For patients that do not have this, we can still complete the visit using a regular telephone but do prefer a smartphone to enable video when possible. You may have a family member that lives with you that can help. If possible, we also ask that you have a blood pressure cuff and scale at home to measure your blood pressure, heart rate and weight prior to your scheduled appointment. Patients with clinical needs that need an in-person evaluation and testing will still be able to come to the office if absolutely necessary. If you have any questions, feel free to call our office.     YOUR PROVIDER WILL BE USING THE FOLLOWING PLATFORM TO COMPLETE YOUR VISIT: Doximity   IF USING MYCHART - How to Download the MyChart App to Your SmartPhone   - If Apple, go to [employer] and type in MyChart in the search bar and download the app. If Android, ask patient to go to [employer] and type in [place] in the search bar and download the app. The app is free but as with any other app downloads, your phone may require you to verify saved payment information or Apple/Android password.  - You will need to then log into the app with your MyChart username and password, and select [facility] as your healthcare provider to link the account.  - When it is time for your visit, go to the MyChart app, find  appointments, and click Begin Video Visit. Be sure to Select Allow for your device to access the Microphone and Camera for your visit. You will then be connected, and your provider will be with you shortly.  **If you have any issues connecting or need assistance, please contact MyChart service desk [phone])**  **If using a computer, in order to ensure the best quality for your visit, you will need to use either of the following Internet Browsers: Insurance underwriter or Microsoft Edge**   IF USING DOXIMITY or [email or web address] - The staff will give you instructions on receiving your link to join the meeting the day of your visit.      2-3 DAYS BEFORE YOUR APPOINTMENT  You will receive a telephone call from one of our [facility] team members - your caller ID may say "Unknown caller." If this is a video visit, we will walk you through how to get the video launched on your phone. We will remind you check your blood pressure, heart rate and weight prior to your scheduled appointment. If you have an Apple Watch or Kardia, please upload any pertinent ECG strips the day before or morning of your appointment to [place]. Our staff will also make sure you have reviewed the consent and agree to move forward with your scheduled tele-health visit.     THE DAY OF YOUR APPOINTMENT  Approximately 15 minutes prior to your scheduled appointment, you  will receive a telephone call from one of [facility] team - your caller ID may say "Unknown caller."  Our staff will confirm medications, vital signs for the day and any symptoms you may be experiencing. Please have this information available prior to the time of visit start. It may also be helpful for you to have a pad of paper and pen handy for any instructions given during your visit. They will also walk you through joining the smartphone meeting if this is a video visit.    CONSENT FOR TELE-HEALTH VISIT - PLEASE REVIEW  I hereby voluntarily request, consent  and authorize [facility] and its employed or contracted physicians, physician assistants, nurse practitioners or other licensed health care professionals (the Practitioner), to provide me with telemedicine health care services (the Services") as deemed necessary by the treating Practitioner. I acknowledge and consent to receive the Services by the Practitioner via telemedicine. I understand that the telemedicine visit will involve communicating with the Practitioner through live audiovisual communication technology and the disclosure of certain medical information by electronic transmission. I acknowledge that I have been given the opportunity to request an in-person assessment or other available alternative prior to the telemedicine visit and am voluntarily participating in the telemedicine visit.  I understand that I have the right to withhold or withdraw my consent to the use of telemedicine in the course of my care at any time, without affecting my right to future care or treatment, and that the Practitioner or I may terminate the telemedicine visit at any time. I understand that I have the right to inspect all information obtained and/or recorded in the course of the telemedicine visit and may receive copies of available information for a reasonable fee.  I understand that some of the potential risks of receiving the Services via telemedicine include:   Delay or interruption in medical evaluation due to technological equipment failure or disruption;  Information transmitted may not be sufficient (e.g. poor resolution of images) to allow for appropriate medical decision making by the Practitioner; and/or   In rare instances, security protocols could fail, causing a breach of personal health information.  Furthermore, I acknowledge that it is my responsibility to provide information about my medical history, conditions and care that is complete and accurate to the best of my ability. I acknowledge  that Practitioner's advice, recommendations, and/or decision may be based on factors not within their control, such as incomplete or inaccurate data provided by me or distortions of diagnostic images or specimens that may result from electronic transmissions. I understand that the practice of medicine is not an exact science and that Practitioner makes no warranties or guarantees regarding treatment outcomes. I acknowledge that I will receive a copy of this consent concurrently upon execution via email to the email address I last provided but may also request a printed copy by calling the office of [facility].    I understand that my insurance will be billed for this visit.   I have read or had this consent read to me.  I understand the contents of this consent, which adequately explains the benefits and risks of the Services being provided via telemedicine.   I have been provided ample opportunity to ask questions regarding this consent and the Services and have had my questions answered to my satisfaction.  I give my informed consent for the services to be provided through the use of telemedicine in my medical care  By participating in this telemedicine visit I  agree to the above.

## 2018-10-10 NOTE — Progress Notes (Signed)
Virtual Visit via Telephone Note   This visit type was conducted due to national recommendations for restrictions regarding the COVID-19 Pandemic (e.g. social distancing) in an effort to limit this patient's exposure and mitigate transmission in our community.  Due to his co-morbid illnesses, this patient is at least at moderate risk for complications without adequate follow up.  This format is felt to be most appropriate for this patient at this time.  The patient did not have access to video technology/had technical difficulties with video requiring transitioning to audio format only (telephone).  All issues noted in this document were discussed and addressed.  No physical exam could be performed with this format.  Please refer to the patient's chart for his  consent to telehealth for [facility].   We attempted video but would not go through  Date:  10/11/2018   ID:  [name], DOB June 05, 1946, MRN [id number]  Patient Location: Other:  work Provider Location: Office  PCP:  Chipper Herb, MD  Cardiologist:  Candee Furbish, MD  Electrophysiologist:  None   Evaluation Performed:  Follow-Up Visit  Chief Complaint:  CAD and PAF  History of Present Illness:    Jordan [name] is a 73 y.o. male with a hx of coronary artery disease paroxysmal atrial fibrillation.  Had an LAD stent in 2002 with prior nuclear stress test in 2011 showing small possible apical scar.  EF normal.  Sometimes heat bothers him. Varicose vein.  Support hose.   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  Has 7 grandchildren.  Very active.  Takes a grandchildren on a ski trip every year.  [place] last. He was also in [place] and talks about what he did with the war.  Has had GERD in the past.  Perhaps this was with green tea.  Today he is doing well -no chest pain or SOB.  He does have PAF this year he has had 2 episodes.  He will take short acting dilt and it  resolves.  Difficult to increase his daily dose due to lower BP.  No dizziness or lightheadedness.  He continues on his fish oil and eliquis.  He could not afford vascepa.  Recent LDL with PCP was 89.  He has no bleeding.   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  He and his wife walk daily over 8,000 steps.  He also rides his bike and works in the yard.  He has no colds or fevers and wears a mask in public and washes hands freq.   The patient does not have symptoms concerning for COVID-19 infection (fever, chills, cough, or new shortness of breath).    Past Medical History:  Diagnosis Date  . Anxiety    Mild  . Atrial fibrillation (HCC)    Paroxysmal  . BPH (benign prostatic hyperplasia)   . CAD (coronary artery disease)    Stent LAD, 2002  /  nuclear 2009, no ischemia  /    [facility] October, 2011 nuclear, no ischemia, possible slight apical scar, ejection fraction 70%  . Carotid bruit    Doppler November, 2011, normal  . Chronic anticoagulation    Low CHADS , score, but patient wants to be aggressive  . Colon polyps   . Drug intolerance    Mild beta blocker intolerance  . Dyslipidemia   . Ejection  fraction    EF 65-70%, echo, 2007 /  EF 70%, nuclear, 2011  . Hyperlipidemia   . IBS (irritable bowel syndrome)   . Incomplete RBBB   . Knee pain    November, 201  . Personal history of colonic adenomas 11/29/2006   Qualifier: Diagnosis of  By: [place], [place]    . Syncope    November, 2010   Past Surgical History:  Procedure Laterality Date  . BACK SURGERY  05/1990   [id number]  . CARDIAC CATHETERIZATION    . COLONOSCOPY  multiple     Current Meds  Medication Sig  . apixaban (ELIQUIS) 5 MG TABS tablet Take 1 tablet (5 mg total) by mouth 2 (two) times daily.  Marland Kitchen atorvastatin (LIPITOR) 40 MG tablet Take 1 tablet (40 mg total) by mouth daily at 6 PM.  . Calcium Carb-Cholecalciferol (CALCIUM  600+D3 PO) Take 1 capsule by mouth daily.   . carboxymethylcellulose (REFRESH PLUS) 0.5 % SOLN Place 1 drop into both eyes 3 (three) times daily as needed. For dry eyes  . Cholecalciferol (VITAMIN D3) 1000 UNITS CAPS Take 1,000 Units by mouth daily.   Marland Kitchen diltiazem (CARDIZEM CD) 180 MG 24 hr capsule Take 1 capsule (180 mg total) by mouth daily.  Marland Kitchen diltiazem (CARDIZEM) 60 MG tablet Take 1 tablet (60 mg total) by mouth as needed.  . fish oil-omega-3 fatty acids 1000 MG capsule Take 1 g by mouth daily.   . Glucosamine-Chondroitin 1500-1200 MG/30ML LIQD Take 1,500 mg by mouth daily.   . Multiple Vitamins-Minerals (MULTIVITAMIN WITH MINERALS) tablet Take 1 tablet by mouth daily.   . nitroGLYCERIN (NITROSTAT) 0.4 MG SL tablet Place 1 tablet (0.4 mg total) under the tongue every 5 (five) minutes as needed for chest pain.  . Probiotic Product (ALIGN PO) Take 1 tablet by mouth daily. As needed     Allergies:   Levofloxacin; Lopid [gemfibrozil]; and Sulfonamide derivatives   Social History   Tobacco Use  . Smoking status: Never Smoker  . Smokeless tobacco: Never Used  Substance Use Topics  . Alcohol use: No  . Drug use: No     Family Hx: The patient's family history includes Heart attack in his brother and mother. There is no history of Colon cancer.  ROS:   Please see the history of present illness.    General:no colds or fevers, no weight changes Skin:no rashes or ulcers HEENT:no blurred vision, no congestion CV:see HPI PUL:see HPI GI:no diarrhea constipation or melena, no indigestion GU:no hematuria, no dysuria MS:no joint pain, no claudication Neuro:no syncope, no lightheadedness Endo:no diabetes, no thyroid disease  All other systems reviewed and are negative.   Prior CV studies:   The following studies were reviewed today:  ETT 10/05/15 Response to Stress There was no ST segment deviation noted during stress.  Arrhythmias during stress:  none.   Arrhythmias during recovery:   rare rare, PACs.     Arrhythmias were not significant.   ECG was interpretable and there was no significant change from baseline.      Labs/Other Tests and Data Reviewed:    EKG:  An ECG dated 03/27/18 was personally reviewed today and demonstrated:  SR normal EKG  Recent Labs: 06/19/2018: ALT 26; BUN 17; Creatinine, Ser 1.03; Hemoglobin 15.3; Platelets 247; Potassium 4.4; Sodium 142   Recent Lipid Panel Lab Results  Component Value Date/Time   CHOL 145 06/19/2018 11:59 AM   CHOL 125 03/06/2013 08:21 AM   TRIG 88 06/19/2018 11:59  AM   TRIG 95 05/28/2017 12:47 PM   TRIG 73 03/06/2013 08:21 AM   HDL 38 (L) 06/19/2018 11:59 AM   HDL 35 (L) 05/28/2017 12:47 PM   HDL 40 03/06/2013 08:21 AM   CHOLHDL 3.8 06/19/2018 11:59 AM   CHOLHDL 2.8 02/24/2013 09:00 AM   LDLCALC 89 06/19/2018 11:59 AM   LDLCALC 68 11/18/2013 08:04 AM   LDLCALC 70 03/06/2013 08:21 AM    Wt Readings from Last 3 Encounters:  10/11/18 216 lb (98 kg)  06/19/18 223 lb (101.2 kg)  03/27/18 224 lb 6.4 oz (101.8 kg)     Objective:    Vital Signs:  Pulse 62   Ht 6\' 2"  (1.88 m)   Wt 216 lb (98 kg)   BMI 27.73 kg/m    VITAL SIGNS:  reviewed  Neuro alert and Oriented X 3  Lungs: can speak in full sentences without SOB Psych: pleasant affect  ASSESSMENT & PLAN:    1. CAD with stent to LAD in 2002.  Neg nue in 2011 and ETT in 2017 normal. Continue exercise and eating healthy 2. PAF has had 2 episodes this year.  Discussed continuing his meds and thought if increased episodes for a fib ablation in future. 3. HLD stable on statin followed by PCP 4. Anticoagulation on eliquis and no bleeding. 5.   COVID-19 Education: The signs and symptoms of COVID-19 were discussed with the patient and how to seek care for testing (follow up with PCP or arrange E-visit).  The importance of social distancing was discussed today.  Time:   Today, I have spent 25 minutes with the patient with telehealth technology discussing  the above problems.     Medication Adjustments/Labs and Tests Ordered: Current medicines are reviewed at length with the patient today.  Concerns regarding medicines are outlined above.   Tests Ordered: No orders of the defined types were placed in this encounter.   Medication Changes: No orders of the defined types were placed in this encounter.   Disposition:  Follow up in 6 month(s) Dr. Marlou Porch  Signed, Cecilie Kicks, NP  10/11/2018 3:58 PM    [place]

## 2018-10-11 ENCOUNTER — Encounter: Payer: Self-pay | Admitting: Cardiology

## 2018-10-11 ENCOUNTER — Other Ambulatory Visit: Payer: Self-pay

## 2018-10-11 ENCOUNTER — Telehealth (INDEPENDENT_AMBULATORY_CARE_PROVIDER_SITE_OTHER): Payer: Medicare Other | Admitting: Cardiology

## 2018-10-11 VITALS — HR 62 | Ht 74.0 in | Wt 216.0 lb

## 2018-10-11 DIAGNOSIS — I480 Paroxysmal atrial fibrillation: Secondary | ICD-10-CM

## 2018-10-11 DIAGNOSIS — Z7901 Long term (current) use of anticoagulants: Secondary | ICD-10-CM

## 2018-10-11 DIAGNOSIS — E785 Hyperlipidemia, unspecified: Secondary | ICD-10-CM

## 2018-10-11 DIAGNOSIS — I2510 Atherosclerotic heart disease of native coronary artery without angina pectoris: Secondary | ICD-10-CM | POA: Diagnosis not present

## 2018-10-11 NOTE — Patient Instructions (Signed)
Medication Instructions:  Your physician recommends that you continue on your current medications as directed. Please refer to the Current Medication list given to you today.  If you need a refill on your cardiac medications before your next appointment, please call your pharmacy.   Lab work: None ordered  If you have labs (blood work) drawn today and your tests are completely normal, you will receive your results only by: . MyChart Message (if you have MyChart) OR . A paper copy in the mail If you have any lab test that is abnormal or we need to change your treatment, we will call you to review the results.  Testing/Procedures: None ordered  Follow-Up: At [facility], you and your health needs are our priority.  As part of our continuing mission to provide you with exceptional heart care, we have created designated Provider Care Teams.  These Care Teams include your primary Cardiologist (physician) and Advanced Practice Providers (APPs -  Physician Assistants and Nurse Practitioners) who all work together to provide you with the care you need, when you need it. You will need a follow up appointment in 6 months.  Please call our office 2 months in advance to schedule this appointment.  You may see Mark Skains, MD or one of the following Advanced Practice Providers on your designated Care Team:   Lori Gerhardt, NP Laura Ingold, NP . Jill McDaniel, NP  Any Other Special Instructions Will Be Listed Below (If Applicable).    

## 2018-10-24 ENCOUNTER — Ambulatory Visit (INDEPENDENT_AMBULATORY_CARE_PROVIDER_SITE_OTHER): Payer: Medicare Other | Admitting: *Deleted

## 2018-10-24 ENCOUNTER — Other Ambulatory Visit: Payer: Self-pay

## 2018-10-24 VITALS — BP 110/70 | HR 65 | Ht 74.0 in | Wt 215.0 lb

## 2018-10-24 DIAGNOSIS — Z0000 Encounter for general adult medical examination without abnormal findings: Secondary | ICD-10-CM

## 2018-10-24 NOTE — Patient Instructions (Signed)
  Mr. Jordan [name] , Thank you for taking time to come for your Medicare Wellness Visit. I appreciate your ongoing commitment to your health goals. Please review the following plan we discussed and let me know if I can assist you in the future.   These are the goals we discussed: Goals    . Prevent falls     Stay active  Retirement      . Weight (lb) < 200 lb (90.7 kg)       This is a list of the screening recommended for you and due dates:  Health Maintenance  Topic Date Due  . Flu Shot  01/11/2019  . Colon Cancer Screening  12/29/2020  . Tetanus Vaccine  11/29/2026  .  Hepatitis C: One time screening is recommended by Center for Disease Control  (CDC) for  adults born from [birth year] through 1964.   Completed  . Pneumonia vaccines  Completed

## 2018-10-24 NOTE — Progress Notes (Signed)
MEDICARE ANNUAL WELLNESS VISIT  10/24/2018  Telephone Visit Disclaimer This Medicare AWV was conducted by telephone due to national recommendations for restrictions regarding the COVID-19 Pandemic (e.g. social distancing).  I verified, using two identifiers, that I am speaking with Jordan [name] or their authorized healthcare agent. I discussed the limitations, risks, security, and privacy concerns of performing an evaluation and management service by telephone and the potential availability of an in-person appointment in the future. The patient expressed understanding and agreed to proceed.   Subjective:  Jordan [name] is a 73 y.o. male patient of Chipper Herb, MD who had a Medicare Annual Wellness Visit today via telephone. [place] is Working full time and lives with their spouse. he has 3 children. he reports that he is socially active and does interact with friends/family regularly. he is moderately physically active and enjoys fishing.  Patient Care Team: Chipper Herb, MD as PCP - General (Family Medicine) Jerline Pain, MD as PCP - Cardiology (Cardiology) Gatha Mayer, MD (Gastroenterology) Irine Seal, MD (Urology) Jarome Matin, MD as Consulting Physician (Dermatology)  Advanced Directives 10/24/2018 03/05/2017 09/22/2015 03/22/2015 02/23/2013  Does Patient Have a Medical Advance Directive? Yes Yes Yes;No Yes Patient has advance directive, copy not in chart  Type of Advance Directive [place];Living will [place];Living will - [place];Living will [place];Living will  Does patient want to make changes to medical advance directive? No - Patient declined No - Patient declined No - Patient declined - -  Copy of [place] in Chart? No - copy requested No - copy requested - - Copy requested from family  Pre-existing out of facility DNR order (yellow form or pink MOST form) - - -  - No    Hospital Utilization Over the Past 12 Months: # of hospitalizations or ER visits: 0 # of surgeries: 0  Review of Systems    Patient reports that his overall health is unchanged compared to last year.  Patient Reported Readings (BP, Pulse, CBG, Weight, etc) BP 110/70 Comment: home reading  Pulse 65   Ht 6\' 2"  (1.88 m)   Wt 215 lb (97.5 kg)   BMI 27.60 kg/m    Review of Systems: General ROS: negative  All other systems negative.  Pain Assessment       Current Medications & Allergies (verified) Allergies as of 10/24/2018      Reactions   Levofloxacin Other (See Comments)   Couldn't breathe, passed out   Lopid [gemfibrozil] Other (See Comments)   Loose bowels   Sulfonamide Derivatives Hives      Medication List       Accurate as of Oct 24, 2018  9:59 AM. If you have any questions, ask your nurse or doctor.        ALIGN PO Take 1 tablet by mouth daily. As needed   apixaban 5 MG Tabs tablet Commonly known as:  Eliquis Take 1 tablet (5 mg total) by mouth 2 (two) times daily.   atorvastatin 40 MG tablet Commonly known as:  LIPITOR Take 1 tablet (40 mg total) by mouth daily at 6 PM.   CALCIUM 600+D3 PO Take 1 capsule by mouth daily.   carboxymethylcellulose 0.5 % Soln Commonly known as:  REFRESH PLUS Place 1 drop into both eyes 3 (three) times daily as needed. For dry eyes   diltiazem 180 MG 24 hr capsule Commonly known as:  CARDIZEM CD Take 1 capsule (180 mg total) by mouth daily.   diltiazem 60 MG tablet Commonly known as:  CARDIZEM Take 1 tablet (60 mg total) by mouth as needed.   fish oil-omega-3 fatty acids 1000 MG capsule Take 1 g by mouth daily.   Glucosamine-Chondroitin 1500-1200 MG/30ML Liqd Take 1,500 mg by mouth daily.   multivitamin with minerals tablet Take 1 tablet by mouth daily.   nitroGLYCERIN 0.4 MG SL tablet Commonly known as:  NITROSTAT Place 1 tablet (0.4 mg total) under the tongue every 5 (five) minutes as needed  for chest pain.   Vitamin D3 25 MCG (1000 UT) Caps Take 1,000 Units by mouth daily.       History (reviewed): Past Medical History:  Diagnosis Date  . Anxiety    Mild  . Atrial fibrillation (HCC)    Paroxysmal  . BPH (benign prostatic hyperplasia)   . CAD (coronary artery disease)    Stent LAD, 2002  /  nuclear 2009, no ischemia  /    [facility] October, 2011 nuclear, no ischemia, possible slight apical scar, ejection fraction 70%  . Carotid bruit    Doppler November, 2011, normal  . Chronic anticoagulation    Low CHADS , score, but patient wants to be aggressive  . Colon polyps   . Drug intolerance    Mild beta blocker intolerance  . Dyslipidemia   . Ejection fraction    EF 65-70%, echo, 2007 /  EF 70%, nuclear, 2011  . Hyperlipidemia   . IBS (irritable bowel syndrome)   . Incomplete RBBB   . Knee pain    left  . Personal history of colonic adenomas 11/29/2006   Qualifier: Diagnosis of  By: [place], [place]    . Syncope    November, 2010   Past Surgical History:  Procedure Laterality Date  . BACK SURGERY  05/1990   [id number]  . CARDIAC CATHETERIZATION    . COLONOSCOPY  multiple   Family History  Problem Relation Age of Onset  . Heart attack Mother   . Diabetes Mother   . Heart attack Brother   . Heart disease Brother   . Stroke Maternal Grandfather   . Stroke Paternal Grandfather   . Macular degeneration Sister   . Cancer Daughter        colon   . Obesity Sister   . Paranoid behavior Sister   . Cancer Sister        lung - uterus   . COPD Sister   . Colon cancer Neg Hx    Social History   Socioeconomic History  . Marital status: Married    Spouse name: [name]   . Number of children: 3  . Years of education: Not on file  . Highest education level: Not on file  Occupational History  . 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 Financial resource strain: Not on file  . Food insecurity:    Worry: Not on  file    Inability: Not on file  . Transportation needs:    Medical: Not on file    Non-medical: Not on file  Tobacco Use  . Smoking status: Never Smoker  . Smokeless tobacco: Never Used  Substance and Sexual Activity  . Alcohol use: No  . Drug use: No  . Sexual activity: Not on file  Lifestyle  . Physical activity:    Days per week: Not on file    Minutes  per session: Not on file  . Stress: Not on file  Relationships  . Social connections:    Talks on phone: Not on file    Gets together: Not on file    Attends religious service: Not on file    Active member of club or organization: Not on file    Attends meetings of clubs or organizations: Not on file    Relationship status: Not on file  Other Topics Concern  . Not on file  Social History Narrative   Married, he has children and grandchildren.   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 N  Vision? Y  Comment rx glasses   Difficulty concentrating or making decisions? N  Walking or climbing stairs? N  Dressing or bathing? N  Doing errands, shopping? N  Preparing Food and eating ? N  Using the Toilet? N  In the past six months, have you accidently leaked urine? N  Do you have problems with loss of bowel control? N  Managing your Medications? N  Managing your Finances? N  Housekeeping or managing your Housekeeping? N  Some recent data might be hidden    Patient Literacy    Exercise Current Exercise Habits: Home exercise routine, Type of exercise: walking;treadmill, Time (Minutes): 30, Frequency (Times/Week): 5, Weekly Exercise (Minutes/Week): 150, Intensity: Mild, Exercise limited by: None identified  Diet Patient reports consuming 3 meals a day and 1 snack(s) a day Patient reports that his primary diet is: Regular Patient reports  that she does have regular access to food.   Depression Screen PHQ 2/9 Scores 10/24/2018 06/19/2018 12/10/2017 05/28/2017 11/28/2016 05/29/2016 11/25/2015  PHQ - 2 Score 0 0 0 0 0 0 0     Fall Risk Fall Risk  10/24/2018 06/19/2018 12/10/2017 05/28/2017 11/28/2016  Falls in the past year? 0 0 No No No     Objective:  Jordan [name] seemed alert and oriented and he participated appropriately during our telephone visit.  Blood Pressure Weight BMI  BP Readings from Last 3 Encounters:  10/24/18 110/70  06/19/18 113/70  03/27/18 110/70   Wt Readings from Last 3 Encounters:  10/24/18 215 lb (97.5 kg)  10/11/18 216 lb (98 kg)  06/19/18 223 lb (101.2 kg)   BMI Readings from Last 1 Encounters:  10/24/18 27.60 kg/m    *Unable to obtain current vital signs, weight, and BMI due to telephone visit type  Hearing/Vision  . Vi did not seem to have difficulty with hearing/understanding during the telephone conversation . Reports that he has had a formal eye exam by an eye care professional within the past year . Reports that he has not had a formal hearing evaluation within the past year *Unable to fully assess hearing and vision during telephone visit type  Cognitive Function: 6CIT Screen 10/24/2018  What Year? 0 points  What month? 0 points  What time? 0 points  Count back from 20 0 points  Months in reverse 0 points  Repeat phrase 0 points  Total Score 0    Normal Cognitive Function Screening: Yes (Normal:0-7, Significant for Dysfunction: >8)  Immunization & Health Maintenance Record Immunization History  Administered Date(s) Administered  . Influenza,inj,Quad PF,6+ Mos 02/24/2013, 03/28/2014, 03/26/2015, 03/10/2017, 03/26/2018  . Influenza-Unspecified 03/25/2016, 03/26/2018  . Pneumococcal Conjugate-13 11/07/2013  . Pneumococcal Polysaccharide-23  10/16/2012  . Tdap 11/28/2016  . Zoster Recombinat (Shingrix) 10/11/2017, 12/10/2017    Health Maintenance  Topic Date Due  .  INFLUENZA VACCINE  01/11/2019  . COLONOSCOPY  12/29/2020  . TETANUS/TDAP  11/29/2026  . Hepatitis C Screening  Completed  . PNA vac Low Risk Adult  Completed       Assessment  This is a routine wellness examination for FABRIZIO FILIP.  Health Maintenance: Due or Overdue There are no preventive care reminders to display for this patient.  Jordan [name] does not need a referral for Community Assistance: Care Management:   no Social Work:    no Prescription Assistance:  no Nutrition/Diabetes Education:  no   Plan:  Personalized Goals Goals Addressed            This Visit's Progress   . Prevent falls       Stay active  Retirement        Personalized Health Maintenance & Screening Recommendations  up to date  Lung Cancer Screening Recommended: no (Low Dose CT Chest recommended if Age 19-80 years, 30 pack-year currently smoking OR have quit w/in past 15 years) Hepatitis C Screening recommended: no HIV Screening recommended: no  Advanced Directives: Written information was not prepared per patient's request./ copy requested  Referrals & Orders No orders of the defined types were placed in this encounter.   Follow-up Plan . Follow-up with Chipper Herb, MD as planned . Keep follow up with other physicians     I have personally reviewed and noted the following in the patient's chart:   . Medical and social history . Use of alcohol, tobacco or illicit drugs  . Current medications and supplements . Functional ability and status . Nutritional status . Physical activity . Advanced directives . List of other physicians . Hospitalizations, surgeries, and ER visits in previous 12 months . Vitals . Screenings to include cognitive, depression, and falls . Referrals and appointments  In addition, I have reviewed and discussed with Jordan [name] certain preventive protocols, quality metrics, and best practice recommendations. A written personalized care plan  for preventive services as well as general preventive health recommendations is available and can be mailed to the patient at his request.      Zannie Cove, lpn   [id number]

## 2018-11-18 ENCOUNTER — Other Ambulatory Visit: Payer: Self-pay | Admitting: Family Medicine

## 2018-11-18 DIAGNOSIS — I480 Paroxysmal atrial fibrillation: Secondary | ICD-10-CM

## 2018-11-19 ENCOUNTER — Other Ambulatory Visit: Payer: Self-pay

## 2018-11-19 ENCOUNTER — Ambulatory Visit (INDEPENDENT_AMBULATORY_CARE_PROVIDER_SITE_OTHER): Payer: Medicare Other | Admitting: Family Medicine

## 2018-11-19 DIAGNOSIS — Z7901 Long term (current) use of anticoagulants: Secondary | ICD-10-CM

## 2018-11-19 DIAGNOSIS — R351 Nocturia: Secondary | ICD-10-CM

## 2018-11-19 DIAGNOSIS — W57XXXA Bitten or stung by nonvenomous insect and other nonvenomous arthropods, initial encounter: Secondary | ICD-10-CM | POA: Diagnosis not present

## 2018-11-19 DIAGNOSIS — I480 Paroxysmal atrial fibrillation: Secondary | ICD-10-CM

## 2018-11-19 DIAGNOSIS — N401 Enlarged prostate with lower urinary tract symptoms: Secondary | ICD-10-CM

## 2018-11-19 MED ORDER — DOXYCYCLINE HYCLATE 100 MG PO TABS: 200.0000 mg | ORAL_TABLET | Freq: Once | ORAL | 0 refills | Status: AC

## 2018-11-19 NOTE — Progress Notes (Signed)
Telephone visit  Subjective: CC: tick bite PCP: Chipper Herb, MD Jordan [name] is a 73 y.o. male calls for telephone consult today. Patient provides verbal consent for consult held via phone.  Location of patient: home Location of provider: Working remotely from home Others present for call: none  1. Tick bite Patient reports a tick bite on the left leg behind his knee that appeared to be a deer tick.  He removed it this morning but is unsure as to how long it was attached.  He had one on his ankle last week that was not imbedded. No fever, myalgia or target lesions but he does have some redness around the tick bite.    2.  Atrial fibrillation on chronic anticoagulation Patient reports compliance with Eliquis and diltiazem.  He remains essentially asymptomatic.  His cardiologist is retiring.  He has yet to establish with a new one.  He has had no complications with the medications.  He is due for repeat labs and wonders if we can go ahead and place this for him.  3.  BPH.   Patient with longstanding history of BPH.  He has nocturia and increased urine output with certain fluids.  He tries to monitor what and how much fluid he intakes.  He is followed closely by Dr. Jeffie Pollock but notes that he has not had a follow-up visit in a while because of the COVID-19 outbreak.  He has known elevations in prostate-specific antigen and states that he has been treated with Proscar and cool wave therapy in the past for enlarged prostate.  He is discontinued both of these therapies and now is more just watchful waiting.  No plans for pursuing additional therapies at this time.  ROS: Per HPI  Allergies  Allergen Reactions  . Levofloxacin Other (See Comments)    Couldn't breathe, passed out  . Lopid [Gemfibrozil] Other (See Comments)    Loose bowels  . Sulfonamide Derivatives Hives   Past Medical History:  Diagnosis Date  . Anxiety    Mild  . Atrial fibrillation (HCC)    Paroxysmal  . BPH  (benign prostatic hyperplasia)   . CAD (coronary artery disease)    Stent LAD, 2002  /  nuclear 2009, no ischemia  /    [facility] October, 2011 nuclear, no ischemia, possible slight apical scar, ejection fraction 70%  . Carotid bruit    Doppler November, 2011, normal  . Chronic anticoagulation    Low CHADS , score, but patient wants to be aggressive  . Colon polyps   . Drug intolerance    Mild beta blocker intolerance  . Dyslipidemia   . Ejection fraction    EF 65-70%, echo, 2007 /  EF 70%, nuclear, 2011  . Hyperlipidemia   . IBS (irritable bowel syndrome)   . Incomplete RBBB   . Knee pain    left  . Personal history of colonic adenomas 11/29/2006   Qualifier: Diagnosis of  By: [place], [place]    . Syncope    November, 2010    Current Outpatient Medications:  .  atorvastatin (LIPITOR) 40 MG tablet, Take 1 tablet (40 mg total) by mouth daily at 6 PM., Disp: 90 tablet, Rfl: 0 .  Calcium Carb-Cholecalciferol (CALCIUM 600+D3 PO), Take 1 capsule by mouth daily. , Disp: , Rfl:  .  carboxymethylcellulose (REFRESH PLUS) 0.5 % SOLN, Place 1 drop into both eyes 3 (three) times daily as needed. For dry eyes, Disp: , Rfl:  .  Cholecalciferol (VITAMIN D3) 1000 UNITS CAPS, Take 1,000 Units by mouth daily. , Disp: , Rfl:  .  diltiazem (CARDIZEM CD) 180 MG 24 hr capsule, TAKE 1 CAPSULE DAILY, Disp: 90 capsule, Rfl: 0 .  diltiazem (CARDIZEM) 60 MG tablet, Take 1 tablet (60 mg total) by mouth as needed. (Patient not taking: Reported on 10/24/2018), Disp: 90 tablet, Rfl: 3 .  ELIQUIS 5 MG TABS tablet, TAKE 1 TABLET TWICE A DAY, Disp: 60 tablet, Rfl: 0 .  fish oil-omega-3 fatty acids 1000 MG capsule, Take 1 g by mouth daily. , Disp: , Rfl:  .  Glucosamine-Chondroitin 1500-1200 MG/30ML LIQD, Take 1,500 mg by mouth daily. , Disp: , Rfl:  .  Multiple Vitamins-Minerals (MULTIVITAMIN WITH MINERALS) tablet, Take 1 tablet by mouth daily. , Disp: , Rfl:  .  nitroGLYCERIN (NITROSTAT) 0.4 MG SL tablet, Place 1  tablet (0.4 mg total) under the tongue every 5 (five) minutes as needed for chest pain. (Patient not taking: Reported on 10/24/2018), Disp: 25 tablet, Rfl: 3 .  Probiotic Product (ALIGN PO), Take 1 tablet by mouth daily. As needed, Disp: , Rfl:   Assessment/ Plan: 73 y.o. male   1. Tick bite, initial encounter Will proceed with preventative dose - doxycycline (VIBRA-TABS) 100 MG tablet; Take 2 tablets (200 mg total) by mouth once for 1 dose.  Dispense: 2 tablet; Refill: 0  2. Paroxysmal atrial fibrillation (HCC) Doing well overdue for interval labs.  He is on chronic anticoagulation and therefore have added a CMP and CBC.  We discussed that twice yearly checks for lipid is not needed given stability of lipid panel, compliance with Lipitor.  We will plan to recheck this in January. - CMP14+EGFR; Future - CBC; Future  3. Long term (current) use of anticoagulants No adverse side effects from anticoagulant.  Check CBC and CMP.  He will come in for these labs and we have scheduled a follow-up visit at the end of the month for review of labs and to establish care. - CMP14+EGFR; Future - CBC; Future  4. Benign prostatic hyperplasia with nocturia Is followed closely by Dr. Jeffie Pollock but is overdue for PSA check.  We will check this along with labs as above.  Of note, he has chronic elevation in PSA with July 2019 PSA of 10.4. - PSA, total and free; Future  Start time: 3:35pm End time: 3:54pm  Total time spent on patient care (including telephone call/ virtual visit): 27 minutes  [place], [place] [phone]

## 2018-11-19 NOTE — Patient Instructions (Signed)

## 2018-11-29 ENCOUNTER — Other Ambulatory Visit: Payer: Medicare Other

## 2018-11-29 ENCOUNTER — Other Ambulatory Visit: Payer: Self-pay

## 2018-11-29 DIAGNOSIS — N401 Enlarged prostate with lower urinary tract symptoms: Secondary | ICD-10-CM

## 2018-11-29 DIAGNOSIS — I480 Paroxysmal atrial fibrillation: Secondary | ICD-10-CM | POA: Diagnosis not present

## 2018-11-29 DIAGNOSIS — R351 Nocturia: Secondary | ICD-10-CM

## 2018-11-29 DIAGNOSIS — Z7901 Long term (current) use of anticoagulants: Secondary | ICD-10-CM

## 2018-11-30 LAB — CMP14+EGFR
ALT: 27 IU/L (ref 0–44)
AST: 29 IU/L (ref 0–40)
Albumin/Globulin Ratio: 1.5 (ref 1.2–2.2)
Albumin: 4 g/dL (ref 3.7–4.7)
Alkaline Phosphatase: 73 IU/L (ref 39–117)
BUN/Creatinine Ratio: 20 (ref 10–24)
BUN: 21 mg/dL (ref 8–27)
Bilirubin Total: 0.7 mg/dL (ref 0.0–1.2)
CO2: 25 mmol/L (ref 20–29)
Calcium: 9 mg/dL (ref 8.6–10.2)
Chloride: 103 mmol/L (ref 96–106)
Creatinine, Ser: 1.04 mg/dL (ref 0.76–1.27)
GFR calc Af Amer: 83 mL/min/{1.73_m2} (ref >59)
GFR calc non Af Amer: 72 mL/min/{1.73_m2} (ref >59)
Globulin, Total: 2.6 g/dL (ref 1.5–4.5)
Glucose: 89 mg/dL (ref 65–99)
Potassium: 4.4 mmol/L (ref 3.5–5.2)
Sodium: 142 mmol/L (ref 134–144)
Total Protein: 6.6 g/dL (ref 6.0–8.5)

## 2018-11-30 LAB — CBC
Hematocrit: 45.6 % (ref 37.5–51.0)
Hemoglobin: 15.7 g/dL (ref 13.0–17.7)
MCH: 30.3 pg (ref 26.6–33.0)
MCHC: 34.4 g/dL (ref 31.5–35.7)
MCV: 88 fL (ref 79–97)
Platelets: 242 10*3/uL (ref 150–450)
RBC: 5.18 x10E6/uL (ref 4.14–5.80)
RDW: 12.3 % (ref 11.6–15.4)
WBC: 7.8 10*3/uL (ref 3.4–10.8)

## 2018-11-30 LAB — PSA, TOTAL AND FREE
PSA, Free Pct: 19.3 %
PSA, Free: 3.3 ng/mL
Prostate Specific Ag, Serum: 17.1 ng/mL — ABNORMAL HIGH (ref 0.0–4.0)

## 2018-12-03 ENCOUNTER — Other Ambulatory Visit: Payer: Self-pay

## 2018-12-04 ENCOUNTER — Encounter: Payer: Self-pay | Admitting: Family Medicine

## 2018-12-04 ENCOUNTER — Ambulatory Visit (INDEPENDENT_AMBULATORY_CARE_PROVIDER_SITE_OTHER): Payer: Medicare Other | Admitting: Family Medicine

## 2018-12-04 DIAGNOSIS — Z7901 Long term (current) use of anticoagulants: Secondary | ICD-10-CM | POA: Diagnosis not present

## 2018-12-04 DIAGNOSIS — R351 Nocturia: Secondary | ICD-10-CM | POA: Diagnosis not present

## 2018-12-04 DIAGNOSIS — I480 Paroxysmal atrial fibrillation: Secondary | ICD-10-CM

## 2018-12-04 DIAGNOSIS — N401 Enlarged prostate with lower urinary tract symptoms: Secondary | ICD-10-CM

## 2018-12-04 NOTE — Progress Notes (Signed)
Subjective: CC: est care, Afib PCP: [name], DO Jordan [name] is a 73 y.o. male presenting to clinic today for:  1.  Atrial fibrillation Patient reports overall he does very well.  He is taking Eliquis, diltiazem as prescribed.  He does have medical history of cardiac stent placement as well.  He reports compliance with his statin.  He also takes over-the-counter fish oils.  Does not endorse any chest pain, shortness of breath or excessive bleeding.  2.  BPH Patient with known BPH.  He has nocturia.  He has had elevated prostate antigen's in the past and is due for follow-up with Dr. Jeffie [name], his urologist.  Denies any hematuria.  No unplanned weight loss.   ROS: Per HPI  Allergies  Allergen Reactions  . Levofloxacin Other (See Comments)    Couldn't breathe, passed out  . Lopid [Gemfibrozil] Other (See Comments)    Loose bowels  . Sulfonamide Derivatives Hives   Past Medical History:  Diagnosis Date  . Anxiety    Mild  . Atrial fibrillation (HCC)    Paroxysmal  . BPH (benign prostatic hyperplasia)   . CAD (coronary artery disease)    Stent LAD, 2002  /  nuclear 2009, no ischemia  /    [facility] October, 2011 nuclear, no ischemia, possible slight apical scar, ejection fraction 70%  . Carotid bruit    Doppler November, 2011, normal  . Chronic anticoagulation    Low CHADS , score, but patient wants to be aggressive  . Colon polyps   . Drug intolerance    Mild beta blocker intolerance  . Dyslipidemia   . Ejection fraction    EF 65-70%, echo, 2007 /  EF 70%, nuclear, 2011  . Hyperlipidemia   . IBS (irritable bowel syndrome)   . Incomplete RBBB   . Knee pain    left  . Personal history of colonic adenomas 11/29/2006   Qualifier: Diagnosis of  By: [place], [place]    . Syncope    November, 2010    Current Outpatient Medications:  .  atorvastatin (LIPITOR) 40 MG tablet, Take 1 tablet (40 mg total) by mouth daily at 6 PM., Disp: 90 tablet, Rfl: 0 .   Calcium Carb-Cholecalciferol (CALCIUM 600+D3 PO), Take 1 capsule by mouth daily. , Disp: , Rfl:  .  carboxymethylcellulose (REFRESH PLUS) 0.5 % SOLN, Place 1 drop into both eyes 3 (three) times daily as needed. For dry eyes, Disp: , Rfl:  .  Cholecalciferol (VITAMIN D3) 1000 UNITS CAPS, Take 1,000 Units by mouth daily. , Disp: , Rfl:  .  diltiazem (CARDIZEM CD) 180 MG 24 hr capsule, TAKE 1 CAPSULE DAILY, Disp: 90 capsule, Rfl: 0 .  diltiazem (CARDIZEM) 60 MG tablet, Take 1 tablet (60 mg total) by mouth as needed. (Patient not taking: Reported on 10/24/2018), Disp: 90 tablet, Rfl: 3 .  ELIQUIS 5 MG TABS tablet, TAKE 1 TABLET TWICE A DAY, Disp: 60 tablet, Rfl: 0 .  fish oil-omega-3 fatty acids 1000 MG capsule, Take 1 g by mouth daily. , Disp: , Rfl:  .  Glucosamine-Chondroitin 1500-1200 MG/30ML LIQD, Take 1,500 mg by mouth daily. , Disp: , Rfl:  .  Multiple Vitamins-Minerals (MULTIVITAMIN WITH MINERALS) tablet, Take 1 tablet by mouth daily. , Disp: , Rfl:  .  nitroGLYCERIN (NITROSTAT) 0.4 MG SL tablet, Place 1 tablet (0.4 mg total) under the tongue every 5 (five) minutes as needed for chest pain. (Patient not taking: Reported on 10/24/2018), Disp: 25  tablet, Rfl: 3 .  Probiotic Product (ALIGN PO), Take 1 tablet by mouth daily. As needed, Disp: , Rfl:  Social History   Socioeconomic History  . Marital status: Married    Spouse name: [name]   . Number of children: 3  . Years of education: Not on file  . Highest education level: Not on file  Occupational History  . 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 Financial resource strain: Not on file  . Food insecurity    Worry: Not on file    Inability: Not on file  . Transportation needs    Medical: Not on file    Non-medical: Not on file  Tobacco Use  . Smoking status: Never Smoker  . Smokeless tobacco: Never Used  Substance and Sexual Activity  . Alcohol use: No  . Drug use: No  . Sexual activity:  Not on file  Lifestyle  . Physical activity    Days per week: Not on file    Minutes per session: Not on file  . Stress: Not on file  Relationships  . Social Herbalist on phone: Not on file    Gets together: Not on file    Attends religious service: Not on file    Active member of club or organization: Not on file    Attends meetings of clubs or organizations: Not on file    Relationship status: Not on file  . Intimate partner violence    Fear of current or ex partner: Not on file    Emotionally abused: Not on file    Physically abused: Not on file    Forced sexual activity: Not on file  Other Topics Concern  . Not on file  Social History Narrative   Married, he has children and grandchildren.   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 Heart attack Mother   . Diabetes Mother   . Heart attack Brother   . Heart disease Brother   . Stroke Maternal Grandfather   . Stroke Paternal Grandfather   . Macular degeneration Sister   . Cancer Daughter        colon   . Obesity Sister   . Paranoid behavior Sister   . Cancer Sister        lung - uterus   . COPD Sister   . Colon cancer Neg Hx     Objective: Office vital signs reviewed. There were no vitals taken for this visit.  Physical Examination:  General: Awake, alert, well nourished, No acute distress HEENT: Normal, sclera white, MMM Cardio: regular rate and rhythm, S1S2 heard, no murmurs appreciated Pulm: clear to auscultation bilaterally, no wheezes, rhonchi or rales; normal work of breathing on room air Psych: Mood stable, speech normal, affect appropriate, pleasant and interactive  Assessment/ Plan: 73 y.o. male   1. Benign prostatic hyperplasia with nocturia Labs were reviewed in office with the patient today.  He has quite a significant jump in his PSA level since last check.  I have forwarded the results to Dr.  Jeffie [name] and encouraged patient to go ahead and contact his office for an appointment as soon as possible.  2. Paroxysmal atrial fibrillation (HCC) Rate controlled.  Continue current regimen.  3. Long term (current) use of anticoagulants No red flags.  Continue Eliquis.  CBC with  normal hemoglobin.   No orders of the defined types were placed in this encounter.  No orders of the defined types were placed in this encounter.    [name], DO [place] [phone]

## 2018-12-25 ENCOUNTER — Ambulatory Visit: Payer: Medicare Other | Admitting: Family Medicine

## 2019-01-17 ENCOUNTER — Other Ambulatory Visit: Payer: Self-pay | Admitting: *Deleted

## 2019-01-17 DIAGNOSIS — I480 Paroxysmal atrial fibrillation: Secondary | ICD-10-CM

## 2019-01-17 MED ORDER — APIXABAN 5 MG PO TABS: 5.0000 mg | ORAL_TABLET | Freq: Two times a day (BID) | ORAL | 3 refills | Status: DC

## 2019-03-14 ENCOUNTER — Encounter: Payer: Self-pay | Admitting: *Deleted

## 2019-04-09 ENCOUNTER — Other Ambulatory Visit: Payer: Self-pay | Admitting: Urology

## 2019-04-09 DIAGNOSIS — R972 Elevated prostate specific antigen [PSA]: Secondary | ICD-10-CM

## 2019-04-21 ENCOUNTER — Other Ambulatory Visit: Payer: Self-pay | Admitting: Family Medicine

## 2019-04-30 ENCOUNTER — Other Ambulatory Visit: Payer: Self-pay

## 2019-04-30 ENCOUNTER — Ambulatory Visit
Admission: RE | Admit: 2019-04-30 | Discharge: 2019-04-30 | Disposition: A | Discharge status: Home / Self Care | Payer: Medicare Other | Source: Ambulatory Visit | Attending: Urology | Admitting: Urology

## 2019-04-30 DIAGNOSIS — R972 Elevated prostate specific antigen [PSA]: Secondary | ICD-10-CM

## 2019-04-30 MED ORDER — GADOBENATE DIMEGLUMINE 529 MG/ML IV SOLN
20.0000 mL | Freq: Once | INTRAVENOUS | Status: AC | PRN
  Administered 2019-04-30: 20 mL via INTRAVENOUS

## 2019-05-19 ENCOUNTER — Telehealth: Payer: Self-pay | Admitting: *Deleted

## 2019-05-19 NOTE — Telephone Encounter (Signed)
   [facility] Pre-operative Risk Assessment    Request for surgical clearance:  1. What type of surgery is being performed? PROSTATE BX   2. When is this surgery scheduled? TBD   3. What type of clearance is required (medical clearance vs. Pharmacy clearance to hold med vs. Both)? BOTH  4. Are there any medications that need to be held prior to surgery and how long? ELIQUIS X 3 DAYS PRIOR TO BX    5. Practice name and name of physician performing surgery? [facility]; DR. Jenny Reichmann [name]   6. What is your office phone number [phone]    7.   What is your office fax number [phone]  8.   Anesthesia type (None, local, MAC, general) ? NOT LISTED   Jordan [name] 05/19/2019, 8:43 AM  _________________________________________________________________   (provider comments below)

## 2019-05-19 NOTE — Telephone Encounter (Signed)
   Primary Cardiologist: Candee Furbish, MD  Chart reviewed and patient contacted by phone today as part of pre-operative protocol coverage. Given past medical history and time since last visit, based on ACC/AHA guidelines, Jordan [name] would be at acceptable risk for the planned procedure without further cardiovascular testing.   OK to hold Eliquis 3 days pre op if needed.   I will route this recommendation to the requesting party via Epic fax function and remove from pre-op pool.  Please call with questions.  Kerin Ransom, PA-C 05/19/2019, 1:59 PM

## 2019-05-19 NOTE — Telephone Encounter (Signed)
Pt takes Eliquis for afib with CHADS2VASc score of 2 (age, CAD). Renal function and platelet count normal. Ok to hold Eliquis for 3 days prior to procedure per request.

## 2019-05-20 ENCOUNTER — Other Ambulatory Visit: Payer: Self-pay | Admitting: Family Medicine

## 2019-05-23 DIAGNOSIS — R972 Elevated prostate specific antigen [PSA]: Secondary | ICD-10-CM | POA: Insufficient documentation

## 2019-05-27 ENCOUNTER — Other Ambulatory Visit: Payer: Self-pay

## 2019-05-28 ENCOUNTER — Other Ambulatory Visit: Payer: Self-pay

## 2019-05-28 ENCOUNTER — Ambulatory Visit: Payer: Medicare Other | Attending: Internal Medicine

## 2019-05-28 ENCOUNTER — Encounter: Payer: Self-pay | Admitting: Family Medicine

## 2019-05-28 ENCOUNTER — Ambulatory Visit (INDEPENDENT_AMBULATORY_CARE_PROVIDER_SITE_OTHER): Payer: Medicare Other | Admitting: Family Medicine

## 2019-05-28 ENCOUNTER — Other Ambulatory Visit: Payer: Medicare Other

## 2019-05-28 VITALS — Temp 98.9°F

## 2019-05-28 DIAGNOSIS — Z7901 Long term (current) use of anticoagulants: Secondary | ICD-10-CM | POA: Diagnosis not present

## 2019-05-28 DIAGNOSIS — R972 Elevated prostate specific antigen [PSA]: Secondary | ICD-10-CM | POA: Diagnosis not present

## 2019-05-28 DIAGNOSIS — Z20822 Contact with and (suspected) exposure to covid-19: Secondary | ICD-10-CM

## 2019-05-28 DIAGNOSIS — E559 Vitamin D deficiency, unspecified: Secondary | ICD-10-CM

## 2019-05-28 DIAGNOSIS — E782 Mixed hyperlipidemia: Secondary | ICD-10-CM

## 2019-05-28 DIAGNOSIS — I480 Paroxysmal atrial fibrillation: Secondary | ICD-10-CM

## 2019-05-28 DIAGNOSIS — Z20828 Contact with and (suspected) exposure to other viral communicable diseases: Secondary | ICD-10-CM

## 2019-05-28 MED ORDER — BENZONATATE 100 MG PO CAPS: 100.0000 mg | ORAL_CAPSULE | Freq: Three times a day (TID) | ORAL | 0 refills | Status: DC | PRN

## 2019-05-28 MED ORDER — NITROGLYCERIN 0.4 MG SL SUBL: 0.4000 mg | SUBLINGUAL_TABLET | SUBLINGUAL | 3 refills | Status: DC | PRN

## 2019-05-28 MED ORDER — DILTIAZEM HCL 60 MG PO TABS: 60.0000 mg | ORAL_TABLET | ORAL | 3 refills | Status: DC | PRN

## 2019-05-28 MED ORDER — DILTIAZEM HCL ER COATED BEADS 180 MG PO CP24: 180.0000 mg | ORAL_CAPSULE | Freq: Every day | ORAL | 1 refills | Status: DC

## 2019-05-28 MED ORDER — ATORVASTATIN CALCIUM 40 MG PO TABS: ORAL_TABLET | ORAL | 1 refills | Status: DC

## 2019-05-28 MED ORDER — APIXABAN 5 MG PO TABS: 5.0000 mg | ORAL_TABLET | Freq: Two times a day (BID) | ORAL | 3 refills | Status: DC

## 2019-05-28 NOTE — Progress Notes (Signed)
Telephone visit  Subjective: CC: f/u PSA PCP: [name], DO BJY:NWGNFA [place] is a 73 y.o. male calls for telephone consult today. Patient provides verbal consent for consult held via phone.  Due to COVID-19 pandemic this visit was conducted virtually. This visit type was conducted due to national recommendations for restrictions regarding the COVID-19 Pandemic (e.g. social distancing, sheltering in place) in an effort to limit this patient's exposure and mitigate transmission in our community. All issues noted in this document were discussed and addressed.  A physical exam was not performed with this format.   Location of patient: home Location of provider: [facility] Others present for call: none  1. URI Patient reports onset a few days ago.  He reports some chest tightness, myalgia, dry cough, fatigue, diarrhea.  He has testing at [facility] at 1:45pm today.  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  She recently became sick over the weekend and now is positive.  He works closely with her.  He still has his smell and taste. Baseline temp 97.80F, Tmax 98.81F.  He is taking Mucinex 1200 mg and zinc.  He notes that he was sent a kit from his Medicare plan that he believes to be a at home Covid test.  He has not yet reached out to the hotline that was provided but plans to do so.  2. Elevated PSA Patient was evaluated at [facility] medical by urology.  There was concern for possible malignancy of the prostate and they are proceeding with a biopsy soon.  He had a scheduled for Christmas Eve but unfortunately his come down with Covid symptoms as above so he anticipates this will be delayed.  He has been having fatigue and chills as above.  3.  Atrial fibrillation Patient reports that his A. fib has been under good control.  He is not needed the extra 60 mg of Cardizem in a long time but notes that the prescription has expired and will need a renewal on this.  He also needs  renewal on his Eliquis and daily Cardizem.  He reports good control blood pressure with most recent blood pressure of 126/76 at his urology visit.  Does not report any abnormal bleeding.  He has had some chest tightness accompanied by dry cough as above since onset of URI symptoms.   ROS: Per HPI  Allergies  Allergen Reactions  . Levofloxacin Other (See Comments)    Couldn't breathe, passed out  . Lopid [Gemfibrozil] Other (See Comments)    Loose bowels  . Sulfonamide Derivatives Hives   Past Medical History:  Diagnosis Date  . Anxiety    Mild  . Atrial fibrillation (HCC)    Paroxysmal  . BPH (benign prostatic hyperplasia)   . CAD (coronary artery disease)    Stent LAD, 2002  /  nuclear 2009, no ischemia  /    [facility] October, 2011 nuclear, no ischemia, possible slight apical scar, ejection fraction 70%  . Carotid bruit    Doppler November, 2011, normal  . Chest pain 02/23/2013  . Chronic anticoagulation    Low CHADS , score, but patient wants to be aggressive  . Colon polyps   . Drug intolerance    Mild beta blocker intolerance  . Dyslipidemia   . Ejection fraction    EF 65-70%, echo, 2007 /  EF 70%, nuclear, 2011  . Hyperlipidemia   . IBS (irritable bowel syndrome)   . Incomplete RBBB   . Knee pain  left  . Personal history of colonic adenomas 11/29/2006   Qualifier: Diagnosis of  By: Danny Lawless CMA, [place]    . Syncope    November, 2010    Current Outpatient Medications:  .  apixaban (ELIQUIS) 5 MG TABS tablet, Take 1 tablet (5 mg total) by mouth 2 (two) times daily., Disp: 60 tablet, Rfl: 3 .  atorvastatin (LIPITOR) 40 MG tablet, TAKE 1 TABLET DAILY AT 6PM, Disp: 90 tablet, Rfl: 1 .  Calcium Carb-Cholecalciferol (CALCIUM 600+D3 PO), Take 1 capsule by mouth daily. , Disp: , Rfl:  .  carboxymethylcellulose (REFRESH PLUS) 0.5 % SOLN, Place 1 drop into both eyes 3 (three) times daily as needed. For dry eyes, Disp: , Rfl:  .  Cholecalciferol (VITAMIN D3) 1000 UNITS CAPS,  Take 1,000 Units by mouth daily. , Disp: , Rfl:  .  diltiazem (CARDIZEM CD) 180 MG 24 hr capsule, Take 1 capsule (180 mg total) by mouth daily. Needs to be seen for future refills, Disp: 30 capsule, Rfl: 0 .  diltiazem (CARDIZEM) 60 MG tablet, Take 1 tablet (60 mg total) by mouth as needed., Disp: 90 tablet, Rfl: 3 .  fish oil-omega-3 fatty acids 1000 MG capsule, Take 1 g by mouth daily. , Disp: , Rfl:  .  Glucosamine-Chondroitin 1500-1200 MG/30ML LIQD, Take 1,500 mg by mouth daily. , Disp: , Rfl:  .  Multiple Vitamins-Minerals (MULTIVITAMIN WITH MINERALS) tablet, Take 1 tablet by mouth daily. , Disp: , Rfl:  .  nitroGLYCERIN (NITROSTAT) 0.4 MG SL tablet, Place 1 tablet (0.4 mg total) under the tongue every 5 (five) minutes as needed for chest pain. (Patient not taking: Reported on 10/24/2018), Disp: 25 tablet, Rfl: 3 .  Probiotic Product (ALIGN PO), Take 1 tablet by mouth daily. As needed, Disp: , Rfl:   Temperature 98.9 F (37.2 C).  Assessment/ Plan: 73 y.o. male   1. Paroxysmal atrial fibrillation (HCC) Stable.  Does not report any bleeding episodes.  Will place future CBC patient recommended [place] to have labs done.  Currently sheltering at home due to Covid symptoms awaiting testing - apixaban (ELIQUIS) 5 MG TABS tablet; Take 1 tablet (5 mg total) by mouth 2 (two) times daily.  Dispense: 60 tablet; Refill: 3 - CBC; Future  2. Long term (current) use of anticoagulants - CBC; Future  3. Mixed hyperlipidemia We will come in for fasting labs after his acute illness has resolved - Lipid panel; Future - CMP14+EGFR; Future - TSH; Future  4. Elevated PSA Currently being worked up at [facility].  There are plans for biopsy of the prostate soon.  Possible Covid infection may delay biopsy - CBC; Future  5. Close exposure to COVID-19 virus Has plans for testing at 145 today at [facility]  6. Suspected COVID-19 virus infection I have sent in [facility]  for cough.  We discussed red flag signs and symptoms warranting further evaluation.  Recommended proceeding with CDC guidelines for isolation until symptoms have resolved  7. Vitamin D deficiency - Vitamin D 25 hydroxy; Future   Start time: 8:22am End time: 8:43am  Total time spent on patient care (including telephone call/ virtual visit): 26 minutes  [place], [place] [phone]

## 2019-05-28 NOTE — Patient Instructions (Signed)
Prevent the Spread of COVID-19 if You Are Sick If you are sick with COVID-19 or think you might have COVID-19, follow the steps below to help protect other people in your home and community. Stay home except to get medical care.  Stay home. Most people with COVID-19 have mild illness and are able to recover at home without medical care. Do not leave your home, except to get medical care. Do not visit public areas.  Take care of yourself. Get rest and stay hydrated.  Get medical care when needed. Call your doctor before you go to their office for care. But, if you have trouble breathing or other concerning symptoms, call 911 for immediate help.  Avoid public transportation, ride-sharing, or taxis. Separate yourself from other people and pets in your home.  As much as possible, stay in a specific room and away from other people and pets in your home. Also, you should use a separate bathroom, if available. If you need to be around other people or animals in or outside of the home, wear a cloth face covering. ? See COVID-19 and Animals if you have questions about pets: https://www.cdc.gov/coronavirus/2019-ncov/faq.html#COVID19animals Monitor your symptoms.  Common symptoms of COVID-19 include fever and cough. Trouble breathing is a more serious symptom that means you should get medical attention.  Follow care instructions from your healthcare provider and local health department. Your local health authorities will give instructions on checking your symptoms and reporting information. If you develop emergency warning signs for COVID-19 get medical attention immediately.  Emergency warning signs include*:  Trouble breathing  Persistent pain or pressure in the chest  New confusion or not able to be woken  Bluish lips or face *This list is not all inclusive. Please consult your medical provider for any other symptoms that are severe or concerning to you. Call 911 if you have a medical  emergency. If you have a medical emergency and need to call 911, notify the operator that you have or think you might have, COVID-19. If possible, put on a facemask before medical help arrives. Call ahead before visiting your doctor.  Call ahead. Many medical visits for routine care are being postponed or done by phone or telemedicine.  If you have a medical appointment that cannot be postponed, call your doctor's office. This will help the office protect themselves and other patients. If you are sick, wear a cloth covering over your nose and mouth.  You should wear a cloth face covering over your nose and mouth if you must be around other people or animals, including pets (even at home).  You don't need to wear the cloth face covering if you are alone. If you can't put on a cloth face covering (because of trouble breathing for example), cover your coughs and sneezes in some other way. Try to stay at least 6 feet away from other people. This will help protect the people around you. Note: During the COVID-19 pandemic, medical grade facemasks are reserved for healthcare workers and some first responders. You may need to make a cloth face covering using a scarf or bandana. Cover your coughs and sneezes.  Cover your mouth and nose with a tissue when you cough or sneeze.  Throw used tissues in a lined trash can.  Immediately wash your hands with soap and water for at least 20 seconds. If soap and water are not available, clean your hands with an alcohol-based hand sanitizer that contains at least 60% alcohol. Clean your hands often.    Wash your hands often with soap and water for at least 20 seconds. This is especially important after blowing your nose, coughing, or sneezing; going to the bathroom; and before eating or preparing food.  Use hand sanitizer if soap and water are not available. Use an alcohol-based hand sanitizer with at least 60% alcohol, covering all surfaces of your hands and rubbing  them together until they feel dry.  Soap and water are the best option, especially if your hands are visibly dirty.  Avoid touching your eyes, nose, and mouth with unwashed hands. Avoid sharing personal household items.  Do not share dishes, drinking glasses, cups, eating utensils, towels, or bedding with other people in your home.  Wash these items thoroughly after using them with soap and water or put them in the dishwasher. Clean all "high-touch" surfaces everyday.  Clean and disinfect high-touch surfaces in your "sick room" and bathroom. Let someone else clean and disinfect surfaces in common areas, but not your bedroom and bathroom.  If a caregiver or other person needs to clean and disinfect a sick person's bedroom or bathroom, they should do so on an as-needed basis. The caregiver/other person should wear a mask and wait as long as possible after the sick person has used the bathroom. High-touch surfaces include phones, remote controls, counters, tabletops, doorknobs, bathroom fixtures, toilets, keyboards, tablets, and bedside tables.  Clean and disinfect areas that may have blood, stool, or body fluids on them.  Use household cleaners and disinfectants. Clean the area or item with soap and water or another detergent if it is dirty. Then use a household disinfectant. ? Be sure to follow the instructions on the label to ensure safe and effective use of the product. Many products recommend keeping the surface wet for several minutes to ensure germs are killed. Many also recommend precautions such as wearing gloves and making sure you have good ventilation during use of the product. ? Most EPA-registered household disinfectants should be effective. How to discontinue home isolation  People with COVID-19 who have stayed home (home isolated) can stop home isolation under the following conditions: ? If you will not have a test to determine if you are still contagious, you can leave home  after these three things have happened:  You have had no fever for at least 72 hours (that is three full days of no fever without the use of medicine that reduces fevers) AND  other symptoms have improved (for example, when your cough or shortness of breath has improved) AND  at least 10 days have passed since your symptoms first appeared. ? If you will be tested to determine if you are still contagious, you can leave home after these three things have happened:  You no longer have a fever (without the use of medicine that reduces fevers) AND  other symptoms have improved (for example, when your cough or shortness of breath has improved) AND  you received two negative tests in a row, 24 hours apart. Your doctor will follow CDC guidelines. In all cases, follow the guidance of your healthcare provider and local health department. The decision to stop home isolation should be made in consultation with your healthcare provider and state and local health departments. Local decisions depend on local circumstances. cdc.gov/coronavirus 10/13/2018 This information is not intended to replace advice given to you by your health care provider. Make sure you discuss any questions you have with your health care provider. Document Released: 09/24/2018 Document Revised: 10/23/2018 Document Reviewed: 09/24/2018   Elsevier Patient Education  2020 [employer]  

## 2019-05-30 ENCOUNTER — Telehealth: Payer: Self-pay | Admitting: Family Medicine

## 2019-05-30 LAB — NOVEL CORONAVIRUS, NAA: SARS-CoV-2, NAA: DETECTED — AB

## 2019-06-02 ENCOUNTER — Ambulatory Visit: Payer: Medicare Other | Admitting: Family Medicine

## 2019-06-02 NOTE — Telephone Encounter (Signed)
lmtcb

## 2019-06-11 NOTE — Telephone Encounter (Signed)
Attempted to contact patient - No call back - this encounter will be closed.

## 2019-06-20 ENCOUNTER — Ambulatory Visit: Payer: Medicare Other | Admitting: Cardiology

## 2019-06-20 ENCOUNTER — Encounter: Payer: Self-pay | Admitting: Cardiology

## 2019-06-20 ENCOUNTER — Other Ambulatory Visit: Payer: Self-pay

## 2019-06-20 VITALS — BP 112/70 | HR 70 | Ht 74.0 in | Wt 219.0 lb

## 2019-06-20 DIAGNOSIS — I480 Paroxysmal atrial fibrillation: Secondary | ICD-10-CM | POA: Diagnosis not present

## 2019-06-20 DIAGNOSIS — E785 Hyperlipidemia, unspecified: Secondary | ICD-10-CM

## 2019-06-20 DIAGNOSIS — I2510 Atherosclerotic heart disease of native coronary artery without angina pectoris: Secondary | ICD-10-CM

## 2019-06-20 NOTE — Progress Notes (Signed)
Cardiology Office Note:    Date:  06/20/2019   ID:  Jordan [name], DOB 11-04-1945, MRN [id number]  PCP:  Jordan Norlander, DO  Cardiologist:  Jordan Furbish, MD  Electrophysiologist:  None   Referring MD: Jordan Norlander, DO     History of Present Illness:    Jordan [name] is a 74 y.o. male here for follow-up of CAD paroxysmal atrial fibrillation, recent Covid infection.  Had an LAD stent in 2002 with prior nuclear stress test in 2011 showing small possible apical scar.  EF normal.  Sometimes heat bothers him. Varicose vein.  Support hose.   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  Has 7 grandchildren.  Very active.  Takes a grandchildren on a ski trip every year.  [place] last.  Has had GERD in the past.  Perhaps this was with green tea.  COVID -- could come out of quarantine 06/08/19. Fatigue, chronic. On 11/18 had MRI done, prostate cancer. Mind fog now. On 12/11 had uro appt. One of his employees mother had [place]. Had PCR not rapid. Felt skipping palps, diarrhea, no aches. No energy, tinitus is worse. Taste off. Trying to get on eliptical 30 min. Wipes out.   Past Medical History:  Diagnosis Date  . Anxiety    Mild  . Atrial fibrillation (HCC)    Paroxysmal  . BPH (benign prostatic hyperplasia)   . CAD (coronary artery disease)    Stent LAD, 2002  /  nuclear 2009, no ischemia  /    [facility] October, 2011 nuclear, no ischemia, possible slight apical scar, ejection fraction 70%  . Carotid bruit    Doppler November, 2011, normal  . Chest pain 02/23/2013  . Chronic anticoagulation    Low CHADS , score, but patient wants to be aggressive  . Colon polyps   . Drug intolerance    Mild beta blocker intolerance  . Dyslipidemia   . Ejection fraction    EF 65-70%, echo, 2007 /  EF 70%, nuclear, 2011  . Hyperlipidemia   . IBS (irritable bowel syndrome)   . Incomplete RBBB   . Knee pain    left  . Personal history of colonic  adenomas 11/29/2006   Qualifier: Diagnosis of  By: [place], [place]    . Syncope    November, 2010    Past Surgical History:  Procedure Laterality Date  . BACK SURGERY  05/1990   [id number]  . CARDIAC CATHETERIZATION    . COLONOSCOPY  multiple    Current Medications: Current Meds  Medication Sig  . apixaban (ELIQUIS) 5 MG TABS tablet Take 1 tablet (5 mg total) by mouth 2 (two) times daily.  Marland Kitchen atorvastatin (LIPITOR) 40 MG tablet TAKE 1 TABLET DAILY AT 6PM  . benzonatate (TESSALON PERLES) 100 MG capsule Take 1 capsule (100 mg total) by mouth 3 (three) times daily as needed.  . Calcium Carb-Cholecalciferol (CALCIUM 600+D3 PO) Take 1 capsule by mouth daily.   . carboxymethylcellulose (REFRESH PLUS) 0.5 % SOLN Place 1 drop into both eyes 3 (three) times daily as needed. For dry eyes  . Cholecalciferol (VITAMIN D3) 1000 UNITS CAPS Take 1,000 Units by mouth daily.   Marland Kitchen diltiazem (CARDIZEM CD) 180 MG 24 hr capsule Take 1 capsule (180 mg total) by mouth daily.  Marland Kitchen diltiazem (CARDIZEM) 60 MG tablet Take 1 tablet (60 mg total) by mouth as needed.  . fish oil-omega-3 fatty acids 1000 MG capsule  Take 1 g by mouth daily.   . Glucosamine-Chondroitin 1500-1200 MG/30ML LIQD Take 1,500 mg by mouth daily.   . Multiple Vitamins-Minerals (MULTIVITAMIN WITH MINERALS) tablet Take 1 tablet by mouth daily.   . nitroGLYCERIN (NITROSTAT) 0.4 MG SL tablet Place 1 tablet (0.4 mg total) under the tongue every 5 (five) minutes as needed for chest pain.  . Probiotic Product (ALIGN PO) Take 1 tablet by mouth daily. As needed     Allergies:   Levofloxacin, Lopid [gemfibrozil], and Sulfonamide derivatives   Social History   Socioeconomic History  . Marital status: Married    Spouse name: Jordan [name]   . Number of children: 3  . Years of education: Not on file  . Highest education level: Not on file  Occupational History  . Occupation: Dental Laboratory    Comment: Makes dental appliances  Tobacco Use  . Smoking  status: Never Smoker  . Smokeless tobacco: Never Used  Substance and Sexual Activity  . Alcohol use: No  . Drug use: No  . Sexual activity: Not on file  Other Topics Concern  . Not on file  Social History Narrative   Married, he has children and grandchildren.   [place], raises chickens, works as a Neurosurgeon    11/23/2015   Social Determinants of Health   Financial Resource Strain:   . Difficulty of Paying Living Expenses: Not on file  Food Insecurity:   . Worried About Charity fundraiser in the Last Year: Not on file  . Ran Out of Food in the Last Year: Not on file  Transportation Needs:   . Lack of Transportation (Medical): Not on file  . Lack of Transportation (Non-Medical): Not on file  Physical Activity:   . Days of Exercise per Week: Not on file  . Minutes of Exercise per Session: Not on file  Stress:   . Feeling of Stress : Not on file  Social Connections:   . Frequency of Communication with Friends and Family: Not on file  . Frequency of Social Gatherings with Friends and Family: Not on file  . Attends Religious Services: Not on file  . Active Member of Clubs or Organizations: Not on file  . Attends Archivist Meetings: Not on file  . Marital Status: Not on file     Family History: The patient's family history includes COPD in his sister; Cancer in his daughter and sister; Diabetes in his mother; Heart attack in his brother and mother; Heart disease in his brother; Macular degeneration in his sister; Obesity in his sister; Paranoid behavior in his sister; Stroke in his maternal grandfather and paternal grandfather. There is no history of Colon cancer.  ROS:   Please see the history of present illness.     All other systems reviewed and are negative.  EKGs/Labs/Other Studies Reviewed:    The following studies were reviewed today: Prior office notes, lab work, EKGs  Exercise treadmill test 10/05/2015 was low risk, 9 minutes of  exercise.  EKG:  EKG is  ordered today.  The ekg ordered today demonstrates 06/20/2019-sinus rhythm 70. personally interpreted and reviewed normal sinus rhythm 61 with no other abnormal findings, no change when compared to prior  Recent Labs: 11/29/2018: ALT 27; BUN 21; Creatinine, Ser 1.04; Hemoglobin 15.7; Platelets 242; Potassium 4.4; Sodium 142  Recent Lipid Panel    Component Value Date/Time   CHOL 145 06/19/2018 1159   CHOL 125 03/06/2013 0821   TRIG 88 06/19/2018  1159   TRIG 95 05/28/2017 1247   TRIG 73 03/06/2013 0821   HDL 38 (L) 06/19/2018 1159   HDL 35 (L) 05/28/2017 1247   HDL 40 03/06/2013 0821   CHOLHDL 3.8 06/19/2018 1159   CHOLHDL 2.8 02/24/2013 0900   VLDL 16 02/24/2013 0900   LDLCALC 89 06/19/2018 1159   LDLCALC 68 11/18/2013 0804   LDLCALC 70 03/06/2013 0821    Physical Exam:    VS:  BP 112/70   Pulse 70   Ht 6\' 2"  (1.88 m)   Wt 219 lb (99.3 kg)   SpO2 97%   BMI 28.12 kg/m     Wt Readings from Last 3 Encounters:  06/20/19 219 lb (99.3 kg)  10/24/18 215 lb (97.5 kg)  10/11/18 216 lb (98 kg)     GEN:  Well nourished, well developed in no acute distress HEENT: Normal NECK: No JVD; No carotid bruits LYMPHATICS: No lymphadenopathy CARDIAC: RRR, no murmurs, rubs, gallops RESPIRATORY:  Clear to auscultation without rales, wheezing or rhonchi  ABDOMEN: Soft, non-tender, non-distended, mildly protuberant MUSCULOSKELETAL:  No edema; Varicose, TED hose. No deformity  SKIN: Warm and dry NEUROLOGIC:  Alert and oriented x 3 PSYCHIATRIC:  Normal affect   ASSESSMENT:    1. Coronary artery disease involving native coronary artery of native heart without angina pectoris   2. Paroxysmal atrial fibrillation (HCC)   3. hyperlipidemia    PLAN:    In order of problems listed above:  Prior COVID-19 infection -He was positive on 05/28/2019.  Fatigue, lethargy.  Continue to slowly increase exercise efforts.  Paroxysmal atrial fibrillation - Continue with  apixaban.  No bleeding.  Very rare episodes.  CHADSVASc score is 2. -Has diltiazem CD 180 mg once a day.  Also has diltiazem 60 mg as needed.  Doing very well in sinus rhythm currently.  Coronary artery disease status post LAD stent 2002 - ETT 2017 low risk 9 minutes.  Excellent.  Prior to that nuclear stress test 2011 was low risk with small apical defect. - Continue with Eliquis, no aspirin.  No changes made, doing well.  No anginal symptoms.  Continue to slowly increase exercise.  Chronic anticoagulation - In the past have discussed stopping fish oil because of potential bleeding side effects with Eliquis.  Creatinine 1.0, hemoglobin 14.9  Carotid bruit -Prior normal Doppler 2011, difficult to appreciate on exam.  Hyperlipidemia, mixed - 40 mg atorvastatin.  Previously triglycerides 100 LDL 67 HDL 34 total cholesterol 121, however most recent LDL was 82.  If his LDL is not below 70, I would encourage to increase atorvastatin to 80 mg once a day   1 yr fu   Medication Adjustments/Labs and Tests Ordered: Current medicines are reviewed at length with the patient today.  Concerns regarding medicines are outlined above.  Orders Placed This Encounter  Procedures  . EKG 12-Lead   No orders of the defined types were placed in this encounter.   Patient Instructions  Medication Instructions:  The current medical regimen is effective;  continue present plan and medications.  *If you need a refill on your cardiac medications before your next appointment, please call your pharmacy*  Follow-Up: At [facility], you and your health needs are our priority.  As part of our continuing mission to provide you with exceptional heart care, we have created designated Provider Care Teams.  These Care Teams include your primary Cardiologist (physician) and Advanced Practice Providers (APPs -  Physician Assistants and Nurse Practitioners) who  all work together to provide you with the care you need,  when you need it.  Your next appointment:   12 month(s)  The format for your next appointment:   In Person  Provider:   Candee Furbish, MD  Thank you for choosing [facility]!!        Signed, Jordan Furbish, MD  06/20/2019 11:02 AM    [place]

## 2019-06-20 NOTE — Patient Instructions (Signed)
Medication Instructions:  The current medical regimen is effective;  continue present plan and medications.  *If you need a refill on your cardiac medications before your next appointment, please call your pharmacy*  Follow-Up: At [facility], you and your health needs are our priority.  As part of our continuing mission to provide you with exceptional heart care, we have created designated Provider Care Teams.  These Care Teams include your primary Cardiologist (physician) and Advanced Practice Providers (APPs -  Physician Assistants and Nurse Practitioners) who all work together to provide you with the care you need, when you need it.  Your next appointment:   12 month(s)  The format for your next appointment:   In Person  Provider:   Mark Skains, MD   Thank you for choosing [facility]!!     

## 2019-06-25 ENCOUNTER — Other Ambulatory Visit: Payer: Self-pay | Admitting: Family Medicine

## 2019-06-25 DIAGNOSIS — R972 Elevated prostate specific antigen [PSA]: Secondary | ICD-10-CM

## 2019-06-25 NOTE — Progress Notes (Signed)
Has appt w/ [facility] next week for consult for biopsy of prostate.  Needs PSA with other labs tomorrow.

## 2019-06-26 ENCOUNTER — Other Ambulatory Visit: Payer: Medicare Other

## 2019-06-26 ENCOUNTER — Other Ambulatory Visit: Payer: Self-pay

## 2019-06-26 DIAGNOSIS — E559 Vitamin D deficiency, unspecified: Secondary | ICD-10-CM

## 2019-06-26 DIAGNOSIS — Z7901 Long term (current) use of anticoagulants: Secondary | ICD-10-CM | POA: Diagnosis not present

## 2019-06-26 DIAGNOSIS — R972 Elevated prostate specific antigen [PSA]: Secondary | ICD-10-CM

## 2019-06-26 DIAGNOSIS — I480 Paroxysmal atrial fibrillation: Secondary | ICD-10-CM

## 2019-06-26 DIAGNOSIS — E782 Mixed hyperlipidemia: Secondary | ICD-10-CM

## 2019-06-27 ENCOUNTER — Other Ambulatory Visit: Payer: Self-pay | Admitting: Family Medicine

## 2019-06-27 ENCOUNTER — Other Ambulatory Visit: Payer: Self-pay | Admitting: *Deleted

## 2019-06-27 DIAGNOSIS — E782 Mixed hyperlipidemia: Secondary | ICD-10-CM

## 2019-06-27 LAB — CBC
Hematocrit: 46.6 % (ref 37.5–51.0)
Hemoglobin: 15.7 g/dL (ref 13.0–17.7)
MCH: 30 pg (ref 26.6–33.0)
MCHC: 33.7 g/dL (ref 31.5–35.7)
MCV: 89 fL (ref 79–97)
Platelets: 233 10*3/uL (ref 150–450)
RBC: 5.24 x10E6/uL (ref 4.14–5.80)
RDW: 12.2 % (ref 11.6–15.4)
WBC: 7.1 10*3/uL (ref 3.4–10.8)

## 2019-06-27 LAB — CMP14+EGFR
ALT: 22 IU/L (ref 0–44)
AST: 27 IU/L (ref 0–40)
Albumin/Globulin Ratio: 1.6 (ref 1.2–2.2)
Albumin: 4 g/dL (ref 3.7–4.7)
Alkaline Phosphatase: 74 IU/L (ref 39–117)
BUN/Creatinine Ratio: 19 (ref 10–24)
BUN: 20 mg/dL (ref 8–27)
Bilirubin Total: 0.7 mg/dL (ref 0.0–1.2)
CO2: 26 mmol/L (ref 20–29)
Calcium: 9 mg/dL (ref 8.6–10.2)
Chloride: 100 mmol/L (ref 96–106)
Creatinine, Ser: 1.05 mg/dL (ref 0.76–1.27)
GFR calc Af Amer: 82 mL/min/{1.73_m2} (ref >59)
GFR calc non Af Amer: 71 mL/min/{1.73_m2} (ref >59)
Globulin, Total: 2.5 g/dL (ref 1.5–4.5)
Glucose: 85 mg/dL (ref 65–99)
Potassium: 3.9 mmol/L (ref 3.5–5.2)
Sodium: 138 mmol/L (ref 134–144)
Total Protein: 6.5 g/dL (ref 6.0–8.5)

## 2019-06-27 LAB — LIPID PANEL
Chol/HDL Ratio: 3.8 ratio (ref 0.0–5.0)
Cholesterol, Total: 139 mg/dL (ref 100–199)
HDL: 37 mg/dL — ABNORMAL LOW (ref >39)
LDL Chol Calc (NIH): 85 mg/dL (ref 0–99)
Triglycerides: 87 mg/dL (ref 0–149)
VLDL Cholesterol Cal: 17 mg/dL (ref 5–40)

## 2019-06-27 LAB — VITAMIN D 25 HYDROXY (VIT D DEFICIENCY, FRACTURES): Vit D, 25-Hydroxy: 58.1 ng/mL (ref 30.0–100.0)

## 2019-06-27 LAB — TSH: TSH: 4.04 u[IU]/mL (ref 0.450–4.500)

## 2019-06-27 LAB — PSA: Prostate Specific Ag, Serum: 17.6 ng/mL — ABNORMAL HIGH (ref 0.0–4.0)

## 2019-06-27 MED ORDER — ATORVASTATIN CALCIUM 80 MG PO TABS: ORAL_TABLET | ORAL | 0 refills | Status: DC

## 2019-06-27 MED ORDER — DILTIAZEM HCL 60 MG PO TABS: 60.0000 mg | ORAL_TABLET | ORAL | 1 refills | Status: DC | PRN

## 2019-07-28 DIAGNOSIS — Z01812 Encounter for preprocedural laboratory examination: Secondary | ICD-10-CM | POA: Diagnosis not present

## 2019-08-08 DIAGNOSIS — N411 Chronic prostatitis: Secondary | ICD-10-CM | POA: Diagnosis not present

## 2019-09-05 ENCOUNTER — Other Ambulatory Visit: Payer: Self-pay

## 2019-09-05 ENCOUNTER — Other Ambulatory Visit: Payer: Medicare Other

## 2019-09-05 DIAGNOSIS — E782 Mixed hyperlipidemia: Secondary | ICD-10-CM | POA: Diagnosis not present

## 2019-09-05 LAB — LIPID PANEL
Chol/HDL Ratio: 3.4 ratio (ref 0.0–5.0)
Cholesterol, Total: 124 mg/dL (ref 100–199)
HDL: 37 mg/dL — ABNORMAL LOW (ref >39)
LDL Chol Calc (NIH): 74 mg/dL (ref 0–99)
Triglycerides: 63 mg/dL (ref 0–149)
VLDL Cholesterol Cal: 13 mg/dL (ref 5–40)

## 2019-09-05 LAB — CMP14+EGFR
ALT: 23 IU/L (ref 0–44)
AST: 27 IU/L (ref 0–40)
Albumin/Globulin Ratio: 1.6 (ref 1.2–2.2)
Albumin: 3.9 g/dL (ref 3.7–4.7)
Alkaline Phosphatase: 67 IU/L (ref 39–117)
BUN/Creatinine Ratio: 19 (ref 10–24)
BUN: 20 mg/dL (ref 8–27)
Bilirubin Total: 0.5 mg/dL (ref 0.0–1.2)
CO2: 25 mmol/L (ref 20–29)
Calcium: 8.7 mg/dL (ref 8.6–10.2)
Chloride: 104 mmol/L (ref 96–106)
Creatinine, Ser: 1.04 mg/dL (ref 0.76–1.27)
GFR calc Af Amer: 83 mL/min/{1.73_m2} (ref >59)
GFR calc non Af Amer: 71 mL/min/{1.73_m2} (ref >59)
Globulin, Total: 2.4 g/dL (ref 1.5–4.5)
Glucose: 85 mg/dL (ref 65–99)
Potassium: 4.3 mmol/L (ref 3.5–5.2)
Sodium: 141 mmol/L (ref 134–144)
Total Protein: 6.3 g/dL (ref 6.0–8.5)

## 2019-09-09 ENCOUNTER — Other Ambulatory Visit: Payer: Self-pay | Admitting: Family Medicine

## 2019-09-09 MED ORDER — ATORVASTATIN CALCIUM 80 MG PO TABS: ORAL_TABLET | ORAL | 3 refills | Status: DC

## 2019-10-13 ENCOUNTER — Other Ambulatory Visit: Payer: Self-pay | Admitting: Family Medicine

## 2019-10-13 ENCOUNTER — Telehealth: Payer: Self-pay | Admitting: Family Medicine

## 2019-10-13 DIAGNOSIS — H9193 Unspecified hearing loss, bilateral: Secondary | ICD-10-CM

## 2019-10-13 NOTE — Telephone Encounter (Signed)
Patient aware and verbalizes understanding. 

## 2019-10-13 NOTE — Telephone Encounter (Signed)
  REFERRAL REQUEST Telephone Note 10/13/2019  What type of referral do you need? Hearing test at [facility] ( Dr Hildred Alamin [place]) [phone]  Have you been seen at our office for this problem? yes (Advise that they may need an appointment with their PCP before a referral can be done)  Is there a particular doctor or location that you prefer? Dr Hildred Alamin [place]  Patient notified that referrals can take up to a week or longer to process. If they haven't heard anything within a week they should call back and speak with the referral department.

## 2019-10-13 NOTE — Telephone Encounter (Signed)
done

## 2019-10-14 ENCOUNTER — Other Ambulatory Visit: Payer: Self-pay | Admitting: Family Medicine

## 2019-10-14 DIAGNOSIS — I480 Paroxysmal atrial fibrillation: Secondary | ICD-10-CM

## 2019-10-31 DIAGNOSIS — H93A3 Pulsatile tinnitus, bilateral: Secondary | ICD-10-CM | POA: Diagnosis not present

## 2019-10-31 DIAGNOSIS — Z01118 Encounter for examination of ears and hearing with other abnormal findings: Secondary | ICD-10-CM | POA: Diagnosis not present

## 2019-10-31 DIAGNOSIS — H903 Sensorineural hearing loss, bilateral: Secondary | ICD-10-CM | POA: Diagnosis not present

## 2019-11-04 ENCOUNTER — Other Ambulatory Visit: Payer: Self-pay | Admitting: Family Medicine

## 2019-11-04 DIAGNOSIS — I480 Paroxysmal atrial fibrillation: Secondary | ICD-10-CM

## 2019-11-05 ENCOUNTER — Other Ambulatory Visit: Payer: Self-pay | Admitting: *Deleted

## 2019-11-05 DIAGNOSIS — I480 Paroxysmal atrial fibrillation: Secondary | ICD-10-CM

## 2019-11-05 MED ORDER — APIXABAN 5 MG PO TABS: 5.0000 mg | ORAL_TABLET | Freq: Two times a day (BID) | ORAL | 0 refills | Status: DC

## 2019-11-18 ENCOUNTER — Ambulatory Visit (INDEPENDENT_AMBULATORY_CARE_PROVIDER_SITE_OTHER): Payer: Medicare Other | Admitting: *Deleted

## 2019-11-18 DIAGNOSIS — Z0000 Encounter for general adult medical examination without abnormal findings: Secondary | ICD-10-CM | POA: Diagnosis not present

## 2019-11-18 DIAGNOSIS — H9313 Tinnitus, bilateral: Secondary | ICD-10-CM | POA: Diagnosis not present

## 2019-11-18 NOTE — Progress Notes (Signed)
MEDICARE ANNUAL WELLNESS VISIT  11/18/2019  Telephone Visit Disclaimer This Medicare AWV was conducted by telephone due to national recommendations for restrictions regarding the COVID-19 Pandemic (e.g. social distancing).  I verified, using two identifiers, that I am speaking with Jordan [name] or their authorized healthcare agent. I discussed the limitations, risks, security, and privacy concerns of performing an evaluation and management service by telephone and the potential availability of an in-person appointment in the future. The patient expressed understanding and agreed to proceed.   Subjective:  Jordan [name] is a 74 y.o. male patient of [name], DO who had a Medicare Annual Wellness Visit today via telephone. Jordan [name] is Retired and lives with their spouse. he has 3 children. he reports that he is socially active and does interact with friends/family regularly. he is moderately physically active and enjoys traveling.  Patient Care Team: [name], DO as PCP - General (Family Medicine) Jerline Pain, MD as PCP - Cardiology (Cardiology) Gatha Mayer, MD (Gastroenterology) Jarome Matin, MD as Consulting Physician (Dermatology) Chancy Milroy, [place] (Urology)  Advanced Directives 11/18/2019 10/24/2018 03/05/2017 09/22/2015 03/22/2015 02/23/2013  Does Patient Have a Medical Advance Directive? Yes Yes Yes Yes;No Yes Patient has advance directive, copy not in chart  Type of Advance Directive [place];Living will [place];Living will [place];Living will - [place];Living will [place];Living will  Does patient want to make changes to medical advance directive? No - Patient declined No - Patient declined No - Patient declined No - Patient declined - -  Copy of [place] in Chart? No - copy requested No - copy requested No - copy  requested - - Copy requested from family  Pre-existing out of facility DNR order (yellow form or pink MOST form) - - - - - No    Hospital Utilization Over the Past 12 Months: # of hospitalizations or ER visits: 1 # of surgeries: 0  Review of Systems    Patient reports that his overall health is unchanged compared to last year.  History obtained from chart review and the patient  Patient Reported Readings (BP, Pulse, CBG, Weight, etc) none  Pain Assessment Pain : No/denies pain     Current Medications & Allergies (verified) Allergies as of 11/18/2019      Reactions   Levofloxacin Other (See Comments)   Couldn't breathe, passed out   Lopid [gemfibrozil] Other (See Comments)   Loose bowels   Sulfonamide Derivatives Hives      Medication List       Accurate as of November 18, 2019  3:04 PM. If you have any questions, ask your nurse or doctor.        STOP taking these medications   benzonatate 100 MG capsule Commonly known as: Tessalon Perles     TAKE these medications   ALIGN PO Take 1 tablet by mouth daily. As needed   apixaban 5 MG Tabs tablet Commonly known as: Eliquis Take 1 tablet (5 mg total) by mouth 2 (two) times daily.   atorvastatin 80 MG tablet Commonly known as: LIPITOR TAKE 1 TABLET DAILY AT 6PM   CALCIUM 600+D3 PO Take 1 capsule by mouth daily.   carboxymethylcellulose 0.5 % Soln Commonly known as: REFRESH PLUS Place 1 drop into both eyes 3 (three) times daily as needed. For dry eyes   diltiazem 180 MG 24 hr capsule Commonly known as:  CARDIZEM CD Take 1 capsule (180 mg total) by mouth daily.   diltiazem 60 MG tablet Commonly known as: CARDIZEM Take 1 tablet (60 mg total) by mouth as needed.   finasteride 5 MG tablet Commonly known as: PROSCAR Take by mouth.   fish oil-omega-3 fatty acids 1000 MG capsule Take 1 g by mouth daily.   Glucosamine-Chondroitin 1500-1200 MG/30ML Liqd Take 1,500 mg by mouth daily.   multivitamin with minerals  tablet Take 1 tablet by mouth daily.   nitroGLYCERIN 0.4 MG SL tablet Commonly known as: NITROSTAT Place 1 tablet (0.4 mg total) under the tongue every 5 (five) minutes as needed for chest pain.   Vitamin D3 25 MCG (1000 UT) Caps Take 1,000 Units by mouth daily.       History (reviewed): Past Medical History:  Diagnosis Date  . Anxiety    Mild  . Atrial fibrillation (HCC)    Paroxysmal  . BPH (benign prostatic hyperplasia)   . CAD (coronary artery disease)    Stent LAD, 2002  /  nuclear 2009, no ischemia  /    [facility] October, 2011 nuclear, no ischemia, possible slight apical scar, ejection fraction 70%  . Carotid bruit    Doppler November, 2011, normal  . Chest pain 02/23/2013  . Chronic anticoagulation    Low CHADS , score, but patient wants to be aggressive  . Colon polyps   . Drug intolerance    Mild beta blocker intolerance  . Dyslipidemia   . Ejection fraction    EF 65-70%, echo, 2007 /  EF 70%, nuclear, 2011  . Hyperlipidemia   . IBS (irritable bowel syndrome)   . Incomplete RBBB   . Knee pain    left  . Personal history of colonic adenomas 11/29/2006   Qualifier: Diagnosis of  By: [place], [place]    . Syncope    November, 2010   Past Surgical History:  Procedure Laterality Date  . BACK SURGERY  05/1990   [id number]  . CARDIAC CATHETERIZATION    . COLONOSCOPY  multiple   Family History  Problem Relation Age of Onset  . Heart attack Mother   . Diabetes Mother   . Heart attack Brother   . Heart disease Brother   . Stroke Maternal Grandfather   . Stroke Paternal Grandfather   . Macular degeneration Sister   . Cancer Daughter        colon   . Obesity Sister   . Paranoid behavior Sister   . Cancer Sister        lung - uterus   . COPD Sister   . Colon cancer Neg Hx    Social History   Socioeconomic History  . Marital status: Married    Spouse name: Jordan [name]   . Number of children: 3  . Years of education: bachelors  . Highest education level:  Not on file  Occupational History  . Occupation: Dental Laboratory    Comment: Makes dental appliances  Tobacco Use  . Smoking status: Never Smoker  . Smokeless tobacco: Never Used  Substance and Sexual Activity  . Alcohol use: No  . Drug use: No  . Sexual activity: Not on file  Other Topics Concern  . Not on file  Social History Narrative   Married, he has children and grandchildren.   L[place] [address] acres, raises chickens, works as a Neurosurgeon    11/23/2015   Social Determinants of Health   Financial Resource Strain:   .  Difficulty of Paying Living Expenses:   Food Insecurity:   . Worried About Charity fundraiser in the Last Year:   . Arboriculturist in the Last Year:   Transportation Needs:   . Film/video editor (Medical):   Marland Kitchen Lack of Transportation (Non-Medical):   Physical Activity:   . Days of Exercise per Week:   . Minutes of Exercise per Session:   Stress:   . Feeling of Stress :   Social Connections:   . Frequency of Communication with Friends and Family:   . Frequency of Social Gatherings with Friends and Family:   . Attends Religious Services:   . Active Member of Clubs or Organizations:   . Attends Archivist Meetings:   Marland Kitchen Marital Status:     Activities of Daily Living In your present state of health, do you have any difficulty performing the following activities: 11/18/2019  Hearing? N  Vision? Y  Comment eye glasses  Difficulty concentrating or making decisions? N  Walking or climbing stairs? N  Dressing or bathing? N  Doing errands, shopping? N  Preparing Food and eating ? N  Using the Toilet? N  In the past six months, have you accidently leaked urine? N  Do you have problems with loss of bowel control? N  Managing your Medications? N  Managing your Finances? N  Housekeeping or managing your Housekeeping? N  Some recent data might be hidden    Patient Education/ Literacy How often do you need to have someone help you when  you read instructions, pamphlets, or other written materials from your doctor or pharmacy?: 1 - Never What is the last grade level you completed in school?: bachelors  Exercise Current Exercise Habits: Home exercise routine, Type of exercise: stretching(eliptical), Time (Minutes): 40, Frequency (Times/Week): 5, Weekly Exercise (Minutes/Week): 200, Intensity: Moderate, Exercise limited by: None identified  Diet Patient reports consuming 3 meals a day and 0 snack(s) a day Patient reports that his primary diet is: Regular Patient reports that she does have regular access to food.   Depression Screen PHQ 2/9 Scores 11/18/2019 12/04/2018 10/24/2018 06/19/2018 12/10/2017 05/28/2017 11/28/2016  PHQ - 2 Score 0 0 0 0 0 0 0  PHQ- 9 Score - 0 - - - - -     Fall Risk Fall Risk  11/18/2019 11/18/2019 12/04/2018 10/24/2018 06/19/2018  Falls in the past year? 0 0 0 0 0     Objective:  Jordan [name] seemed alert and oriented and he participated appropriately during our telephone visit.  Blood Pressure Weight BMI  BP Readings from Last 3 Encounters:  06/20/19 112/70  10/24/18 110/70  06/19/18 113/70   Wt Readings from Last 3 Encounters:  06/20/19 219 lb (99.3 kg)  10/24/18 215 lb (97.5 kg)  10/11/18 216 lb (98 kg)   BMI Readings from Last 1 Encounters:  06/20/19 28.12 kg/m    *Unable to obtain current vital signs, weight, and BMI due to telephone visit type  Hearing/Vision  . Kaydence did not seem to have difficulty with hearing/understanding during the telephone conversation . Reports that he has had a formal eye exam by an eye care professional within the past year . Reports that he has had a formal hearing evaluation within the past year *Unable to fully assess hearing and vision during telephone visit type  Cognitive Function: 6CIT Screen 11/18/2019 11/18/2019 10/24/2018  What Year? 0 points 0 points 0 points  What month? 0 points 0 points  0 points  What time? 0 points - 0 points  Count back from 20  - - 0 points  Months in reverse 0 points - 0 points  Repeat phrase 0 points - 0 points  Total Score - - 0   (Normal:0-7, Significant for Dysfunction: >8)  Normal Cognitive Function Screening: Yes   Immunization & Health Maintenance Record Immunization History  Administered Date(s) Administered  . Influenza,inj,Quad PF,6+ Mos 02/24/2013, 03/28/2014, 03/26/2015, 03/10/2017, 03/26/2018  . Influenza-Unspecified 03/25/2016, 03/26/2018, 03/13/2019  . Pneumococcal Conjugate-13 11/07/2013  . Pneumococcal Polysaccharide-23 10/16/2012  . Tdap 11/28/2016  . Zoster Recombinat (Shingrix) 10/11/2017, 12/10/2017    Health Maintenance  Topic Date Due  . COVID-19 Vaccine (1) Never done  . INFLUENZA VACCINE  01/11/2020  . COLONOSCOPY  12/29/2020  . TETANUS/TDAP  11/29/2026  . Hepatitis C Screening  Completed  . PNA vac Low Risk Adult  Completed       Assessment  This is a routine wellness examination for Jordan [name].  Health Maintenance: Due or Overdue Health Maintenance Due  Topic Date Due  . COVID-19 Vaccine (1) Never done    Jordan [name] does not need a referral for Community Assistance: Care Management:   no Social Work:    no Prescription Assistance:  no Nutrition/Diabetes Education:  no   Plan:  Personalized Goals Goals Addressed            This Visit's Progress   . Prevent falls   On track    Stay active  [place] Maintenance & Screening Recommendations  up to date  Lung Cancer Screening Recommended: no (Low Dose CT Chest recommended if Age 96-80 years, 30 pack-year currently smoking OR have quit w/in past 15 years) Hepatitis C Screening recommended: no HIV Screening recommended: no  Advanced Directives: Written information was not prepared per patient's request.  Referrals & Orders No orders of the defined types were placed in this encounter.   Follow-up Plan . Follow-up with [name], DO as planned on  12/22/19 . Health maintenance is up to date . Pt is independent in all ADL's and continues to be very active in exercise. . Voices no healthcare concerns . AVS printed and mailed to pt   I have personally reviewed and noted the following in the patient's chart:   . Medical and social history . Use of alcohol, tobacco or illicit drugs  . Current medications and supplements . Functional ability and status . Nutritional status . Physical activity . Advanced directives . List of other physicians . Hospitalizations, surgeries, and ER visits in previous 12 months . Vitals . Screenings to include cognitive, depression, and falls . Referrals and appointments  In addition, I have reviewed and discussed with Jordan [name] certain preventive protocols, quality metrics, and best practice recommendations. A written personalized care plan for preventive services as well as general preventive health recommendations is available and can be mailed to the patient at his request.      Rana Snare, LPN  07/14/1939

## 2019-12-22 ENCOUNTER — Ambulatory Visit (INDEPENDENT_AMBULATORY_CARE_PROVIDER_SITE_OTHER): Payer: Medicare Other | Admitting: Family Medicine

## 2019-12-22 ENCOUNTER — Other Ambulatory Visit: Payer: Self-pay

## 2019-12-22 ENCOUNTER — Encounter: Payer: Self-pay | Admitting: Family Medicine

## 2019-12-22 VITALS — BP 120/79 | HR 65 | Temp 97.7°F | Ht 74.0 in | Wt 213.0 lb

## 2019-12-22 DIAGNOSIS — I480 Paroxysmal atrial fibrillation: Secondary | ICD-10-CM | POA: Diagnosis not present

## 2019-12-22 DIAGNOSIS — R6889 Other general symptoms and signs: Secondary | ICD-10-CM | POA: Diagnosis not present

## 2019-12-22 DIAGNOSIS — E782 Mixed hyperlipidemia: Secondary | ICD-10-CM | POA: Diagnosis not present

## 2019-12-22 DIAGNOSIS — R42 Dizziness and giddiness: Secondary | ICD-10-CM

## 2019-12-22 DIAGNOSIS — R972 Elevated prostate specific antigen [PSA]: Secondary | ICD-10-CM

## 2019-12-22 DIAGNOSIS — R7309 Other abnormal glucose: Secondary | ICD-10-CM

## 2019-12-22 DIAGNOSIS — R5382 Chronic fatigue, unspecified: Secondary | ICD-10-CM

## 2019-12-22 DIAGNOSIS — C61 Malignant neoplasm of prostate: Secondary | ICD-10-CM | POA: Insufficient documentation

## 2019-12-22 DIAGNOSIS — Z7901 Long term (current) use of anticoagulants: Secondary | ICD-10-CM

## 2019-12-22 LAB — BAYER DCA HB A1C WAIVED: HB A1C (BAYER DCA - WAIVED): 5.3 % (ref <7.0)

## 2019-12-22 NOTE — Progress Notes (Signed)
Subjective: CC: f/u lipid, Afib, BPH PCP: [name], DO Jordan [name] is a 74 y.o. male presenting to clinic today for:  1.  Atrial fibrillation Patient is compliant with his diltiazem, Eliquis.  He does report some chronic fatigue since being diagnosed and treated for COVID-19 several months ago.  He does not report any heart palpitations, hematochezia, melena.  He had scant bleeding after a prostate procedure.  But that has resolved.  2.  BPH/prostate cancer Patient with known BPH and diagnosed with slow-growing prostate cancer.  He has switched his care to [place], Dr Hal Neer.  Does not report any night sweats, unplanned weight loss.  He does report chronic fatigue as above.  Again onset seemingly related to COVID-19.  However, he would like to go ahead and get a sleep study.  He has been having intermittent dizziness (twice).  He has worsening of his tinnitus since COVID-19.  3.  Sugar in urine Patient recently had a home visit from a healthcare nurse and was noted to have some sugar in his urine.  He would like to have that checked out today.  ROS: Per HPI  Allergies  Allergen Reactions  . Levofloxacin Other (See Comments)    Couldn't breathe, passed out  . Lopid [Gemfibrozil] Other (See Comments)    Loose bowels  . Sulfonamide Derivatives Hives   Past Medical History:  Diagnosis Date  . Anxiety    Mild  . Atrial fibrillation (HCC)    Paroxysmal  . BPH (benign prostatic hyperplasia)   . CAD (coronary artery disease)    Stent LAD, 2002  /  nuclear 2009, no ischemia  /    [facility] October, 2011 nuclear, no ischemia, possible slight apical scar, ejection fraction 70%  . Carotid bruit    Doppler November, 2011, normal  . Chest pain 02/23/2013  . Chronic anticoagulation    Low CHADS , score, but patient wants to be aggressive  . Colon polyps   . Drug intolerance    Mild beta blocker intolerance  . Dyslipidemia   . Ejection fraction      EF 65-70%, echo, 2007 /  EF 70%, nuclear, 2011  . Hyperlipidemia   . IBS (irritable bowel syndrome)   . Incomplete RBBB   . Knee pain    left  . Personal history of colonic adenomas 11/29/2006   Qualifier: Diagnosis of  By: [place], [place]    . Syncope    November, 2010    Current Outpatient Medications:  .  apixaban (ELIQUIS) 5 MG TABS tablet, Take 1 tablet (5 mg total) by mouth 2 (two) times daily., Disp: 180 tablet, Rfl: 0 .  atorvastatin (LIPITOR) 80 MG tablet, TAKE 1 TABLET DAILY AT 6PM, Disp: 90 tablet, Rfl: 3 .  Calcium Carb-Cholecalciferol (CALCIUM 600+D3 PO), Take 1 capsule by mouth daily. , Disp: , Rfl:  .  carboxymethylcellulose (REFRESH PLUS) 0.5 % SOLN, Place 1 drop into both eyes 3 (three) times daily as needed. For dry eyes, Disp: , Rfl:  .  Cholecalciferol (VITAMIN D3) 1000 UNITS CAPS, Take 1,000 Units by mouth daily. , Disp: , Rfl:  .  diltiazem (CARDIZEM CD) 180 MG 24 hr capsule, Take 1 capsule (180 mg total) by mouth daily., Disp: 90 capsule, Rfl: 1 .  diltiazem (CARDIZEM) 60 MG tablet, Take 1 tablet (60 mg total) by mouth as needed., Disp: 90 tablet, Rfl: 1 .  finasteride (PROSCAR) 5 MG tablet, Take by mouth., Disp: ,  Rfl:  .  fish oil-omega-3 fatty acids 1000 MG capsule, Take 1 g by mouth daily. , Disp: , Rfl:  .  Glucosamine-Chondroitin 1500-1200 MG/30ML LIQD, Take 1,500 mg by mouth daily. , Disp: , Rfl:  .  Multiple Vitamins-Minerals (MULTIVITAMIN WITH MINERALS) tablet, Take 1 tablet by mouth daily. , Disp: , Rfl:  .  nitroGLYCERIN (NITROSTAT) 0.4 MG SL tablet, Place 1 tablet (0.4 mg total) under the tongue every 5 (five) minutes as needed for chest pain., Disp: 25 tablet, Rfl: 3 .  Probiotic Product (ALIGN PO), Take 1 tablet by mouth daily. As needed, Disp: , Rfl:  Social History   Socioeconomic History  . Marital status: Married    Spouse name: [name]   . Number of children: 3  . Years of education: bachelors  . Highest education level: Not on file   Occupational History  . Occupation: Dental Laboratory    Comment: Makes dental appliances  Tobacco Use  . Smoking status: Never Smoker  . Smokeless tobacco: Never Used  Vaping Use  . Vaping Use: Never used  Substance and Sexual Activity  . Alcohol use: No  . Drug use: No  . Sexual activity: Not on file  Other Topics Concern  . Not on file  Social History Narrative   Married, he has children and grandchildren.   Lives on [address] acres, raises chickens, works as a Neurosurgeon    11/23/2015   Social Determinants of Health   Financial Resource Strain:   . Difficulty of Paying Living Expenses:   Food Insecurity:   . Worried About Charity fundraiser in the Last Year:   . Arboriculturist in the Last Year:   Transportation Needs:   . Film/video editor (Medical):   Marland Kitchen Lack of Transportation (Non-Medical):   Physical Activity:   . Days of Exercise per Week:   . Minutes of Exercise per Session:   Stress:   . Feeling of Stress :   Social Connections:   . Frequency of Communication with Friends and Family:   . Frequency of Social Gatherings with Friends and Family:   . Attends Religious Services:   . Active Member of Clubs or Organizations:   . Attends Archivist Meetings:   Marland Kitchen Marital Status:   Intimate Partner Violence:   . Fear of Current or Ex-Partner:   . Emotionally Abused:   Marland Kitchen Physically Abused:   . Sexually Abused:    Family History  Problem Relation Age of Onset  . Heart attack Mother   . Diabetes Mother   . Heart attack Brother   . Heart disease Brother   . Stroke Maternal Grandfather   . Stroke Paternal Grandfather   . Macular degeneration Sister   . Cancer Daughter        colon   . Obesity Sister   . Paranoid behavior Sister   . Cancer Sister        lung - uterus   . COPD Sister   . Colon cancer Neg Hx     Objective: Office vital signs reviewed. There were no vitals taken for this visit.  Physical Examination:  General: Awake,  alert, tired appearing. No acute distress HEENT: Normal, sclera white, MMM Cardio: regular rate and rhythm, S1S2 heard, no murmurs appreciated Pulm: clear to auscultation bilaterally, no wheezes, rhonchi or rales; normal work of breathing on room air Psych: Mood stable, speech normal, affect appropriate, pleasant and interactive  Assessment/ Plan: 74 y.o.  male   1. Mixed hyperlipidemia Check lipid panel - Lipid Panel - CMP14+EGFR - TSH  2. Paroxysmal atrial fibrillation (HCC) Seems to be in controlled rate and rhythm today - TSH - Ambulatory referral to Neurology  3. Long term (current) use of anticoagulants  4. Malignant neoplasm prostate [facility]) Being treated at [place], Dr. Hal Neer.  Will CC PSA results to them - PSA  5. Elevated PSA - CBC with Differential - Lactate Dehydrogenase - PSA  6. Chronic fatigue Uncertain etiology.  Will evaluate for elevation sugar.  His prostate cancer was noted to be slow-growing but will collect an LDH and CBC with differential.  I placed a referral to neurology for sleep testing.  I have also gone ahead and added an ANA given some evidence of COVID-19 triggering autoimmune disorders. - CBC with Differential - Vitamin B12 - Lactate Dehydrogenase - Ambulatory referral to Neurology - ANA w/Reflex if Positive  7. Dizziness Orthostatics were negative today  8. Elevated glucose - Bayer DCA Hb A1c Waived   No orders of the defined types were placed in this encounter.  No orders of the defined types were placed in this encounter.    [name], DO [place] [phone]

## 2019-12-22 NOTE — Patient Instructions (Signed)

## 2019-12-23 LAB — CMP14+EGFR
ALT: 31 IU/L (ref 0–44)
AST: 27 IU/L (ref 0–40)
Albumin/Globulin Ratio: 1.5 (ref 1.2–2.2)
Albumin: 4.1 g/dL (ref 3.7–4.7)
Alkaline Phosphatase: 79 IU/L (ref 48–121)
BUN/Creatinine Ratio: 18 (ref 10–24)
BUN: 19 mg/dL (ref 8–27)
Bilirubin Total: 0.7 mg/dL (ref 0.0–1.2)
CO2: 23 mmol/L (ref 20–29)
Calcium: 9.2 mg/dL (ref 8.6–10.2)
Chloride: 103 mmol/L (ref 96–106)
Creatinine, Ser: 1.05 mg/dL (ref 0.76–1.27)
GFR calc Af Amer: 82 mL/min/{1.73_m2} (ref >59)
GFR calc non Af Amer: 71 mL/min/{1.73_m2} (ref >59)
Globulin, Total: 2.7 g/dL (ref 1.5–4.5)
Glucose: 75 mg/dL (ref 65–99)
Potassium: 4.7 mmol/L (ref 3.5–5.2)
Sodium: 139 mmol/L (ref 134–144)
Total Protein: 6.8 g/dL (ref 6.0–8.5)

## 2019-12-23 LAB — CBC WITH DIFFERENTIAL/PLATELET
Basophils Absolute: 0.1 10*3/uL (ref 0.0–0.2)
Basos: 1 %
EOS (ABSOLUTE): 0.3 10*3/uL (ref 0.0–0.4)
Eos: 3 %
Hematocrit: 47.4 % (ref 37.5–51.0)
Hemoglobin: 15.6 g/dL (ref 13.0–17.7)
Immature Grans (Abs): 0.1 10*3/uL (ref 0.0–0.1)
Immature Granulocytes: 1 %
Lymphocytes Absolute: 1.6 10*3/uL (ref 0.7–3.1)
Lymphs: 18 %
MCH: 29.7 pg (ref 26.6–33.0)
MCHC: 32.9 g/dL (ref 31.5–35.7)
MCV: 90 fL (ref 79–97)
Monocytes Absolute: 0.6 10*3/uL (ref 0.1–0.9)
Monocytes: 7 %
Neutrophils Absolute: 6.1 10*3/uL (ref 1.4–7.0)
Neutrophils: 70 %
Platelets: 254 10*3/uL (ref 150–450)
RBC: 5.25 x10E6/uL (ref 4.14–5.80)
RDW: 12.3 % (ref 11.6–15.4)
WBC: 8.6 10*3/uL (ref 3.4–10.8)

## 2019-12-23 LAB — LIPID PANEL
Chol/HDL Ratio: 4.1 ratio (ref 0.0–5.0)
Cholesterol, Total: 138 mg/dL (ref 100–199)
HDL: 34 mg/dL — ABNORMAL LOW (ref >39)
LDL Chol Calc (NIH): 79 mg/dL (ref 0–99)
Triglycerides: 140 mg/dL (ref 0–149)
VLDL Cholesterol Cal: 25 mg/dL (ref 5–40)

## 2019-12-23 LAB — VITAMIN B12: Vitamin B-12: 762 pg/mL (ref 232–1245)

## 2019-12-23 LAB — LACTATE DEHYDROGENASE: LDH: 240 IU/L — ABNORMAL HIGH (ref 121–224)

## 2019-12-23 LAB — TSH: TSH: 3.35 u[IU]/mL (ref 0.450–4.500)

## 2019-12-23 LAB — PSA: Prostate Specific Ag, Serum: 11.4 ng/mL — ABNORMAL HIGH (ref 0.0–4.0)

## 2019-12-23 LAB — ANA W/REFLEX IF POSITIVE: Anti Nuclear Antibody (ANA): NEGATIVE

## 2019-12-25 DIAGNOSIS — M9903 Segmental and somatic dysfunction of lumbar region: Secondary | ICD-10-CM | POA: Diagnosis not present

## 2019-12-25 DIAGNOSIS — M9901 Segmental and somatic dysfunction of cervical region: Secondary | ICD-10-CM | POA: Diagnosis not present

## 2019-12-25 DIAGNOSIS — M9902 Segmental and somatic dysfunction of thoracic region: Secondary | ICD-10-CM | POA: Diagnosis not present

## 2019-12-25 DIAGNOSIS — M47812 Spondylosis without myelopathy or radiculopathy, cervical region: Secondary | ICD-10-CM | POA: Diagnosis not present

## 2019-12-25 DIAGNOSIS — R42 Dizziness and giddiness: Secondary | ICD-10-CM | POA: Diagnosis not present

## 2020-01-21 ENCOUNTER — Ambulatory Visit: Payer: Medicare Other | Admitting: Neurology

## 2020-01-21 ENCOUNTER — Encounter: Payer: Self-pay | Admitting: Neurology

## 2020-01-21 ENCOUNTER — Other Ambulatory Visit: Payer: Self-pay

## 2020-01-21 VITALS — BP 112/72 | HR 65 | Ht 74.0 in | Wt 217.0 lb

## 2020-01-21 DIAGNOSIS — G4719 Other hypersomnia: Secondary | ICD-10-CM | POA: Insufficient documentation

## 2020-01-21 DIAGNOSIS — I4510 Unspecified right bundle-branch block: Secondary | ICD-10-CM

## 2020-01-21 DIAGNOSIS — F518 Other sleep disorders not due to a substance or known physiological condition: Secondary | ICD-10-CM | POA: Diagnosis not present

## 2020-01-21 DIAGNOSIS — Z7901 Long term (current) use of anticoagulants: Secondary | ICD-10-CM

## 2020-01-21 DIAGNOSIS — I480 Paroxysmal atrial fibrillation: Secondary | ICD-10-CM

## 2020-01-21 NOTE — Progress Notes (Signed)
SLEEP MEDICINE CLINIC    Provider:  Larey Seat, MD  Primary Care Physician:  [name], DO [place] [address]     Referring Provider: Halford [place],   [id number]          Chief Complaint according to patient   Patient presents with:    . New Patient (Initial Visit)     alone, rm 10. presents today for OSA eval. states never had a SS. years ago his cardiologist reccomended he have eval done when dg with Afib but he never pursued it. he had covid last year and suffered with brain fog and chronic fatigue since then. wakes up feeling exhausted.       HISTORY OF PRESENT ILLNESS:  Jordan [name] is a 74 y.o. year old Caucasian male patient seen here upon recommendation of cardiology,   referral on 01/21/2020 from PCP. Chief concern according to patient :  ' I am a Covid 60 survivor 05-2019- contracted at work from a coworker". I had to retire because I couldn't recover from the fatigue. I  Worked in a Development worker, international aid ".    I have the pleasure of seeing Jordan [name] on 01-21-2020, a right-handed  Caucasian male and grandfather of 25 with a possible sleep disorder. He  has a past medical history of post Covid 19 Atrial fibrillation ([place]), BPH (benign prostatic hyperplasia), CAD (coronary artery disease), Carotid bruit, Chest pain (02/23/2013), Chronic anticoagulation, prostate cancer , 2020- Colon polyps,  Dyslipidemia, impaired Ejection fraction, Hyperlipidemia, IBS (irritable bowel syndrome), Incomplete RBBB, Knee pain, Personal history of colonic adenomas (11/29/2006), and Syncope. He considers himself fatigued since Covid,     Sleep relevant medical history:  Post COVID status and vaccinated- snoring, fatigue, atrial fibrillation, breathing treatments. Never dropped oxygen saturation at night- I had many,many nightmares, hallucinations while sick with Covid.  Also tinnitus but no olfactory loss.  Family medical /sleep  history: no other family member on CPAP with OSA, insomnia, sleep walkers.    Social history:  Patient is recently retired from his Development worker, international aid and lives in a household with spouse. Tobacco use: never .  ETOH use ; never, Caffeine intake in form of Coffee( decaffeinated- rare ) Soda( /) Tea ( /) or energy drinks.Regular exercise in form of walking .   Hobbies : gardening, reading ,fishing by kayak.    Sleep habits are as follows: The patient's dinner time is between 6 PM. The patient goes to bed at 11 PM and he continues to struggle with tinnitus - once asleep he will sleep for 7 hours, wakes for 3 bathroom breaks, the first time at 2-3 AM.   The preferred sleep position is laterally, with the support of 1-2 pillows.  Dreams are reportedly frequent/vivid ever since COVID.  6.30 AM is the usual rise time. The patient wakes up spontaneously/ at 6 AM. He reports not feeling refreshed or restored in AM, with symptoms such as dry mouth , morning headaches , and residual fatigue.  Naps are taken frequently now since retirement and since COVID- , lasting from 60 minutes and are refreshing.   Review of Systems: Out of a complete 14 system review, the patient complains of only the following symptoms, and all other reviewed systems are negative.:  Fatigue, excessive daytime sleepiness , snoring, fragmented sleep,  Vivid dreams, tinnitus   How likely are you to doze in the following situations:  0 = not likely, 1 = slight chance, 2 = moderate chance, 3 = high chance   Sitting and Reading? Watching Television? Sitting inactive in a public place (theater or meeting)? As a passenger in a car for an hour without a break? Lying down in the afternoon when circumstances permit? Sitting and talking to someone? Sitting quietly after lunch without alcohol? In a car, while stopped for a few minutes in traffic?   Total = 18/ 24 points   FSS endorsed at 51/ 63 points.   Social History   Socioeconomic  History  . Marital status: Married    Spouse name: Jordan [name]   . Number of children: 3  . Years of education: bachelors  . Highest education level: Not on file  Occupational History  . Occupation: Dental Laboratory    Comment: Makes dental appliances  Tobacco Use  . Smoking status: Never Smoker  . Smokeless tobacco: Never Used  Vaping Use  . Vaping Use: Never used  Substance and Sexual Activity  . Alcohol use: No  . Drug use: No  . Sexual activity: Yes  Other Topics Concern  . Not on file  Social History Narrative   Married, he has children and grandchildren.   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 Difficulty of Paying Living Expenses:   Food Insecurity:   . Worried About Charity fundraiser in the Last Year:   . Arboriculturist in the Last Year:   Transportation Needs:   . Film/video editor (Medical):   Marland Kitchen Lack of Transportation (Non-Medical):   Physical Activity:   . Days of Exercise per Week:   . Minutes of Exercise per Session:   Stress:   . Feeling of Stress :   Social Connections:   . Frequency of Communication with Friends and Family:   . Frequency of Social Gatherings with Friends and Family:   . Attends Religious Services:   . Active Member of Clubs or Organizations:   . Attends Archivist Meetings:   Marland Kitchen Marital Status:     Family History  Problem Relation Age of Onset  . Heart attack Mother   . Diabetes Mother   . Heart attack Brother   . Heart disease Brother   . Stroke Maternal Grandfather   . Stroke Paternal Grandfather   . Macular degeneration Sister   . Cancer Daughter        colon   . Obesity Sister   . Paranoid behavior Sister   . Cancer Sister        lung - uterus   . COPD Sister   . Colon cancer Neg Hx     Past Medical History:  Diagnosis Date  . Anxiety    Mild  . Atrial fibrillation (HCC)    Paroxysmal  . BPH  (benign prostatic hyperplasia)   . CAD (coronary artery disease)    Stent LAD, 2002  /  nuclear 2009, no ischemia  /    [facility] October, 2011 nuclear, no ischemia, possible slight apical scar, ejection fraction 70%  . Carotid bruit    Doppler November, 2011, normal  . Chest pain 02/23/2013  . Chronic anticoagulation    Low CHADS , score, but patient wants to be aggressive  . Colon polyps   . Drug intolerance    Mild beta blocker intolerance  .  Dyslipidemia   . Ejection fraction    EF 65-70%, echo, 2007 /  EF 70%, nuclear, 2011  . Hyperlipidemia   . IBS (irritable bowel syndrome)   . Incomplete RBBB   . Knee pain    left  . Personal history of colonic adenomas 11/29/2006   Qualifier: Diagnosis of  By: [place], [place]    . Syncope    November, 2010    Past Surgical History:  Procedure Laterality Date  . BACK SURGERY  05/1990   [id number]  . CARDIAC CATHETERIZATION    . COLONOSCOPY  multiple     Current Outpatient Medications on File Prior to Visit  Medication Sig Dispense Refill  . apixaban (ELIQUIS) 5 MG TABS tablet Take 1 tablet (5 mg total) by mouth 2 (two) times daily. 180 tablet 0  . atorvastatin (LIPITOR) 80 MG tablet TAKE 1 TABLET DAILY AT 6PM 90 tablet 3  . Calcium Carb-Cholecalciferol (CALCIUM 600+D3 PO) Take 1 capsule by mouth daily.     . carboxymethylcellulose (REFRESH PLUS) 0.5 % SOLN Place 1 drop into both eyes 3 (three) times daily as needed. For dry eyes    . Cholecalciferol (VITAMIN D3) 1000 UNITS CAPS Take 1,000 Units by mouth daily.     Marland Kitchen diltiazem (CARDIZEM CD) 180 MG 24 hr capsule Take 1 capsule (180 mg total) by mouth daily. 90 capsule 1  . diltiazem (CARDIZEM) 60 MG tablet Take 1 tablet (60 mg total) by mouth as needed. 90 tablet 1  . finasteride (PROSCAR) 5 MG tablet Take by mouth.    . fish oil-omega-3 fatty acids 1000 MG capsule Take 1 g by mouth daily.     . Glucosamine-Chondroitin 1500-1200 MG/30ML LIQD Take 1,500 mg by mouth daily.     . Multiple  Vitamins-Minerals (MULTIVITAMIN WITH MINERALS) tablet Take 1 tablet by mouth daily.     . nitroGLYCERIN (NITROSTAT) 0.4 MG SL tablet Place 1 tablet (0.4 mg total) under the tongue every 5 (five) minutes as needed for chest pain. 25 tablet 3  . Probiotic Product (ALIGN PO) Take 1 tablet by mouth daily. As needed     No current facility-administered medications on file prior to visit.    Allergies  Allergen Reactions  . Levofloxacin Other (See Comments)    Couldn't breathe, passed out  . Lopid [Gemfibrozil] Other (See Comments)    Loose bowels  . Sulfonamide Derivatives Hives    Physical exam:  Today's Vitals   01/21/20 1532  BP: 112/72  Pulse: 65  Weight: 217 lb (98.4 kg)  Height: 6\' 2"  (1.88 m)   Body mass index is 27.86 kg/m.   Wt Readings from Last 3 Encounters:  01/21/20 217 lb (98.4 kg)  12/22/19 213 lb (96.6 kg)  06/20/19 219 lb (99.3 kg)     Ht Readings from Last 3 Encounters:  01/21/20 6\' 2"  (1.88 m)  12/22/19 6\' 2"  (1.88 m)  06/20/19 6\' 2"  (1.88 m)      General: The patient is awake, alert and appears not in acute distress. The patient is well groomed. Head: Normocephalic, atraumatic. Neck is supple. Mallampati: 3 plus , crowded lower jaw, small mouth-  neck circumference: 16.5 inches . Nasal airflow  patent.   Dental status: intact.  Cardiovascular:  Regular rate and cardiac rhythm by pulse, without distended neck veins. Respiratory: Lungs are clear to auscultation.  Skin:  Without evidence of ankle edema, or rash. Trunk: The patient's posture is erect.   Neurologic exam : The patient  is awake and alert, oriented to place and time.   Memory subjective described as intact.  Attention span & concentration ability appears normal.  Speech is fluent,  without  dysarthria, dysphonia or aphasia.  Mood and affect are appropriate.   Cranial nerves: no loss of smell or taste reported  Pupils are equal and briskly reactive to light. Funduscopic exam  - beginning  cataract right lens only.  Extraocular movements in vertical and horizontal planes were intact and without nystagmus. No Diplopia. Visual fields by finger perimetry are intact. Hearing was intact to soft voice and finger rubbing.    Facial sensation intact to fine touch.  Facial motor strength is symmetric and tongue and uvula move midline.  Neck ROM : rotation, tilt and flexion extension were normal for age and shoulder shrug was symmetrical.    Motor exam:  Symmetric bulk, tone and ROM.   Normal tone without cog wheeling, symmetric grip strength .   Sensory:  Fine touch,and vibration were normal.  Proprioception tested in the upper extremities was normal.   Coordination: Rapid alternating movements in the fingers/hands were of normal speed.  The Finger-to-nose maneuver was intact without evidence of ataxia, dysmetria or tremor.   Gait and station: Patient could rise unassisted from a seated position, walked without assistive device.  Stance is of normal width/ base and the patient turned with 3 steps.  Toe and heel walk were deferred.  Deep tendon reflexes: in the  upper and lower extremities are symmetric and intact.  Babinski response was deferred.      After spending a total time of 45 minutes face to face and additional time for physical and neurologic examination, review of laboratory studies,  personal review of imaging studies, reports and results of other testing and review of referral information / records as far as provided in visit, I have established the following assessments:  1)  Patient with atrial fibrillation, hypercholesterolemia, CAD, and small , crowded jaw-  The patient is predisposed to OSA, he snores.  2)  Post Covid, he experienced a change in sleep duration, in daytime sleepiness, and in dream pressure. He remains with excessive daytime sleepiness. He remains fatigued and with tinnitus.      My Plan is to proceed with: 1) screening for OSA , either by HST  or PSG.   I would like to thank [name], DO  for allowing me to meet with and to take care of this pleasant patient.   In short, Jordan [name] is presenting with excessive sleepiness, fatigue, post Covid and atrial fibrillatin.  I plan to follow up either personally or through our NP within 2-3  month.    Electronically signed by: Larey Seat, MD 01/21/2020 3:57 PM  [facility] Board certified by The [employer] of Sleep Medicine and Diplomate of the [employer] of Sleep Medicine. Board certified In Neurology through the [place], Fellow of the [employer] of Neurology. Medical Director of [facility].

## 2020-01-21 NOTE — Patient Instructions (Signed)

## 2020-02-01 ENCOUNTER — Ambulatory Visit (INDEPENDENT_AMBULATORY_CARE_PROVIDER_SITE_OTHER): Payer: Medicare Other | Admitting: Neurology

## 2020-02-01 DIAGNOSIS — I4510 Unspecified right bundle-branch block: Secondary | ICD-10-CM

## 2020-02-01 DIAGNOSIS — F518 Other sleep disorders not due to a substance or known physiological condition: Secondary | ICD-10-CM

## 2020-02-01 DIAGNOSIS — I480 Paroxysmal atrial fibrillation: Secondary | ICD-10-CM

## 2020-02-01 DIAGNOSIS — G4719 Other hypersomnia: Secondary | ICD-10-CM

## 2020-02-01 DIAGNOSIS — Z7901 Long term (current) use of anticoagulants: Secondary | ICD-10-CM

## 2020-02-01 DIAGNOSIS — G4710 Hypersomnia, unspecified: Secondary | ICD-10-CM

## 2020-02-01 DIAGNOSIS — Z8616 Personal history of COVID-19: Secondary | ICD-10-CM

## 2020-02-03 ENCOUNTER — Other Ambulatory Visit: Payer: Self-pay | Admitting: Family Medicine

## 2020-02-03 DIAGNOSIS — I480 Paroxysmal atrial fibrillation: Secondary | ICD-10-CM

## 2020-02-10 MED ORDER — ALPRAZOLAM 0.5 MG PO TABS: ORAL_TABLET | ORAL | 0 refills | Status: DC

## 2020-02-10 NOTE — Procedures (Signed)
PATIENT'S NAME:  Jordan, [name] DOB:      [birth year]      MR#:    [id number]     DATE OF RECORDING: 02/01/2020 REFERRING M.D.:  Ronnie Doss DO Study Performed:   Expanded EEG  Polysomnogram HISTORY:  Jordan [name] is a 74 y.o. year old Caucasian male patient seen here upon recommendation of cardiology, via referral on 01/21/2020 from PCP. Chief concern according to patient :  ' I am a Covid 57 survivor 05-2019- contracted at work from a coworker". I had to retire because I couldn't recover from the fatigue. I worked in a Development worker, international aid ". "I am excessively sleepy".     I have the pleasure of seeing Jordan [name] on 01-21-2020, a right-handed Caucasian male and grandfather of 59 with medical history of post Covid 81 onset of Atrial fibrillation ([place]), BPH (benign prostatic hyperplasia), CAD (coronary artery disease), Carotid bruit, Chest pain (02/23/2013), Chronic anticoagulation, prostate cancer , 2020- Colon polyps,  Dyslipidemia, impaired Ejection fraction, Hyperlipidemia, IBS (irritable bowel syndrome), Incomplete RBBB, Knee pain, Personal history of colonic adenomas (11/29/2006), and Syncope. He considers himself fatigued since Covid,      Sleep relevant medical history:  Post COVID status and vaccinated- snoring, fatigue, atrial fibrillation, breathing treatments. Never dropped oxygen saturation at night- "I had many nightmares, hallucinations while sick with Covid. Also tinnitus but no olfactory loss. The patient goes to bed at 11 PM and he continues to struggle with tinnitus - once asleep he will sleep for 7 hours, wakes for 3 bathroom breaks, the first time at 2-3 AM.   Dreams are reportedly frequent/vivid ever since COVID. Naps are taken frequently now since retirement and since COVID - lasting from 60 minutes and are refreshing.    The patient endorsed the Epworth Sleepiness Scale at 18/24 points.   The patient's weight 217 pounds with a height of 74 (inches), resulting in a BMI of  27.7 kg/m2. The patient's neck circumference measured 16.5 inches.  CURRENT MEDICATIONS: Eliquis, Lipitor, cholecalciferol, Vitamin D3, Cardizem, Proscar, Omega-3, Multivitamins, Nitrostat, Align PO.   PROCEDURE:  This is a multichannel digital polysomnogram utilizing the Somnostar 11.2 system.  Electrodes and sensors were applied and monitored per AASM Specifications.   EEG, EOG, Chin and Limb EMG, were sampled at 200 Hz.  ECG, Snore and Nasal Pressure, Thermal Airflow, Respiratory Effort, CPAP Flow and Pressure, Oximetry was sampled at 50 Hz. Digital video and audio were recorded.      BASELINE STUDY :Lights Out was at 21:42 and Lights On at 05:01.  Total recording time (TRT) was 439.5 minutes, with a total sleep time (TST) of 122.5 minutes.   The patient's sleep latency was 11.5 minutes.  REM latency was 0 minutes.  The sleep efficiency was extremely poor at 27.9 %.     SLEEP ARCHITECTURE: WASO (Wake after sleep onset) was 300 minutes.  There were 10.5 minutes in Stage N1, 112 minutes Stage N2, 0 minutes Stage N3 and 0 minutes in Stage REM.  The percentage of Stage N1 was 8.6%, Stage N2 was 91.4%, Stage N3 was 0% and Stage R (REM sleep) was 0%.     RESPIRATORY ANALYSIS:  There were a total of 5 respiratory events:  1 obstructive apnea and  4 hypopneas with no respiratory event related arousals (RERAs).     The total APNEA/HYPOPNEA INDEX (AHI) was 2.4/hour and the total RESPIRATORY DISTURBANCE INDEX was  2.4 /hour.  0 events occurred in REM sleep and  8 events in NREM. The REM AHI was  0 /hour, versus a non-REM AHI of 2.4/h. The patient spent 76 minutes of total sleep time in the supine position and 47 minutes in non-supine. The supine AHI was 4.0/h versus a non-supine AHI of 0.0.  OXYGEN SATURATION & C02:  The Wake baseline 02 saturation was 96%, with the lowest being 88%. Time spent below 89% saturation equaled 0 minutes.  The arousals were noted as: 17 were spontaneous, 0 were associated with  PLMs, 0 were associated with respiratory events. The patient had a total of 0 Periodic Limb Movements.    Audio and video analysis did not show any abnormal or unusual movements, behaviors, phonations or vocalizations. The patient was simply not able to sleep- was very restless.   The patient took bathroom breaks. Snoring was noted. EKG was in keeping with normal sinus rhythm (NSR).  IMPRESSION: Altered sleep architecture with 122 minutes of sleep time only, and no REM sleep captured, no N3 sleep captured. Post-study, the patient indicated that sleep was shorter than usual.   RECOMMENDATIONS:  1. Advise repeat, attended, study under sleep aid, such as Xanax for short term use.  2. If the patient feels this is not going to work, I will accept a HST.      I certify that I have reviewed the entire raw data recording prior to the issuance of this report in accordance with the Standards of Accreditation of the American Academy of Sleep Medicine (AASM)  Larey Seat, MD Diplomat, American Board of Psychiatry and Neurology  Diplomat, American Board of Sleep Medicine Market researcher, [place] [facility] [employer]

## 2020-02-10 NOTE — Addendum Note (Signed)
Addended by: Larey Seat on: 02/10/2020 05:42 PM   Modules accepted: Orders

## 2020-02-10 NOTE — Progress Notes (Signed)
IMPRESSION:  Altered sleep architecture with 122 minutes of sleep time only,  and no REM sleep captured, no N3 sleep captured. Post-study, the  patient indicated that sleep was shorter than usual.   RECOMMENDATIONS:   1. Advise repeat, attended, study under sleep aid, such as Xanax  for short term use.  2. If the patient feels this is not going to work, I will accept  a HST.

## 2020-02-11 ENCOUNTER — Telehealth: Payer: Self-pay | Admitting: Neurology

## 2020-02-11 ENCOUNTER — Encounter: Payer: Self-pay | Admitting: Neurology

## 2020-02-11 NOTE — Telephone Encounter (Signed)
-----   Message from Larey Seat, MD sent at 02/10/2020  5:42 PM EDT ----- IMPRESSION:  Altered sleep architecture with 122 minutes of sleep time only,  and no REM sleep captured, no N3 sleep captured. Post-study, the  patient indicated that sleep was shorter than usual.   RECOMMENDATIONS:   1. Advise repeat, attended, study under sleep aid, such as Xanax  for short term use.  2. If the patient feels this is not going to work, I will accept  a HST.

## 2020-02-11 NOTE — Telephone Encounter (Signed)
Called patient to discuss sleep study results. No answer at this time. LVM for the patient to call back.  °Will send a mychart message as well to the pt. °

## 2020-03-03 DIAGNOSIS — D225 Melanocytic nevi of trunk: Secondary | ICD-10-CM | POA: Diagnosis not present

## 2020-03-03 DIAGNOSIS — L820 Inflamed seborrheic keratosis: Secondary | ICD-10-CM | POA: Diagnosis not present

## 2020-03-03 DIAGNOSIS — Z85828 Personal history of other malignant neoplasm of skin: Secondary | ICD-10-CM | POA: Diagnosis not present

## 2020-03-03 DIAGNOSIS — L570 Actinic keratosis: Secondary | ICD-10-CM | POA: Diagnosis not present

## 2020-03-03 DIAGNOSIS — D1801 Hemangioma of skin and subcutaneous tissue: Secondary | ICD-10-CM | POA: Diagnosis not present

## 2020-03-08 ENCOUNTER — Other Ambulatory Visit: Payer: Self-pay

## 2020-03-08 ENCOUNTER — Ambulatory Visit (INDEPENDENT_AMBULATORY_CARE_PROVIDER_SITE_OTHER): Payer: Medicare Other | Admitting: Neurology

## 2020-03-08 DIAGNOSIS — G4710 Hypersomnia, unspecified: Secondary | ICD-10-CM

## 2020-03-08 DIAGNOSIS — I480 Paroxysmal atrial fibrillation: Secondary | ICD-10-CM

## 2020-03-08 DIAGNOSIS — G4719 Other hypersomnia: Secondary | ICD-10-CM

## 2020-03-08 DIAGNOSIS — F518 Other sleep disorders not due to a substance or known physiological condition: Secondary | ICD-10-CM

## 2020-03-08 DIAGNOSIS — Z8616 Personal history of COVID-19: Secondary | ICD-10-CM

## 2020-03-15 NOTE — Addendum Note (Signed)
Addended by: Larey Seat on: 03/15/2020 05:17 PM   Modules accepted: Orders

## 2020-03-15 NOTE — Progress Notes (Signed)
Summary & Diagnosis:   There is very mild sleep apnea noted here- the AHI is only 5.5/h,  with NREM AHI of only 3.7/h and REM AHI of 11.7/h. there is no  clinically relevant hypoxia associated and the heart rate trend  is towards bradycardia.    Recommendations:    Given the very high daytime sleepiness score of Epworth 18, I  need to first treat sleep apnea, however mild, before pursuing a  narcolepsy evaluation. The patient can use a dental device or  CPAP for at least 60 days before we re-evaluate his sleepiness  degree and initiate further work up.  Post-viral fatigue and EDS are known conditions that have  affected Covid patients.  I will initiate auto CPAP at 5-12 cm water, no EPR and mask of  patient's choice in the meantime.    Interpreting Physician: Larey Seat, MD

## 2020-03-15 NOTE — Procedures (Signed)
Sleep Study Report   Patient Information     First Name: Jordan Last Name: [name] ID: [id number]  Birth Date: [birth year] Age: 74 Gender:  Referring Provider: [name], DO BMI: 28.0 (W=218 lbs, H=6' 2'')  Neck Circ.:  17 '' Epworth:  18/24  Sleep Study Information    Study Date: 03/08/20 S/H/A Version: [id number] / [id number] / 79  History:    Jordan [name] is a 74 y.o. year old Caucasian male patient seen here upon recommendation of his cardiology specialist, referral for sleep consultation on 01/21/2020 from PCP.  Chief concern according to patient:  ' I am a Covid 34 survivor 05-2019- contracted at work from a coworker". I had to retire because I couldn't recover from the fatigue. I Worked in a Development worker, international aid ". [name] on 01-21-2020, a right-handed Caucasian male and grandfather of 66 with a possible sleep disorder. He has a past medical history of post Covid 19 Atrial fibrillation ([place]), BPH (benign prostatic hyperplasia), CAD (coronary artery disease), Carotid bruit, Chest pain (02/23/2013), Chronic anticoagulation, prostate cancer, 2020- Colon polyps, Dyslipidemia, impaired Ejection fraction, Hyperlipidemia, IBS (irritable bowel syndrome), Incomplete RBBB, Knee pain, Personal history of colonic adenomas (11/29/2006), and Syncope.  He considers himself fatigued since contracting Covid.  Summary & Diagnosis:    There is very mild sleep apnea noted here- the AHI is only 5.5/h, with NREM AHI of only 3.7/h and REM AHI of 11.7/h. there is no clinically relevant  hypoxia associated and the heart rate trend is towards bradycardia.    Recommendations:     Given the very high daytime sleepiness score of Epworth 18, I need to first treat sleep apnea, however mild, before pursuing a narcolepsy evaluation. The patient can use a dental device or CPAP for at least 60 days before we re-evaluate his sleepiness degree and initiate further work up.  Post-viral fatigue and EDS are known  conditions that have affected Covid patients. I will initiate auto CPAP at 5-12 cm water, no EPR and mask of patient's choice in the meantime.    Interpreting Physician: Larey Seat, MD           Sleep Summary  Oxygen Saturation Statistics   Start Study Time: End Study Time: Total Recording Time:          11:16:46 PM 7:52:59 AM   8 h, 36 min  Total Sleep Time % REM of Sleep Time:  7 h, 21 min  22.3    Mean: 94 Minimum: 82 Maximum: 98  Mean of Desaturations Nadirs (%):   89  Oxygen Desaturation. %:   4-9 10-20 >20 Total  Events Number Total    20  4 83.3 16.7  0 0.0  24 100.0  Oxygen Saturation: <90 <=88 <85 <80 <70  Duration (minutes): Sleep % 2.8 0.6  2.1 0.3  0.5 0.1 0.0 0.0 0.0 0.0     Respiratory Indices      Total Events REM NREM All Night  pRDI: pAHI 3%: ODI 4%: pAHIc 3%: % CSR: pAHI 4%:  50  40  24  0 0.0 20 14.8 11.7 8.0 0.0 4.6 3.7 1.9 0.0 6.8 5.5 3.3 0.0 2.7       Pulse Rate Statistics during Sleep (BPM)      Mean: 61 Minimum: 44 Maximum: 84    Indices are calculated using technically valid sleep time of  7 h, 19 min.        5  15                    30         pAHI=5.5                                                         Mild              Moderate                    Severe             Body Position Statistics  Position Supine Prone Right Left Non-Supine  Sleep (min) 348.7 0.0 90.5 1.0 91.5  Sleep % 79.0 0.0 20.5 0.2 20.7  pRDI 7.6 N/A 4.0 N/A 4.0  pAHI 3% 6.7 N/A 0.7 N/A 0.7  ODI 4% 4.2 N/A 0.0 N/A 0.0            Left   Right  Supine    Snoring Statistics Snoring Level (dB) >40 >50 >60 >70 >80 >Threshold (45)  Sleep (min) 155.0 7.9 1.6 0.0 0.0 30.6  Sleep % 35.1 1.8 0.4 0.0 0.0 6.9    Mean: 41 dB

## 2020-03-16 ENCOUNTER — Encounter: Payer: Self-pay | Admitting: Neurology

## 2020-03-16 ENCOUNTER — Telehealth: Payer: Self-pay | Admitting: Neurology

## 2020-03-16 NOTE — Telephone Encounter (Signed)
I called pt. I advised pt that Dr. Brett Fairy reviewed their sleep study results and found that pt has very mild sleep apnea. Dr. Brett Fairy recommends that pt starts auto CPAP or a dental device.pt agreed to auto CPAP I reviewed PAP compliance expectations with the pt. Pt is agreeable to starting a CPAP. I advised pt that an order will be sent to a DME, [facility] ([facility] and [facility] ([place]) will call the pt within about one week after they file with the pt's insurance. [facility]) will show the pt how to use the machine, fit for masks, and troubleshoot the CPAP if needed. A follow up appt was made for insurance purposes with Dr. Brett Fairy on Jan 11,2022 at 9:30 am. Pt verbalized understanding to arrive 15 minutes early and bring their CPAP. A letter with all of this information in it will be mailed to the pt as a reminder. I verified with the pt that the address we have on file is correct. Pt verbalized understanding of results. Pt had no questions at this time but was encouraged to call back if questions arise. I have sent the order to [facility]) and have received confirmation that they have received the order.

## 2020-03-16 NOTE — Telephone Encounter (Signed)
-----   Message from Larey Seat, MD sent at 03/15/2020  5:17 PM EDT ----- Summary & Diagnosis:   There is very mild sleep apnea noted here- the AHI is only 5.5/h,  with NREM AHI of only 3.7/h and REM AHI of 11.7/h. there is no  clinically relevant hypoxia associated and the heart rate trend  is towards bradycardia.    Recommendations:    Given the very high daytime sleepiness score of Epworth 18, I  need to first treat sleep apnea, however mild, before pursuing a  narcolepsy evaluation. The patient can use a dental device or  CPAP for at least 60 days before we re-evaluate his sleepiness  degree and initiate further work up.  Post-viral fatigue and EDS are known conditions that have  affected Covid patients.  I will initiate auto CPAP at 5-12 cm water, no EPR and mask of  patient's choice in the meantime.    Interpreting Physician: Larey Seat, MD

## 2020-03-31 DIAGNOSIS — N320 Bladder-neck obstruction: Secondary | ICD-10-CM | POA: Diagnosis not present

## 2020-04-23 ENCOUNTER — Encounter: Payer: Self-pay | Admitting: Family Medicine

## 2020-04-23 ENCOUNTER — Other Ambulatory Visit: Payer: Self-pay

## 2020-04-23 ENCOUNTER — Ambulatory Visit (INDEPENDENT_AMBULATORY_CARE_PROVIDER_SITE_OTHER): Payer: Medicare Other | Admitting: Family Medicine

## 2020-04-23 VITALS — BP 119/79 | HR 68 | Temp 96.8°F | Ht 74.0 in | Wt 215.6 lb

## 2020-04-23 DIAGNOSIS — C61 Malignant neoplasm of prostate: Secondary | ICD-10-CM

## 2020-04-23 DIAGNOSIS — G4733 Obstructive sleep apnea (adult) (pediatric): Secondary | ICD-10-CM | POA: Insufficient documentation

## 2020-04-23 DIAGNOSIS — R5382 Chronic fatigue, unspecified: Secondary | ICD-10-CM | POA: Diagnosis not present

## 2020-04-23 DIAGNOSIS — I480 Paroxysmal atrial fibrillation: Secondary | ICD-10-CM | POA: Diagnosis not present

## 2020-04-23 DIAGNOSIS — G4730 Sleep apnea, unspecified: Secondary | ICD-10-CM

## 2020-04-23 NOTE — Progress Notes (Signed)
Subjective: CC: f/u fatigue PCP: [name], DO Jordan [name] is a 74 y.o. male presenting to clinic today for:  1. Fatigue Had a home sleep test which showed mild sleep apnea.  He was prescribed an Auto titrating CPAP. Patient is he has not gotten the CPAP machine because of distribution difficulties. He does note fatigue seems to be getting better though.  2. Prostate cancer Even though his prostate cancer is slow-growing, patient notes that he will be undergoing robotic prostatectomy with Dr. Hal Neer sometime after Christmas. He continues to have urinary frequency and an enlarged prostate despite use of Proscar. This is interfering with his quality of life.  3. Atrial fibrillation Patient is compliant with Eliquis 5 mg twice daily, Cardizem daily. He follows up closely with his cardiologist and has an appointment in January. No hematochezia, melena, hematuria or epistaxis. No chest pain or shortness of breath.   ROS: Per HPI  Allergies  Allergen Reactions   Levofloxacin Other (See Comments)    Couldn't breathe, passed out   Lopid [Gemfibrozil] Other (See Comments)    Loose bowels   Sulfonamide Derivatives Hives   Past Medical History:  Diagnosis Date   Anxiety    Mild   Atrial fibrillation (HCC)    Paroxysmal   BPH (benign prostatic hyperplasia)    CAD (coronary artery disease)    Stent LAD, 2002  /  nuclear 2009, no ischemia  /    Hospital October, 2011 nuclear, no ischemia, possible slight apical scar, ejection fraction 70%   Carotid bruit    Doppler November, 2011, normal   Chest pain 02/23/2013   Chronic anticoagulation    Low CHADS , score, but patient wants to be aggressive   Colon polyps    Drug intolerance    Mild beta blocker intolerance   Dyslipidemia    Ejection fraction    EF 65-70%, echo, 2007 /  EF 70%, nuclear, 2011   Hyperlipidemia    IBS (irritable bowel syndrome)    Incomplete RBBB    Knee pain    left    Personal history of colonic adenomas 11/29/2006   Qualifier: Diagnosis of  By: Danny Lawless CMA, [place]     Syncope    November, 2010    Current Outpatient Medications:    atorvastatin (LIPITOR) 80 MG tablet, TAKE 1 TABLET DAILY AT 6PM, Disp: 90 tablet, Rfl: 3   Calcium Carb-Cholecalciferol (CALCIUM 600+D3 PO), Take 1 capsule by mouth daily. , Disp: , Rfl:    carboxymethylcellulose (REFRESH PLUS) 0.5 % SOLN, Place 1 drop into both eyes 3 (three) times daily as needed. For dry eyes, Disp: , Rfl:    Cholecalciferol (VITAMIN D3) 1000 UNITS CAPS, Take 1,000 Units by mouth daily. , Disp: , Rfl:    diltiazem (CARDIZEM CD) 180 MG 24 hr capsule, Take 1 capsule (180 mg total) by mouth daily., Disp: 90 capsule, Rfl: 0   diltiazem (CARDIZEM) 60 MG tablet, Take 1 tablet (60 mg total) by mouth as needed., Disp: 90 tablet, Rfl: 1   ELIQUIS 5 MG TABS tablet, TAKE  (1)  TABLET TWICE A DAY., Disp: 180 tablet, Rfl: 0   finasteride (PROSCAR) 5 MG tablet, Take by mouth., Disp: , Rfl:    fish oil-omega-3 fatty acids 1000 MG capsule, Take 1 g by mouth daily. , Disp: , Rfl:    Glucosamine-Chondroitin 1500-1200 MG/30ML LIQD, Take 1,500 mg by mouth daily. , Disp: , Rfl:    Multiple Vitamins-Minerals (MULTIVITAMIN WITH  MINERALS) tablet, Take 1 tablet by mouth daily. , Disp: , Rfl:    nitroGLYCERIN (NITROSTAT) 0.4 MG SL tablet, Place 1 tablet (0.4 mg total) under the tongue every 5 (five) minutes as needed for chest pain., Disp: 25 tablet, Rfl: 3   Probiotic Product (ALIGN PO), Take 1 tablet by mouth daily. As needed, Disp: , Rfl:  Social History   Socioeconomic History   Marital status: Married    Spouse name: [name]    Number of children: 3   Years of education: bachelors   Highest education level: Not on file  Occupational History   Occupation: Development worker, international aid    Comment: Makes dental appliances  Tobacco Use   Smoking status: Never Smoker   Smokeless tobacco: Never Used  Brewing technologist Use: Never used  Substance and Sexual Activity   Alcohol use: No   Drug use: No   Sexual activity: Yes  Other Topics Concern   Not on file  Social History Narrative   Married, he has children and grandchildren.   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   Social Determinants of Health   Financial Resource Strain:    Difficulty of Paying Living Expenses: Not on file  Food Insecurity:    Worried About Charity fundraiser in the Last Year: Not on file   [employer] of Food in the Last Year: Not on file  Transportation Needs:    Lack of Transportation (Medical): Not on file   Lack of Transportation (Non-Medical): Not on file  Physical Activity:    Days of Exercise per Week: Not on file   Minutes of Exercise per Session: Not on file  Stress:    Feeling of Stress : Not on file  Social Connections:    Frequency of Communication with Friends and Family: Not on file   Frequency of Social Gatherings with Friends and Family: Not on file   Attends Religious Services: Not on file   Active Member of Clubs or Organizations: Not on file   Attends Archivist Meetings: Not on file   Marital Status: Not on file  Intimate Partner Violence:    Fear of Current or Ex-Partner: Not on file   Emotionally Abused: Not on file   Physically Abused: Not on file   Sexually Abused: Not on file   Family History  Problem Relation Age of Onset   Heart attack Mother    Diabetes Mother    Heart attack Brother    Heart disease Brother    Stroke Maternal Grandfather    Stroke Paternal Grandfather    Macular degeneration Sister    Cancer Daughter        colon    Obesity Sister    Paranoid behavior Sister    Cancer Sister        lung - uterus    COPD Sister    Colon cancer Neg Hx     Objective: Office vital signs reviewed. BP 119/79    Pulse 68    Temp (!) 96.8 F (36 C)    Ht 6\' 2"  (1.88 m)    Wt 215 lb 9.6 oz  (97.8 kg)    SpO2 98%    BMI 27.68 kg/m   Physical Examination:  General: Awake, alert, well nourished, No acute distress HEENT: Normal; sclera white. Moist mucous membranes Cardio: regular rate and rhythm, S1S2 heard, no murmurs appreciated Pulm: clear to  auscultation bilaterally, no wheezes, rhonchi or rales; normal work of breathing on room air Extremities: warm, well perfused, No edema, cyanosis or clubbing; +2 pulses bilaterally MSK: normal gait and station Psych: Mood stable, affect normal, affect appropriate. Patient is pleasant and interactive. He is very conversive.  Assessment/ Plan: 74 y.o. male   1. Paroxysmal atrial fibrillation (HCC) Rate and rhythm controlled today. Fortunately had no samples of the Eliquis. I have encouraged him to contact us back intervally for these  2. Malignant neoplasm prostate [facility]) Plan for robotic prostatectomy after the new year. We'll continue to follow along with urology's recommendations  3. Chronic fatigue Improving. Has not yet started CPAP machine.  4. Mild sleep apnea Trial of CPAP as above   No orders of the defined types were placed in this encounter.  No orders of the defined types were placed in this encounter.    [name], DO [place] [phone]

## 2020-05-04 ENCOUNTER — Other Ambulatory Visit: Payer: Self-pay | Admitting: Family Medicine

## 2020-05-17 ENCOUNTER — Telehealth: Payer: Self-pay | Admitting: Neurology

## 2020-05-17 NOTE — Telephone Encounter (Addendum)
Called the patient back and we discussed the difference between the RES MED brand machine and the Luna machine. The patient is scheduled to go in and possibly be set up on the [place]. I advised that Dr Brett Fairy is aware of the [facility] machines and has approved the use of these machines for our patients with mild apnea such as himself. Pt will move forward with apt with the company. I went ahead and pushed the initial cpap visit out further since undecided whether he will start with that machine or not. Pt will keep [place] posted.

## 2020-05-17 NOTE — Telephone Encounter (Signed)
Pt. is asking should he wait for CPAP machine that Dr. suggested as [facility] has stated it may be a few weeks until that specific one is ready. He's asking if he should use what is readily available or just wait, as he doesn't want to settle for just any machine. Please advise.

## 2020-06-09 ENCOUNTER — Telehealth: Payer: Self-pay

## 2020-06-09 NOTE — Telephone Encounter (Signed)
ERROR

## 2020-06-12 HISTORY — PX: XI ROBOTIC ASSISTED SIMPLE PROSTATECTOMY: SHX6713

## 2020-06-19 ENCOUNTER — Other Ambulatory Visit: Payer: Self-pay | Admitting: Family Medicine

## 2020-06-19 DIAGNOSIS — I480 Paroxysmal atrial fibrillation: Secondary | ICD-10-CM

## 2020-06-21 ENCOUNTER — Other Ambulatory Visit: Payer: Self-pay

## 2020-06-21 ENCOUNTER — Encounter: Payer: Self-pay | Admitting: Cardiology

## 2020-06-21 ENCOUNTER — Ambulatory Visit: Payer: Medicare Other | Admitting: Cardiology

## 2020-06-21 VITALS — BP 112/70 | HR 63 | Ht 74.0 in | Wt 215.0 lb

## 2020-06-21 DIAGNOSIS — I480 Paroxysmal atrial fibrillation: Secondary | ICD-10-CM

## 2020-06-21 DIAGNOSIS — I2510 Atherosclerotic heart disease of native coronary artery without angina pectoris: Secondary | ICD-10-CM | POA: Diagnosis not present

## 2020-06-21 MED ORDER — DILTIAZEM HCL 60 MG PO TABS
60.0000 mg | ORAL_TABLET | ORAL | 1 refills | Status: DC | PRN
Start: 2020-06-21 — End: 2021-04-20

## 2020-06-21 MED ORDER — NITROGLYCERIN 0.4 MG SL SUBL: 0.4000 mg | SUBLINGUAL_TABLET | SUBLINGUAL | 3 refills | Status: DC | PRN

## 2020-06-21 MED ORDER — APIXABAN 5 MG PO TABS: ORAL_TABLET | ORAL | 1 refills | Status: DC

## 2020-06-21 NOTE — Progress Notes (Signed)
Cardiology Office Note:    Date:  06/21/2020   ID:  Jordan [name], DOB [birth year], MRN [id number]  PCP:  [name], DO  [facility] Cardiologist:  Candee Furbish, MD  [facility] Electrophysiologist:  None   Referring MD: [name], DO     History of Present Illness:    Jordan [name] is a 75 y.o. male here for the follow-up of coronary artery disease, paroxysmal atrial fibrillation, prior COVID infection.  Back in 2002 had an LAD stent placed. Nuclear stress test 2011 showed a small possible apical scar.  EF was normal.  Has varicose veins, wear support hose.  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  Has 7 grandchildren.  Takes them on a ski trip every year.  Previously after COVID in December 2020 had some "mind fog ".  Felt wiped out.  He has slowly increased his exercise tolerance.  Doing much better now.  Still battling with tinnitus.  He is also battling with nocturia.  Having a prostatectomy soon.  Has occasional dizziness when standing up too quickly in the middle of the night.  Instructed him to be careful and to allow his body to calibrate.    Past Medical History:  Diagnosis Date  . Anxiety    Mild  . Atrial fibrillation (HCC)    Paroxysmal  . BPH (benign prostatic hyperplasia)   . CAD (coronary artery disease)    Stent LAD, 2002  /  nuclear 2009, no ischemia  /    [facility] October, 2011 nuclear, no ischemia, possible slight apical scar, ejection fraction 70%  . Carotid bruit    Doppler November, 2011, normal  . Chest pain 02/23/2013  . Chronic anticoagulation    Low CHADS , score, but patient wants to be aggressive  . Colon polyps   . Drug intolerance    Mild beta blocker intolerance  . Dyslipidemia   . Ejection fraction    EF 65-70%, echo, 2007 /  EF 70%, nuclear, 2011  . Hyperlipidemia   . IBS (irritable bowel syndrome)   . Incomplete RBBB   . Knee pain    left  . Personal history  of colonic adenomas 11/29/2006   Qualifier: Diagnosis of  By: [place], [place]    . Syncope    November, 2010    Past Surgical History:  Procedure Laterality Date  . BACK SURGERY  05/1990   [id number]  . CARDIAC CATHETERIZATION    . COLONOSCOPY  multiple    Current Medications: Current Meds  Medication Sig  . atorvastatin (LIPITOR) 80 MG tablet TAKE 1 TABLET DAILY AT 6PM  . Calcium Carb-Cholecalciferol (CALCIUM 600+D3 PO) Take 1 capsule by mouth daily.  . carboxymethylcellulose (REFRESH PLUS) 0.5 % SOLN Place 1 drop into both eyes 3 (three) times daily as needed. For dry eyes  . Cholecalciferol (VITAMIN D3) 1000 UNITS CAPS Take 1,000 Units by mouth daily.  Marland Kitchen diltiazem (CARDIZEM CD) 180 MG 24 hr capsule Take 1 capsule (180 mg total) by mouth daily.  . finasteride (PROSCAR) 5 MG tablet Take by mouth.  . fish oil-omega-3 fatty acids 1000 MG capsule Take 1 g by mouth daily.  . Glucosamine-Chondroitin 1500-1200 MG/30ML LIQD Take 1,500 mg by mouth daily.  . Multiple Vitamins-Minerals (MULTIVITAMIN WITH MINERALS) tablet Take 1 tablet by mouth daily.  . Probiotic Product (ALIGN PO) Take 1 tablet by mouth daily. As needed  . [DISCONTINUED] diltiazem (CARDIZEM) 60  MG tablet Take 1 tablet (60 mg total) by mouth as needed.  . [DISCONTINUED] ELIQUIS 5 MG TABS tablet TAKE  (1)  TABLET TWICE A DAY.  . [DISCONTINUED] nitroGLYCERIN (NITROSTAT) 0.4 MG SL tablet Place 1 tablet (0.4 mg total) under the tongue every 5 (five) minutes as needed for chest pain.     Allergies:   Levofloxacin, Lopid [gemfibrozil], and Sulfonamide derivatives   Social History   Socioeconomic History  . Marital status: Married    Spouse name: Jordan [name]   . Number of children: 3  . Years of education: bachelors  . Highest education level: Not on file  Occupational History  . Occupation: Dental Laboratory    Comment: Makes dental appliances  Tobacco Use  . Smoking status: Never Smoker  . Smokeless tobacco: Never Used   Vaping Use  . Vaping Use: Never used  Substance and Sexual Activity  . Alcohol use: No  . Drug use: No  . Sexual activity: Yes  Other Topics Concern  . Not on file  Social History Narrative   Married, he has children and grandchildren.   L[place] [address] acres, raises chickens, works as a Neurosurgeon    11/23/2015   Social Determinants of Health   Financial Resource Strain: Not on file  Food Insecurity: Not on file  Transportation Needs: Not on file  Physical Activity: Not on file  Stress: Not on file  Social Connections: Not on file     Family History: The patient's family history includes COPD in his sister; Cancer in his daughter and sister; Diabetes in his mother; Heart attack in his brother and mother; Heart disease in his brother; Macular degeneration in his sister; Obesity in his sister; Paranoid behavior in his sister; Stroke in his maternal grandfather and paternal grandfather. There is no history of Colon cancer.  ROS:   Please see the history of present illness.     All other systems reviewed and are negative.  EKGs/Labs/Other Studies Reviewed:    The following studies were reviewed today:  Exercise treadmill test 10/05/2015-low risk 9 minutes. Echocardiogram 02/24/2013:  - Left ventricle: The cavity size was normal. Wall thickness  was normal. Systolic function was normal. The estimated  ejection fraction was in the range of 60% to 65%. Wall  motion was normal; there were no regional wall motion  abnormalities. Left ventricular diastolic function  parameters were normal.  - Right ventricle: The cavity size was mildly dilated.  - Right atrium: The atrium was mildly dilated.    EKG:  EKG is  ordered today.  The ekg ordered today demonstrates SR 63.  Recent Labs: 12/22/2019: ALT 31; BUN 19; Creatinine, Ser 1.05; Hemoglobin 15.6; Platelets 254; Potassium 4.7; Sodium 139; TSH 3.350  Recent Lipid Panel    Component Value Date/Time   CHOL 138  12/22/2019 1115   CHOL 125 03/06/2013 0821   TRIG 140 12/22/2019 1115   TRIG 95 05/28/2017 1247   TRIG 73 03/06/2013 0821   HDL 34 (L) 12/22/2019 1115   HDL 35 (L) 05/28/2017 1247   HDL 40 03/06/2013 0821   CHOLHDL 4.1 12/22/2019 1115   CHOLHDL 2.8 02/24/2013 0900   VLDL 16 02/24/2013 0900   LDLCALC 79 12/22/2019 1115   LDLCALC 68 11/18/2013 0804   LDLCALC 70 03/06/2013 0821      Physical Exam:    VS:  BP 112/70   Pulse 63   Ht 6\' 2"  (1.88 m)   Wt 215 lb (97.5 kg)  BMI 27.60 kg/m     Wt Readings from Last 3 Encounters:  06/21/20 215 lb (97.5 kg)  04/23/20 215 lb 9.6 oz (97.8 kg)  01/21/20 217 lb (98.4 kg)     GEN:  Well nourished, well developed in no acute distress HEENT: Normal NECK: No JVD; No carotid bruits LYMPHATICS: No lymphadenopathy CARDIAC: RRR, no murmurs, rubs, gallops RESPIRATORY:  Clear to auscultation without rales, wheezing or rhonchi  ABDOMEN: Soft, non-tender, non-distended MUSCULOSKELETAL:  No edema; No deformity  SKIN: Warm and dry NEUROLOGIC:  Alert and oriented x 3 PSYCHIATRIC:  Normal affect   ASSESSMENT:    1. Coronary artery disease involving native coronary artery of native heart without angina pectoris   2. Paroxysmal atrial fibrillation (HCC)    PLAN:    In order of problems listed above:  Preop cardiovascular -Dr. Hal Neer - prostatectomy. 07/29/19. Robotic.  He is okay to proceed with low overall cardiac risk.  He is able to complete greater than 4 METS of activity without difficulty.  He may hold his Eliquis for 3 days prior to reduce bleeding risks.  Resume per urology.  Paroxysmal atrial fibrillation - On diltiazem CD1 180 mg once a day.  Has 60 mg tablets to take as needed.  Overall doing very well in sinus rhythm. --Had 3 episodes last year.   Chronic anticoagulation - On Eliquis.  No changes no bleeding.  Continue to monitor hemoglobin as well as creatinine.  OSA --marginal. Waiting for machine.   Coronary artery  disease status post LAD stent in 2002 - Treadmill test in 2017 was low risk at 9 minutes.  Prior to that a nuclear stress test showed a small apical defect.  Low risk.  Continuing with Eliquis and no aspirin.  No anginal symptoms.  Carotid bruit - Normal carotid Doppler in 2011.  Difficult to appreciate on exam  Hyperlipidemia - On atorvastatin 40 mg.  LDL most recently 79.  Tinnitus --[place], ENT. High pitch ringing      Medication Adjustments/Labs and Tests Ordered: Current medicines are reviewed at length with the patient today.  Concerns regarding medicines are outlined above.  Orders Placed This Encounter  Procedures  . EKG 12-Lead   Meds ordered this encounter  Medications  . diltiazem (CARDIZEM) 60 MG tablet    Sig: Take 1 tablet (60 mg total) by mouth as needed.    Dispense:  90 tablet    Refill:  1  . apixaban (ELIQUIS) 5 MG TABS tablet    Sig: TAKE  (1)  TABLET TWICE A DAY.    Dispense:  180 tablet    Refill:  1  . nitroGLYCERIN (NITROSTAT) 0.4 MG SL tablet    Sig: Place 1 tablet (0.4 mg total) under the tongue every 5 (five) minutes as needed for chest pain.    Dispense:  25 tablet    Refill:  3    Patient Instructions  Medication Instructions:  The current medical regimen is effective;  continue present plan and medications.  *If you need a refill on your cardiac medications before your next appointment, please call your pharmacy*  Follow-Up: At [facility], you and your health needs are our priority.  As part of our continuing mission to provide you with exceptional heart care, we have created designated Provider Care Teams.  These Care Teams include your primary Cardiologist (physician) and Advanced Practice Providers (APPs -  Physician Assistants and Nurse Practitioners) who all work together to provide you with the care  you need, when you need it.  We recommend signing up for the patient portal called "MyChart".  Sign up information is provided on this  After Visit Summary.  MyChart is used to connect with patients for Virtual Visits (Telemedicine).  Patients are able to view lab/test results, encounter notes, upcoming appointments, etc.  Non-urgent messages can be sent to your provider as well.   To learn more about what you can do with MyChart, go to [email or web address].    Your next appointment:   12 month(s)  The format for your next appointment:   In Person  Provider:   Candee Furbish, MD   Thank you for choosing [facility]!!    You may proceed with your upcoming surgery as planned.  You may hold the Eliqius 3 days before your surgery and restart as soon as the surgeon deems it safe.     Signed, Candee Furbish, MD  06/21/2020 12:10 PM    [facility]

## 2020-06-21 NOTE — Patient Instructions (Addendum)
Medication Instructions:  The current medical regimen is effective;  continue present plan and medications.  *If you need a refill on your cardiac medications before your next appointment, please call your pharmacy*  Follow-Up: At [facility], you and your health needs are our priority.  As part of our continuing mission to provide you with exceptional heart care, we have created designated Provider Care Teams.  These Care Teams include your primary Cardiologist (physician) and Advanced Practice Providers (APPs -  Physician Assistants and Nurse Practitioners) who all work together to provide you with the care you need, when you need it.  We recommend signing up for the patient portal called "MyChart".  Sign up information is provided on this After Visit Summary.  MyChart is used to connect with patients for Virtual Visits (Telemedicine).  Patients are able to view lab/test results, encounter notes, upcoming appointments, etc.  Non-urgent messages can be sent to your provider as well.   To learn more about what you can do with MyChart, go to [email or web address].    Your next appointment:   12 month(s)  The format for your next appointment:   In Person  Provider:   Candee Furbish, MD   Thank you for choosing [facility]!!    You may proceed with your upcoming surgery as planned.  You may hold the Eliqius 3 days before your surgery and restart as soon as the surgeon deems it safe.

## 2020-06-22 ENCOUNTER — Ambulatory Visit: Payer: Self-pay | Admitting: Neurology

## 2020-06-22 DIAGNOSIS — N138 Other obstructive and reflux uropathy: Secondary | ICD-10-CM | POA: Diagnosis not present

## 2020-07-21 DIAGNOSIS — I2510 Atherosclerotic heart disease of native coronary artery without angina pectoris: Secondary | ICD-10-CM | POA: Diagnosis not present

## 2020-07-21 DIAGNOSIS — Z7901 Long term (current) use of anticoagulants: Secondary | ICD-10-CM | POA: Diagnosis not present

## 2020-07-21 DIAGNOSIS — Z955 Presence of coronary angioplasty implant and graft: Secondary | ICD-10-CM | POA: Diagnosis not present

## 2020-07-21 DIAGNOSIS — I4891 Unspecified atrial fibrillation: Secondary | ICD-10-CM | POA: Diagnosis not present

## 2020-07-21 DIAGNOSIS — N138 Other obstructive and reflux uropathy: Secondary | ICD-10-CM | POA: Diagnosis not present

## 2020-07-21 DIAGNOSIS — G4733 Obstructive sleep apnea (adult) (pediatric): Secondary | ICD-10-CM | POA: Diagnosis not present

## 2020-07-21 DIAGNOSIS — Z01818 Encounter for other preprocedural examination: Secondary | ICD-10-CM | POA: Diagnosis not present

## 2020-07-21 DIAGNOSIS — Z79899 Other long term (current) drug therapy: Secondary | ICD-10-CM | POA: Diagnosis not present

## 2020-07-28 DIAGNOSIS — I2510 Atherosclerotic heart disease of native coronary artery without angina pectoris: Secondary | ICD-10-CM | POA: Diagnosis not present

## 2020-07-28 DIAGNOSIS — G4733 Obstructive sleep apnea (adult) (pediatric): Secondary | ICD-10-CM | POA: Diagnosis not present

## 2020-07-28 DIAGNOSIS — I4891 Unspecified atrial fibrillation: Secondary | ICD-10-CM | POA: Diagnosis not present

## 2020-07-28 DIAGNOSIS — N138 Other obstructive and reflux uropathy: Secondary | ICD-10-CM | POA: Diagnosis not present

## 2020-07-28 DIAGNOSIS — Z955 Presence of coronary angioplasty implant and graft: Secondary | ICD-10-CM | POA: Diagnosis not present

## 2020-07-28 DIAGNOSIS — Z7901 Long term (current) use of anticoagulants: Secondary | ICD-10-CM | POA: Diagnosis not present

## 2020-07-28 DIAGNOSIS — N320 Bladder-neck obstruction: Secondary | ICD-10-CM | POA: Diagnosis not present

## 2020-07-28 DIAGNOSIS — Z79899 Other long term (current) drug therapy: Secondary | ICD-10-CM | POA: Diagnosis not present

## 2020-07-29 DIAGNOSIS — N138 Other obstructive and reflux uropathy: Secondary | ICD-10-CM | POA: Diagnosis not present

## 2020-07-29 DIAGNOSIS — I2510 Atherosclerotic heart disease of native coronary artery without angina pectoris: Secondary | ICD-10-CM | POA: Diagnosis not present

## 2020-07-29 DIAGNOSIS — Z955 Presence of coronary angioplasty implant and graft: Secondary | ICD-10-CM | POA: Diagnosis not present

## 2020-07-29 DIAGNOSIS — Z7901 Long term (current) use of anticoagulants: Secondary | ICD-10-CM | POA: Diagnosis not present

## 2020-07-29 DIAGNOSIS — Z79899 Other long term (current) drug therapy: Secondary | ICD-10-CM | POA: Diagnosis not present

## 2020-07-29 DIAGNOSIS — I4891 Unspecified atrial fibrillation: Secondary | ICD-10-CM | POA: Diagnosis not present

## 2020-07-29 DIAGNOSIS — G4733 Obstructive sleep apnea (adult) (pediatric): Secondary | ICD-10-CM | POA: Diagnosis not present

## 2020-08-06 DIAGNOSIS — Z466 Encounter for fitting and adjustment of urinary device: Secondary | ICD-10-CM | POA: Diagnosis not present

## 2020-08-11 ENCOUNTER — Ambulatory Visit: Payer: Self-pay | Admitting: Adult Health

## 2020-09-09 DIAGNOSIS — G4733 Obstructive sleep apnea (adult) (pediatric): Secondary | ICD-10-CM | POA: Diagnosis not present

## 2020-09-20 ENCOUNTER — Other Ambulatory Visit: Payer: Self-pay | Admitting: Family Medicine

## 2020-10-07 DIAGNOSIS — G44219 Episodic tension-type headache, not intractable: Secondary | ICD-10-CM | POA: Diagnosis not present

## 2020-10-07 DIAGNOSIS — H40033 Anatomical narrow angle, bilateral: Secondary | ICD-10-CM | POA: Diagnosis not present

## 2020-10-09 DIAGNOSIS — G4733 Obstructive sleep apnea (adult) (pediatric): Secondary | ICD-10-CM | POA: Diagnosis not present

## 2020-10-11 ENCOUNTER — Encounter: Payer: Self-pay | Admitting: Family Medicine

## 2020-10-11 ENCOUNTER — Other Ambulatory Visit: Payer: Self-pay

## 2020-10-11 ENCOUNTER — Ambulatory Visit (INDEPENDENT_AMBULATORY_CARE_PROVIDER_SITE_OTHER): Payer: Medicare Other | Admitting: Family Medicine

## 2020-10-11 VITALS — BP 103/69 | HR 74 | Ht 74.0 in | Wt 219.0 lb

## 2020-10-11 DIAGNOSIS — W57XXXA Bitten or stung by nonvenomous insect and other nonvenomous arthropods, initial encounter: Secondary | ICD-10-CM

## 2020-10-11 DIAGNOSIS — S70362A Insect bite (nonvenomous), left thigh, initial encounter: Secondary | ICD-10-CM | POA: Diagnosis not present

## 2020-10-11 MED ORDER — DOXYCYCLINE HYCLATE 100 MG PO TABS: 100.0000 mg | ORAL_TABLET | Freq: Two times a day (BID) | ORAL | 0 refills | Status: DC

## 2020-10-11 NOTE — Progress Notes (Signed)
BP 103/69   Pulse 74   Ht 6\' 2"  (1.88 m)   Wt 219 lb (99.3 kg)   SpO2 97%   BMI 28.12 kg/m    Subjective:   Patient ID: Jordan [name], male    DOB: 12/12/1945, 75 y.o.   MRN: [id number]  HPI: Jordan [name] is a 75 y.o. male presenting on 10/11/2020 for No chief complaint on file.   HPI Patient is coming in for tick bite on his left inner thigh that he noticed on Saturday, he removed the tick and it was a small tick and normally gets ticks but does not have a reaction like he has what started to swell up and get red and starting to expand even more he says every day the redness and swelling is spreading more on that part of his leg.  He denies any fevers or chills or joint aches or rash anywhere else.  Relevant past medical, surgical, family and social history reviewed and updated as indicated. Interim medical history since our last visit reviewed. Allergies and medications reviewed and updated.  Review of Systems  Constitutional: Negative for chills and fever.  Respiratory: Negative for shortness of breath and wheezing.   Cardiovascular: Negative for chest pain and leg swelling.  Skin: Positive for color change and rash. Negative for wound.  All other systems reviewed and are negative.   Per HPI unless specifically indicated above   Allergies as of 10/11/2020      Reactions   Levofloxacin Other (See Comments)   Couldn't breathe, passed out   Lopid [gemfibrozil] Other (See Comments)   Loose bowels   Sulfonamide Derivatives Hives      Medication List       Accurate as of Oct 11, 2020  9:16 AM. If you have any questions, ask your nurse or doctor.        STOP taking these medications   finasteride 5 MG tablet Commonly known as: PROSCAR Stopped by: Fransisca Kaufmann Chance Munter, MD     TAKE these medications   ALIGN PO Take 1 tablet by mouth daily. As needed   apixaban 5 MG Tabs tablet Commonly known as: Eliquis TAKE  (1)  TABLET TWICE A DAY.   atorvastatin 80 MG  tablet Commonly known as: LIPITOR TAKE 1 TABLET DAILY AT 6PM  (NEEDS TO BE SEEN BEFORE NEXT REFILL)   CALCIUM 600+D3 PO Take 1 capsule by mouth daily.   carboxymethylcellulose 0.5 % Soln Commonly known as: REFRESH PLUS Place 1 drop into both eyes 3 (three) times daily as needed. For dry eyes   diltiazem 180 MG 24 hr capsule Commonly known as: CARDIZEM CD Take 1 capsule (180 mg total) by mouth daily.   diltiazem 60 MG tablet Commonly known as: CARDIZEM Take 1 tablet (60 mg total) by mouth as needed.   fish oil-omega-3 fatty acids 1000 MG capsule Take 1 g by mouth daily.   Glucosamine-Chondroitin 1500-1200 MG/30ML Liqd Take 1,500 mg by mouth daily.   multivitamin with minerals tablet Take 1 tablet by mouth daily.   nitroGLYCERIN 0.4 MG SL tablet Commonly known as: NITROSTAT Place 1 tablet (0.4 mg total) under the tongue every 5 (five) minutes as needed for chest pain.   Vitamin D3 25 MCG (1000 UT) Caps Take 1,000 Units by mouth daily.        Objective:   BP 103/69   Pulse 74   Ht 6\' 2"  (1.88 m)   Wt 219 lb (99.3 kg)  SpO2 97%   BMI 28.12 kg/m   Wt Readings from Last 3 Encounters:  10/11/20 219 lb (99.3 kg)  06/21/20 215 lb (97.5 kg)  04/23/20 215 lb 9.6 oz (97.8 kg)    Physical Exam Vitals and nursing note reviewed.  Constitutional:      Appearance: Normal appearance.  Skin:    Findings: Erythema and rash present. No wound. Rash is macular. Rash is not crusting.       Neurological:     Mental Status: He is alert.       Assessment & Plan:   Problem List Items Addressed This Visit   None   Visit Diagnoses    Tick bite, initial encounter    -  Primary   Relevant Medications   doxycycline (VIBRA-TABS) 100 MG tablet      Left inner thigh tick bite show signs of swelling with redness and expanding, will treat with possible Lyme's lesion does not appear targetoid but it could be heading that direction Follow up plan: Return if symptoms worsen  or fail to improve.  Counseling provided for all of the vaccine components No orders of the defined types were placed in this encounter.   Caryl Pina, MD [facility] 10/11/2020, 9:16 AM

## 2020-10-19 ENCOUNTER — Ambulatory Visit (INDEPENDENT_AMBULATORY_CARE_PROVIDER_SITE_OTHER): Payer: Medicare Other | Admitting: Family Medicine

## 2020-10-19 ENCOUNTER — Encounter: Payer: Self-pay | Admitting: Family Medicine

## 2020-10-19 ENCOUNTER — Other Ambulatory Visit: Payer: Self-pay

## 2020-10-19 VITALS — BP 112/70 | HR 72 | Temp 98.1°F | Ht 74.0 in | Wt 215.0 lb

## 2020-10-19 DIAGNOSIS — C61 Malignant neoplasm of prostate: Secondary | ICD-10-CM | POA: Diagnosis not present

## 2020-10-19 DIAGNOSIS — Z0001 Encounter for general adult medical examination with abnormal findings: Secondary | ICD-10-CM

## 2020-10-19 DIAGNOSIS — I480 Paroxysmal atrial fibrillation: Secondary | ICD-10-CM

## 2020-10-19 DIAGNOSIS — Z9889 Other specified postprocedural states: Secondary | ICD-10-CM

## 2020-10-19 DIAGNOSIS — Z0000 Encounter for general adult medical examination without abnormal findings: Secondary | ICD-10-CM

## 2020-10-19 DIAGNOSIS — H9313 Tinnitus, bilateral: Secondary | ICD-10-CM

## 2020-10-19 DIAGNOSIS — Z7901 Long term (current) use of anticoagulants: Secondary | ICD-10-CM | POA: Diagnosis not present

## 2020-10-19 DIAGNOSIS — E559 Vitamin D deficiency, unspecified: Secondary | ICD-10-CM | POA: Diagnosis not present

## 2020-10-19 DIAGNOSIS — E782 Mixed hyperlipidemia: Secondary | ICD-10-CM | POA: Diagnosis not present

## 2020-10-19 DIAGNOSIS — G4733 Obstructive sleep apnea (adult) (pediatric): Secondary | ICD-10-CM | POA: Diagnosis not present

## 2020-10-19 DIAGNOSIS — Z9989 Dependence on other enabling machines and devices: Secondary | ICD-10-CM | POA: Diagnosis not present

## 2020-10-19 MED ORDER — ATORVASTATIN CALCIUM 80 MG PO TABS: ORAL_TABLET | ORAL | 3 refills | Status: DC

## 2020-10-19 NOTE — Progress Notes (Signed)
Jordan [name] is a 75 y.o. male presents to office today for annual physical exam examination.    Concerns today include: 1.  Prostate cancer Patient status post partial prostatectomy in February with Dr. Greer Pickerel at [facility].  He had a nonaggressive prostate cancer.  He has not yet had postop PSA but plans to see his specialist in June.  Apparently Dr.Pathak has since gone to [facility] but he will be having a replacement urologist soon.  He does report ongoing urinary incontinence but overall his flow has been good.  He wears a depends at nighttime to help with the incontinence.  2.  Obstructive sleep apnea/atrial fibrillation/hyperlipidemia Patient currently treated with CPAP machine.  He has been on this for the last 3 weeks.  He did have 1 run of atrial fibrillation and apparently has been having increased events on his CPAP machine.  He is not yet reached out to his sleep specialist about adjusting the settings but plans to do so soon.  He is compliant with his Cardizem, Eliquis and Lipitor.  He stopped taking the fish oils in January due to use of Eliquis.  He was worried about increased bleeding.  No reports of chest pain, bleeding episodes  3.  Tinnitus Patient continues to struggle with this status post COVID infection.  He has been seen by audiology and was determined to have normal hearing loss for his age.  He has considered specialized hearing aids to help with the above but has not yet pursued them due to cost.  He uses a white noise machine but again symptoms are not totally controlled by this.  He has considered CBT  Occupation: retired, Marital status: married, Substance use: none Diet: balanced, Exercise: stays active Last eye exam: UTD Last dental exam: UTD Last colonoscopy: UTD Refills needed today: Lipitor Immunizations needed:  Immunization History  Administered Date(s) Administered  . Influenza,inj,Quad PF,6+ Mos 02/24/2013, 03/28/2014, 03/26/2015,  03/10/2017, 03/26/2018  . Influenza-Unspecified 03/25/2016, 03/26/2018, 03/13/2019  . PFIZER(Purple Top)SARS-COV-2 Vaccination 07/08/2019, 07/29/2019, 03/11/2020, 09/17/2020  . Pneumococcal Conjugate-13 11/07/2013  . Pneumococcal Polysaccharide-23 10/16/2012  . Tdap 11/28/2016  . Zoster Recombinat (Shingrix) 10/11/2017, 12/10/2017     Past Medical History:  Diagnosis Date  . Anxiety    Mild  . Atrial fibrillation (HCC)    Paroxysmal  . BPH (benign prostatic hyperplasia)   . CAD (coronary artery disease)    Stent LAD, 2002  /  nuclear 2009, no ischemia  /    [facility] October, 2011 nuclear, no ischemia, possible slight apical scar, ejection fraction 70%  . Carotid bruit    Doppler November, 2011, normal  . Chest pain 02/23/2013  . Chronic anticoagulation    Low CHADS , score, but patient wants to be aggressive  . Colon polyps   . Drug intolerance    Mild beta blocker intolerance  . Dyslipidemia   . Ejection fraction    EF 65-70%, echo, 2007 /  EF 70%, nuclear, 2011  . Hyperlipidemia   . IBS (irritable bowel syndrome)   . Incomplete RBBB   . Knee pain    left  . Personal history of colonic adenomas 11/29/2006   Qualifier: Diagnosis of  By: [place], [place]    . Syncope    November, 2010   Social History   Socioeconomic History  . Marital status: Married    Spouse name: [name]   . Number of children: 3  . Years of education: bachelors  . Highest education  level: Not on file  Occupational History  . Occupation: Dental Laboratory    Comment: Makes dental appliances  Tobacco Use  . Smoking status: Never Smoker  . Smokeless tobacco: Never Used  Vaping Use  . Vaping Use: Never used  Substance and Sexual Activity  . Alcohol use: No  . Drug use: No  . Sexual activity: Yes  Other Topics Concern  . Not on file  Social History Narrative   Married, he has children and grandchildren.   [place], raises chickens, works as a Neurosurgeon    11/23/2015    Social Determinants of Health   Financial Resource Strain: Not on file  Food Insecurity: Not on file  Transportation Needs: Not on file  Physical Activity: Not on file  Stress: Not on file  Social Connections: Not on file  Intimate Partner Violence: Not on file   Past Surgical History:  Procedure Laterality Date  . BACK SURGERY  05/1990   [id number]  . CARDIAC CATHETERIZATION    . COLONOSCOPY  multiple  . PROSTATE SURGERY     Family History  Problem Relation Age of Onset  . Heart attack Mother   . Diabetes Mother   . Heart attack Brother   . Heart disease Brother   . Stroke Maternal Grandfather   . Stroke Paternal Grandfather   . Macular degeneration Sister   . Cancer Daughter        colon   . Obesity Sister   . Paranoid behavior Sister   . Cancer Sister        lung - uterus   . COPD Sister   . Colon cancer Neg Hx     Current Outpatient Medications:  .  apixaban (ELIQUIS) 5 MG TABS tablet, TAKE  (1)  TABLET TWICE A DAY., Disp: 180 tablet, Rfl: 1 .  atorvastatin (LIPITOR) 80 MG tablet, TAKE 1 TABLET DAILY AT 6PM  (NEEDS TO BE SEEN BEFORE NEXT REFILL), Disp: 30 tablet, Rfl: 0 .  Calcium Carb-Cholecalciferol (CALCIUM 600+D3 PO), Take 1 capsule by mouth daily., Disp: , Rfl:  .  carboxymethylcellulose (REFRESH PLUS) 0.5 % SOLN, Place 1 drop into both eyes 3 (three) times daily as needed. For dry eyes, Disp: , Rfl:  .  Cholecalciferol (VITAMIN D3) 1000 UNITS CAPS, Take 1,000 Units by mouth daily., Disp: , Rfl:  .  diltiazem (CARDIZEM CD) 180 MG 24 hr capsule, Take 1 capsule (180 mg total) by mouth daily., Disp: 90 capsule, Rfl: 3 .  diltiazem (CARDIZEM) 60 MG tablet, Take 1 tablet (60 mg total) by mouth as needed., Disp: 90 tablet, Rfl: 1 .  doxycycline (VIBRA-TABS) 100 MG tablet, Take 1 tablet (100 mg total) by mouth 2 (two) times daily. 1 po bid, Disp: 28 tablet, Rfl: 0 .  Glucosamine-Chondroitin 1500-1200 MG/30ML LIQD, Take 1,500 mg by mouth daily., Disp: , Rfl:  .   Multiple Vitamins-Minerals (MULTIVITAMIN WITH MINERALS) tablet, Take 1 tablet by mouth daily., Disp: , Rfl:  .  nitroGLYCERIN (NITROSTAT) 0.4 MG SL tablet, Place 1 tablet (0.4 mg total) under the tongue every 5 (five) minutes as needed for chest pain., Disp: 25 tablet, Rfl: 3 .  Probiotic Product (ALIGN PO), Take 1 tablet by mouth daily. As needed, Disp: , Rfl:  .  fish oil-omega-3 fatty acids 1000 MG capsule, Take 1 g by mouth daily. (Patient not taking: Reported on 10/19/2020), Disp: , Rfl:   Allergies  Allergen Reactions  . Levofloxacin Other (See Comments)  Couldn't breathe, passed out  . Lopid [Gemfibrozil] Other (See Comments)    Loose bowels  . Sulfonamide Derivatives Hives     ROS: Review of Systems Pertinent items noted in HPI and remainder of comprehensive ROS otherwise negative.    Physical exam BP 112/70   Pulse 72   Temp 98.1 F (36.7 C)   Ht 6' 2"  (1.88 m)   Wt 215 lb (97.5 kg)   SpO2 100%   BMI 27.60 kg/m  General appearance: alert, cooperative, appears stated age and no distress Head: Normocephalic, without obvious abnormality, atraumatic Eyes: negative findings: lids and lashes normal, conjunctivae and sclerae normal, corneas clear and pupils equal, round, reactive to light and accomodation Ears: normal TM's and external ear canals both ears Nose: Nares normal. Septum midline. Mucosa normal. No drainage or sinus tenderness. Throat: lips, mucosa, and tongue normal; teeth and gums normal Neck: no adenopathy, supple, symmetrical, trachea midline and thyroid not enlarged, symmetric, no tenderness/mass/nodules Back: symmetric, no curvature. ROM normal. No CVA tenderness. Lungs: clear to auscultation bilaterally Chest wall: no tenderness Heart: irregularly irregular rhythm Abdomen: soft, non-tender; bowel sounds normal; no masses,  no organomegaly and Postop surgical sites healing.  He has a slight nodular area at the superior umbilicus that is consistent with  scarring Extremities: extremities normal, atraumatic, no cyanosis or edema Pulses: 2+ and symmetric Skin: Skin color, texture, turgor normal. No rashes or lesions Lymph nodes: Cervical, supraclavicular, and axillary nodes normal. Neurologic: Alert and oriented X 3, normal strength and tone. Normal symmetric reflexes. Normal coordination and gait    Assessment/ Plan: [name] here for annual physical exam.   Annual physical exam  Paroxysmal atrial fibrillation ([facility]) - Plan: TSH, CBC  Long term (current) use of anticoagulants - Plan: CBC  Mixed hyperlipidemia - Plan: CMP14+EGFR, Lipid Panel, TSH  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[facility]) - Plan: CBC, PSA  History of prostate surgery  Tinnitus of both ears  Vitamin D deficiency - Plan: VITAMIN D 25 Hydroxy (Vit-D Deficiency, Fractures)  OSA on CPAP  Check labs.  Will CC to cardiologist, urologist (apparently current urologist is now at [facility] but should have someone in his stead)  Overall seems to be doing well from a prostate surgery standpoint.  He still has some incontinence which he plans to discuss soon with his urologist.  He did have a run of atrial fibrillation recently and he wonders if perhaps his CPAP settings need to be adjusted.  He likely has finally received his CPAP and has been utilizing it consistently for the last 3 weeks.  Has a follow-up with Dr. Brett Fairy at some point.  We talked about the utility of over-the-counter fish oil today.  He has been off of this for the last 3 months so we will check his cholesterol panel and see how it looks.  If his triglycerides are elevated will pursue other treatment since he is on the anticoagulant.   Patient to follow up in 70mfor anticipated need for repeat PSA in the setting of recently treated/ resected prostate cancer.  Ryen Rhames M. GLajuana Ripple DO

## 2020-10-20 LAB — CBC
Hematocrit: 44.6 % (ref 37.5–51.0)
Hemoglobin: 14.7 g/dL (ref 13.0–17.7)
MCH: 29.3 pg (ref 26.6–33.0)
MCHC: 33 g/dL (ref 31.5–35.7)
MCV: 89 fL (ref 79–97)
Platelets: 217 10*3/uL (ref 150–450)
RBC: 5.02 x10E6/uL (ref 4.14–5.80)
RDW: 12.6 % (ref 11.6–15.4)
WBC: 6.9 10*3/uL (ref 3.4–10.8)

## 2020-10-20 LAB — CMP14+EGFR
ALT: 24 IU/L (ref 0–44)
AST: 29 IU/L (ref 0–40)
Albumin/Globulin Ratio: 1.4 (ref 1.2–2.2)
Albumin: 3.9 g/dL (ref 3.7–4.7)
Alkaline Phosphatase: 72 IU/L (ref 44–121)
BUN/Creatinine Ratio: 21 (ref 10–24)
BUN: 19 mg/dL (ref 8–27)
Bilirubin Total: 0.8 mg/dL (ref 0.0–1.2)
CO2: 26 mmol/L (ref 20–29)
Calcium: 8.7 mg/dL (ref 8.6–10.2)
Chloride: 103 mmol/L (ref 96–106)
Creatinine, Ser: 0.91 mg/dL (ref 0.76–1.27)
Globulin, Total: 2.8 g/dL (ref 1.5–4.5)
Glucose: 92 mg/dL (ref 65–99)
Potassium: 4.4 mmol/L (ref 3.5–5.2)
Sodium: 142 mmol/L (ref 134–144)
Total Protein: 6.7 g/dL (ref 6.0–8.5)
eGFR: 89 mL/min/{1.73_m2} (ref >59)

## 2020-10-20 LAB — LIPID PANEL
Chol/HDL Ratio: 3.1 ratio (ref 0.0–5.0)
Cholesterol, Total: 116 mg/dL (ref 100–199)
HDL: 37 mg/dL — ABNORMAL LOW (ref >39)
LDL Chol Calc (NIH): 62 mg/dL (ref 0–99)
Triglycerides: 86 mg/dL (ref 0–149)
VLDL Cholesterol Cal: 17 mg/dL (ref 5–40)

## 2020-10-20 LAB — TSH: TSH: 2.9 u[IU]/mL (ref 0.450–4.500)

## 2020-10-20 LAB — VITAMIN D 25 HYDROXY (VIT D DEFICIENCY, FRACTURES): Vit D, 25-Hydroxy: 64 ng/mL (ref 30.0–100.0)

## 2020-10-20 LAB — PSA: Prostate Specific Ag, Serum: 0.8 ng/mL (ref 0.0–4.0)

## 2020-11-01 ENCOUNTER — Ambulatory Visit: Payer: Medicare Other | Admitting: Neurology

## 2020-11-01 ENCOUNTER — Encounter: Payer: Self-pay | Admitting: Neurology

## 2020-11-01 ENCOUNTER — Other Ambulatory Visit: Payer: Self-pay

## 2020-11-01 VITALS — BP 104/68 | HR 58 | Ht 74.0 in | Wt 217.0 lb

## 2020-11-01 DIAGNOSIS — G4733 Obstructive sleep apnea (adult) (pediatric): Secondary | ICD-10-CM | POA: Diagnosis not present

## 2020-11-01 DIAGNOSIS — I480 Paroxysmal atrial fibrillation: Secondary | ICD-10-CM

## 2020-11-01 DIAGNOSIS — Z9989 Dependence on other enabling machines and devices: Secondary | ICD-10-CM | POA: Diagnosis not present

## 2020-11-01 DIAGNOSIS — Z7901 Long term (current) use of anticoagulants: Secondary | ICD-10-CM

## 2020-11-01 NOTE — Patient Instructions (Signed)

## 2020-11-01 NOTE — Progress Notes (Signed)
SLEEP MEDICINE CLINIC    Provider:  Larey Seat, MD  Primary Care Physician:  [name], DO [place] [address]     Referring Provider: Halford Chessman [place],  [place] [address]          Chief Complaint according to patient   Patient presents with:    . New Patient (Initial Visit)     alone, rm 10. presents today for OSA eval. states never had a SS. years ago his cardiologist reccomended he have eval done when dg with Afib but he never pursued it. he had covid last year and suffered with brain fog and chronic fatigue since then. wakes up feeling exhausted.       HISTORY OF PRESENT ILLNESS:  Jordan [name] is a 75 y.o. year old Caucasian male patient seen here upon recommendation of cardiology, Underwent sleep study in September 2021, very mild apnea diagnosed-  Had chronic viral fatigue-  Outcome quoted here :Summary & Diagnosis:   There is very mild sleep apnea noted here- the AHI is only 5.5/h,  with NREM AHI of only 3.7/h and REM AHI of 11.7/h. there is no  clinically relevant hypoxia associated and the heart rate trend  is towards bradycardia.    Recommendations: Given the very high daytime sleepiness score of Epworth 18, I  need to first treat sleep apnea, however mild, before pursuing a  narcolepsy evaluation. The patient can use a dental device or  CPAP for at least 60 days before we re-evaluate his sleepiness  degree and initiate further work up.  Post-viral fatigue and EDS are known conditions that have  affected Covid patients.  I will initiate auto CPAP at 5-12 cm water, no EPR and mask of  patient's choice in the meantime.    Interpreting Physician: Larey Seat, MD 03-08-2021  He finally got his CPAP delivered and now has used it 3 month through [facility]. He is still wearing a night guard- an oclusion- split with CPAP - he had for one night not used it and only used CPAP and had more AHI. Marland Kitchen  The  patient has been using CPAP and he has felt less fatigue and less sleepiness.  In spite of the mild degree of apnea there is a therapeutic benefit noted.  He has been 100% compliant with an average of 7 hours and 11 minutes he has very few central apneas but his obstructive AHI is 5.0.  He has been set for an expiratory pressure relief of 3 cmH2O and he uses a full facemask.  There is virtually no air leak noted the pressure is every night the same but his AHI has varied so for example from the third to 6 May he had a high AHI but the week before his AHI was 0.7/h. He is unsure to why this can be. No nasal congestion, uses nasal strips. still has tinnitus, non -pulsatile.   HE CAN TELL A DIFFERENCE: especially in contrast to the post covid fatigue - he feels refreshed, motivated and his wife confirms this.     Original referral  from PCP. Chief concern according to patient :  ' I am a Covid 55 survivor 05-2019- contracted at work from a coworker". I had to retire because I couldn't recover from the fatigue. I  Worked in a Development worker, international aid ". I have the pleasure of seeing Jordan [name] on 01-21-2020, a right-handed Caucasian male  and grandfather of 11 with a possible sleep disorder. 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 He considers himself fatigued since Covid,   Sleep relevant medical history:  Post COVID status and vaccinated- snoring, fatigue, atrial fibrillation, breathing treatments. Never dropped oxygen saturation at night-" I had many,many nightmares, hallucinations while sick with Covid".   Also tinnitus but no olfactory loss.   Social history:  Patient is recently retired from his Development worker, international aid and lives in a household with spouse. Tobacco use: never. ETOH use ; never, Caffeine intake in form of Coffee( decaffeinated- rare ) Soda( /) Tea ( /) or energy drinks.Regular exercise in form of walking .   Hobbies : gardening, reading ,fishing by kayak.   Sleep habits are as follows: The patient's dinner time is between 6 PM. The patient goes to bed at 11 PM and he continues to struggle with tinnitus - once asleep he will sleep for 7 hours, wakes for 3 bathroom breaks, the first time at 2-3 AM.   The preferred sleep position is laterally, with the support of 1-2 pillows.  Dreams are reportedly frequent/vivid ever since COVID.  6.30 AM is the usual rise time. The patient wakes up spontaneously/ at 6 AM. He reports not feeling refreshed or restored in AM, with symptoms such as dry mouth , morning headaches , and residual fatigue.  Naps are taken frequently now since retirement and since COVID- , lasting from 60 minutes and are refreshing.   Review of Systems: Out of a complete 14 system review, the patient complains of only the following symptoms, and all other reviewed systems are negative.:   varicosis- veins, discoloured lower extremities.   Fatigue, excessive daytime sleepiness , snoring, fragmented sleep,  Vivid dreams, tinnitus   How likely are you to doze in the following situations: 0 = not likely, 1 = slight chance, 2 = moderate chance, 3 = high chance   Sitting and Reading? Watching Television? Sitting inactive in a public place (theater or meeting)? As a passenger in a car for an hour without a break? Lying down in the afternoon when circumstances permit? Sitting and talking to someone? Sitting quietly after lunch without alcohol? In a car, while stopped for a few minutes in traffic?   Total = 18/ 24 points , now on CPAP to 12 points   FSS endorsed at 51/ 63 points.  NOW at 36/ 63 - no longer having sleepiness while driving, can go to the beach house without pause.   Social History   Socioeconomic History  . Marital status: Married    Spouse name: [name]   . Number of children: 3  . Years of education: bachelors  . Highest education level: Not on file  Occupational History  . Occupation: Dental Laboratory    Comment: Makes dental appliances  Tobacco Use  . Smoking status: Never Smoker  . Smokeless tobacco: Never Used  Vaping Use  . Vaping Use: Never used  Substance and Sexual Activity  . Alcohol use: No  . Drug use: No  . Sexual activity: Yes  Other Topics Concern  . Not on file  Social History Narrative   Married, he has children and 7 grandchildren.  [place], raises chickens,  He works as a Neurosurgeon.    11/23/2015   Social Determinants of Health   Financial Resource Strain: Not on file  Food Insecurity: Not on file  Transportation Needs: Not on file  Physical Activity: Not on file  Stress: Not on file  Social Connections: Not on file    Family History  Problem Relation Age of Onset  . Heart attack Mother   . Diabetes Mother   . Heart attack Brother   . Heart disease Brother   . Stroke Maternal Grandfather   . Stroke Paternal Grandfather   . Macular degeneration Sister   . Cancer Daughter        colon   . Obesity Sister   . Paranoid behavior Sister   . Cancer Sister        lung - uterus   . COPD Sister   . Colon cancer Neg Hx     Past Medical History:  Diagnosis Date  . Anxiety    Mild  . Atrial fibrillation (HCC)    Paroxysmal  . BPH (benign prostatic hyperplasia)   . CAD (coronary artery disease)    Stent LAD, 2002  /  nuclear 2009, no ischemia  /    [facility] October, 2011 nuclear, no ischemia, possible slight apical scar, ejection fraction 70%  . Carotid bruit    Doppler November, 2011, normal  . Chest pain 02/23/2013  . Chronic anticoagulation    Low CHADS , score, but patient wants  to be aggressive  . Colon polyps   . Drug intolerance    Mild beta blocker intolerance  . Dyslipidemia   . Ejection fraction    EF 65-70%, echo, 2007 /  EF 70%, nuclear, 2011  . Hyperlipidemia   . IBS (irritable bowel syndrome)   . Incomplete RBBB   . Knee pain    left  . Personal history of colonic adenomas 11/29/2006   Qualifier: Diagnosis of  By: [place], [place]    . Syncope    November, 2010    Past Surgical History:  Procedure Laterality Date  . BACK SURGERY  05/1990   [id number]  . CARDIAC CATHETERIZATION    . COLONOSCOPY  multiple  . PROSTATE SURGERY       Current Outpatient Medications on File Prior to Visit  Medication Sig Dispense Refill  . apixaban (ELIQUIS) 5 MG TABS tablet TAKE  (1)  TABLET TWICE A DAY. 180 tablet 1  . atorvastatin (LIPITOR) 80 MG tablet TAKE 1 TABLET DAILY AT 6PM 90 tablet 3  . Calcium Carb-Cholecalciferol (CALCIUM 600+D3 PO) Take 1 capsule by mouth daily.    . carboxymethylcellulose (REFRESH PLUS) 0.5 % SOLN Place 1 drop into both eyes 3 (three) times daily as needed. For dry eyes    . Cholecalciferol (VITAMIN D3) 1000 UNITS CAPS Take 1,000 Units by mouth daily.    Marland Kitchen diltiazem (CARDIZEM CD) 180 MG 24 hr capsule Take 1 capsule (180 mg total) by mouth daily. 90 capsule 3  . diltiazem (CARDIZEM) 60 MG tablet Take 1 tablet (60 mg total) by mouth as needed. 90 tablet 1  . doxycycline (VIBRA-TABS) 100 MG tablet Take 1 tablet (100 mg total) by mouth 2 (two) times daily. 1 po bid 28 tablet 0  . fish oil-omega-3 fatty acids 1000 MG capsule Take 1 g by mouth daily.    . Glucosamine-Chondroitin 1500-1200 MG/30ML LIQD Take 1,500 mg by mouth daily.    . Multiple  Vitamins-Minerals (MULTIVITAMIN WITH MINERALS) tablet Take 1 tablet by mouth daily.    . nitroGLYCERIN (NITROSTAT) 0.4 MG SL tablet Place 1 tablet (0.4 mg total) under the tongue every 5 (five) minutes as needed for chest pain. 25 tablet 3  . Probiotic Product (ALIGN PO) Take 1 tablet by mouth daily.  As needed     No current facility-administered medications on file prior to visit.    Allergies  Allergen Reactions  . Levofloxacin Other (See Comments)    Couldn't breathe, passed out  . Lopid [Gemfibrozil] Other (See Comments)    Loose bowels  . Sulfonamide Derivatives Hives    Physical exam:  Today's Vitals   11/01/20 1031  BP: 104/68  Pulse: (!) 58  Weight: 217 lb (98.4 kg)  Height: 6\' 2"  (1.88 m)   Body mass index is 27.86 kg/m.   Wt Readings from Last 3 Encounters:  11/01/20 217 lb (98.4 kg)  10/19/20 215 lb (97.5 kg)  10/11/20 219 lb (99.3 kg)     Ht Readings from Last 3 Encounters:  11/01/20 6\' 2"  (1.88 m)  10/19/20 6\' 2"  (1.88 m)  10/11/20 6\' 2"  (1.88 m)      General: The patient is awake, alert and appears not in acute distress. The patient is well groomed. Head: Normocephalic, atraumatic. Neck is supple. Mallampati: 3 plus , crowded lower jaw, small mouth-  neck circumference: 16.5 inches . Nasal airflow  patent. ( he uses dental bruxism device and nasal strips.)  Dental status: intact.  Cardiovascular:  Regular rate and cardiac rhythm by pulse, without distended neck veins. Respiratory: Lungs are clear to auscultation.  Skin:  Without evidence of ankle edema, or rash. Trunk: The patient's posture is erect.   Neurologic exam : The patient is awake and alert, oriented to place and time.   Memory subjective described as intact.  Attention span & concentration ability appears normal.  Speech is fluent,  without  dysarthria, dysphonia or aphasia.  Mood and affect are appropriate.   Cranial nerves: no loss of smell or taste reported  Pupils are equal and briskly reactive to light. Funduscopic exam  - beginning cataract right lens only.  Extraocular movements in vertical and horizontal planes were intact and without nystagmus. No Diplopia. Visual fields by finger perimetry are intact. Hearing was intact to soft voice and finger rubbing.    Facial  sensation intact to fine touch.  Facial motor strength is symmetric and tongue and uvula move midline.  Neck ROM : rotation, tilt and flexion extension were normal for age and shoulder shrug was symmetrical.    Motor exam:  Symmetric bulk, tone and ROM.   Normal tone without cog wheeling, symmetric grip strength .   Sensory:  Fine touch,and vibration were normal.  Proprioception tested in the upper extremities was normal.   Coordination: Rapid alternating movements in the fingers/hands were of normal speed.  The Finger-to-nose maneuver was intact without evidence of ataxia, dysmetria or tremor.   Gait and station: Patient could rise unassisted from a seated position, walked without assistive device.  Stance is of normal width/ base and the patient turned with 3 steps.  Toe and heel walk were deferred.  Deep tendon reflexes: in the  upper and lower extremities are symmetric and intact.  Babinski response was deferred.      After spending a total time of 21 minutes face to face and additional time for physical and neurologic examination, review of laboratory studies,  personal review of  imaging studies, reports and results of other testing and review of referral information / records as far as provided in visit, I have established the following assessments:   Patient with mild OSA - seen here because of comorbididties- atrial fibrillation, hypercholesterolemia, CAD, and small , crowded jaw-  The patient is predisposed to OSA, he snored.   Post Covid, he experienced a change in sleep duration, in daytime sleepiness, and in dream pressure. He remained with excessive daytime sleepiness. He remained fatigued and with tinnitus.    Now on CPAP, had no further atrial fibrillation since using CPAP - he is using FFM and 100 % compliance is less sleepy and less fatigued.  He is getting more dream sleep- REM sleep and feels rested, he is more forgetful than before COVID but much better than after he  recovered form the acute viral illness.    I would like to thank [name], DO  for allowing me to meet with and to take care of this pleasant patient.   In short, BERLEY TIBBETTS is presenting with resolution of excessive sleepiness, fatigue, post Covid symptoms and atrial fibrillatin.  I plan to follow up either personally or through our NP within 12  month.    Electronically signed by: Larey Seat, MD 11/01/2020 10:36 AM  [facility] Board certified by The [employer] of Sleep Medicine and Diplomate of the [employer] of Sleep Medicine. Board certified In Neurology through the [place], Fellow of the [employer] of Neurology. Medical Director of [facility].

## 2020-11-09 DIAGNOSIS — G4733 Obstructive sleep apnea (adult) (pediatric): Secondary | ICD-10-CM | POA: Diagnosis not present

## 2020-11-18 ENCOUNTER — Ambulatory Visit (INDEPENDENT_AMBULATORY_CARE_PROVIDER_SITE_OTHER): Payer: Medicare Other

## 2020-11-18 VITALS — Wt 215.0 lb

## 2020-11-18 DIAGNOSIS — Z86010 Personal history of colonic polyps: Secondary | ICD-10-CM

## 2020-11-18 DIAGNOSIS — Z0000 Encounter for general adult medical examination without abnormal findings: Secondary | ICD-10-CM | POA: Diagnosis not present

## 2020-11-18 DIAGNOSIS — Z1211 Encounter for screening for malignant neoplasm of colon: Secondary | ICD-10-CM

## 2020-11-18 NOTE — Progress Notes (Signed)
Subjective:   Jordan [name] is a 75 y.o. male who presents for Medicare Annual/Subsequent preventive examination.  Virtual Visit via Telephone Note  I connected with  Jordan [name] on 11/18/20 at  2:45 PM EDT by telephone and verified that I am speaking with the correct person using two identifiers.  Location: Patient: Home Provider: [facility] Persons participating in the virtual visit: patient/Nurse Health Advisor   I discussed the limitations, risks, security and privacy concerns of performing an evaluation and management service by telephone and the availability of in person appointments. The patient expressed understanding and agreed to proceed.  Interactive audio and video telecommunications were attempted between this nurse and patient, however failed, due to patient having technical difficulties OR patient did not have access to video capability.  We continued and completed visit with audio only.  Some vital signs may be absent or patient reported.   Hayleigh Bawa E Lawerance Matsuo, LPN   Review of Systems     Cardiac Risk Factors include: advanced age (>79men, >50 women);dyslipidemia;Other (see comment);male gender, Risk factor comments: Atrial Fibrillation     Objective:    Today's Vitals   11/18/20 1453  Weight: 215 lb (97.5 kg)   Body mass index is 27.6 kg/m.  Advanced Directives 11/18/2020 11/18/2019 10/24/2018 03/05/2017 09/22/2015 03/22/2015 02/23/2013  Does Patient Have a Medical Advance Directive? Yes Yes Yes Yes Yes;No Yes Patient has advance directive, copy not in chart  Type of Advance Directive [place];Living will [place];Living will [place];Living will [place];Living will - [place];Living will [place];Living will  Does patient want to make changes to medical advance directive? - No - Patient declined No - Patient declined No - Patient declined No - Patient  declined - -  Copy of [place] in Chart? No - copy requested No - copy requested No - copy requested No - copy requested - - Copy requested from family  Pre-existing out of facility DNR order (yellow form or pink MOST form) - - - - - - No    Current Medications (verified) Outpatient Encounter Medications as of 11/18/2020  Medication Sig   apixaban (ELIQUIS) 5 MG TABS tablet TAKE  (1)  TABLET TWICE A DAY.   atorvastatin (LIPITOR) 80 MG tablet TAKE 1 TABLET DAILY AT 6PM   Calcium Carb-Cholecalciferol (CALCIUM 600+D3 PO) Take 1 capsule by mouth daily.   carboxymethylcellulose (REFRESH PLUS) 0.5 % SOLN Place 1 drop into both eyes 3 (three) times daily as needed. For dry eyes   Cholecalciferol (VITAMIN D3) 1000 UNITS CAPS Take 1,000 Units by mouth daily.   diltiazem (CARDIZEM CD) 180 MG 24 hr capsule Take 1 capsule (180 mg total) by mouth daily.   diltiazem (CARDIZEM) 60 MG tablet Take 1 tablet (60 mg total) by mouth as needed.   fish oil-omega-3 fatty acids 1000 MG capsule Take 1 g by mouth daily.   Glucosamine-Chondroitin 1500-1200 MG/30ML LIQD Take 1,500 mg by mouth daily.   Multiple Vitamins-Minerals (MULTIVITAMIN WITH MINERALS) tablet Take 1 tablet by mouth daily.   nitroGLYCERIN (NITROSTAT) 0.4 MG SL tablet Place 1 tablet (0.4 mg total) under the tongue every 5 (five) minutes as needed for chest pain.   Probiotic Product (ALIGN PO) Take 1 tablet by mouth daily. As needed   doxycycline (VIBRA-TABS) 100 MG tablet Take 1 tablet (100 mg total) by mouth 2 (two) times daily. 1 po bid (Patient not taking:  Reported on 11/18/2020)   No facility-administered encounter medications on file as of 11/18/2020.    Allergies (verified) Levofloxacin, Lopid [gemfibrozil], and Sulfonamide derivatives   History: Past Medical History:  Diagnosis Date   Anxiety    Mild   Atrial fibrillation (HCC)    Paroxysmal   BPH (benign prostatic hyperplasia)    CAD (coronary artery disease)    Stent  LAD, 2002  /  nuclear 2009, no ischemia  /    Hospital October, 2011 nuclear, no ischemia, possible slight apical scar, ejection fraction 70%   Carotid bruit    Doppler November, 2011, normal   Chest pain 02/23/2013   Chronic anticoagulation    Low CHADS , score, but patient wants to be aggressive   Colon polyps    Drug intolerance    Mild beta blocker intolerance   Dyslipidemia    Ejection fraction    EF 65-70%, echo, 2007 /  EF 70%, nuclear, 2011   Hyperlipidemia    IBS (irritable bowel syndrome)    Incomplete RBBB    Knee pain    left   Personal history of colonic adenomas 11/29/2006   Qualifier: Diagnosis of  By: Danny Lawless CMA, [place]     Syncope    November, 2010   Past Surgical History:  Procedure Laterality Date   BACK SURGERY  05/1990   [id number]   CARDIAC CATHETERIZATION     COLONOSCOPY  multiple   PROSTATE SURGERY     Family History  Problem Relation Age of Onset   Heart attack Mother    Diabetes Mother    Heart attack Brother    Heart disease Brother    Stroke Maternal Grandfather    Stroke Paternal Grandfather    Macular degeneration Sister    Cancer Daughter        colon    Obesity Sister    Paranoid behavior Sister    Cancer Sister        lung - uterus    COPD Sister    Colon cancer Neg Hx    Social History   Socioeconomic History   Marital status: Married    Spouse name: [name]    Number of children: 3   Years of education: bachelors   Highest education level: Not on file  Occupational History   Occupation: Development worker, international aid    Comment: Makes dental appliances  Tobacco Use   Smoking status: Never   Smokeless tobacco: Never  Vaping Use   Vaping Use: Never used  Substance and Sexual Activity   Alcohol use: No   Drug use: No   Sexual activity: Yes  Other Topics Concern   Not on file  Social History Narrative   Married, he has children and grandchildren.   [place], raises chickens, works as a Neurosurgeon    11/23/2015    Social Determinants of Health   Financial Resource Strain: Low Risk    Difficulty of Paying Living Expenses: Not hard at all  Food Insecurity: No Food Insecurity   Worried About Charity fundraiser in the Last Year: Never true   Arboriculturist in the Last Year: Never true  Transportation Needs: No Transportation Needs   Lack of Transportation (Medical): No   Lack of Transportation (Non-Medical): No  Physical Activity: Sufficiently Active   Days of Exercise per Week: 7 days   Minutes of Exercise per Session: 60 min  Stress: No Stress Concern Present   Feeling of  Stress : Not at all  Social Connections: Socially Integrated   Frequency of Communication with Friends and Family: More than three times a week   Frequency of Social Gatherings with Friends and Family: More than three times a week   Attends Religious Services: More than 4 times per year   Active Member of [employer] or Organizations: Yes   Attends Music therapist: More than 4 times per year   Marital Status: Married    Tobacco Counseling Counseling given: Not Answered   Clinical Intake:  Pre-visit preparation completed: Yes  Pain : No/denies pain     BMI - recorded: 27.6 Nutritional Status: BMI 25 -29 Overweight Nutritional Risks: None Diabetes: No  How often do you need to have someone help you when you read instructions, pamphlets, or other written materials from your doctor or pharmacy?: 1 - Never  Diabetic?No  Interpreter Needed?: No  Information entered by :: Bladyn Tipps, LPN   Activities of Daily Living In your present state of health, do you have any difficulty performing the following activities: 11/18/2020  Hearing? 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 N  Difficulty concentrating or making decisions? N  Walking or climbing stairs? N  Dressing or bathing? N  Doing errands, shopping? N  Preparing Food and eating ? N  Using the Toilet? N  In the past  six months, have you accidently leaked urine? Y  Comment prostatectomy 07/2020  Do you have problems with loss of bowel control? N  Managing your Medications? N  Managing your Finances? N  Housekeeping or managing your Housekeeping? N  Some recent data might be hidden    Patient Care Team: [name], DO as PCP - General (Family Medicine) Jerline Pain, MD as PCP - Cardiology (Cardiology) Gatha Mayer, MD (Gastroenterology) Jarome Matin, MD as Consulting Physician (Dermatology) Merry Proud (Urology) Dohmeier, Asencion Partridge, MD as Consulting Physician (Neurology) Harlen Labs, MD as Referring Physician (Optometry)  Indicate any recent Medical Services you may have received from other than [facility] providers in the past year (date may be approximate).     Assessment:   This is a routine wellness examination for Jordan [name].  Hearing/Vision screen Hearing Screening - Comments:: Denies hearing difficulties - he does have chronic tinnitus since having covid Vision Screening - Comments:: Wears eyeglasses - up to date with annual eye exams with Dr Marin Comment  Dietary issues and exercise activities discussed: Current Exercise Habits: Home exercise routine, Type of exercise: walking;strength training/weights, Time (Minutes): 60, Frequency (Times/Week): 7, Weekly Exercise (Minutes/Week): 420, Intensity: Moderate, Exercise limited by: cardiac condition(s)   Goals Addressed             This Visit's Progress    Prevent falls   On track    Stay active  Retirement          Depression Screen [facility] 2/9 Scores 11/18/2020 10/19/2020 04/23/2020 11/18/2019 12/04/2018 10/24/2018 06/19/2018  PHQ - 2 Score 0 0 0 0 0 0 0  PHQ- 9 Score - - - - 0 - -    Fall Risk Fall Risk  11/18/2020 10/19/2020 04/23/2020 01/21/2020 12/22/2019  Falls in the past year? 0 0 0 0 0  Number falls in past yr: 0 - - - -  Injury with Fall? 0 - - - -  Risk for fall due to : Impaired vision - - - History of fall(s)   Follow up Falls prevention discussed - - -  Falls prevention discussed    FALL RISK PREVENTION PERTAINING TO THE HOME:  Any stairs in or around the home? No  If so, are there any without handrails? No  Home free of loose throw rugs in walkways, pet beds, electrical cords, etc? Yes  Adequate lighting in your home to reduce risk of falls? Yes   ASSISTIVE DEVICES UTILIZED TO PREVENT FALLS:  Life alert? No  Use of a cane, walker or w/c? No  Grab bars in the bathroom? Yes  Shower chair or bench in shower? No  Elevated toilet seat or a handicapped toilet? No   TIMED UP AND GO:  Was the test performed? No . Telephonic visit.  Cognitive Function: Normal cognitive status assessed by direct observation by this Nurse Health Advisor. No abnormalities found.      6CIT Screen 11/18/2019 11/18/2019 10/24/2018  What Year? 0 points 0 points 0 points  What month? 0 points 0 points 0 points  What time? 0 points - 0 points  Count back from 20 0 points - 0 points  Months in reverse 0 points - 0 points  Repeat phrase 0 points - 0 points  Total Score 0 - 0    Immunizations Immunization History  Administered Date(s) Administered   Influenza,inj,Quad PF,6+ Mos 02/24/2013, 03/28/2014, 03/26/2015, 03/10/2017, 03/26/2018   Influenza-Unspecified 03/25/2016, 03/26/2018, 03/13/2019   PFIZER(Purple Top)SARS-COV-2 Vaccination 07/08/2019, 07/29/2019, 03/11/2020, 09/17/2020   Pneumococcal Conjugate-13 11/07/2013   Pneumococcal Polysaccharide-23 10/16/2012   Tdap 11/28/2016   Zoster Recombinat (Shingrix) 10/11/2017, 12/10/2017    TDAP status: Up to date  Flu Vaccine status: Up to date  Pneumococcal vaccine status: Up to date  Covid-19 vaccine status: Completed vaccines  Qualifies for Shingles Vaccine? Yes   Zostavax completed Yes   Shingrix Completed?: Yes  Screening Tests Health Maintenance  Topic Date Due   Pneumococcal Vaccine 82-40 Years old (1 - PCV) Never done   COLONOSCOPY (Pts 45-70yrs  Insurance coverage will need to be confirmed)  12/29/2020   INFLUENZA VACCINE  01/10/2021   COVID-19 Vaccine (5 - Booster for Pfizer series) 01/17/2021   TETANUS/TDAP  11/29/2026   Hepatitis C Screening  Completed   PNA vac Low Risk Adult  Completed   Zoster Vaccines- Shingrix  Completed   HPV VACCINES  Aged Out    Health Maintenance  Health Maintenance Due  Topic Date Due   Pneumococcal Vaccine 84-3 Years old (1 - PCV) Never done    Colorectal cancer screening: Referral to GI placed 11/18/20. Pt aware the office will call re: appt.  Lung Cancer Screening: (Low Dose CT Chest recommended if Age 31-80 years, 30 pack-year currently smoking OR have quit w/in 15years.) does not qualify.   Additional Screening:  Hepatitis C Screening: does qualify; Completed 11/28/2016  Vision Screening: Recommended annual ophthalmology exams for early detection of glaucoma and other disorders of the eye. Is the patient up to date with their annual eye exam?  Yes  Who is the provider or what is the name of the office in which the patient attends annual eye exams? Dr Marin Comment in [place] If pt is not established with a provider, would they like to be referred to a provider to establish care? No .   Dental Screening: Recommended annual dental exams for proper oral hygiene  Community Resource Referral / Chronic Care Management: CRR required this visit?  No   CCM required this visit?  No      Plan:     I have personally reviewed  and noted the following in the patient's chart:   Medical and social history Use of alcohol, tobacco or illicit drugs  Current medications and supplements including opioid prescriptions. Patient is not currently taking opioid prescriptions. Functional ability and status Nutritional status Physical activity Advanced directives List of other physicians Hospitalizations, surgeries, and ER visits in previous 12 months Vitals Screenings to include cognitive, depression, and  falls Referrals and appointments  In addition, I have reviewed and discussed with patient certain preventive protocols, quality metrics, and best practice recommendations. A written personalized care plan for preventive services as well as general preventive health recommendations were provided to patient.     Sandrea Hammond, LPN   [id number]   Nurse Notes: None

## 2020-11-18 NOTE — Patient Instructions (Signed)
Jordan [name] , Thank you for taking time to come for your Medicare Wellness Visit. I appreciate your ongoing commitment to your health goals. Please review the following plan we discussed and let me know if I can assist you in the future.   Screening recommendations/referrals: Colonoscopy: Done 12/30/2015 - Repeat 5 years - ordered today Recommended yearly ophthalmology/optometry visit for glaucoma screening and checkup Recommended yearly dental visit for hygiene and checkup  Vaccinations: Influenza vaccine: Done 2021 - Repeat annually Pneumococcal vaccine: Done 10/16/2012 & 11/07/2013 Tdap vaccine: Done 11/28/2016 - Repeat in 10 years Shingles vaccine: Done 10/11/2017 & 12/10/2017   Covid-19: Done 07/08/2019, 07/29/2019, 03/11/2020, & 09/17/2020  Advanced directives: Please bring a copy of your health care power of attorney and living will to the office to be added to your chart at your convenience.  Conditions/risks identified: Keep up the great work! Continue at least 30 minutes of exercise or brisk walking each day, drink 6-8 glasses of water and eat lots of fruits and vegetables.  Next appointment: Follow up in one year for your annual wellness visit.   Preventive Care 37 Years and Older, Male  Preventive care refers to lifestyle choices and visits with your health care provider that can promote health and wellness. What does preventive care include? A yearly physical exam. This is also called an annual well check. Dental exams once or twice a year. Routine eye exams. Ask your health care provider how often you should have your eyes checked. Personal lifestyle choices, including: Daily care of your teeth and gums. Regular physical activity. Eating a healthy diet. Avoiding tobacco and drug use. Limiting alcohol use. Practicing safe sex. Taking low doses of aspirin every day. Taking vitamin and mineral supplements as recommended by your health care provider. What happens during an annual well  check? The services and screenings done by your health care provider during your annual well check will depend on your age, overall health, lifestyle risk factors, and family history of disease. Counseling  Your health care provider may ask you questions about your: Alcohol use. Tobacco use. Drug use. Emotional well-being. Home and relationship well-being. Sexual activity. Eating habits. History of falls. Memory and ability to understand (cognition). Work and work Statistician. Screening  You may have the following tests or measurements: Height, weight, and BMI. Blood pressure. Lipid and cholesterol levels. These may be checked every 5 years, or more frequently if you are over 52 years old. Skin check. Lung cancer screening. You may have this screening every year starting at age 36 if you have a 30-pack-year history of smoking and currently smoke or have quit within the past 15 years. Fecal occult blood test (FOBT) of the stool. You may have this test every year starting at age 81. Flexible sigmoidoscopy or colonoscopy. You may have a sigmoidoscopy every 5 years or a colonoscopy every 10 years starting at age 49. Prostate cancer screening. Recommendations will vary depending on your family history and other risks. Hepatitis C blood test. Hepatitis B blood test. Sexually transmitted disease (STD) testing. Diabetes screening. This is done by checking your blood sugar (glucose) after you have not eaten for a while (fasting). You may have this done every 1-3 years. Abdominal aortic aneurysm (AAA) screening. You may need this if you are a current or former smoker. Osteoporosis. You may be screened starting at age 29 if you are at high risk. Talk with your health care provider about your test results, treatment options, and if necessary, the need  for more tests. Vaccines  Your health care provider may recommend certain vaccines, such as: Influenza vaccine. This is recommended every  year. Tetanus, diphtheria, and acellular pertussis (Tdap, Td) vaccine. You may need a Td booster every 10 years. Zoster vaccine. You may need this after age 79. Pneumococcal 13-valent conjugate (PCV13) vaccine. One dose is recommended after age 81. Pneumococcal polysaccharide (PPSV23) vaccine. One dose is recommended after age 70. Talk to your health care provider about which screenings and vaccines you need and how often you need them. This information is not intended to replace advice given to you by your health care provider. Make sure you discuss any questions you have with your health care provider. Document Released: 06/25/2015 Document Revised: 02/16/2016 Document Reviewed: 03/30/2015 Elsevier Interactive Patient Education  2017 [place] Prevention in the Home Falls can cause injuries. They can happen to people of all ages. There are many things you can do to make your home safe and to help prevent falls. What can I do on the outside of my home? Regularly fix the edges of walkways and driveways and fix any cracks. Remove anything that might make you trip as you walk through a door, such as a raised step or threshold. Trim any bushes or trees on the path to your home. Use bright outdoor lighting. Clear any walking paths of anything that might make someone trip, such as rocks or tools. Regularly check to see if handrails are loose or broken. Make sure that both sides of any steps have handrails. Any raised decks and porches should have guardrails on the edges. Have any leaves, snow, or ice cleared regularly. Use sand or salt on walking paths during winter. Clean up any spills in your garage right away. This includes oil or grease spills. What can I do in the bathroom? Use night lights. Install grab bars by the toilet and in the tub and shower. Do not use towel bars as grab bars. Use non-skid mats or decals in the tub or shower. If you need to sit down in the shower, use a  plastic, non-slip stool. Keep the floor dry. Clean up any water that spills on the floor as soon as it happens. Remove soap buildup in the tub or shower regularly. Attach bath mats securely with double-sided non-slip rug tape. Do not have throw rugs and other things on the floor that can make you trip. What can I do in the bedroom? Use night lights. Make sure that you have a light by your bed that is easy to reach. Do not use any sheets or blankets that are too big for your bed. They should not hang down onto the floor. Have a firm chair that has side arms. You can use this for support while you get dressed. Do not have throw rugs and other things on the floor that can make you trip. What can I do in the kitchen? Clean up any spills right away. Avoid walking on wet floors. Keep items that you use a lot in easy-to-reach places. If you need to reach something above you, use a strong step stool that has a grab bar. Keep electrical cords out of the way. Do not use floor polish or wax that makes floors slippery. If you must use wax, use non-skid floor wax. Do not have throw rugs and other things on the floor that can make you trip. What can I do with my stairs? Do not leave any items on the stairs.  Make sure that there are handrails on both sides of the stairs and use them. Fix handrails that are broken or loose. Make sure that handrails are as long as the stairways. Check any carpeting to make sure that it is firmly attached to the stairs. Fix any carpet that is loose or worn. Avoid having throw rugs at the top or bottom of the stairs. If you do have throw rugs, attach them to the floor with carpet tape. Make sure that you have a light switch at the top of the stairs and the bottom of the stairs. If you do not have them, ask someone to add them for you. What else can I do to help prevent falls? Wear shoes that: Do not have high heels. Have rubber bottoms. Are comfortable and fit you  well. Are closed at the toe. Do not wear sandals. If you use a stepladder: Make sure that it is fully opened. Do not climb a closed stepladder. Make sure that both sides of the stepladder are locked into place. Ask someone to hold it for you, if possible. Clearly mark and make sure that you can see: Any grab bars or handrails. First and last steps. Where the edge of each step is. Use tools that help you move around (mobility aids) if they are needed. These include: Canes. Walkers. Scooters. Crutches. Turn on the lights when you go into a dark area. Replace any light bulbs as soon as they burn out. Set up your furniture so you have a clear path. Avoid moving your furniture around. If any of your floors are uneven, fix them. If there are any pets around you, be aware of where they are. Review your medicines with your doctor. Some medicines can make you feel dizzy. This can increase your chance of falling. Ask your doctor what other things that you can do to help prevent falls. This information is not intended to replace advice given to you by your health care provider. Make sure you discuss any questions you have with your health care provider. Document Released: 03/25/2009 Document Revised: 11/04/2015 Document Reviewed: 07/03/2014 Elsevier Interactive Patient Education  2017 [employer].

## 2020-11-22 DIAGNOSIS — R82998 Other abnormal findings in urine: Secondary | ICD-10-CM | POA: Diagnosis not present

## 2020-11-22 DIAGNOSIS — N138 Other obstructive and reflux uropathy: Secondary | ICD-10-CM | POA: Diagnosis not present

## 2020-12-09 DIAGNOSIS — G4733 Obstructive sleep apnea (adult) (pediatric): Secondary | ICD-10-CM | POA: Diagnosis not present

## 2021-01-07 DIAGNOSIS — G4733 Obstructive sleep apnea (adult) (pediatric): Secondary | ICD-10-CM | POA: Diagnosis not present

## 2021-01-09 DIAGNOSIS — G4733 Obstructive sleep apnea (adult) (pediatric): Secondary | ICD-10-CM | POA: Diagnosis not present

## 2021-01-18 ENCOUNTER — Other Ambulatory Visit: Payer: Self-pay

## 2021-01-18 ENCOUNTER — Encounter: Payer: Self-pay | Admitting: Family Medicine

## 2021-01-18 ENCOUNTER — Ambulatory Visit (INDEPENDENT_AMBULATORY_CARE_PROVIDER_SITE_OTHER): Payer: Medicare Other | Admitting: Family Medicine

## 2021-01-18 VITALS — BP 118/79 | HR 79 | Temp 97.7°F | Ht 74.0 in | Wt 217.0 lb

## 2021-01-18 DIAGNOSIS — R21 Rash and other nonspecific skin eruption: Secondary | ICD-10-CM | POA: Diagnosis not present

## 2021-01-18 DIAGNOSIS — S80862A Insect bite (nonvenomous), left lower leg, initial encounter: Secondary | ICD-10-CM

## 2021-01-18 DIAGNOSIS — S80861A Insect bite (nonvenomous), right lower leg, initial encounter: Secondary | ICD-10-CM | POA: Diagnosis not present

## 2021-01-18 DIAGNOSIS — W57XXXA Bitten or stung by nonvenomous insect and other nonvenomous arthropods, initial encounter: Secondary | ICD-10-CM | POA: Diagnosis not present

## 2021-01-18 MED ORDER — DOXYCYCLINE HYCLATE 100 MG PO TABS: 100.0000 mg | ORAL_TABLET | Freq: Two times a day (BID) | ORAL | 0 refills | Status: AC

## 2021-01-18 MED ORDER — TRIAMCINOLONE ACETONIDE 0.1 % EX CREA: 1.0000 "application " | TOPICAL_CREAM | Freq: Two times a day (BID) | CUTANEOUS | 0 refills | Status: DC

## 2021-01-18 NOTE — Progress Notes (Signed)
Subjective:  Patient ID: Jordan [name], male    DOB: 1945-11-14, 75 y.o.   MRN: [id number]  Patient Care Team: [name], DO as PCP - General (Family Medicine) Jerline Pain, MD as PCP - Cardiology (Cardiology) Gatha Mayer, MD (Gastroenterology) Jarome Matin, MD as Consulting Physician (Dermatology) Merry Proud (Urology) Dohmeier, Asencion Partridge, MD as Consulting Physician (Neurology) Harlen Labs, MD as Referring Physician (Optometry)   Chief Complaint:  tick bites (X4 days)   HPI: Jordan [name] is a 75 y.o. male presenting on 01/18/2021 for tick bites (X4 days)   Pt presents today for evaluation of several tick bites. States he was out in the yard doing work on Friday. States he noticed several ticks embedded in his lower legs and groin area on Sunday and removed them. States he now has surrounding circular erythematous rash at each bite location. He states area is pruritic. Denies other symptoms. He has used benadryl cream with minimal relief of symptoms. Has had previous tick bites but has not been tested for RMSF or Lyme disease.    Relevant past medical, surgical, family, and social history reviewed and updated as indicated.  Allergies and medications reviewed and updated. Date reviewed: Chart in Epic.   Past Medical History:  Diagnosis Date   Anxiety    Mild   Atrial fibrillation (HCC)    Paroxysmal   BPH (benign prostatic hyperplasia)    CAD (coronary artery disease)    Stent LAD, 2002  /  nuclear 2009, no ischemia  /    Hospital October, 2011 nuclear, no ischemia, possible slight apical scar, ejection fraction 70%   Carotid bruit    Doppler November, 2011, normal   Chest pain 02/23/2013   Chronic anticoagulation    Low CHADS , score, but patient wants to be aggressive   Colon polyps    Drug intolerance    Mild beta blocker intolerance   Dyslipidemia    Ejection fraction    EF 65-70%, echo, 2007 /  EF 70%, nuclear, 2011    Hyperlipidemia    IBS (irritable bowel syndrome)    Incomplete RBBB    Knee pain    left   Personal history of colonic adenomas 11/29/2006   Qualifier: Diagnosis of  By: Danny Lawless CMA, [place]     Syncope    November, 2010    Past Surgical History:  Procedure Laterality Date   BACK SURGERY  05/1990   [id number]   CARDIAC CATHETERIZATION     COLONOSCOPY  multiple   PROSTATE SURGERY      Social History   Socioeconomic History   Marital status: Married    Spouse name: [name]    Number of children: 3   Years of education: bachelors   Highest education level: Not on file  Occupational History   Occupation: Development worker, international aid    Comment: Makes dental appliances  Tobacco Use   Smoking status: Never   Smokeless tobacco: Never  Vaping Use   Vaping Use: Never used  Substance and Sexual Activity   Alcohol use: No   Drug use: No   Sexual activity: Yes  Other Topics Concern   Not on file  Social History Narrative   Married, he has children and grandchildren.   [place], raises chickens, works as a Neurosurgeon    11/23/2015   Social Determinants of Health   Financial Resource Strain: Low Risk    Difficulty of  Paying Living Expenses: Not hard at all  Food Insecurity: No Food Insecurity   Worried About [place] in the Last Year: Never true   Ran Out of Food in the Last Year: Never true  Transportation Needs: No Transportation Needs   Lack of Transportation (Medical): No   Lack of Transportation (Non-Medical): No  Physical Activity: Sufficiently Active   Days of Exercise per Week: 7 days   Minutes of Exercise per Session: 60 min  Stress: No Stress Concern Present   Feeling of Stress : Not at all  Social Connections: Socially Integrated   Frequency of Communication with Friends and Family: More than three times a week   Frequency of Social Gatherings with Friends and Family: More than three times a week   Attends Religious Services: More than 4 times  per year   Active Member of [employer] or Organizations: Yes   Attends Music therapist: More than 4 times per year   Marital Status: Married  Human resources officer Violence: Not At Risk   Fear of Current or Ex-Partner: No   Emotionally Abused: No   Physically Abused: No   Sexually Abused: No    Outpatient Encounter Medications as of 01/18/2021  Medication Sig   apixaban (ELIQUIS) 5 MG TABS tablet TAKE  (1)  TABLET TWICE A DAY.   atorvastatin (LIPITOR) 80 MG tablet TAKE 1 TABLET DAILY AT 6PM   Calcium Carb-Cholecalciferol (CALCIUM 600+D3 PO) Take 1 capsule by mouth daily.   carboxymethylcellulose (REFRESH PLUS) 0.5 % SOLN Place 1 drop into both eyes 3 (three) times daily as needed. For dry eyes   Cholecalciferol (VITAMIN D3) 1000 UNITS CAPS Take 1,000 Units by mouth daily.   diltiazem (CARDIZEM CD) 180 MG 24 hr capsule Take 1 capsule (180 mg total) by mouth daily.   diltiazem (CARDIZEM) 60 MG tablet Take 1 tablet (60 mg total) by mouth as needed.   doxycycline (VIBRA-TABS) 100 MG tablet Take 1 tablet (100 mg total) by mouth 2 (two) times daily for 21 days. 1 po bid   fish oil-omega-3 fatty acids 1000 MG capsule Take 1 g by mouth daily.   Glucosamine-Chondroitin 1500-1200 MG/30ML LIQD Take 1,500 mg by mouth daily.   Multiple Vitamins-Minerals (MULTIVITAMIN WITH MINERALS) tablet Take 1 tablet by mouth daily.   nitroGLYCERIN (NITROSTAT) 0.4 MG SL tablet Place 1 tablet (0.4 mg total) under the tongue every 5 (five) minutes as needed for chest pain.   Probiotic Product (ALIGN PO) Take 1 tablet by mouth daily. As needed   triamcinolone cream (KENALOG) 0.1 % Apply 1 application topically 2 (two) times daily.   [DISCONTINUED] doxycycline (VIBRA-TABS) 100 MG tablet Take 1 tablet (100 mg total) by mouth 2 (two) times daily. 1 po bid (Patient not taking: No sig reported)   No facility-administered encounter medications on file as of 01/18/2021.    Allergies  Allergen Reactions   Levofloxacin  Other (See Comments)    Couldn't breathe, passed out   Lopid [Gemfibrozil] Other (See Comments)    Loose bowels   Sulfonamide Derivatives Hives    Review of Systems  Constitutional:  Negative for activity change, appetite change, chills, diaphoresis, fatigue, fever and unexpected weight change.  HENT: Negative.    Eyes: Negative.   Respiratory:  Negative for cough, chest tightness and shortness of breath.   Cardiovascular:  Negative for chest pain, palpitations and leg swelling.  Gastrointestinal:  Negative for blood in stool, constipation, diarrhea, nausea and vomiting.  Endocrine: Negative.  Genitourinary:  Negative for dysuria, frequency and urgency.  Musculoskeletal:  Negative for arthralgias, back pain, gait problem, joint swelling, myalgias, neck pain and neck stiffness.  Skin:  Positive for color change and rash. Negative for pallor and wound.  Allergic/Immunologic: Negative.   Neurological:  Negative for dizziness, tremors, seizures, syncope, speech difficulty, weakness, light-headedness, numbness and headaches.  Hematological: Negative.   Psychiatric/Behavioral:  Negative for confusion, hallucinations, sleep disturbance and suicidal ideas.   All other systems reviewed and are negative.      Objective:  BP 118/79   Pulse 79   Temp 97.7 F (36.5 C)   Ht _0  (1.88 m)   Wt 217 lb (98.4 kg)   SpO2 96%   BMI 27.86 kg/m    Wt Readings from Last 3 Encounters:  01/18/21 217 lb (98.4 kg)  11/18/20 215 lb (97.5 kg)  11/01/20 217 lb (98.4 kg)    Physical Exam Vitals and nursing note reviewed.  Constitutional:      General: He is not in acute distress.    Appearance: Normal appearance. He is well-developed and well-groomed. He is not ill-appearing, toxic-appearing or diaphoretic.  HENT:     Head: Normocephalic and atraumatic.     Jaw: There is normal jaw occlusion.     Right Ear: Hearing normal.     Left Ear: Hearing normal.     Nose: Nose normal.      Mouth/Throat:     Lips: Pink.     Mouth: Mucous membranes are moist.     Pharynx: Oropharynx is clear. Uvula midline.  Eyes:     General: Lids are normal.     Extraocular Movements: Extraocular movements intact.     Conjunctiva/sclera: Conjunctivae normal.     Pupils: Pupils are equal, round, and reactive to light.  Neck:     Thyroid: No thyroid mass, thyromegaly or thyroid tenderness.     Vascular: No carotid bruit or JVD.     Trachea: Trachea and phonation normal.  Cardiovascular:     Rate and Rhythm: Normal rate and regular rhythm.     Chest Wall: PMI is not displaced.     Pulses: Normal pulses.     Heart sounds: Normal heart sounds. No murmur heard.   No friction rub. No gallop.  Pulmonary:     Effort: Pulmonary effort is normal. No respiratory distress.     Breath sounds: Normal breath sounds. No wheezing.  Abdominal:     General: Bowel sounds are normal. There is no distension or abdominal bruit.     Palpations: Abdomen is soft. There is no hepatomegaly or splenomegaly.     Tenderness: There is no abdominal tenderness. There is no right CVA tenderness or left CVA tenderness.     Hernia: No hernia is present.  Musculoskeletal:        General: Normal range of motion.     Cervical back: Normal range of motion and neck supple.     Right lower leg: No edema.     Left lower leg: No edema.  Lymphadenopathy:     Cervical: No cervical adenopathy.  Skin:    General: Skin is warm and dry.     Capillary Refill: Capillary refill takes less than 2 seconds.     Coloration: Skin is not cyanotic, jaundiced or pale.     Findings: Erythema, rash and wound present. No abrasion, abscess, acne, bruising, burn, ecchymosis, signs of injury, laceration, lesion or petechiae.  Neurological:     General: No focal deficit present.     Mental Status: He is alert and oriented to person, place, and time.     Cranial Nerves: Cranial nerves are intact.     Sensory: Sensation is intact.      Motor: Motor function is intact.     Coordination: Coordination is intact.     Gait: Gait is intact.     Deep Tendon Reflexes: Reflexes are normal and symmetric.  Psychiatric:        Attention and Perception: Attention and perception normal.        Mood and Affect: Mood and affect normal.        Speech: Speech normal.        Behavior: Behavior normal. Behavior is cooperative.        Thought Content: Thought content normal.        Cognition and Memory: Cognition and memory normal.        Judgment: Judgment normal.    Results for orders placed or performed in visit on 10/19/20  CMP14+EGFR  Result Value Ref Range   Glucose 92 65 - 99 mg/dL   BUN 19 8 - 27 mg/dL   Creatinine, Ser 0.91 0.76 - 1.27 mg/dL   eGFR 89 >59 mL/min/1.73   BUN/Creatinine Ratio 21 10 - 24   Sodium 142 134 - 144 mmol/L   Potassium 4.4 3.5 - 5.2 mmol/L   Chloride 103 96 - 106 mmol/L   CO2 26 20 - 29 mmol/L   Calcium 8.7 8.6 - 10.2 mg/dL   Total Protein 6.7 6.0 - 8.5 g/dL   Albumin 3.9 3.7 - 4.7 g/dL   Globulin, Total 2.8 1.5 - 4.5 g/dL   Albumin/Globulin Ratio 1.4 1.2 - 2.2   Bilirubin Total 0.8 0.0 - 1.2 mg/dL   Alkaline Phosphatase 72 44 - 121 IU/L   AST 29 0 - 40 IU/L   ALT 24 0 - 44 IU/L  Lipid Panel  Result Value Ref Range   Cholesterol, Total 116 100 - 199 mg/dL   Triglycerides 86 0 - 149 mg/dL   HDL 37 (L) >39 mg/dL   VLDL Cholesterol Cal 17 5 - 40 mg/dL   LDL Chol Calc (NIH) 62 0 - 99 mg/dL   Chol/HDL Ratio 3.1 0.0 - 5.0 ratio  TSH  Result Value Ref Range   TSH 2.900 0.450 - 4.500 uIU/mL  CBC  Result Value Ref Range   WBC 6.9 3.4 - 10.8 x10E3/uL   RBC 5.02 4.14 - 5.80 x10E6/uL   Hemoglobin 14.7 13.0 - 17.7 g/dL   Hematocrit 44.6 37.5 - 51.0 %   MCV 89 79 - 97 fL   MCH 29.3 26.6 - 33.0 pg   MCHC 33.0 31.5 - 35.7 g/dL   RDW 12.6 11.6 - 15.4 %   Platelets 217 150 - 450 x10E3/uL  VITAMIN D 25 Hydroxy (Vit-D Deficiency, Fractures)  Result Value Ref Range   Vit D, 25-Hydroxy 64.0 30.0 -  100.0 ng/mL  PSA  Result Value Ref Range   Prostate Specific Ag, Serum 0.8 0.0 - 4.0 ng/mL       Pertinent labs & imaging results that were available during my care of the patient were reviewed by me and considered in my medical decision making.  Assessment & Plan:  Lavontay was seen today for tick bites.  Diagnoses and all orders for this visit:  Tick bite of right lower leg, initial encounter Tick bite of left  lower leg, initial encounter Rash Several tick bites with surrounding erythema. Will check for RMSF and Lyme Disease. Will empirically treat with doxycycline. Triamcinolone cream as needed for pruritis.  -     doxycycline (VIBRA-TABS) 100 MG tablet; Take 1 tablet (100 mg total) by mouth 2 (two) times daily for 21 days. 1 po bid -     Rocky mtn spotted fvr abs pnl(IgG+IgM) -     Lyme Disease Serology w/Reflex -     triamcinolone cream (KENALOG) 0.1 %; Apply 1 application topically 2 (two) times daily.  Continue all other maintenance medications.  Follow up plan: Return if symptoms worsen or fail to improve.   Continue healthy lifestyle choices, including diet (rich in fruits, vegetables, and lean proteins, and low in salt and simple carbohydrates) and exercise (at least 30 minutes of moderate physical activity daily).  Educational handout given for tick bite  The above assessment and management plan was discussed with the patient. The patient verbalized understanding of and has agreed to the management plan. Patient is aware to call the clinic if they develop any new symptoms or if symptoms persist or worsen. Patient is aware when to return to the clinic for a follow-up visit. Patient educated on when it is appropriate to go to the emergency department.   Monia Pouch, FNP-C [facility] [phone]

## 2021-01-19 ENCOUNTER — Ambulatory Visit (INDEPENDENT_AMBULATORY_CARE_PROVIDER_SITE_OTHER): Payer: Medicare Other | Admitting: Family Medicine

## 2021-01-19 ENCOUNTER — Encounter: Payer: Self-pay | Admitting: Family Medicine

## 2021-01-19 VITALS — BP 123/75 | HR 78 | Temp 97.1°F | Ht 74.0 in | Wt 217.0 lb

## 2021-01-19 DIAGNOSIS — R202 Paresthesia of skin: Secondary | ICD-10-CM | POA: Diagnosis not present

## 2021-01-19 DIAGNOSIS — R739 Hyperglycemia, unspecified: Secondary | ICD-10-CM | POA: Diagnosis not present

## 2021-01-19 DIAGNOSIS — R200 Anesthesia of skin: Secondary | ICD-10-CM

## 2021-01-19 DIAGNOSIS — R252 Cramp and spasm: Secondary | ICD-10-CM | POA: Diagnosis not present

## 2021-01-19 LAB — BAYER DCA HB A1C WAIVED: HB A1C (BAYER DCA - WAIVED): 5.6 % (ref <7.0)

## 2021-01-19 NOTE — Patient Instructions (Signed)
Looking for metabolic causes.  Agree with massage.  Plan for MRI/ Nerve conduction studies if no improvement. Paresthesia Paresthesia is an abnormal burning or prickling sensation. It is usually felt in the hands, arms, legs, or feet. However, it may occur in any part of the body. Usually, paresthesia is not painful. It may feel like: Tingling or numbness. Buzzing. Itching. Paresthesia may occur without any clear cause, or it may be caused by: Breathing too quickly (hyperventilation). Pressure on a nerve. An underlying medical condition. Side effects of a medication. Nutritional deficiencies. Exposure to toxic chemicals. Most people experience temporary (transient) paresthesia at some time in their lives. For some people, it may be long-lasting (chronic) because of an underlying medical condition. If you have paresthesia thatlasts a long time, you need to be evaluated by your health care provider. Follow these instructions at home: Alcohol use  Do not drink alcohol if: Your health care provider tells you not to drink. You are pregnant, may be pregnant, or are planning to become pregnant. If you drink alcohol: Limit how much you use to: 0-1 drink a day for women. 0-2 drinks a day for men. Be aware of how much alcohol is in your drink. In the [place] one drink equals one 12 oz bottle of beer (355 mL), one 5 oz glass of wine (148 mL), or one 1 oz glass of hard liquor (44 mL).  Nutrition  Eat a healthy diet. This includes: Eating foods that are high in fiber, such as fresh fruits and vegetables, whole grains, and beans. Limiting foods that are high in fat and processed sugars, such as fried or sweet foods.  General instructions Take over-the-counter and prescription medicines only as told by your health care provider. Do not use any products that contain nicotine or tobacco, such as cigarettes and e-cigarettes. These can keep blood from reaching damaged nerves. If you need help quitting,  ask your health care provider. If you have diabetes, work closely with your health care provider to keep your blood sugar under control. If you have numbness in your feet: Check every day for signs of injury or infection. Watch for redness, warmth, and swelling. Wear padded socks and comfortable shoes. These help protect your feet. Keep all follow-up visits as told by your health care provider. This is important. Contact a health care provider if you: Have paresthesia that gets worse or does not go away. Have numbness after an injury. Have a burning or prickling feeling that gets worse when you walk. Have pain, cramps, or dizziness, or you faint. Develop a rash. Get help right away if you: Feel muscle weakness. Develop new weakness in an arm or leg. Have trouble walking or moving. Have problems with speech, understanding, or vision. Feel confused. Cannot control your bladder or bowel movements. Summary Paresthesia is an abnormal burning or prickling sensation that is usually felt in the hands, arms, legs, or feet. It may also occur in other parts of the body. Paresthesia may occur without any clear cause, or it may be caused by breathing too quickly (hyperventilation), pressure on a nerve, an underlying medical condition, side effects of a medication, nutritional deficiencies, or exposure to toxic chemicals. If you have paresthesia that lasts a long time, you need to be evaluated by your health care provider. This information is not intended to replace advice given to you by your health care provider. Make sure you discuss any questions you have with your healthcare provider. Document Revised: 03/09/2020 Document Reviewed: 03/09/2020  Elsevier Patient Education  2022 [employer]  

## 2021-01-19 NOTE — Progress Notes (Signed)
Subjective: CC: Numbness and tingling PCP: [name], DO [id number] Jordan [name] is a 75 y.o. male presenting to clinic today for:  1.  Numbness and tingling Patient reports numbness and tingling in his feet.  Onset earlier this spring.  This initially started in the right foot but now has progressed into the left foot.  At nighttime his right foot curls under.  He denies any feeling of cramping or pain but has noticed this and it has been present during sleep.  After he is walked around a little while the cramping in the foot will go away but the numbness continues.  He has had some similar symptoms in the left upper extremity but does find this to be dependent on him sleeping on that side.  He tries to sleep on his side due to CPAP use.  He otherwise remains very physically active using his elliptical for 40 to 50 minutes/day.  He has some stiffness but otherwise is able to get around normally.  Medical history significant for lumbar and sacral surgery   ROS: Per HPI  Allergies  Allergen Reactions   Levofloxacin Other (See Comments)    Couldn't breathe, passed out   Lopid [Gemfibrozil] Other (See Comments)    Loose bowels   Sulfonamide Derivatives Hives   Past Medical History:  Diagnosis Date   Anxiety    Mild   Atrial fibrillation (HCC)    Paroxysmal   BPH (benign prostatic hyperplasia)    CAD (coronary artery disease)    Stent LAD, 2002  /  nuclear 2009, no ischemia  /    Hospital October, 2011 nuclear, no ischemia, possible slight apical scar, ejection fraction 70%   Carotid bruit    Doppler November, 2011, normal   Chest pain 02/23/2013   Chronic anticoagulation    Low CHADS , score, but patient wants to be aggressive   Colon polyps    Drug intolerance    Mild beta blocker intolerance   Dyslipidemia    Ejection fraction    EF 65-70%, echo, 2007 /  EF 70%, nuclear, 2011   Hyperlipidemia    IBS (irritable bowel syndrome)    Incomplete RBBB    Knee pain    left    Personal history of colonic adenomas 11/29/2006   Qualifier: Diagnosis of  By: Danny Lawless CMA, [place]     Syncope    November, 2010    Current Outpatient Medications:    apixaban (ELIQUIS) 5 MG TABS tablet, TAKE  (1)  TABLET TWICE A DAY., Disp: 180 tablet, Rfl: 1   atorvastatin (LIPITOR) 80 MG tablet, TAKE 1 TABLET DAILY AT 6PM, Disp: 90 tablet, Rfl: 3   Calcium Carb-Cholecalciferol (CALCIUM 600+D3 PO), Take 1 capsule by mouth daily., Disp: , Rfl:    carboxymethylcellulose (REFRESH PLUS) 0.5 % SOLN, Place 1 drop into both eyes 3 (three) times daily as needed. For dry eyes, Disp: , Rfl:    Cholecalciferol (VITAMIN D3) 1000 UNITS CAPS, Take 1,000 Units by mouth daily., Disp: , Rfl:    diltiazem (CARDIZEM CD) 180 MG 24 hr capsule, Take 1 capsule (180 mg total) by mouth daily., Disp: 90 capsule, Rfl: 3   diltiazem (CARDIZEM) 60 MG tablet, Take 1 tablet (60 mg total) by mouth as needed., Disp: 90 tablet, Rfl: 1   doxycycline (VIBRA-TABS) 100 MG tablet, Take 1 tablet (100 mg total) by mouth 2 (two) times daily for 21 days. 1 po bid, Disp: 42 tablet, Rfl: 0   fish  oil-omega-3 fatty acids 1000 MG capsule, Take 1 g by mouth daily., Disp: , Rfl:    Glucosamine-Chondroitin 1500-1200 MG/30ML LIQD, Take 1,500 mg by mouth daily., Disp: , Rfl:    Multiple Vitamins-Minerals (MULTIVITAMIN WITH MINERALS) tablet, Take 1 tablet by mouth daily., Disp: , Rfl:    nitroGLYCERIN (NITROSTAT) 0.4 MG SL tablet, Place 1 tablet (0.4 mg total) under the tongue every 5 (five) minutes as needed for chest pain., Disp: 25 tablet, Rfl: 3   Probiotic Product (ALIGN PO), Take 1 tablet by mouth daily. As needed, Disp: , Rfl:    triamcinolone cream (KENALOG) 0.1 %, Apply 1 application topically 2 (two) times daily., Disp: 30 g, Rfl: 0 Social History   Socioeconomic History   Marital status: Married    Spouse name: [name]    Number of children: 3   Years of education: bachelors   Highest education level: Not on file   Occupational History   Occupation: Development worker, international aid    Comment: Makes dental appliances  Tobacco Use   Smoking status: Never   Smokeless tobacco: Never  Vaping Use   Vaping Use: Never used  Substance and Sexual Activity   Alcohol use: No   Drug use: No   Sexual activity: Yes  Other Topics Concern   Not on file  Social History Narrative   Married, he has children and grandchildren.   L[place] on [address] acres, raises chickens, works as a Neurosurgeon    11/23/2015   Social Determinants of Health   Financial Resource Strain: Low Risk    Difficulty of Paying Living Expenses: Not hard at all  Food Insecurity: No Food Insecurity   Worried About Charity fundraiser in the Last Year: Never true   Arboriculturist in the Last Year: Never true  Transportation Needs: No Transportation Needs   Lack of Transportation (Medical): No   Lack of Transportation (Non-Medical): No  Physical Activity: Sufficiently Active   Days of Exercise per Week: 7 days   Minutes of Exercise per Session: 60 min  Stress: No Stress Concern Present   Feeling of Stress : Not at all  Social Connections: Socially Integrated   Frequency of Communication with Friends and Family: More than three times a week   Frequency of Social Gatherings with Friends and Family: More than three times a week   Attends Religious Services: More than 4 times per year   Active Member of [employer] or Organizations: Yes   Attends Music therapist: More than 4 times per year   Marital Status: Married  Human resources officer Violence: Not At Risk   Fear of Current or Ex-Partner: No   Emotionally Abused: No   Physically Abused: No   Sexually Abused: No   Family History  Problem Relation Age of Onset   Heart attack Mother    Diabetes Mother    Heart attack Brother    Heart disease Brother    Stroke Maternal Grandfather    Stroke Paternal Grandfather    Macular degeneration Sister    Cancer Daughter        colon    Obesity  Sister    Paranoid behavior Sister    Cancer Sister        lung - uterus    COPD Sister    Colon cancer Neg Hx     Objective: Office vital signs reviewed. BP 123/75   Pulse 78   Temp (!) 97.1 F (36.2 C)   Ht  $'6\' 2"'c$  (1.88 m)   Wt 217 lb (98.4 kg)   SpO2 95%   BMI 27.86 kg/m   Physical Examination:  General: Awake, alert, well nourished, No acute distress MSK: Independent gait and normal station.  Normal tone Neuro: Vibratory sensation intact.  Assessment/ Plan: 75 y.o. male   1. Numbness and tingling of both feet Uncertain etiology.  He had normal vibratory sensation on exam.  Will check B12, magnesium, BMP and A1c.  I have little reason to believe that he is a diabetic.  We discussed consideration for repeat MRI of his lumbar spine given history of discectomy in the lumbosacral region of his back several years ago.  He does not seem like he is having any other concerning symptoms or signs including weakness, bowel or urinary habit changes.  He has not really been having pain.  We will plan to reconvene once these labs are back.  Okay to go for massage therapy if he would like in the interim - Magnesium - Basic Metabolic Panel - Vitamin 123456 - Bayer DCA Hb A1c Waived  2. Cramping of feet Check metabolic labs as above.  May be a manifestation of lumbar radiculopathy - Magnesium - Basic Metabolic Panel - Vitamin 123456   No orders of the defined types were placed in this encounter.  No orders of the defined types were placed in this encounter.    [name], DO [place] [phone]

## 2021-01-20 LAB — BASIC METABOLIC PANEL
BUN/Creatinine Ratio: 23 (ref 10–24)
BUN: 27 mg/dL (ref 8–27)
CO2: 24 mmol/L (ref 20–29)
Calcium: 9.1 mg/dL (ref 8.6–10.2)
Chloride: 104 mmol/L (ref 96–106)
Creatinine, Ser: 1.17 mg/dL (ref 0.76–1.27)
Glucose: 93 mg/dL (ref 65–99)
Potassium: 4.8 mmol/L (ref 3.5–5.2)
Sodium: 140 mmol/L (ref 134–144)
eGFR: 66 mL/min/{1.73_m2} (ref >59)

## 2021-01-20 LAB — MAGNESIUM: Magnesium: 2 mg/dL (ref 1.6–2.3)

## 2021-01-20 LAB — VITAMIN B12: Vitamin B-12: 797 pg/mL (ref 232–1245)

## 2021-01-21 LAB — LYME DISEASE SEROLOGY W/REFLEX: Lyme Total Antibody EIA: NEGATIVE

## 2021-01-21 LAB — ROCKY MTN SPOTTED FVR ABS PNL(IGG+IGM)
RMSF IgG: NEGATIVE
RMSF IgM: 0.38 index (ref 0.00–0.89)

## 2021-01-27 ENCOUNTER — Encounter: Payer: Self-pay | Admitting: *Deleted

## 2021-02-02 ENCOUNTER — Ambulatory Visit: Payer: Medicare Other | Admitting: Family Medicine

## 2021-02-09 DIAGNOSIS — G4733 Obstructive sleep apnea (adult) (pediatric): Secondary | ICD-10-CM | POA: Diagnosis not present

## 2021-03-03 DIAGNOSIS — L821 Other seborrheic keratosis: Secondary | ICD-10-CM | POA: Diagnosis not present

## 2021-03-03 DIAGNOSIS — Z85828 Personal history of other malignant neoplasm of skin: Secondary | ICD-10-CM | POA: Diagnosis not present

## 2021-03-03 DIAGNOSIS — D225 Melanocytic nevi of trunk: Secondary | ICD-10-CM | POA: Diagnosis not present

## 2021-03-03 DIAGNOSIS — L570 Actinic keratosis: Secondary | ICD-10-CM | POA: Diagnosis not present

## 2021-03-11 DIAGNOSIS — G4733 Obstructive sleep apnea (adult) (pediatric): Secondary | ICD-10-CM | POA: Diagnosis not present

## 2021-03-22 DIAGNOSIS — M47816 Spondylosis without myelopathy or radiculopathy, lumbar region: Secondary | ICD-10-CM | POA: Diagnosis not present

## 2021-04-05 ENCOUNTER — Encounter: Payer: Self-pay | Admitting: Internal Medicine

## 2021-04-11 DIAGNOSIS — G4733 Obstructive sleep apnea (adult) (pediatric): Secondary | ICD-10-CM | POA: Diagnosis not present

## 2021-04-20 ENCOUNTER — Encounter: Payer: Self-pay | Admitting: Family Medicine

## 2021-04-20 ENCOUNTER — Other Ambulatory Visit: Payer: Self-pay

## 2021-04-20 ENCOUNTER — Ambulatory Visit (INDEPENDENT_AMBULATORY_CARE_PROVIDER_SITE_OTHER): Payer: Medicare Other | Admitting: Family Medicine

## 2021-04-20 VITALS — BP 111/73 | HR 67 | Temp 97.2°F | Ht 74.0 in | Wt 214.0 lb

## 2021-04-20 DIAGNOSIS — I480 Paroxysmal atrial fibrillation: Secondary | ICD-10-CM

## 2021-04-20 DIAGNOSIS — Z9889 Other specified postprocedural states: Secondary | ICD-10-CM

## 2021-04-20 DIAGNOSIS — R972 Elevated prostate specific antigen [PSA]: Secondary | ICD-10-CM

## 2021-04-20 DIAGNOSIS — K432 Incisional hernia without obstruction or gangrene: Secondary | ICD-10-CM

## 2021-04-20 DIAGNOSIS — R200 Anesthesia of skin: Secondary | ICD-10-CM

## 2021-04-20 DIAGNOSIS — R202 Paresthesia of skin: Secondary | ICD-10-CM

## 2021-04-20 MED ORDER — DILTIAZEM HCL 60 MG PO TABS: 60.0000 mg | ORAL_TABLET | ORAL | 1 refills | Status: DC | PRN

## 2021-04-20 MED ORDER — APIXABAN 5 MG PO TABS: ORAL_TABLET | ORAL | 3 refills | Status: DC

## 2021-04-20 MED ORDER — DILTIAZEM HCL ER COATED BEADS 180 MG PO CP24: 180.0000 mg | ORAL_CAPSULE | Freq: Every day | ORAL | 3 refills | Status: DC

## 2021-04-20 NOTE — Progress Notes (Signed)
Subjective: CC: Abdominal mass PCP: Jordan Norlander, DO Jordan [name] is a 75 y.o. male presenting to clinic today for:  1.  Abdominal mass Patient reports that he has noticed a spot on his belly that seems to be more prominent after eating.  Sometimes he can feel a gurgling sensation in this area.  It is in the area of his previous surgery.  He denies any hematochezia, nausea, vomiting.  2.  A. fib No reports of heart palpitations or bleeding.  Needs refills on Eliquis and Cardizem.  Has an appointment scheduled with cardiology soon  3.  History of prostate surgery, elevated PSA Will be seeing his urologist in December.  He reports overall urine output seems to be pretty good and he has less incontinence.  4.  Neuropathy Continues to have neuropathy in the feet, which seems slightly worse and in the hands.  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  He is not currently treated with any medications for this as they want to wait until after the study.  Symptoms seem to be worse when he does not wear his compression hose.   ROS: Per HPI  Allergies  Allergen Reactions   Levofloxacin Other (See Comments)    Couldn't breathe, passed out   Lopid [Gemfibrozil] Other (See Comments)    Loose bowels   Sulfonamide Derivatives Hives   Past Medical History:  Diagnosis Date   Anxiety    Mild   Atrial fibrillation (HCC)    Paroxysmal   BPH (benign prostatic hyperplasia)    CAD (coronary artery disease)    Stent LAD, 2002  /  nuclear 2009, no ischemia  /    Hospital October, 2011 nuclear, no ischemia, possible slight apical scar, ejection fraction 70%   Carotid bruit    Doppler November, 2011, normal   Chest pain 02/23/2013   Chronic anticoagulation    Low CHADS , score, but patient wants to be aggressive   Colon polyps    Drug  intolerance    Mild beta blocker intolerance   Dyslipidemia    Ejection fraction    EF 65-70%, echo, 2007 /  EF 70%, nuclear, 2011   Hyperlipidemia    IBS (irritable bowel syndrome)    Incomplete RBBB    Knee pain    left   Personal history of colonic adenomas 11/29/2006   Qualifier: Diagnosis of  By: Danny Lawless CMA, [place]     Syncope    November, 2010    Current Outpatient Medications:    apixaban (ELIQUIS) 5 MG TABS tablet, TAKE  (1)  TABLET TWICE A DAY., Disp: 180 tablet, Rfl: 1   atorvastatin (LIPITOR) 80 MG tablet, TAKE 1 TABLET DAILY AT 6PM, Disp: 90 tablet, Rfl: 3   Calcium Carb-Cholecalciferol (CALCIUM 600+D3 PO), Take 1 capsule by mouth daily., Disp: , Rfl:    carboxymethylcellulose (REFRESH PLUS) 0.5 % SOLN, Place 1 drop into both eyes 3 (three) times daily as needed. For dry eyes, Disp: , Rfl:    Cholecalciferol (VITAMIN D3) 1000 UNITS CAPS, Take 1,000 Units by mouth daily., Disp: , Rfl:    diltiazem (CARDIZEM CD) 180 MG 24 hr capsule, Take 1 capsule (180 mg total) by mouth daily., Disp: 90 capsule, Rfl: 3   diltiazem (CARDIZEM) 60 MG tablet, Take 1 tablet (60 mg total) by mouth as needed., Disp:  90 tablet, Rfl: 1   fish oil-omega-3 fatty acids 1000 MG capsule, Take 1 g by mouth daily., Disp: , Rfl:    Glucosamine-Chondroitin 1500-1200 MG/30ML LIQD, Take 1,500 mg by mouth daily., Disp: , Rfl:    Multiple Vitamins-Minerals (MULTIVITAMIN WITH MINERALS) tablet, Take 1 tablet by mouth daily., Disp: , Rfl:    nitroGLYCERIN (NITROSTAT) 0.4 MG SL tablet, Place 1 tablet (0.4 mg total) under the tongue every 5 (five) minutes as needed for chest pain., Disp: 25 tablet, Rfl: 3   Probiotic Product (ALIGN PO), Take 1 tablet by mouth daily. As needed, Disp: , Rfl:    triamcinolone cream (KENALOG) 0.1 %, Apply 1 application topically 2 (two) times daily., Disp: 30 g, Rfl: 0 Social History   Socioeconomic History   Marital status: Married    Spouse name: Jordan [name]    Number of children: 3    Years of education: bachelors   Highest education level: Not on file  Occupational History   Occupation: Development worker, international aid    Comment: Makes dental appliances  Tobacco Use   Smoking status: Never   Smokeless tobacco: Never  Vaping Use   Vaping Use: Never used  Substance and Sexual Activity   Alcohol use: No   Drug use: No   Sexual activity: Yes  Other Topics Concern   Not on file  Social History Narrative   Married, he has children and grandchildren.   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   Social Determinants of Health   Financial Resource Strain: Low Risk    Difficulty of Paying Living Expenses: Not hard at all  Food Insecurity: No Food Insecurity   Worried About Charity fundraiser in the Last Year: Never true   Arboriculturist in the Last Year: Never true  Transportation Needs: No Transportation Needs   Lack of Transportation (Medical): No   Lack of Transportation (Non-Medical): No  Physical Activity: Sufficiently Active   Days of Exercise per Week: 7 days   Minutes of Exercise per Session: 60 min  Stress: No Stress Concern Present   Feeling of Stress : Not at all  Social Connections: Socially Integrated   Frequency of Communication with Friends and Family: More than three times a week   Frequency of Social Gatherings with Friends and Family: More than three times a week   Attends Religious Services: More than 4 times per year   Active Member of [employer] or Organizations: Yes   Attends Music therapist: More than 4 times per year   Marital Status: Married  Human resources officer Violence: Not At Risk   Fear of Current or Ex-Partner: No   Emotionally Abused: No   Physically Abused: No   Sexually Abused: No   Family History  Problem Relation Age of Onset   Heart attack Mother    Diabetes Mother    Heart attack Brother    Heart disease Brother    Stroke Maternal Grandfather    Stroke Paternal Grandfather    Macular  degeneration Sister    Cancer Daughter        colon    Obesity Sister    Paranoid behavior Sister    Cancer Sister        lung - uterus    COPD Sister    Colon cancer Neg Hx     Objective: Office vital signs reviewed. BP 111/73   Pulse 67   Temp Marland Kitchen)  97.2 F (36.2 C)   Ht 6\' 2"  (1.28 m)   Wt 214 lb (97.1 kg)   SpO2 98%   BMI 27.48 kg/m   Physical Examination:  General: Awake, alert, well nourished, No acute distress HEENT: Sclera white.  MMM Cardio: regular rate and rhythm, S1S2 heard, no murmurs appreciated Pulm: clear to auscultation bilaterally, no wheezes, rhonchi or rales; normal work of breathing on room air GI: Soft, nontender.  Gumball sized hernia appreciated in the umbilical/Supraumbilical space.  This is reducible.  Assessment/ Plan: 75 y.o. male   Incisional hernia, without obstruction or gangrene  Paroxysmal atrial fibrillation (HCC) - Plan: apixaban (ELIQUIS) 5 MG TABS tablet, diltiazem (CARDIZEM CD) 180 MG 24 hr capsule, diltiazem (CARDIZEM) 60 MG tablet  Numbness and tingling of both feet  History of lumbar surgery  Elevated PSA - Plan: PSA, CANCELED: PSA  History of prostate surgery - Plan: PSA, CANCELED: PSA  Physical exam is consistent with an incisional hernia that is umbilical/periumbilical.  I anticipate referral to general surgery but he would like to discuss with daughter who she is seeing for her incisional hernia and he will get back to me.  At this time no evidence of red flag signs or symptoms but we discussed red flag signs or symptoms that warrant emergent evaluation.  He voiced good understanding  He had both rate and rhythm controlled today.  Eliquis and Cardizem has been renewed.  Keep follow-up with cardiology  Numbness and tingling of both feet will be evaluated under nerve conduction study soon with his podiatrist.  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  Wonder if he would benefit from gabapentin or Lyrica  PSA  ordered in anticipation of his December appointment with urology.  He will come in in December prior to that visit to have this lab collected so that has resulted by the time he sees Dr Greer Pickerel  No orders of the defined types were placed in this encounter.  No orders of the defined types were placed in this encounter.    Jordan Norlander, DO [facility] [phone]

## 2021-04-21 ENCOUNTER — Ambulatory Visit: Payer: Medicare Other | Admitting: Family Medicine

## 2021-04-21 ENCOUNTER — Telehealth: Payer: Self-pay | Admitting: Family Medicine

## 2021-04-21 NOTE — Telephone Encounter (Signed)
REFERRAL REQUEST Telephone Note  Have you been seen at our office for this problem? YES (Advise that they may need an appointment with their PCP before a referral can be done)  Reason for Referral: Hernia Referral discussed with patient: YES  Best contact number of patient for referral team: [phone]    Has patient been seen by a specialist for this issue before: NO  Patient provider preference for referral: Dr. Karleen Hampshire Patient location preference for referral: [place] P# [phone]   Patient notified that referrals can take up to a week or longer to process. If they haven't heard anything within a week they should call back and speak with the referral department.

## 2021-04-22 ENCOUNTER — Other Ambulatory Visit: Payer: Self-pay | Admitting: Family Medicine

## 2021-04-22 DIAGNOSIS — K432 Incisional hernia without obstruction or gangrene: Secondary | ICD-10-CM

## 2021-04-22 NOTE — Telephone Encounter (Signed)
done

## 2021-06-01 ENCOUNTER — Ambulatory Visit: Payer: Medicare Other | Admitting: Internal Medicine

## 2021-06-01 ENCOUNTER — Encounter: Payer: Self-pay | Admitting: Internal Medicine

## 2021-06-01 VITALS — BP 120/70 | HR 65 | Ht 74.0 in | Wt 217.0 lb

## 2021-06-01 DIAGNOSIS — Z7901 Long term (current) use of anticoagulants: Secondary | ICD-10-CM

## 2021-06-01 DIAGNOSIS — K581 Irritable bowel syndrome with constipation: Secondary | ICD-10-CM

## 2021-06-01 DIAGNOSIS — Z86010 Personal history of colonic polyps: Secondary | ICD-10-CM | POA: Diagnosis not present

## 2021-06-01 DIAGNOSIS — Z800 Family history of malignant neoplasm of digestive organs: Secondary | ICD-10-CM

## 2021-06-01 DIAGNOSIS — I480 Paroxysmal atrial fibrillation: Secondary | ICD-10-CM

## 2021-06-01 DIAGNOSIS — K432 Incisional hernia without obstruction or gangrene: Secondary | ICD-10-CM

## 2021-06-01 NOTE — Patient Instructions (Signed)
We will put you in our system for a May 2023 colonoscopy recall.  Elta Guadeloupe this on your calendar as well.  Good luck with your hernia surgery in February.  Avoid foods that make you feel bad.  Read Metabolical by Dr Hali Marry.  I appreciate the opportunity to care for you. Silvano Rusk, MD, [facility]

## 2021-06-01 NOTE — Progress Notes (Signed)
Jordan [name] 75 y.o. May 22, 1946 [id number]  Assessment & Plan:   Encounter Diagnoses  Name Primary?   Personal history of colonic adenomas Yes   Paroxysmal atrial fibrillation ([place])    Long term (current) use of anticoagulants    Incisional hernia, without obstruction or gangrene - periumbilical    Irritable bowel syndrome with constipation possible    Family history of colon cancer - daughter     I think he should have his hernia repaired before we do a colonoscopy.  We will plan on a recall in May 2023.  Office visit to regroup at that time.  Regarding his irritable bowel syndrome symptoms and processed food ingestion etc. I have recommended eliminating processed food.  I recommended he read the book Metabolical to try to understand this better.  CC: Jordan Norlander, DO   Subjective:   Chief Complaint: History of colon polyps time for colonoscopy  HPI The patient is a 75 year old white man known to me with a history of colon polyps who is due for a 5-year surveillance procedure with last exam 12/30/2015.  In the interim he has had a prostatectomy, robotic minimally invasive surgery at [facility] and has a subsequent periumbilical incisional hernia that will be repaired in February.  This is bothersome and often causes pain and problems after eating.  He tells me that processed foods cause constipation and bloating and tenderness at the hernia site.  He does not drink sugary sweetened beverages.  He often eats well and eats organic and unprocessed foods but sometimes he does eat processed foods and these tend to cause symptoms.  Reminds me that his oldest daughter had stage IV colon cancer and after colon and liver resection and chemotherapy she is in remission and back to work as a Marine scientist.  Son and other daughter have had colonoscopies and have polyps as well.    Polyp history for this patient:  2006 - > 1 cm TV adenoma 2008 - 6 mm adenoma (cecum) 11/07/2012 6 right colon  polyps largest 8 mm - adenomas  12/2015 6 mm adenoma  He is on Eliquis for atrial fibrillation Allergies  Allergen Reactions   Levofloxacin Other (See Comments)    Couldn't breathe, passed out   Lopid [Gemfibrozil] Other (See Comments)    Loose bowels   Sulfonamide Derivatives Hives   Current Meds  Medication Sig   apixaban (ELIQUIS) 5 MG TABS tablet TAKE  (1)  TABLET TWICE A DAY.   atorvastatin (LIPITOR) 80 MG tablet TAKE 1 TABLET DAILY AT 6PM   Calcium Carb-Cholecalciferol (CALCIUM 600+D3 PO) Take 1 capsule by mouth daily.   carboxymethylcellulose (REFRESH PLUS) 0.5 % SOLN Place 1 drop into both eyes 3 (three) times daily as needed. For dry eyes   Cholecalciferol (VITAMIN D3) 1000 UNITS CAPS Take 1,000 Units by mouth daily.   diltiazem (CARDIZEM CD) 180 MG 24 hr capsule Take 1 capsule (180 mg total) by mouth daily.   diltiazem (CARDIZEM) 60 MG tablet Take 1 tablet (60 mg total) by mouth as needed.   Glucosamine-Chondroitin 1500-1200 MG/30ML LIQD Take 1,500 mg by mouth daily.   Multiple Vitamins-Minerals (MULTIVITAMIN WITH MINERALS) tablet Take 1 tablet by mouth daily.   nitroGLYCERIN (NITROSTAT) 0.4 MG SL tablet Place 1 tablet (0.4 mg total) under the tongue every 5 (five) minutes as needed for chest pain.   Probiotic Product (ALIGN PO) Take 1 tablet by mouth daily. As needed   triamcinolone cream (KENALOG) 0.1 % Apply 1 application  topically 2 (two) times daily.   Past Medical History:  Diagnosis Date   Anxiety    Mild   Atrial fibrillation (HCC)    Paroxysmal   BPH (benign prostatic hyperplasia)    CAD (coronary artery disease)    Stent LAD, 2002  /  nuclear 2009, no ischemia  /    Hospital October, 2011 nuclear, no ischemia, possible slight apical scar, ejection fraction 70%   Carotid bruit    Doppler November, 2011, normal   Chest pain 02/23/2013   Chronic anticoagulation    Low CHADS , score, but patient wants to be aggressive   Colon polyps    COVID-19    + long  Covid problems   Drug intolerance    Mild beta blocker intolerance   Dyslipidemia    Ejection fraction    EF 65-70%, echo, 2007 /  EF 70%, nuclear, 2011   Hyperlipidemia    IBS (irritable bowel syndrome)    Incomplete RBBB    Knee pain    left   Personal history of colonic adenomas 11/29/2006   Qualifier: Diagnosis of  By: Danny Lawless CMA, [place]     Syncope    November, 2010   Past Surgical History:  Procedure Laterality Date   BACK SURGERY  05/1990   [id number]   CARDIAC CATHETERIZATION     COLONOSCOPY  multiple   PROSTATE SURGERY     Social History   Social History Narrative   Married, he has children and grandchildren.   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   Review of Systems As per HPI.  He does have some urinary incontinence after his prostatectomy otherwise negative.  Objective:   Physical Exam BP 120/70    Pulse 65    Ht 6\' 2"  (1.88 m)    Wt 217 lb (98.4 kg)    BMI 27.86 kg/m  Elderly white man no acute distress Abdominal exam reveals a small slightly firm reducible periumbilical hernia that is mildly tender. He is alert and oriented x3.

## 2021-06-02 ENCOUNTER — Encounter: Payer: Self-pay | Admitting: Internal Medicine

## 2021-06-08 IMAGING — MR MR PROSTATE WO/W CM
56 series · 56 of 56 positions shown · IV contrast (20ml Multihance)
Comparison: None.

CLINICAL DATA: Elevated PSA level of 16.80 on 04/03/2019

EXAM:
MR PROSTATE WITHOUT AND WITH CONTRAST
TECHNIQUE: Multiplanar multisequence MRI images were obtained of the pelvis
centered about the prostate. Pre and post contrast images were
obtained.
CONTRAST:  20mL MULTIHANCE GADOBENATE DIMEGLUMINE 529 MG/ML IV SOLN

[Series 3: T1 · axial · 8.0mm · 1.06mm/px · 1 of 28 slices shown (1 of 2)]
[im 1/28]
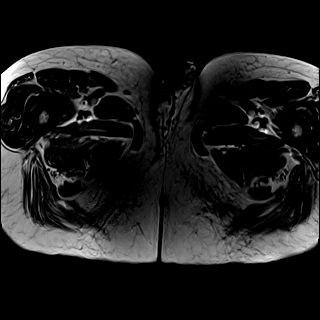

[Series 4: bSSFP fat-sat · axial · 8.0mm · 0.74mm/px · 1 of 28 slices shown]
[im 1/28]
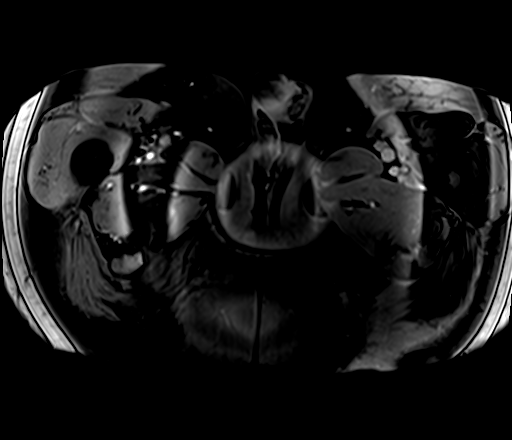

[Series 5: T2 · sagittal · 3.5mm · 0.56mm/px · 1 of 39 slices shown (1 of 4)]
[im 1/39]
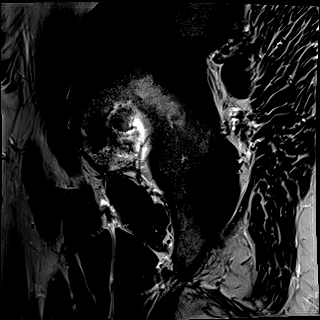

[Series 6: T1 · axial · 3.0mm · 0.31mm/px · 1 of 30 slices shown (2 of 2)]
[im 1/30]
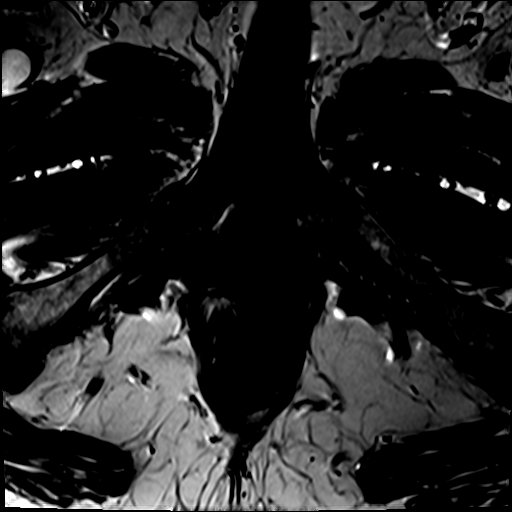

[Series 7: T2 · axial · 3.5mm · 0.56mm/px · 1 of 30 slices shown (2 of 4)]
[im 1/30]
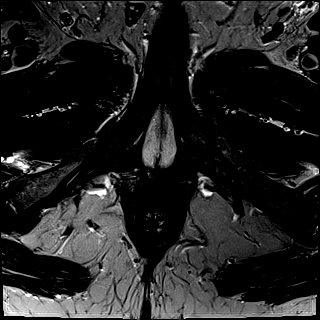

[Series 8: T2 · axial · 1.0mm · 1.04mm/px · 1 of 96 slices shown (3 of 4)]
[im 1/96]
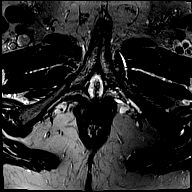

[Series 9: T2 · coronal · 3.5mm · 0.56mm/px · 1 of 23 slices shown (4 of 4)]
[im 1/23]
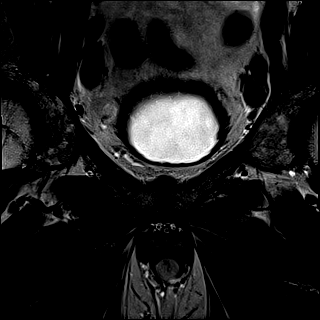

[Series 10: DWI · axial · 3.5mm · 1.56mm/px · 1 of 90 slices shown (1 of 2)]
[im 1/90]
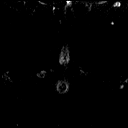

[Series 11: DWI · axial · 3.5mm · 1.56mm/px · 1 of 30 slices shown (2 of 2)]
[im 1/30]
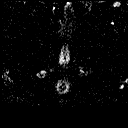

[Series 12: pre t1_twist_tra_dyn_ttc=6.4s · axial · non-contrast · 3.5mm · 0.83mm/px · 1 of 26 slices shown]
[im 1/26]
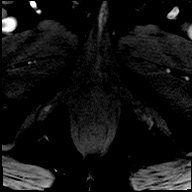

[Series 13: post t1_twist_tra_dyn-copy center · axial · 3.5mm · 0.83mm/px · 1 of 26 slices shown (1 of 24)]
[im 1/26]
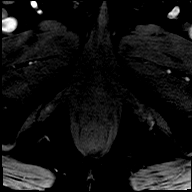

[Series 14: post t1_twist_tra_dyn-copy center · axial · 3.5mm · 0.83mm/px · 1 of 26 slices shown (2 of 24)]
[im 1/26]
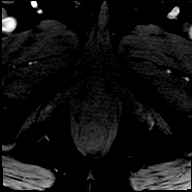

[Series 15: post t1_twist_tra_dyn-copy cent_sub_ttc=(id) · axial · 3.5mm · 0.83mm/px · 1 of 26 slices shown (1 of 22)]
[im 1/26]
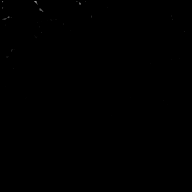

[Series 16: post t1_twist_tra_dyn-copy center · axial · 3.5mm · 0.83mm/px · 1 of 26 slices shown (3 of 24)]
[im 1/26]
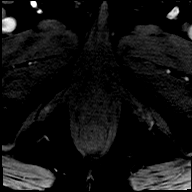

[Series 17: post t1_twist_tra_dyn-copy cent_sub_ttc=(id) · axial · 3.5mm · 0.83mm/px · 1 of 26 slices shown (2 of 22)]
[im 1/26]
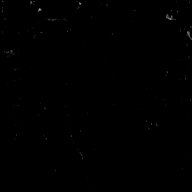

[Series 18: post t1_twist_tra_dyn-copy center · axial · 3.5mm · 0.83mm/px · 1 of 26 slices shown (4 of 24)]
[im 1/26]
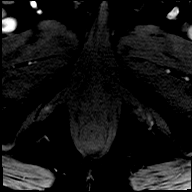

[Series 19: post t1_twist_tra_dyn-copy cent_sub_ttc=(id) · axial · 3.5mm · 0.83mm/px · 1 of 26 slices shown (3 of 22)]
[im 1/26]
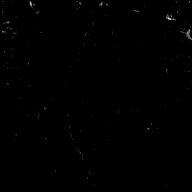

[Series 20: post t1_twist_tra_dyn-copy center · axial · 3.5mm · 0.83mm/px · 1 of 26 slices shown (5 of 24)]
[im 1/26]
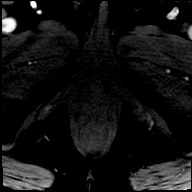

[Series 21: post t1_twist_tra_dyn-copy cent_sub_ttc=(id) · axial · 3.5mm · 0.83mm/px · 1 of 26 slices shown (4 of 22)]
[im 1/26]
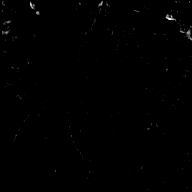

[Series 22: post t1_twist_tra_dyn-copy center · axial · 3.5mm · 0.83mm/px · 1 of 26 slices shown (6 of 24)]
[im 1/26]
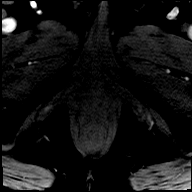

[Series 23: post t1_twist_tra_dyn-copy cent_sub_ttc=(id) · axial · 3.5mm · 0.83mm/px · 1 of 26 slices shown (5 of 22)]
[im 1/26]
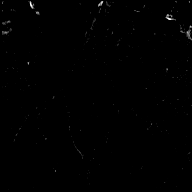

[Series 24: post t1_twist_tra_dyn-copy center · axial · 3.5mm · 0.83mm/px · 1 of 26 slices shown (7 of 24)]
[im 1/26]
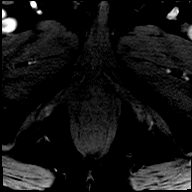

[Series 25: post t1_twist_tra_dyn-copy cent_sub_ttc=(id) · axial · 3.5mm · 0.83mm/px · 1 of 26 slices shown (6 of 22)]
[im 1/26]
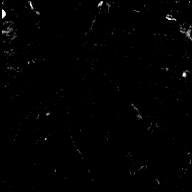

[Series 26: post t1_twist_tra_dyn-copy center · axial · 3.5mm · 0.83mm/px · 1 of 26 slices shown (8 of 24)]
[im 1/26]
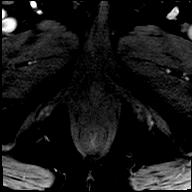

[Series 27: post t1_twist_tra_dyn-copy cent_sub_ttc=(id) · axial · 3.5mm · 0.83mm/px · 1 of 26 slices shown (7 of 22)]
[im 1/26]
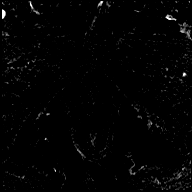

[Series 28: post t1_twist_tra_dyn-copy center · axial · 3.5mm · 0.83mm/px · 1 of 26 slices shown (9 of 24)]
[im 1/26]
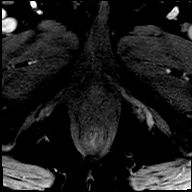

[Series 29: post t1_twist_tra_dyn-copy cent_sub_ttc=(id) · axial · 3.5mm · 0.83mm/px · 1 of 26 slices shown (8 of 22)]
[im 1/26]
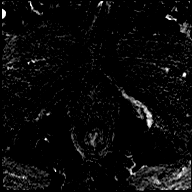

[Series 30: post t1_twist_tra_dyn-copy center · axial · 3.5mm · 0.83mm/px · 1 of 26 slices shown (10 of 24)]
[im 1/26]
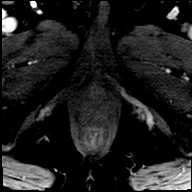

[Series 31: post t1_twist_tra_dyn-copy cent_sub_ttc=(id) · axial · 3.5mm · 0.83mm/px · 1 of 26 slices shown (9 of 22)]
[im 1/26]
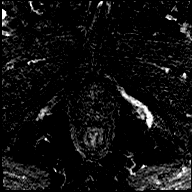

[Series 32: post t1_twist_tra_dyn-copy center · axial · 3.5mm · 0.83mm/px · 1 of 26 slices shown (11 of 24)]
[im 1/26]
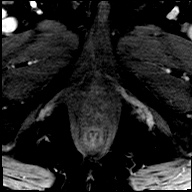

[Series 33: post t1_twist_tra_dyn-copy cent_sub_ttc=(id) · axial · 3.5mm · 0.83mm/px · 1 of 26 slices shown (10 of 22)]
[im 1/26]
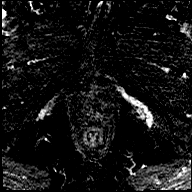

[Series 34: post t1_twist_tra_dyn-copy center · axial · 3.5mm · 0.83mm/px · 1 of 26 slices shown (12 of 24)]
[im 1/26]
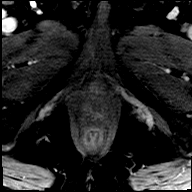

[Series 35: post t1_twist_tra_dyn-copy cent_sub_ttc=(id) · axial · 3.5mm · 0.83mm/px · 1 of 26 slices shown (11 of 22)]
[im 1/26]
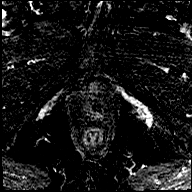

[Series 36: post t1_twist_tra_dyn-copy center · axial · 3.5mm · 0.83mm/px · 1 of 26 slices shown (13 of 24)]
[im 1/26]
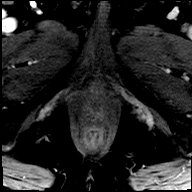

[Series 37: post t1_twist_tra_dyn-copy cent_sub_ttc=(id) · axial · 3.5mm · 0.83mm/px · 1 of 26 slices shown (12 of 22)]
[im 1/26]
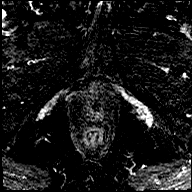

[Series 38: post t1_twist_tra_dyn-copy center · axial · 3.5mm · 0.83mm/px · 1 of 26 slices shown (14 of 24)]
[im 1/26]
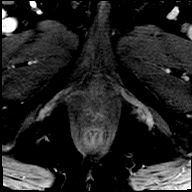

[Series 39: post t1_twist_tra_dyn-copy cent_sub_ttc=(id) · axial · 3.5mm · 0.83mm/px · 1 of 26 slices shown (13 of 22)]
[im 1/26]
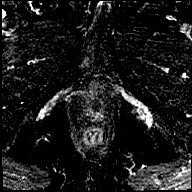

[Series 40: post t1_twist_tra_dyn-copy center · axial · 3.5mm · 0.83mm/px · 1 of 26 slices shown (15 of 24)]
[im 1/26]
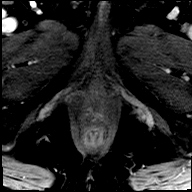

[Series 41: post t1_twist_tra_dyn-copy cent_sub_ttc=(id) · axial · 3.5mm · 0.83mm/px · 1 of 26 slices shown (14 of 22)]
[im 1/26]
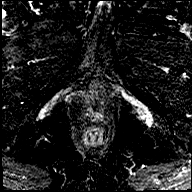

[Series 42: post t1_twist_tra_dyn-copy center · axial · 3.5mm · 0.83mm/px · 1 of 26 slices shown (16 of 24)]
[im 1/26]
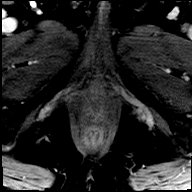

[Series 43: post t1_twist_tra_dyn-copy cent_sub_ttc=(id) · axial · 3.5mm · 0.83mm/px · 1 of 25 slices shown (15 of 22)]
[im 1/25]
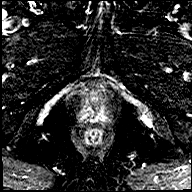

[Series 44: post t1_twist_tra_dyn-copy center · axial · 3.5mm · 0.83mm/px · 1 of 26 slices shown (17 of 24)]
[im 1/26]
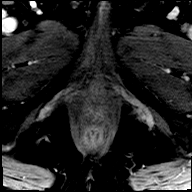

[Series 45: post t1_twist_tra_dyn-copy cent_sub_ttc=(id) · axial · 3.5mm · 0.83mm/px · 1 of 26 slices shown (16 of 22)]
[im 1/26]
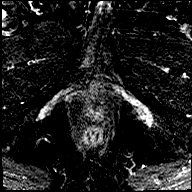

[Series 46: post t1_twist_tra_dyn-copy center · axial · 3.5mm · 0.83mm/px · 1 of 26 slices shown (18 of 24)]
[im 1/26]
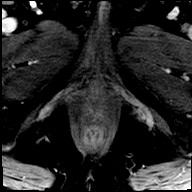

[Series 47: post t1_twist_tra_dyn-copy cent_sub_ttc=(id) · axial · 3.5mm · 0.83mm/px · 1 of 26 slices shown (17 of 22)]
[im 1/26]
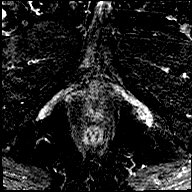

[Series 48: post t1_twist_tra_dyn-copy center · axial · 3.5mm · 0.83mm/px · 1 of 26 slices shown (19 of 24)]
[im 1/26]
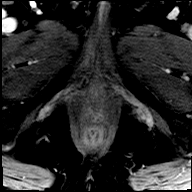

[Series 49: post t1_twist_tra_dyn-copy cent_sub_ttc=(id) · axial · 3.5mm · 0.83mm/px · 1 of 26 slices shown (18 of 22)]
[im 1/26]
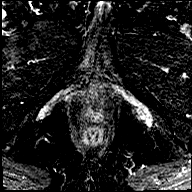

[Series 50: post t1_twist_tra_dyn-copy center · axial · 3.5mm · 0.83mm/px · 1 of 26 slices shown (20 of 24)]
[im 1/26]
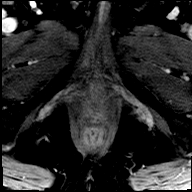

[Series 51: post t1_twist_tra_dyn-copy cent_sub_ttc=(id) · axial · 3.5mm · 0.83mm/px · 1 of 26 slices shown (19 of 22)]
[im 1/26]
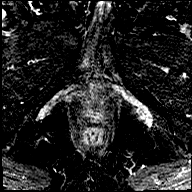

[Series 52: post t1_twist_tra_dyn-copy center · axial · 3.5mm · 0.83mm/px · 1 of 26 slices shown (21 of 24)]
[im 1/26]
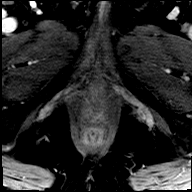

[Series 53: post t1_twist_tra_dyn-copy cent_sub_ttc=(id) · axial · 3.5mm · 0.83mm/px · 1 of 26 slices shown (20 of 22)]
[im 1/26]
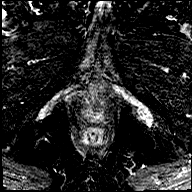

[Series 54: post t1_twist_tra_dyn-copy center · axial · 3.5mm · 0.83mm/px · 1 of 26 slices shown (22 of 24)]
[im 1/26]
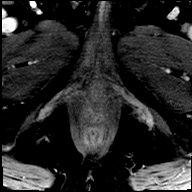

[Series 55: post t1_twist_tra_dyn-copy cent_sub_ttc=(id) · axial · 3.5mm · 0.83mm/px · 1 of 26 slices shown (21 of 22)]
[im 1/26]
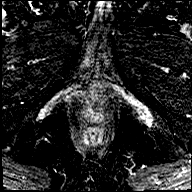

[Series 56: post t1_twist_tra_dyn-copy center · axial · 3.5mm · 0.83mm/px · 1 of 26 slices shown (23 of 24)]
[im 1/26]
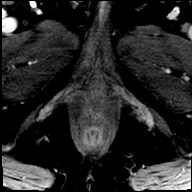

[Series 57: post t1_twist_tra_dyn-copy cent_sub_ttc=(id) · axial · 3.5mm · 0.83mm/px · 1 of 26 slices shown (22 of 22)]
[im 1/26]
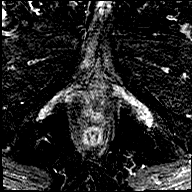

[Series 58: post t1_twist_tra_dyn-copy center · axial · 3.5mm · 0.83mm/px · 1 of 26 slices shown (24 of 24)]
[im 1/26]
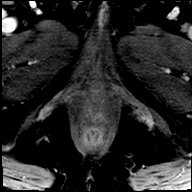

[56 of 56 positions shown; findings below may reference images not displayed]

FINDINGS: Prostate: Region of interest # 1: PI-RADS category 4 lesion in the
left posteromedial peripheral zone of the mid gland with reduced T2
and ADC map activity and mild early enhancement. This lesion
measures 2.2 by 1.3 by 0.9 cm (0.99 cubic cm).

There is some scattered mild linear low T2 signal in the remainder
of the peripheral zone, PI-RADS category 2.

There is evidence of extensive benign prostatic hypertrophy
accounting for the prostatomegaly.

Volume: 3D volumetric analysis: Prostate volume 135.46 cubic cm (7.1
by 5.7 by 7.5 cm).

Transcapsular spread:  Absent

Seminal vesicle involvement: Absent

Neurovascular bundle involvement: Absent

Pelvic adenopathy: Absent

Bone metastasis: Absent

Other findings: Lower lumbar degenerative facet arthropathy.
IMPRESSION: 1. PI-RADS category 4 lesion in the left posteromedial peripheral
zone of the mid gland. Appropriate targeting data sent to [facility].
2. Marked benign prostatic hypertrophy accounting for the
prostatomegaly.
3. Lower lumbar degenerative facet arthropathy.

## 2021-07-11 ENCOUNTER — Ambulatory Visit: Payer: Medicare Other | Admitting: Cardiology

## 2021-07-12 ENCOUNTER — Other Ambulatory Visit: Payer: Self-pay

## 2021-07-12 ENCOUNTER — Ambulatory Visit: Payer: Medicare Other | Admitting: Cardiology

## 2021-07-12 ENCOUNTER — Encounter: Payer: Self-pay | Admitting: Cardiology

## 2021-07-12 DIAGNOSIS — I2510 Atherosclerotic heart disease of native coronary artery without angina pectoris: Secondary | ICD-10-CM

## 2021-07-12 DIAGNOSIS — G4733 Obstructive sleep apnea (adult) (pediatric): Secondary | ICD-10-CM | POA: Diagnosis not present

## 2021-07-12 DIAGNOSIS — I480 Paroxysmal atrial fibrillation: Secondary | ICD-10-CM

## 2021-07-12 DIAGNOSIS — E785 Hyperlipidemia, unspecified: Secondary | ICD-10-CM

## 2021-07-12 DIAGNOSIS — Z9989 Dependence on other enabling machines and devices: Secondary | ICD-10-CM

## 2021-07-12 DIAGNOSIS — I4891 Unspecified atrial fibrillation: Secondary | ICD-10-CM

## 2021-07-12 DIAGNOSIS — D6869 Other thrombophilia: Secondary | ICD-10-CM | POA: Diagnosis not present

## 2021-07-12 NOTE — Patient Instructions (Signed)
Medication Instructions:   Your physician recommends that you continue on your current medications as directed. Please refer to the Current Medication list given to you today.  *If you need a refill on your cardiac medications before your next appointment, please call your pharmacy*   Lab [place]    If you have labs (blood work) drawn today and your tests are completely normal, you will receive your results only by: [place] (if you have MyChart) OR A paper copy in the mail If you have any lab test that is abnormal or we need to change your treatment, we will call you to review the results.   Testing/Procedures:  NONE ORDERED  TODAY     Follow-Up: At [facility], you and your health needs are our priority.  As part of our continuing mission to provide you with exceptional heart care, we have created designated Provider Care Teams.  These Care Teams include your primary Cardiologist (physician) and Advanced Practice Providers (APPs -  Physician Assistants and Nurse Practitioners) who all work together to provide you with the care you need, when you need it.  We recommend signing up for the patient portal called "MyChart".  Sign up information is provided on this After Visit Summary.  MyChart is used to connect with patients for Virtual Visits (Telemedicine).  Patients are able to view lab/test results, encounter notes, upcoming appointments, etc.  Non-urgent messages can be sent to your provider as well.   To learn more about what you can do with MyChart, go to [email or web address].    Your next appointment:  You have been referred to TO EP DEPARTMENT FOR ABLATION AND ECHOCARDIOGRAM WITH DR Luyando   1 year(s)  The format for your next appointment:   In Person  Provider:   Candee Furbish, MD     Other Instructions

## 2021-07-12 NOTE — Assessment & Plan Note (Signed)
On Eliquis.  Monitoring hemoglobin and creatinine with this high risk medication.  No adverse bleeding events.

## 2021-07-12 NOTE — Progress Notes (Signed)
Cardiology Office Note:    Date:  07/12/2021   ID:  Jordan [name], DOB July 28, 1945, MRN [id number]  PCP:  Jordan Norlander, DO   [facility] Providers Cardiologist:  Candee Furbish, MD     Referring MD: Jordan Norlander, DO    History of Present Illness:    Jordan [name] is a 76 y.o. male here for the follow-up of coronary disease paroxysmal atrial fibrillation. AFIB is very frustrating to him. He is very symptomatic, it knocks him out. More and more incidents. Knows when he is in it immediately.   Get's dizzy when stands up in the morning.   Feb 24 - had AFIB from 4-10pm July 9 - coming home from beach - took dilt 60 PRN. Helps if ice on neck Aug 26 - 3-11pm  Sep 27 - 6 to 10 pm Oct 1 - 8-12 noon Oct 3 - 6 am to noon Dec 18 - 3 pm to 1 am (went bathroom and it stopped) Jan 5, 10, 16 - irratic HB.  Had pickle ball for a few hours. Started when bent over.   Used to coach and play tennis. Now plays pickle ball   Cardiac catheterization 2002-LAD stent placed. Nuclear stress test 2011-small possible apical scar EF was normal After COVID in December 2020 had some mind fog and felt like down.  Has gotten much better.  Had prostatectomy with resultant central hernia. Holding Eliquis for 3 days prior. Had preop. At [facility].  Has varicose veins and wears support stockings.  Occasionally may feel some dizziness when standing up quickly in the middle of the night.  Has periph neuro in feet Past Medical History:  Diagnosis Date   Anxiety    Mild   Atrial fibrillation (HCC)    Paroxysmal   BPH (benign prostatic hyperplasia)    CAD (coronary artery disease)    Stent LAD, 2002  /  nuclear 2009, no ischemia  /    Hospital October, 2011 nuclear, no ischemia, possible slight apical scar, ejection fraction 70%   Carotid bruit    Doppler November, 2011, normal   Chest pain 02/23/2013   Chronic anticoagulation    Low CHADS , score, but patient wants to be aggressive   Colon polyps     COVID-19    + long Covid problems   Drug intolerance    Mild beta blocker intolerance   Dyslipidemia    Ejection fraction    EF 65-70%, echo, 2007 /  EF 70%, nuclear, 2011   Hyperlipidemia    IBS (irritable bowel syndrome)    Incomplete RBBB    Knee pain    left   Personal history of colonic adenomas 11/29/2006   Qualifier: Diagnosis of  By: Danny Lawless CMA, [place]     Syncope    November, 2010    Past Surgical History:  Procedure Laterality Date   BACK SURGERY  05/1990   [id number]   CARDIAC CATHETERIZATION     COLONOSCOPY  multiple   PROSTATE SURGERY     XI ROBOTIC ASSISTED SIMPLE PROSTATECTOMY  2022    Current Medications: Current Meds  Medication Sig   apixaban (ELIQUIS) 5 MG TABS tablet TAKE  (1)  TABLET TWICE A DAY.   atorvastatin (LIPITOR) 80 MG tablet TAKE 1 TABLET DAILY AT 6PM   Calcium Carb-Cholecalciferol (CALCIUM 600+D3 PO) Take 1 capsule by mouth daily.   carboxymethylcellulose (REFRESH PLUS) 0.5 % SOLN Place 1 drop into both eyes 3 (three) times daily  as needed. For dry eyes   Cholecalciferol (VITAMIN D3) 1000 UNITS CAPS Take 1,000 Units by mouth daily.   diltiazem (CARDIZEM CD) 180 MG 24 hr capsule Take 1 capsule (180 mg total) by mouth daily.   diltiazem (CARDIZEM) 60 MG tablet Take 1 tablet (60 mg total) by mouth as needed.   Glucosamine-Chondroitin 1500-1200 MG/30ML LIQD Take 1,500 mg by mouth daily.   Multiple Vitamins-Minerals (MULTIVITAMIN WITH MINERALS) tablet Take 1 tablet by mouth daily.   nitroGLYCERIN (NITROSTAT) 0.4 MG SL tablet Place 1 tablet (0.4 mg total) under the tongue every 5 (five) minutes as needed for chest pain.   Probiotic Product (ALIGN PO) Take 1 tablet by mouth daily. As needed   triamcinolone cream (KENALOG) 0.1 % Apply 1 application topically 2 (two) times daily.     Allergies:   Levofloxacin, Lopid [gemfibrozil], and Sulfonamide derivatives   Social History   Socioeconomic History   Marital status: Married    Spouse name:  Jordan [name]    Number of children: 3   Years of education: bachelors   Highest education level: Not on file  Occupational History   Occupation: Development worker, international aid    Comment: Makes dental appliances  Tobacco Use   Smoking status: Never   Smokeless tobacco: Never  Vaping Use   Vaping Use: Never used  Substance and Sexual Activity   Alcohol use: No   Drug use: No   Sexual activity: Yes  Other Topics Concern   Not on file  Social History Narrative   Married, he has children and grandchildren.   [place], raises Sales promotion account executive, retired Neurosurgeon    Social Determinants of Radio broadcast assistant Strain: Low Risk    Difficulty of Paying Living Expenses: Not hard at all  Food Insecurity: No Food Insecurity   Worried About Charity fundraiser in the Last Year: Never true   Arboriculturist in the Last Year: Never true  Transportation Needs: No Transportation Needs   Lack of Transportation (Medical): No   Lack of Transportation (Non-Medical): No  Physical Activity: Sufficiently Active   Days of Exercise per Week: 7 days   Minutes of Exercise per Session: 60 min  Stress: No Stress Concern Present   Feeling of Stress : Not at all  Social Connections: Socially Integrated   Frequency of Communication with Friends and Family: More than three times a week   Frequency of Social Gatherings with Friends and Family: More than three times a week   Attends Religious Services: More than 4 times per year   Active Member of [employer] or Organizations: Yes   Attends Music therapist: More than 4 times per year   Marital Status: Married     Family History: The patient's family history includes COPD in his sister; Cancer in his daughter and sister; Diabetes in his mother; Heart attack in his brother and mother; Heart disease in his brother; Macular degeneration in his sister; Obesity in his sister; Paranoid behavior in his sister; Stroke in his maternal grandfather and paternal  grandfather. There is no history of Colon cancer.  ROS:   Please see the history of present illness.     All other systems reviewed and are negative.  EKGs/Labs/Other Studies Reviewed:    The following studies were reviewed today: Echocardiogram 2014-EF 65% with mildly dilated right ventricle and right atrium.  EKG:  EKG is  ordered today.  The ekg ordered today demonstrates SB 58 with sinus  arrhythmia Prior EKG showed sinus rhythm 63 with no other changes Recent Labs: 10/19/2020: ALT 24; Hemoglobin 14.7; Platelets 217; TSH 2.900 01/19/2021: BUN 27; Creatinine, Ser 1.17; Magnesium 2.0; Potassium 4.8; Sodium 140  Recent Lipid Panel    Component Value Date/Time   CHOL 116 10/19/2020 0859   CHOL 125 03/06/2013 0821   TRIG 86 10/19/2020 0859   TRIG 95 05/28/2017 1247   TRIG 73 03/06/2013 0821   HDL 37 (L) 10/19/2020 0859   HDL 35 (L) 05/28/2017 1247   HDL 40 03/06/2013 0821   CHOLHDL 3.1 10/19/2020 0859   CHOLHDL 2.8 02/24/2013 0900   VLDL 16 02/24/2013 0900   LDLCALC 62 10/19/2020 0859   LDLCALC 68 11/18/2013 0804   LDLCALC 70 03/06/2013 0821     Risk Assessment/Calculations:    CHA2DS2-VASc Score = 3   This indicates a 3.2% annual risk of stroke. The patient's score is based upon: CHF History: 0 HTN History: 1 Diabetes History: 0 Stroke History: 0 Vascular Disease History: 1 Age Score: 1 Gender Score: 0              Physical Exam:    VS:  BP 112/70 (BP Location: Left Arm, Patient Position: Sitting, Cuff Size: Normal)    Pulse (!) 58    Ht 6\' 2"  (1.88 m)    Wt 213 lb (96.6 kg)    SpO2 98%    BMI 27.35 kg/m     Wt Readings from Last 3 Encounters:  07/12/21 213 lb (96.6 kg)  06/01/21 217 lb (98.4 kg)  04/20/21 214 lb (97.1 kg)     GEN:  Well nourished, well developed in no acute distress HEENT: Normal NECK: No JVD; No carotid bruits LYMPHATICS: No lymphadenopathy CARDIAC: RRR, no murmurs, no rubs, gallops RESPIRATORY:  Clear to auscultation without  rales, wheezing or rhonchi  ABDOMEN: Soft, non-tender, non-distended MUSCULOSKELETAL:  No edema; No deformity  SKIN: Warm and dry NEUROLOGIC:  Alert and oriented x 3 PSYCHIATRIC:  Normal affect   ASSESSMENT:    1. Paroxysmal atrial fibrillation (HCC)   2. Hypercoagulable state due to atrial fibrillation ([place])   3. Coronary artery disease involving native coronary artery of native heart without angina pectoris   4. OSA on CPAP   5. hyperlipidemia    PLAN:    In order of problems listed above:  Atrial fibrillation (HCC) Paroxysmal atrial fibrillation noted.  He is on diltiazem CD1 180 mg once a day.  He has diltiazem 60 mg as needed.  Has been maintaining sinus rhythm.  Previously had had approximately 3 episodes per year. Now they are more frequent and worse. Will refer EP for ablative measures.  I will check an echocardiogram since has been since 2014.  Hypercoagulable state due to atrial fibrillation (HCC) On Eliquis.  Monitoring hemoglobin and creatinine with this high risk medication.  No adverse bleeding events.  Coronary artery disease involving native coronary artery of native heart without angina pectoris LAD stent in 2002.  Overall stable without any anginal symptoms.  Prior treadmill test 2017 low risk with 9 minutes.  Nuclear stress test prior to that showed apical defect small.  Low risk.  He is not on aspirin because of Eliquis.  Atorvastatin 80 mg.  LDL goal less than 70.  OSA on CPAP Moderate sleep apnea.  CPAP.  hyperlipidemia High intensity statin atorvastatin 80 mg once a day.  LDL goal less than 70.  Previously 95.  Excellent.     Several questions  answered, total time spent 43 minutes review of patient records discussion, data.   Medication Adjustments/Labs and Tests Ordered: Current medicines are reviewed at length with the patient today.  Concerns regarding medicines are outlined above.  Orders Placed This Encounter  Procedures   Ambulatory referral to  Cardiac Electrophysiology   EKG 12-Lead   No orders of the defined types were placed in this encounter.   Patient Instructions  Medication Instructions:   Your physician recommends that you continue on your current medications as directed. Please refer to the Current Medication list given to you today.  *If you need a refill on your cardiac medications before your next appointment, please call your pharmacy*   Lab [place]    If you have labs (blood work) drawn today and your tests are completely normal, you will receive your results only by: [place] (if you have MyChart) OR A paper copy in the mail If you have any lab test that is abnormal or we need to change your treatment, we will call you to review the results.   Testing/Procedures:  NONE ORDERED  TODAY     Follow-Up: At [facility], you and your health needs are our priority.  As part of our continuing mission to provide you with exceptional heart care, we have created designated Provider Care Teams.  These Care Teams include your primary Cardiologist (physician) and Advanced Practice Providers (APPs -  Physician Assistants and Nurse Practitioners) who all work together to provide you with the care you need, when you need it.  We recommend signing up for the patient portal called "MyChart".  Sign up information is provided on this After Visit Summary.  MyChart is used to connect with patients for Virtual Visits (Telemedicine).  Patients are able to view lab/test results, encounter notes, upcoming appointments, etc.  Non-urgent messages can be sent to your provider as well.   To learn more about what you can do with MyChart, go to [email or web address].    Your next appointment:  You have been referred to TO EP DEPARTMENT FOR ABLATION AND ECHOCARDIOGRAM WITH DR Marla Roe DR LAMBERT   1 year(s)  The format for your next appointment:   In Person  Provider:   Candee Furbish, MD     Other  Instructions    Signed, Candee Furbish, MD  07/12/2021 9:52 AM    [place]

## 2021-07-12 NOTE — Assessment & Plan Note (Signed)
Moderate sleep apnea.  CPAP.

## 2021-07-12 NOTE — Assessment & Plan Note (Signed)
LAD stent in 2002.  Overall stable without any anginal symptoms.  Prior treadmill test 2017 low risk with 9 minutes.  Nuclear stress test prior to that showed apical defect small.  Low risk.  He is not on aspirin because of Eliquis.  Atorvastatin 80 mg.  LDL goal less than 70.

## 2021-07-12 NOTE — Assessment & Plan Note (Signed)
High intensity statin atorvastatin 80 mg once a day.  LDL goal less than 70.  Previously 61.  Excellent.

## 2021-07-12 NOTE — Assessment & Plan Note (Addendum)
Paroxysmal atrial fibrillation noted.  He is on diltiazem CD1 180 mg once a day.  He has diltiazem 60 mg as needed.  Has been maintaining sinus rhythm.  Previously had had approximately 3 episodes per year. Now they are more frequent and worse. Will refer EP for ablative measures.  I will check an echocardiogram since has been since 2014.

## 2021-07-15 HISTORY — PX: UMBILICAL HERNIA REPAIR: SHX196

## 2021-08-26 ENCOUNTER — Encounter: Payer: Self-pay | Admitting: *Deleted

## 2021-08-26 ENCOUNTER — Other Ambulatory Visit: Payer: Self-pay

## 2021-08-26 ENCOUNTER — Encounter: Payer: Self-pay | Admitting: Cardiology

## 2021-08-26 ENCOUNTER — Ambulatory Visit: Payer: Medicare Other | Admitting: Cardiology

## 2021-08-26 VITALS — BP 102/72 | HR 63 | Ht 74.0 in | Wt 212.2 lb

## 2021-08-26 DIAGNOSIS — I2510 Atherosclerotic heart disease of native coronary artery without angina pectoris: Secondary | ICD-10-CM | POA: Diagnosis not present

## 2021-08-26 DIAGNOSIS — I480 Paroxysmal atrial fibrillation: Secondary | ICD-10-CM | POA: Diagnosis not present

## 2021-08-26 DIAGNOSIS — Z01812 Encounter for preprocedural laboratory examination: Secondary | ICD-10-CM | POA: Diagnosis not present

## 2021-08-26 NOTE — Progress Notes (Signed)
? ?Electrophysiology Office Note ? ? ?Date:  08/26/2021  ? ?ID:  Jordan [name], DOB [birth year], MRN [id number] ? ?PCP:  [name], DO  ?Cardiologist:  Marlou Porch ?Primary Electrophysiologist:  Ysela Hettinger Meredith Leeds, MD   ? ?Chief Complaint: AF ?  ?History of Present Illness: ?Jordan [name] is a 76 y.o. male who is being seen today for the evaluation of AF at the request of Jerline Pain, MD. Presenting today for electrophysiology evaluation. ? ?He has a history significant for atrial fibrillation, coronary artery disease status post LAD stent in 2009, hyperlipidemia.  He is quite symptomatic from his atrial fibrillation.  It makes him feel quite fatigued, dizzy, short of breath.  He is usually quite active, used to coach tennis and plays pickle ball. ? ?Today, he denies symptoms of palpitations, chest pain, shortness of breath, orthopnea, PND, lower extremity edema, claudication, dizziness, presyncope, syncope, bleeding, or neurologic sequela. The patient is tolerating medications without difficulties.  He has intermittent episodes of atrial fibrillation.  He feels quite anxious when he has these episodes.  He feels short of breath, fatigued, dizzy when he has these episodes.  They are happening more often and lasting longer.  He would like to stay in normal rhythm. ? ? ?Past Medical History:  ?Diagnosis Date  ? Anxiety   ? Mild  ? Atrial fibrillation ([place])   ? Paroxysmal  ? BPH (benign prostatic hyperplasia)   ? CAD (coronary artery disease)   ? Stent LAD, 2002  /  nuclear 2009, no ischemia  /    [facility] October, 2011 nuclear, no ischemia, possible slight apical scar, ejection fraction 70%  ? Carotid bruit   ? Doppler November, 2011, normal  ? Chest pain 02/23/2013  ? Chronic anticoagulation   ? Low CHADS , score, but patient wants to be aggressive  ? Colon polyps   ? COVID-19   ? + long Covid problems  ? Drug intolerance   ? Mild beta blocker intolerance  ? Dyslipidemia   ? Ejection fraction   ? EF  65-70%, echo, 2007 /  EF 70%, nuclear, 2011  ? Hyperlipidemia   ? IBS (irritable bowel syndrome)   ? Incomplete RBBB   ? Knee pain   ? left  ? Personal history of colonic adenomas 11/29/2006  ? Qualifier: Diagnosis of  By: [place], [place]    ? Syncope   ? November, 2010  ? ?Past Surgical History:  ?Procedure Laterality Date  ? BACK SURGERY  05/1990  ? [id number]  ? CARDIAC CATHETERIZATION    ? COLONOSCOPY  multiple  ? PROSTATE SURGERY    ? XI ROBOTIC ASSISTED SIMPLE PROSTATECTOMY  2022  ? ? ? ?Current Outpatient Medications  ?Medication Sig Dispense Refill  ? apixaban (ELIQUIS) 5 MG TABS tablet TAKE  (1)  TABLET TWICE A DAY. 180 tablet 3  ? atorvastatin (LIPITOR) 80 MG tablet TAKE 1 TABLET DAILY AT 6PM 90 tablet 3  ? Calcium Carb-Cholecalciferol (CALCIUM 600+D3 PO) Take 1 capsule by mouth daily.    ? carboxymethylcellulose (REFRESH PLUS) 0.5 % SOLN Place 1 drop into both eyes 3 (three) times daily as needed. For dry eyes    ? Cholecalciferol (VITAMIN D3) 1000 UNITS CAPS Take 1,000 Units by mouth daily.    ? diltiazem (CARDIZEM CD) 180 MG 24 hr capsule Take 1 capsule (180 mg total) by mouth daily. 90 capsule 3  ? diltiazem (CARDIZEM) 60 MG tablet Take 1 tablet (60 mg total) by mouth  as needed. 90 tablet 1  ? Glucosamine-Chondroitin 1500-1200 MG/30ML LIQD Take 1,500 mg by mouth daily.    ? Multiple Vitamins-Minerals (MULTIVITAMIN WITH MINERALS) tablet Take 1 tablet by mouth daily.    ? nitroGLYCERIN (NITROSTAT) 0.4 MG SL tablet Place 1 tablet (0.4 mg total) under the tongue every 5 (five) minutes as needed for chest pain. 25 tablet 3  ? Probiotic Product (ALIGN PO) Take 1 tablet by mouth daily. As needed    ? triamcinolone cream (KENALOG) 0.1 % Apply 1 application topically 2 (two) times daily. 30 g 0  ? ?No current facility-administered medications for this visit.  ? ? ?Allergies:   Levofloxacin, Lopid [gemfibrozil], Sulfonamide derivatives, and Sulfa antibiotics  ? ?Social History:  The patient  reports that he has  never smoked. He has never used smokeless tobacco. He reports that he does not drink alcohol and does not use drugs.  ? ?Family History:  The patient's family history includes COPD in his sister; Cancer in his daughter and sister; Diabetes in his mother; Heart attack in his brother and mother; Heart disease in his brother; Macular degeneration in his sister; Obesity in his sister; Paranoid behavior in his sister; Stroke in his maternal grandfather and paternal grandfather.  ? ? ?ROS:  Please see the history of present illness.   Otherwise, review of systems is positive for none.   All other systems are reviewed and negative.  ? ? ?PHYSICAL EXAM: ?VS:  BP 102/72   Pulse 63   Ht '6\' 2"'$  (1.88 m)   Wt 212 lb 3.2 oz (96.3 kg)   SpO2 98%   BMI 27.24 kg/m?  , BMI Body mass index is 27.24 kg/m?. ?GEN: Well nourished, well developed, in no acute distress  ?HEENT: normal  ?Neck: no JVD, carotid bruits, or masses ?Cardiac: RRR; no murmurs, rubs, or gallops,no edema  ?Respiratory:  clear to auscultation bilaterally, normal work of breathing ?GI: soft, nontender, nondistended, + BS ?MS: no deformity or atrophy  ?Skin: warm and dry ?Neuro:  Strength and sensation are intact ?Psych: euthymic mood, full affect ? ?EKG:  EKG is ordered today. ?Personal review of the ekg ordered shows sinus rhythm ? ?Recent Labs: ?10/19/2020: ALT 24; Hemoglobin 14.7; Platelets 217; TSH 2.900 ?01/19/2021: BUN 27; Creatinine, Ser 1.17; Magnesium 2.0; Potassium 4.8; Sodium 140  ? ? ?Lipid Panel  ?   ?Component Value Date/Time  ? CHOL 116 10/19/2020 0859  ? CHOL 125 03/06/2013 0821  ? TRIG 86 10/19/2020 0859  ? TRIG 95 05/28/2017 1247  ? TRIG 73 03/06/2013 0821  ? HDL 37 (L) 10/19/2020 0859  ? HDL 35 (L) 05/28/2017 1247  ? HDL 40 03/06/2013 0821  ? CHOLHDL 3.1 10/19/2020 0859  ? CHOLHDL 2.8 02/24/2013 0900  ? VLDL 16 02/24/2013 0900  ? [place] 62 10/19/2020 0859  ? [place] 68 11/18/2013 0804  ? [place] 70 03/06/2013 0821  ? ? ? ?Wt Readings from Last 3  Encounters:  ?08/26/21 212 lb 3.2 oz (96.3 kg)  ?07/12/21 213 lb (96.6 kg)  ?06/01/21 217 lb (98.4 kg)  ?  ? ? ?Other studies Reviewed: ?Additional studies/ records that were reviewed today include: TTE 2014  ?Review of the above records today demonstrates:  ?- Left ventricle: The cavity size was normal. Wall thickness  ?  was normal. Systolic function was normal. The estimated  ?  ejection fraction was in the range of 60% to 65%. Wall  ?  motion was normal; there were no regional wall  motion  ?  abnormalities. Left ventricular diastolic function  ?  parameters were normal.  ?- Right ventricle: The cavity size was mildly dilated.  ?- Right atrium: The atrium was mildly dilated.  ? ? ?ASSESSMENT AND PLAN: ? ?1.  Paroxysmal atrial fibrillation: CHA2DS2-VASc of at least 2.  Currently on Eliquis 5 mg twice daily.  He is quite symptomatic when he is in atrial fibrillation.  He would prefer to avoid further medications.  Repeat echo is pending.  As he would like to avoid further medication management, we Kellie Chisolm plan for ablation. ? ?Risk, benefits, and alternatives to EP study and radiofrequency ablation for afib were also discussed in detail today. These risks include but are not limited to stroke, bleeding, vascular damage, tamponade, perforation, damage to the esophagus, lungs, and other structures, pulmonary vein stenosis, worsening renal function, and death. The patient understands these risk and wishes to proceed.  We Maycen Degregory therefore proceed with catheter ablation at the next available time.  Carto, ICE, anesthesia are requested for the procedure.  Kalik Hoare also obtain CT PV protocol prior to the procedure to exclude LAA thrombus and further evaluate atrial anatomy.  ? ?2.  Coronary artery disease: Status post LAD stent.  No current chest pain.  Continue with plan per primary cardiology. ? ?3.  Hyperlipidemia: Continue atorvastatin 80 mg per primary cardiology ? ?4.  Obstructive sleep apnea: CPAP compliance  encouraged ? ?5.  Secondary hypercoagulable state: Currently on Eliquis for atrial fibrillation as above ? ?Case discussed with primary cardiology ? ?Current medicines are reviewed at length with the patient today.   ?The pati

## 2021-08-26 NOTE — Patient Instructions (Addendum)
Medication Instructions:  ?Your physician recommends that you continue on your current medications as directed. Please refer to the Current Medication list given to you today. ? ?*If you need a refill on your cardiac medications before your next appointment, please call your pharmacy* ? ? ?Lab Work: ?Pre procedure labs 10/10/2021:  BMP & CBC ? ?If you have labs (blood work) drawn today and your tests are completely normal, you will receive your results only by: ?MyChart Message (if you have MyChart) OR ?A paper copy in the mail ?If you have any lab test that is abnormal or we need to change your treatment, we will call you to review the results. ? ? ?Testing/Procedures: ?Your physician has requested that you have an echocardiogram. Echocardiography is a painless test that uses sound waves to create images of your heart. It provides your doctor with information about the size and shape of your heart and how well your heart?s chambers and valves are working. This procedure takes approximately one hour. There are no restrictions for this procedure. ? ?Your physician has requested that you have cardiac CT within 7 days PRIOR to your ablation. Cardiac computed tomography (CT) is a painless test that uses an x-ray machine to take clear, detailed pictures of your heart. Please follow instruction sheet given to you today. ?You will get a call from our office to schedule the date for this test. ? ?Your physician has recommended that you have an ablation. Catheter ablation is a medical procedure used to treat some cardiac arrhythmias (irregular heartbeats). During catheter ablation, a long, thin, flexible tube is put into a blood vessel in your groin (upper thigh), or neck. This tube is called an ablation catheter. It is then guided to your heart through the blood vessel. Radio frequency waves destroy small areas of heart tissue where abnormal heartbeats may cause an arrhythmia to start. Please follow instruction sheet given  to you today. ? ? ?Follow-Up: ?At [facility], you and your health needs are our priority.  As part of our continuing mission to provide you with exceptional heart care, we have created designated Provider Care Teams.  These Care Teams include your primary Cardiologist (physician) and Advanced Practice Providers (APPs -  Physician Assistants and Nurse Practitioners) who all work together to provide you with the care you need, when you need it. ? ?Your next appointment:   ?1 month(s) after your ablation ? ?The format for your next appointment:   ?In Person ? ?Provider:   ?AFib clinic ? ? ?Thank you for choosing [facility]!! ? ? ?Trinidad Curet, RN ?[phone] ? ? ? ?Other Instructions                                ? ? ?Cardiac Ablation ?Cardiac ablation is a procedure to destroy (ablate) some heart tissue that is sending bad signals. These bad signals cause problems in heart rhythm. ?The heart has many areas that make these signals. If there are problems in these areas, they can make the heart beat in a way that is not normal. Destroying some tissues can help make the heart rhythm normal. ?Tell your doctor about: ?Any allergies you have. ?All medicines you are taking. These include vitamins, herbs, eye drops, creams, and over-the-counter medicines. ?Any problems you or family members have had with medicines that make you fall asleep (anesthetics). ?Any blood disorders you have. ?Any surgeries you have had. ?Any medical conditions  you have, such as kidney failure. ?Whether you are pregnant or may be pregnant. ?What are the risks? ?This is a safe procedure. But problems may occur, including: ?Infection. ?Bruising and bleeding. ?Bleeding into the chest. ?Stroke or blood clots. ?Damage to nearby areas of your body. ?Allergies to medicines or dyes. ?The need for a pacemaker if the normal system is damaged. ?Failure of the procedure to treat the problem. ?What happens before the procedure? ?Medicines ?Ask your  doctor about: ?Changing or stopping your normal medicines. This is important. ?Taking aspirin and ibuprofen. Do not take these medicines unless your doctor tells you to take them. ?Taking other medicines, vitamins, herbs, and supplements. ?General instructions ?Follow instructions from your doctor about what you cannot eat or drink. ?Plan to have someone take you home from the hospital or clinic. ?If you will be going home right after the procedure, plan to have someone with you for 24 hours. ?Ask your doctor what steps will be taken to prevent infection. ?What happens during the procedure? ? ?An IV tube will be put into one of your veins. ?You will be given a medicine to help you relax. ?The skin on your neck or groin will be numbed. ?A cut (incision) will be made in your neck or groin. A needle will be put through your cut and into a large vein. ?A tube (catheter) will be put into the needle. The tube will be moved to your heart. ?Dye may be put through the tube. This helps your doctor see your heart. ?Small devices (electrodes) on the tube will send out signals. ?A type of energy will be used to destroy some heart tissue. ?The tube will be taken out. ?Pressure will be held on your cut. This helps stop bleeding. ?A bandage will be put over your cut. ?The exact procedure may vary among doctors and hospitals. ?What happens after the procedure? ?You will be watched until you leave the hospital or clinic. This includes checking your heart rate, breathing rate, oxygen, and blood pressure. ?Your cut will be watched for bleeding. You will need to lie still for a few hours. ?Do not drive for 24 hours or as long as your doctor tells you. ?Summary ?Cardiac ablation is a procedure to destroy some heart tissue. This is done to treat heart rhythm problems. ?Tell your doctor about any medical conditions you may have. Tell him or her about all medicines you are taking to treat them. ?This is a safe procedure. But problems may  occur. These include infection, bruising, bleeding, and damage to nearby areas of your body. ?Follow what your doctor tells you about food and drink. You may also be told to change or stop some of your medicines. ?After the procedure, do not drive for 24 hours or as long as your doctor tells you. ?This information is not intended to replace advice given to you by your health care provider. Make sure you discuss any questions you have with your health care provider. ?Document Revised: 05/01/2019 Document Reviewed: 05/01/2019 ?Elsevier Patient Education ? [place]. ? ?

## 2021-08-29 DIAGNOSIS — M9902 Segmental and somatic dysfunction of thoracic region: Secondary | ICD-10-CM | POA: Diagnosis not present

## 2021-08-29 DIAGNOSIS — R42 Dizziness and giddiness: Secondary | ICD-10-CM | POA: Diagnosis not present

## 2021-08-29 DIAGNOSIS — S338XXA Sprain of other parts of lumbar spine and pelvis, initial encounter: Secondary | ICD-10-CM | POA: Diagnosis not present

## 2021-08-29 DIAGNOSIS — M9903 Segmental and somatic dysfunction of lumbar region: Secondary | ICD-10-CM | POA: Diagnosis not present

## 2021-08-29 DIAGNOSIS — M9901 Segmental and somatic dysfunction of cervical region: Secondary | ICD-10-CM | POA: Diagnosis not present

## 2021-08-29 DIAGNOSIS — S233XXA Sprain of ligaments of thoracic spine, initial encounter: Secondary | ICD-10-CM | POA: Diagnosis not present

## 2021-08-29 DIAGNOSIS — M47812 Spondylosis without myelopathy or radiculopathy, cervical region: Secondary | ICD-10-CM | POA: Diagnosis not present

## 2021-08-31 DIAGNOSIS — M47812 Spondylosis without myelopathy or radiculopathy, cervical region: Secondary | ICD-10-CM | POA: Diagnosis not present

## 2021-08-31 DIAGNOSIS — R42 Dizziness and giddiness: Secondary | ICD-10-CM | POA: Diagnosis not present

## 2021-08-31 DIAGNOSIS — M9903 Segmental and somatic dysfunction of lumbar region: Secondary | ICD-10-CM | POA: Diagnosis not present

## 2021-08-31 DIAGNOSIS — M9901 Segmental and somatic dysfunction of cervical region: Secondary | ICD-10-CM | POA: Diagnosis not present

## 2021-08-31 DIAGNOSIS — S338XXA Sprain of other parts of lumbar spine and pelvis, initial encounter: Secondary | ICD-10-CM | POA: Diagnosis not present

## 2021-08-31 DIAGNOSIS — M9902 Segmental and somatic dysfunction of thoracic region: Secondary | ICD-10-CM | POA: Diagnosis not present

## 2021-08-31 DIAGNOSIS — S233XXA Sprain of ligaments of thoracic spine, initial encounter: Secondary | ICD-10-CM | POA: Diagnosis not present

## 2021-09-12 ENCOUNTER — Ambulatory Visit (HOSPITAL_COMMUNITY): Payer: Medicare Other | Attending: Cardiology

## 2021-09-12 DIAGNOSIS — I480 Paroxysmal atrial fibrillation: Secondary | ICD-10-CM

## 2021-09-12 DIAGNOSIS — I2510 Atherosclerotic heart disease of native coronary artery without angina pectoris: Secondary | ICD-10-CM | POA: Diagnosis not present

## 2021-09-12 LAB — ECHOCARDIOGRAM COMPLETE
Area-P 1/2: 4.68 cm2
S' Lateral: 2.4 cm

## 2021-09-20 ENCOUNTER — Institutional Professional Consult (permissible substitution): Payer: Medicare Other | Admitting: Cardiology

## 2021-09-28 DIAGNOSIS — Z8719 Personal history of other diseases of the digestive system: Secondary | ICD-10-CM | POA: Diagnosis not present

## 2021-09-28 DIAGNOSIS — Z9889 Other specified postprocedural states: Secondary | ICD-10-CM | POA: Diagnosis not present

## 2021-09-28 DIAGNOSIS — Z48815 Encounter for surgical aftercare following surgery on the digestive system: Secondary | ICD-10-CM | POA: Diagnosis not present

## 2021-10-03 DIAGNOSIS — M25561 Pain in right knee: Secondary | ICD-10-CM | POA: Diagnosis not present

## 2021-10-03 DIAGNOSIS — M1711 Unilateral primary osteoarthritis, right knee: Secondary | ICD-10-CM | POA: Diagnosis not present

## 2021-10-04 DIAGNOSIS — S83281D Other tear of lateral meniscus, current injury, right knee, subsequent encounter: Secondary | ICD-10-CM | POA: Diagnosis not present

## 2021-10-04 DIAGNOSIS — S83241D Other tear of medial meniscus, current injury, right knee, subsequent encounter: Secondary | ICD-10-CM | POA: Diagnosis not present

## 2021-10-10 ENCOUNTER — Other Ambulatory Visit: Payer: Medicare Other | Admitting: *Deleted

## 2021-10-10 DIAGNOSIS — I480 Paroxysmal atrial fibrillation: Secondary | ICD-10-CM | POA: Diagnosis not present

## 2021-10-10 DIAGNOSIS — Z01812 Encounter for preprocedural laboratory examination: Secondary | ICD-10-CM

## 2021-10-10 LAB — BASIC METABOLIC PANEL
BUN/Creatinine Ratio: 27 — ABNORMAL HIGH (ref 10–24)
BUN: 28 mg/dL — ABNORMAL HIGH (ref 8–27)
CO2: 30 mmol/L — ABNORMAL HIGH (ref 20–29)
Calcium: 9.5 mg/dL (ref 8.6–10.2)
Chloride: 104 mmol/L (ref 96–106)
Creatinine, Ser: 1.02 mg/dL (ref 0.76–1.27)
Glucose: 69 mg/dL — ABNORMAL LOW (ref 70–99)
Potassium: 4.6 mmol/L (ref 3.5–5.2)
Sodium: 138 mmol/L (ref 134–144)
eGFR: 77 mL/min/{1.73_m2} (ref >59)

## 2021-10-10 LAB — CBC
Hematocrit: 44.4 % (ref 37.5–51.0)
Hemoglobin: 14.8 g/dL (ref 13.0–17.7)
MCH: 29.4 pg (ref 26.6–33.0)
MCHC: 33.3 g/dL (ref 31.5–35.7)
MCV: 88 fL (ref 79–97)
Platelets: 268 10*3/uL (ref 150–450)
RBC: 5.04 x10E6/uL (ref 4.14–5.80)
RDW: 14.7 % (ref 11.6–15.4)
WBC: 11.6 10*3/uL — ABNORMAL HIGH (ref 3.4–10.8)

## 2021-10-12 ENCOUNTER — Ambulatory Visit: Payer: Medicare Other | Admitting: Neurology

## 2021-10-12 ENCOUNTER — Encounter: Payer: Self-pay | Admitting: Neurology

## 2021-10-12 VITALS — BP 120/71 | HR 64 | Ht 74.0 in | Wt 209.5 lb

## 2021-10-12 DIAGNOSIS — G4733 Obstructive sleep apnea (adult) (pediatric): Secondary | ICD-10-CM | POA: Diagnosis not present

## 2021-10-12 DIAGNOSIS — Z7901 Long term (current) use of anticoagulants: Secondary | ICD-10-CM

## 2021-10-12 DIAGNOSIS — Z9989 Dependence on other enabling machines and devices: Secondary | ICD-10-CM

## 2021-10-12 DIAGNOSIS — I480 Paroxysmal atrial fibrillation: Secondary | ICD-10-CM

## 2021-10-12 NOTE — Addendum Note (Signed)
Addended by: Larey Seat on: 10/12/2021 11:31 AM ? ? Modules accepted: Orders ? ?

## 2021-10-12 NOTE — Progress Notes (Addendum)
? ? ? ?SLEEP MEDICINE CLINIC ?  ? ?Provider:  Larey Seat, MD  ?Primary Care Physician:  [name], DO ?[place] [id number]  ? ?  ?Referring Provider: [name], Do ?[address] [place],  [place] [address]  ?  ?  ?    ?Chief Complaint according to patient   ?Patient presents with:  ?  ? New Patient (Initial Visit)  ?   With spouse, now having dreams, not as fatigued, less fragmented sleep.  ? Here for yearly CPAP f/u. Pt reports doing well on CPAP. Pt had umbilical hernia repair on 07/15/21 and was having some issues with CPAP. Pt would like to change DME to [facility].   ?  ?  ?HISTORY OF PRESENT ILLNESS:  ?10-12-2021. ? ?Jordan [name] is a 76 y.o. year old Caucasian male patient seen here  in the [place] upon recommendation of cardiology, ?Underwent sleep study in September 2021, very mild apnea diagnosed-  Had chronic viral fatigue- Patient with mild OSA - seen here because of comorbididties- atrial fibrillation, hypercholesterolemia, CAD, and small , crowded jaw-  ?The patient is predisposed to OSA, he snored.   Post Covid, he experienced a change in sleep duration, in daytime sleepiness, and in dream pressure. He remained with excessive daytime sleepiness. He remained fatigued and with tinnitus.   ? ?Now on CPAP, had no further atrial fibrillation since using CPAP - he is using FFM and 100 % compliance is less sleepy and less fatigued.  He is getting more dream sleep- REM sleep and feels rested, he is more forgetful than before COVID but much better than after he recovered form the acute viral illness.  ?He feels rested. [place]. He is concerned about the automatic refills, and the costs for supplies he didn't feel he needed. He wants to get those form a local supplier rathe .  ?He also wonders about the  pressure- and indeed he straddled the 95% 11.8 cm water - and the upper setting at 12 cm is too tight for him. Will increase  the setting to 14 cm. ?I will ask adapt to change this setting.  ? ?He provides excellent compliance: 100% with residual AHI of 8.1/h. obstructive apnea 5.2/h- needs increase in pressure, 95% is 11.8 cm water.  ? ? ?Outcome quoted here :Summary & Diagnosis:    ?There is very mild sleep apnea noted here- the AHI is only 5.5/h,  ?with NREM AHI of only 3.7/h and REM AHI of 11.7/h. there is no  ?clinically relevant  hypoxia associated and the heart rate trend  ?is towards bradycardia.    ?  ?Recommendations: Given the very high daytime sleepiness score of [place] 18, I  ?need to first treat sleep apnea, however mild, before pursuing a  ?narcolepsy evaluation. The patient can use a dental device or  ?CPAP for at least 60 days before we re-evaluate his sleepiness  ?degree and initiate further work up.  ?Post-viral fatigue and EDS are known conditions that have  ?affected Covid patients.  ?I will initiate auto CPAP at 5-12 cm water, no EPR and mask of  ?patient's choice in the meantime.  ?   ?Interpreting Physician: Larey Seat, MD 03-08-2021  ?He finally got his CPAP delivered and now has used it 3 month through [employer]. He is still wearing a night guard- an oclusion- split with CPAP - he had for one night not  used it and only used CPAP and had more AHI. Marland Kitchen  ?The patient has been using CPAP and he has felt less fatigue and less sleepiness.  In spite of the mild degree of apnea there is a therapeutic benefit noted.  He has been 100% compliant with an average of 7 hours and 11 minutes he has very few central apneas but his obstructive AHI is 5.0.  He has been set for an expiratory pressure relief of 3 cmH2O and he uses a full facemask.  There is virtually no air leak noted the pressure is every night the same but his AHI has varied so for example from the third to 6 May he had a high AHI but the week before his AHI was 0.7/h. ?He is unsure to why this can be. No nasal congestion, uses nasal strips. still has tinnitus,  non -pulsatile.  ? ?HE CAN TELL A DIFFERENCE: especially in contrast to the post covid fatigue - he feels refreshed, motivated and his wife confirms this.   ? ? ?Original referral  from PCP. ?Chief concern according to patient :  ' I am a Covid 44 survivor 05-2019- contracted at work from a coworker". I had to retire because I couldn't recover from the fatigue. I  Worked in a Development worker, international aid ". ?I have the pleasure of seeing Jordan [name] on 01-21-2020, a right-handed Caucasian male and grandfather of 51 with a possible sleep disorder. 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 He considers himself fatigued since Covid,  ? ?Sleep relevant medical history:  Post COVID status and vaccinated- snoring, fatigue, atrial fibrillation, breathing treatments. Never dropped oxygen saturation at night-" I had many,many nightmares, hallucinations while sick with Covid".  Also tinnitus but no olfactory loss.   ?Social history:  Patient is recently retired from his Development worker, international aid and lives in a household with spouse. ?Tobacco use: never. ETOH use ; never, Caffeine intake in form of Coffee( decaffeinated- rare ) Soda( /) Tea ( /) or energy drinks.Regular exercise in form of walking .   ?Hobbies : gardening, reading ,fishing by kayak.  ? ?Sleep habits are as follows: The patient's dinner time is between 6 PM. The patient goes to bed at 11 PM and he continues to struggle with tinnitus - once asleep he will sleep for 7 hours, wakes for 3 bathroom breaks, the first time at 2-3 AM.   ?The preferred sleep position is laterally,  with the support of 1-2 pillows. ? Dreams are reportedly frequent/vivid ever since COVID.  ?6.30 AM is the usual rise time. The patient wakes up spontaneously/ at 6 AM. ?He reports not feeling refreshed or restored in AM, with symptoms such as dry mouth , morning headaches , and residual fatigue.  ?Naps are taken frequently now since retirement and since COVID- , lasting from 60 minutes and are refreshing.   ?Review of Systems: ?Out of a complete 14 system review, the patient complains of only the following symptoms, and all other reviewed systems are negative.:  ? ?varicosis- veins, discoloured lower extremities.  ? ?Fatigue, excessive daytime sleepiness , snoring, fragmented sleep,  Vivid dreams, tinnitus   ?How likely are you to doze in the  following situations: ?0 = not likely, 1 = slight chance, 2 = moderate chance, 3 = high chance ?  ?Sitting and Reading? ?Watching Television? ?Sitting inactive in a public place (theater or meeting)? ?As a passenger in a car for an hour without a break? ?Lying down in the afternoon when circumstances permit? ?Sitting and talking to someone? ?Sitting quietly after lunch without alcohol? ?In a car, while stopped for a few minutes in traffic? ?  ?Total = 6 down 18/ 24 points , now on CPAP to 6 points  ? FSS  was pre CPAP endorsed at 51/ 63 points. NOW at 20/ 63 -  ?no longer having sleepiness while driving, can go to the beach house without pause.  ? ?Social History  ? ?Socioeconomic History  ? Marital status: Married  ?  Spouse name: [name]   ? Number of children: 3  ? Years of education: bachelors  ? Highest education level: Not on file  ?Occupational History  ? 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  Comment: Makes dental appliances  ?Tobacco Use  ? Smoking status: Never Smoker  ? Smokeless tobacco: Never Used  ?Vaping Use  ? Vaping Use: Never used  ?Substance and Sexual Activity  ? Alcohol use: No  ? Drug use: No  ? Sexual activity: Yes  ?Other Topics Concern  ? Not on file   ?Social History Narrative  ? Married, he has children and 7 grandchildren.  ? Lives on [place] acres, raises chickens,  ?He works as a Neurosurgeon. ?  ? 11/23/2015  ? ?Social Determinants of Health  ? ?Financial

## 2021-10-12 NOTE — Progress Notes (Signed)
CM sent to [facility] for new order ?

## 2021-10-13 ENCOUNTER — Ambulatory Visit: Payer: Medicare Other | Attending: Orthopedic Surgery

## 2021-10-13 DIAGNOSIS — M25561 Pain in right knee: Secondary | ICD-10-CM | POA: Insufficient documentation

## 2021-10-13 DIAGNOSIS — M25661 Stiffness of right knee, not elsewhere classified: Secondary | ICD-10-CM | POA: Insufficient documentation

## 2021-10-13 NOTE — Therapy (Signed)
[place] [facility] ?[address] ?[place], [place], [address] ?Phone: [phone]   Fax:  [phone] ? ?Physical Therapy Evaluation ? ?Patient Details  ?Name: Jordan [name] ?MRN: [id number] ?Date of Birth: 1945-10-31 ?Referring Provider (PT): Swinteck, MD ? ? ?Encounter Date: 10/13/2021 ? ? PT End of Session - 10/13/21 1120   ? ? Visit Number 1   ? Number of Visits 8   ? Date for PT Re-Evaluation 11/11/21   ? PT Start Time 1121   ? PT Stop Time 1203   ? PT Time Calculation (min) 42 min   ? Activity Tolerance Patient tolerated treatment well   ? Behavior During Therapy [facility] for tasks assessed/performed   ? ?  ?  ? ?  ? ? ?Past Medical History:  ?Diagnosis Date  ? Anxiety   ? Mild  ? Atrial fibrillation ([place])   ? Paroxysmal  ? BPH (benign prostatic hyperplasia)   ? CAD (coronary artery disease)   ? Stent LAD, 2002  /  nuclear 2009, no ischemia  /    [facility] October, 2011 nuclear, no ischemia, possible slight apical scar, ejection fraction 70%  ? Carotid bruit   ? Doppler November, 2011, normal  ? Chest pain 02/23/2013  ? Chronic anticoagulation   ? Low CHADS , score, but patient wants to be aggressive  ? Colon polyps   ? COVID-19   ? + long Covid problems  ? Drug intolerance   ? Mild beta blocker intolerance  ? Dyslipidemia   ? Ejection fraction   ? EF 65-70%, echo, 2007 /  EF 70%, nuclear, 2011  ? Hyperlipidemia   ? IBS (irritable bowel syndrome)   ? Incomplete RBBB   ? Knee pain   ? left  ? Personal history of colonic adenomas 11/29/2006  ? Qualifier: Diagnosis of  By: [place], [place]    ? Syncope   ? November, 2010  ? ? ?Past Surgical History:  ?Procedure Laterality Date  ? BACK SURGERY  05/1990  ? [id number]  ? CARDIAC CATHETERIZATION    ? COLONOSCOPY  multiple  ? PROSTATE SURGERY    ? UMBILICAL HERNIA REPAIR  07/15/2021  ? [facility]  ? XI ROBOTIC ASSISTED SIMPLE PROSTATECTOMY  2022  ? ? ?There were no vitals filed for this visit. ? ? ? Subjective Assessment - 10/13/21 1120   ? ?  Subjective Patient reports that he was playing pickleball on 4/22 when his left ankle rolled and twisted his right knee. He ended up falling on his right side and hit his head. He did not notice hsi knee pain initially as his ankle was swelling. His right knee did not start swelling until he started driving home. He tried using ice on his knee until 4/24. He had his knee drained and then had MRI later that afternoon. He had a follow up on 4/25 which showed a lateral meniscus tear. He then an injection which helped his knee. He had been wearing a compression sleeve until yesterday (10/12/21). He has noticed some occasional popping in his right knee. However, he has not had his give out on him. He has not had any pain in his knee since his injection on 4/25.   ? Pertinent History previous left knee injury   ? Patient Stated Goals return to pickleball   ? Currently in Pain? Yes   ? Pain Score --   none provided  ? Pain Location Knee   ? Pain Orientation Right   ?  Pain Descriptors / Indicators Discomfort   ? Pain Type Acute pain   ? Pain Onset 1 to 4 weeks ago   ? Pain Frequency Occasional   ? Aggravating Factors  using his elliptical more than 20 minutes   ? Pain Relieving Factors ice   ? Effect of Pain on Daily Activities unable to play pickleball   ? ?  ?  ? ?  ? ? ? ? ? [facility] PT Assessment - 10/13/21 0001   ? ?  ? Assessment  ? Medical Diagnosis Tear of lateral meniscus of knee   ? Referring Provider (PT) Swinteck, MD   ? Onset Date/Surgical Date 10/01/21   ? Next MD Visit None scheduled   ? Prior Therapy Not for right knee   ?  ? Precautions  ? Precautions None   ?  ? Restrictions  ? Weight Bearing Restrictions No   ?  ? Balance Screen  ? Has the patient fallen in the past 6 months Yes   ? How many times? 1   playing pickleball on 10/01/21  ? Has the patient had a decrease in activity level because of a fear of falling?  No   ? Is the patient reluctant to leave their home because of a fear of falling?  No   ?  ? Home  Environment  ? Living Environment Private residence   ? Home Access Stairs to enter   ? Entrance Stairs-Number of Steps 2   ?  ? Prior Function  ? Level of Independence Independent   ? Vocation Retired   ? Leisure play pickleball, garden, yardwork, walking   ?  ? Cognition  ? Overall Cognitive Status Within Functional Limits for tasks assessed   ? Attention Focused   ? Focused Attention Appears intact   ? Memory Appears intact   ? Awareness Appears intact   ? Problem Solving Appears intact   ?  ? Sensation  ? Additional Comments Patient reports numbness in both feet due to neuropathy   ?  ? ROM / Strength  ? AROM / PROM / Strength Strength;AROM   ?  ? AROM  ? AROM Assessment Site Knee   ? Right/Left Knee Right   ? Right Knee Extension 2   measured in sitting  ? Right Knee Flexion 114   tightness in right hip; measured in sitting  ?  ? Strength  ? Strength Assessment Site Knee   ? Right/Left Knee Left;Right   ? Right Knee Flexion 5/5   ? Right Knee Extension 4+/5   ? Left Knee Flexion 4/5   ? Left Knee Extension 5/5   ?  ? Palpation  ? Palpation comment TTP right patellar tendon   ?  ? Transfers  ? Transfers Sit to Stand;Stand to Sit   ? Sit to Stand 6: Modified independent (Device/Increase time);With upper extremity assist;With armrests   ? Stand to Sit 6: Modified independent (Device/Increase time);With upper extremity assist;With armrests   ?  ? Ambulation/Gait  ? Assistive device None   ? Gait Pattern Step-through pattern;Within Functional Limits   ? Ambulation Surface Level;Indoor   ? ?  ?  ? ?  ? ? ? ? ? ? ? ? ? ? ? ? ? ?Objective measurements completed on examination: See above findings.  ? ? ? ? ? ? ? ? ? ? ? ? ? ? ? ? ? ? ? PT Long Term Goals - 10/13/21 1629   ? ?  ?  PT LONG TERM GOAL #1  ? Title Patient will be independent with his HEP   ? Time 4   ? Period Weeks   ? Status New   ? Target Date 11/10/21   ?  ? PT LONG TERM GOAL #2  ? Title Patient will be able to demonstrate at least 120 degrees of right  knee flexion for improved function with squatting.   ? Time 4   ? Period Weeks   ? Status New   ? Target Date 11/10/21   ?  ? PT LONG TERM GOAL #3  ? Title Patient will report being able to return to pickleball without being limited by his right knee.   ? Time 4   ? Period Weeks   ? Status New   ? Target Date 11/10/21   ?  ? PT LONG TERM GOAL #4  ? Title Patient will be able to transfer from sitting to standing without upper extremity support for improved lower extremity strength and power.   ? Time 4   ? Period Weeks   ? Status New   ? Target Date 11/10/21   ? ?  ?  ? ?  ? ? ? ? ? ? ? ? ? Plan - 10/13/21 1317   ? ? Clinical Impression Statement Patient is a 76 year old male presenting to physical therapy following a lateral meniscus tear to the right knee on 10/01/21 while playing pickleball. He presented with very low pain severity and irritability. He exhibited minimal deficits with right knee strength and AROM. However, further assessments were unable to be performed due to his significant subjective history. Recommend that he continue with skilled physical therapy to address his remaining impairments to return to his prior level of function.   ? Personal Factors and Comorbidities Comorbidity 3+;Other   ? Comorbidities a-fib, OA, chronic low back pain,   ? Examination-Activity Limitations Other   ? Examination-Participation Restrictions Community Activity;Other   ? Stability/Clinical Decision Making Stable/Uncomplicated   ? Clinical Decision Making Low   ? Rehab Potential Excellent   ? PT Frequency 2x / week   ? PT Duration 4 weeks   ? PT Treatment/Interventions Cryotherapy;Electrical Stimulation;Moist Heat;Neuromuscular re-education;Therapeutic exercise;Balance training;Therapeutic activities;Functional mobility training;Patient/family education;Manual techniques;Passive range of motion;Taping;Vasopneumatic Device   ? PT Next Visit Plan nustep or recumbent bike, LE strengthening and stability interventions, and  modalities as needed   ? Consulted and Agree with Plan of Care Patient   ? ?  ?  ? ?  ? ? ?Patient will benefit from skilled therapeutic intervention in order to improve the following deficits and impairmen

## 2021-10-17 ENCOUNTER — Ambulatory Visit: Payer: Medicare Other

## 2021-10-17 ENCOUNTER — Telehealth (HOSPITAL_COMMUNITY): Payer: Self-pay | Admitting: Emergency Medicine

## 2021-10-17 DIAGNOSIS — M25561 Pain in right knee: Secondary | ICD-10-CM

## 2021-10-17 DIAGNOSIS — M25661 Stiffness of right knee, not elsewhere classified: Secondary | ICD-10-CM | POA: Diagnosis not present

## 2021-10-17 NOTE — Telephone Encounter (Signed)
Reaching out to patient to offer assistance regarding upcoming cardiac imaging study; pt verbalizes understanding of appt date/time, parking situation and where to check in, pre-test NPO status and medications ordered, and verified current allergies; name and call back number provided for further questions should they arise ?Lukasz Rogus RN Navigator Cardiac Imaging ?[facility] ?[phone] office ?[phone] cell ? ?Denies iv issues ?Daily meds ?Arrival 100 ? ?

## 2021-10-17 NOTE — Therapy (Signed)
[place] [facility] ?[address] ?[place], [place], [address] ?Phone: [phone]   Fax:  [phone] ? ?Physical Therapy Treatment ? ?Patient Details  ?Name: Jordan [name] ?MRN: [id number] ?Date of Birth: [birth year] ?Referring Provider (PT): Swinteck, MD ? ? ?Encounter Date: 10/17/2021 ? ? PT End of Session - 10/17/21 0951   ? ? Visit Number 2   ? Number of Visits 8   ? Date for PT Re-Evaluation 11/11/21   ? PT Start Time 0093   ? PT Stop Time 1045   ? PT Time Calculation (min) 60 min   ? Activity Tolerance Patient tolerated treatment well   ? Behavior During Therapy [facility] for tasks assessed/performed   ? ?  ?  ? ?  ? ? ?Past Medical History:  ?Diagnosis Date  ? Anxiety   ? Mild  ? Atrial fibrillation ([place])   ? Paroxysmal  ? BPH (benign prostatic hyperplasia)   ? CAD (coronary artery disease)   ? Stent LAD, 2002  /  nuclear 2009, no ischemia  /    [facility] October, 2011 nuclear, no ischemia, possible slight apical scar, ejection fraction 70%  ? Carotid bruit   ? Doppler November, 2011, normal  ? Chest pain 02/23/2013  ? Chronic anticoagulation   ? Low CHADS , score, but patient wants to be aggressive  ? Colon polyps   ? COVID-19   ? + long Covid problems  ? Drug intolerance   ? Mild beta blocker intolerance  ? Dyslipidemia   ? Ejection fraction   ? EF 65-70%, echo, 2007 /  EF 70%, nuclear, 2011  ? Hyperlipidemia   ? IBS (irritable bowel syndrome)   ? Incomplete RBBB   ? Knee pain   ? left  ? Personal history of colonic adenomas 11/29/2006  ? Qualifier: Diagnosis of  By: [place], [place]    ? Syncope   ? November, 2010  ? ? ?Past Surgical History:  ?Procedure Laterality Date  ? BACK SURGERY  05/1990  ? [id number]  ? CARDIAC CATHETERIZATION    ? COLONOSCOPY  multiple  ? PROSTATE SURGERY    ? UMBILICAL HERNIA REPAIR  07/15/2021  ? [facility]  ? XI ROBOTIC ASSISTED SIMPLE PROSTATECTOMY  2022  ? ? ?There were no vitals filed for this visit. ? ? Subjective Assessment - 10/17/21 0950   ? ?  Subjective Pt arrives for today's treatment session denying any pain, but does report right knee stiffness.   ? Pertinent History previous left knee injury   ? Patient Stated Goals return to pickleball   ? Currently in Pain? No/denies   ? Pain Onset 1 to 4 weeks ago   ? ?  ?  ? ?  ? ? ? ? ? ? ? ? ? ? ? ? ? ? ? ? ? ? ? ? [facility] PT Treatment/Exercise - 10/17/21 0001   ? ?  ? Exercises  ? Exercises Knee/Hip   ?  ? Knee/Hip Exercises: Aerobic  ? Nustep Lvl 3 x 15 mins   ?  ? Knee/Hip Exercises: Machines for Strengthening  ? Cybex Knee Extension 20# x 25 reps   ? Cybex Knee Flexion 40# x 25 reps   ? Cybex Leg Press 2 plates; seat 10; 25 reps   ?  ? Knee/Hip Exercises: Standing  ? Heel Raises 20 reps   ? Heel Raises Limitations Toe raises x 20 reps   ? Knee Flexion Both;20 reps   ? Hip  Abduction Both;20 reps;Knee straight   ?  ? Modalities  ? Modalities Electrical Stimulation;Vasopneumatic   ?  ? Electrical Stimulation  ? Electrical Stimulation Location Right knee   ? Electrical Stimulation Action IFC   ? Electrical Stimulation Parameters 40% scan x 15 mins   ? Electrical Stimulation Goals Pain;Tone   ?  ? Vasopneumatic  ? Number Minutes Vasopneumatic  15 minutes   ? Vasopnuematic Location  Knee   ? Vasopneumatic Pressure Low   ? Vasopneumatic Temperature  34   ? ?  ?  ? ?  ? ? ? ? ? ? ? ? ? ? ? ? ? ? ? PT Long Term Goals - 10/13/21 1629   ? ?  ? PT LONG TERM GOAL #1  ? Title Patient will be independent with his HEP   ? Time 4   ? Period Weeks   ? Status New   ? Target Date 11/10/21   ?  ? PT LONG TERM GOAL #2  ? Title Patient will be able to demonstrate at least 120 degrees of right knee flexion for improved function with squatting.   ? Time 4   ? Period Weeks   ? Status New   ? Target Date 11/10/21   ?  ? PT LONG TERM GOAL #3  ? Title Patient will report being able to return to pickleball without being limited by his right knee.   ? Time 4   ? Period Weeks   ? Status New   ? Target Date 11/10/21   ?  ? PT LONG TERM  GOAL #4  ? Title Patient will be able to transfer from sitting to standing without upper extremity support for improved lower extremity strength and power.   ? Time 4   ? Period Weeks   ? Status New   ? Target Date 11/10/21   ? ?  ?  ? ?  ? ? ? ? ? ? ? ? Plan - 10/17/21 0952   ? ? Clinical Impression Statement Pt arrives for today's treatment session denying any pain, but endorses right knee stiffness.  Pt instructed in cybex machines to increase strength and function of right knee.  Pt requiring minimal cues for eccentric control.  Normal responses to estim and vaso noted upon removal.  Pt would benefit from recumbent bike at next visit.  Pt denied any pain at completion of today's treatment.   ? Personal Factors and Comorbidities Comorbidity 3+;Other   ? Comorbidities a-fib, OA, chronic low back pain,   ? Examination-Activity Limitations Other   ? Examination-Participation Restrictions Community Activity;Other   ? Stability/Clinical Decision Making Stable/Uncomplicated   ? Rehab Potential Excellent   ? PT Frequency 2x / week   ? PT Duration 4 weeks   ? PT Treatment/Interventions Cryotherapy;Electrical Stimulation;Moist Heat;Neuromuscular re-education;Therapeutic exercise;Balance training;Therapeutic activities;Functional mobility training;Patient/family education;Manual techniques;Passive range of motion;Taping;Vasopneumatic Device   ? PT Next Visit Plan nustep or recumbent bike, LE strengthening and stability interventions, and modalities as needed   ? Consulted and Agree with Plan of Care Patient   ? ?  ?  ? ?  ? ? ?Patient will benefit from skilled therapeutic intervention in order to improve the following deficits and impairments:  Decreased range of motion, Pain, Decreased activity tolerance, Decreased mobility, Decreased strength, Decreased balance ? ?Visit Diagnosis: ?Acute pain of right knee ? ?Stiffness of right knee, not elsewhere classified ? ? ? ? ?Problem List ?Patient Active Problem List  ? Diagnosis  Date Noted  ? Hypercoagulable state due to atrial fibrillation ([place]) 07/12/2021  ? OSA on CPAP 04/23/2020  ? Abnormal dreams 01/21/2020  ? Excessive daytime sleepiness 01/21/2020  ? 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 Elevated PSA 05/23/2019  ? Long term (current) use of anticoagulants 06/24/2015  ? Coronary artery disease involving native coronary artery of native heart without angina pectoris 06/24/2015  ? Facet degeneration of lumbar region 08/26/2012  ? Lumbar radiculopathy 08/26/2012  ? Osteoarthritis of right knee 03/26/2011  ? Traumatic tear of lateral meniscus of right knee 03/26/2011  ? Ejection fraction   ? Atrial fibrillation ([place])   ? Drug intolerance   ? Incomplete RBBB   ? Carotid bruit   ? Knee pain   ? Anxiety   ? hyperlipidemia   ? Palpitations   ? Benign prostatic hyperplasia 02/06/2009  ? Personal history of colonic adenomas 11/29/2006  ? ? ?Kathrynn Ducking, PTA ?10/17/2021, 10:52 AM ? ?[place] ?[facility] ?[address] ?[place], [place], [address] ?Phone: [phone]   Fax:  [phone] ? ?Name: Jordan [name] ?MRN: [id number] ?Date of Birth: [birth year] ? ? ? ?

## 2021-10-18 ENCOUNTER — Encounter: Payer: Self-pay | Admitting: Family Medicine

## 2021-10-18 ENCOUNTER — Ambulatory Visit (INDEPENDENT_AMBULATORY_CARE_PROVIDER_SITE_OTHER): Payer: Medicare Other | Admitting: Family Medicine

## 2021-10-18 ENCOUNTER — Ambulatory Visit (HOSPITAL_COMMUNITY)
Admission: RE | Admit: 2021-10-18 | Discharge: 2021-10-18 | Disposition: A | Discharge status: Home / Self Care | Payer: Medicare Other | Source: Ambulatory Visit | Attending: Cardiology | Admitting: Cardiology

## 2021-10-18 VITALS — BP 116/79 | HR 64 | Temp 97.4°F | Ht 74.0 in | Wt 209.2 lb

## 2021-10-18 DIAGNOSIS — I480 Paroxysmal atrial fibrillation: Secondary | ICD-10-CM | POA: Diagnosis not present

## 2021-10-18 DIAGNOSIS — Z87828 Personal history of other (healed) physical injury and trauma: Secondary | ICD-10-CM

## 2021-10-18 DIAGNOSIS — E782 Mixed hyperlipidemia: Secondary | ICD-10-CM

## 2021-10-18 HISTORY — DX: Personal history of other (healed) physical injury and trauma: Z87.828

## 2021-10-18 MED ORDER — NITROGLYCERIN 0.4 MG SL SUBL: 0.4000 mg | SUBLINGUAL_TABLET | SUBLINGUAL | 3 refills | Status: DC | PRN

## 2021-10-18 MED ORDER — ATORVASTATIN CALCIUM 80 MG PO TABS: ORAL_TABLET | ORAL | 3 refills | Status: DC

## 2021-10-18 MED ORDER — APIXABAN 5 MG PO TABS: ORAL_TABLET | ORAL | 3 refills | Status: DC

## 2021-10-18 MED ORDER — IOHEXOL 350 MG/ML SOLN
100.0000 mL | Freq: Once | INTRAVENOUS | Status: AC | PRN
  Administered 2021-10-18: 100 mL via INTRAVENOUS

## 2021-10-18 MED ORDER — DILTIAZEM HCL 60 MG PO TABS: 60.0000 mg | ORAL_TABLET | ORAL | 1 refills | Status: DC | PRN

## 2021-10-18 MED ORDER — DILTIAZEM HCL ER COATED BEADS 180 MG PO CP24: 180.0000 mg | ORAL_CAPSULE | Freq: Every day | ORAL | 3 refills | Status: DC

## 2021-10-18 NOTE — Progress Notes (Signed)
? ?Subjective: ?CC: A-fib, hyperlipidemia ?PCP: [name], DO ?Jordan [name] is a 76 y.o. male presenting to clinic today for: ? ?1.  Paroxysmal atrial fibrillation ?Patient is compliant with his Cardizem.  He takes Eliquis 5 mg twice daily.  No reports of rectal bleeding.  Occasionally he can feel himself go in and out of atrial fibrillation.  He apparently is going to see his electrophysiologist soon to talk about treatment plan.  He is strongly considering ablation. ? ?2.  Hyperlipidemia ?Patient is compliant with Lipitor 80 mg daily.  He would like to have fasting labs done today.  No reports of chest pain, shortness of breath or change in exercise tolerance. ? ?3.  Meniscal tear ?Patient is status post meniscal tear of the right knee.  He notes that this occurred while he was playing pickle ball.  No surgical intervention has been performed in fact he seems to be healing pretty well.  He should be getting back into his normal activity again.  Status post hernia repair as well and has healed fairly well from that ? ? ?ROS: Per HPI ? ?Allergies  ?Allergen Reactions  ? Levofloxacin Other (See Comments)  ?  Couldn't breathe, passed out  ? Lopid [Gemfibrozil] Other (See Comments)  ?  Loose bowels  ? Sulfonamide Derivatives Hives  ? Sulfa Antibiotics Rash  ? ?Past Medical History:  ?Diagnosis Date  ? Anxiety   ? Mild  ? Atrial fibrillation ([place])   ? Paroxysmal  ? BPH (benign prostatic hyperplasia)   ? CAD (coronary artery disease)   ? Stent LAD, 2002  /  nuclear 2009, no ischemia  /    [facility] October, 2011 nuclear, no ischemia, possible slight apical scar, ejection fraction 70%  ? Carotid bruit   ? Doppler November, 2011, normal  ? Chest pain 02/23/2013  ? Chronic anticoagulation   ? Low CHADS , score, but patient wants to be aggressive  ? Colon polyps   ? COVID-19   ? + long Covid problems  ? Drug intolerance   ? Mild beta blocker intolerance  ? Dyslipidemia   ? Ejection fraction   ? EF 65-70%,  echo, 2007 /  EF 70%, nuclear, 2011  ? Hyperlipidemia   ? IBS (irritable bowel syndrome)   ? Incomplete RBBB   ? Knee pain   ? left  ? Personal history of colonic adenomas 11/29/2006  ? Qualifier: Diagnosis of  By: [place], [place]    ? Syncope   ? November, 2010  ? ? ?Current Outpatient Medications:  ?  Calcium Carb-Cholecalciferol (CALCIUM 600+D3 PO), Take 1 capsule by mouth daily., Disp: , Rfl:  ?  carboxymethylcellulose (REFRESH PLUS) 0.5 % SOLN, Place 1 drop into both eyes 3 (three) times daily as needed. For dry eyes, Disp: , Rfl:  ?  Cholecalciferol (VITAMIN D3) 1000 UNITS CAPS, Take 1,000 Units by mouth daily., Disp: , Rfl:  ?  Glucosamine-Chondroitin 1500-1200 MG/30ML LIQD, Take 1,500 mg by mouth daily., Disp: , Rfl:  ?  Multiple Vitamins-Minerals (MULTIVITAMIN WITH MINERALS) tablet, Take 1 tablet by mouth daily., Disp: , Rfl:  ?  Probiotic Product (ALIGN PO), Take 1 tablet by mouth daily. As needed, Disp: , Rfl:  ?  triamcinolone cream (KENALOG) 0.1 %, Apply 1 application topically 2 (two) times daily., Disp: 30 g, Rfl: 0 ?  apixaban (ELIQUIS) 5 MG TABS tablet, TAKE  (1)  TABLET TWICE A DAY., Disp: 180 tablet, Rfl: 3 ?  atorvastatin (LIPITOR) 80  MG tablet, TAKE 1 TABLET DAILY AT 6PM, Disp: 90 tablet, Rfl: 3 ?  diltiazem (CARDIZEM CD) 180 MG 24 hr capsule, Take 1 capsule (180 mg total) by mouth daily., Disp: 90 capsule, Rfl: 3 ?  diltiazem (CARDIZEM) 60 MG tablet, Take 1 tablet (60 mg total) by mouth as needed., Disp: 90 tablet, Rfl: 1 ?  nitroGLYCERIN (NITROSTAT) 0.4 MG SL tablet, Place 1 tablet (0.4 mg total) under the tongue every 5 (five) minutes as needed for chest pain., Disp: 25 tablet, Rfl: 3 ?Social History  ? ?Socioeconomic History  ? Marital status: Married  ?  Spouse name: [name]   ? Number of children: 3  ? Years of education: bachelors  ? Highest education level: Not on file  ?Occupational History  ? 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  Comment: Makes dental appliances  ?Tobacco Use  ?  Smoking status: Never  ? Smokeless tobacco: Never  ?Vaping Use  ? Vaping Use: Never used  ?Substance and Sexual Activity  ? Alcohol use: No  ? Drug use: No  ? Sexual activity: Yes  ?Other Topics Concern  ? Not on file  ?Social History Narrative  ? Married, he has children and grandchildren.  ? Lives on [place], raises chickens, retired Neurosurgeon   ? ?Social Determinants of Health  ? ?Financial Resource Strain: Low Risk   ? Difficulty of Paying Living Expenses: Not hard at all  ?Food Insecurity: No Food Insecurity  ? Worried About Charity fundraiser in the Last Year: Never true  ? Ran Out of Food in the Last Year: Never true  ?Transportation Needs: No Transportation Needs  ? Lack of Transportation (Medical): No  ? Lack of Transportation (Non-Medical): No  ?Physical Activity: Sufficiently Active  ? Days of Exercise per Week: 7 days  ? Minutes of Exercise per Session: 60 min  ?Stress: No Stress Concern Present  ? Feeling of Stress : Not at all  ?Social Connections: Socially Integrated  ? Frequency of Communication with Friends and Family: More than three times a week  ? Frequency of Social Gatherings with Friends and Family: More than three times a week  ? Attends Religious Services: More than 4 times per year  ? Active Member of Clubs or Organizations: Yes  ? Attends Archivist Meetings: More than 4 times per year  ? Marital Status: Married  ?Intimate Partner Violence: Not At Risk  ? Fear of Current or Ex-Partner: No  ? Emotionally Abused: No  ? Physically Abused: No  ? Sexually Abused: No  ? ?Family History  ?Problem Relation Age of Onset  ? Heart attack Mother   ? Diabetes Mother   ? Heart attack Brother   ? Heart disease Brother   ? Stroke Maternal Grandfather   ? Stroke Paternal Grandfather   ? Macular degeneration Sister   ? Cancer Daughter   ?     colon   ? Obesity Sister   ? Paranoid behavior Sister   ? Cancer Sister   ?     lung - uterus   ? COPD Sister   ? Colon cancer Neg Hx    ? ? ?Objective: ?Office vital signs reviewed. ?BP 116/79   Pulse 64   Temp (!) 97.4 ?F (36.3 ?C)   Ht _0  (1.88 m)   Wt 209 lb 3.2 oz (94.9 kg)   SpO2 99%   BMI 26.86 kg/m?  ? ?Physical Examination:  ?General: Awake, alert, well nourished, No acute  distress ?HEENT: Sclera white.  Moist mucous membranes ?Cardio: regular rate and rhythm, S1S2 heard, no murmurs appreciated ?Pulm: clear to auscultation bilaterally, no wheezes, rhonchi or rales; normal work of breathing on room air ?MSK: Ambulating independently.  Right knee with compression brace on ? ?Assessment/ Plan: ?76 y.o. male  ? ?Mixed hyperlipidemia - Plan: atorvastatin (LIPITOR) 80 MG tablet, Lipid Panel, TSH, CMP14+EGFR ? ?Paroxysmal atrial fibrillation (HCC) - Plan: apixaban (ELIQUIS) 5 MG TABS tablet, diltiazem (CARDIZEM CD) 180 MG 24 hr capsule, diltiazem (CARDIZEM) 60 MG tablet, CMP14+EGFR, CBC, Magnesium ? ?History of meniscal tear ? ?Fasting lipid, liver function tests ordered.  Medication renewal sent ? ?Seems to be both rate and rhythm controlled today.  Continue CCB, Eliquis.  Keep follow-up with specialist.  We will look for any metabolic changes ? ?Seems to be healing well from his meniscal tear.  CBC collected as above given anticoagulation and report of hemarthrosis that was evacuated by the orthopedist ? ?Orders Placed This Encounter  ?Procedures  ? Lipid Panel  ? TSH  ? CMP14+EGFR  ? CBC  ? Magnesium  ? ?Meds ordered this encounter  ?Medications  ? apixaban (ELIQUIS) 5 MG TABS tablet  ?  Sig: TAKE  (1)  TABLET TWICE A DAY.  ?  Dispense:  180 tablet  ?  Refill:  3  ? atorvastatin (LIPITOR) 80 MG tablet  ?  Sig: TAKE 1 TABLET DAILY AT 6PM  ?  Dispense:  90 tablet  ?  Refill:  3  ? diltiazem (CARDIZEM CD) 180 MG 24 hr capsule  ?  Sig: Take 1 capsule (180 mg total) by mouth daily.  ?  Dispense:  90 capsule  ?  Refill:  3  ? diltiazem (CARDIZEM) 60 MG tablet  ?  Sig: Take 1 tablet (60 mg total) by mouth as needed.  ?  Dispense:  90 tablet   ?  Refill:  1  ? nitroGLYCERIN (NITROSTAT) 0.4 MG SL tablet  ?  Sig: Place 1 tablet (0.4 mg total) under the tongue every 5 (five) minutes as needed for chest pain.  ?  Dispense:  25 tablet  ?  Refill:  3  ? ? ? ?Jordan [name]

## 2021-10-19 LAB — CMP14+EGFR
ALT: 28 IU/L (ref 0–44)
AST: 25 IU/L (ref 0–40)
Albumin/Globulin Ratio: 1.6 (ref 1.2–2.2)
Albumin: 3.8 g/dL (ref 3.7–4.7)
Alkaline Phosphatase: 67 IU/L (ref 44–121)
BUN/Creatinine Ratio: 18 (ref 10–24)
BUN: 17 mg/dL (ref 8–27)
Bilirubin Total: 0.9 mg/dL (ref 0.0–1.2)
CO2: 25 mmol/L (ref 20–29)
Calcium: 8.9 mg/dL (ref 8.6–10.2)
Chloride: 104 mmol/L (ref 96–106)
Creatinine, Ser: 0.97 mg/dL (ref 0.76–1.27)
Globulin, Total: 2.4 g/dL (ref 1.5–4.5)
Glucose: 89 mg/dL (ref 70–99)
Potassium: 4.2 mmol/L (ref 3.5–5.2)
Sodium: 142 mmol/L (ref 134–144)
Total Protein: 6.2 g/dL (ref 6.0–8.5)
eGFR: 82 mL/min/{1.73_m2} (ref >59)

## 2021-10-19 LAB — LIPID PANEL
Chol/HDL Ratio: 3.1 ratio (ref 0.0–5.0)
Cholesterol, Total: 133 mg/dL (ref 100–199)
HDL: 43 mg/dL (ref >39)
LDL Chol Calc (NIH): 77 mg/dL (ref 0–99)
Triglycerides: 64 mg/dL (ref 0–149)
VLDL Cholesterol Cal: 13 mg/dL (ref 5–40)

## 2021-10-19 LAB — CBC
Hematocrit: 43.8 % (ref 37.5–51.0)
Hemoglobin: 14.9 g/dL (ref 13.0–17.7)
MCH: 29.7 pg (ref 26.6–33.0)
MCHC: 34 g/dL (ref 31.5–35.7)
MCV: 87 fL (ref 79–97)
Platelets: 234 10*3/uL (ref 150–450)
RBC: 5.01 x10E6/uL (ref 4.14–5.80)
RDW: 13.3 % (ref 11.6–15.4)
WBC: 7.9 10*3/uL (ref 3.4–10.8)

## 2021-10-19 LAB — MAGNESIUM: Magnesium: 2.1 mg/dL (ref 1.6–2.3)

## 2021-10-19 LAB — TSH: TSH: 2.38 u[IU]/mL (ref 0.450–4.500)

## 2021-10-20 ENCOUNTER — Ambulatory Visit: Payer: Medicare Other

## 2021-10-20 DIAGNOSIS — M25661 Stiffness of right knee, not elsewhere classified: Secondary | ICD-10-CM

## 2021-10-20 DIAGNOSIS — M25561 Pain in right knee: Secondary | ICD-10-CM | POA: Diagnosis not present

## 2021-10-20 NOTE — Therapy (Signed)
[place] ?[facility] ?[place] ?[place], [place], [address] ?Phone: [phone]   Fax:  [phone] ? ?Physical Therapy Treatment ? ?Patient Details  ?Name: Jordan [name] ?MRN: [id number] ?Date of Birth: 27-Jan-1946 ?Referring Provider (PT): Swinteck, MD ? ? ?Encounter Date: 10/20/2021 ? ? PT End of Session - 10/20/21 0950   ? ? Visit Number 3   ? Number of Visits 8   ? Date for PT Re-Evaluation 11/11/21   ? PT Start Time [id number]   ? PT Stop Time 1028   ? PT Time Calculation (min) 42 min   ? Activity Tolerance Patient tolerated treatment well   ? Behavior During Therapy [facility] for tasks assessed/performed   ? ?  ?  ? ?  ? ? ?Past Medical History:  ?Diagnosis Date  ? Anxiety   ? Mild  ? Atrial fibrillation ([place])   ? Paroxysmal  ? BPH (benign prostatic hyperplasia)   ? CAD (coronary artery disease)   ? Stent LAD, 2002  /  nuclear 2009, no ischemia  /    [facility] October, 2011 nuclear, no ischemia, possible slight apical scar, ejection fraction 70%  ? Carotid bruit   ? Doppler November, 2011, normal  ? Chest pain 02/23/2013  ? Chronic anticoagulation   ? Low CHADS , score, but patient wants to be aggressive  ? Colon polyps   ? COVID-19   ? + long Covid problems  ? Drug intolerance   ? Mild beta blocker intolerance  ? Dyslipidemia   ? Ejection fraction   ? EF 65-70%, echo, 2007 /  EF 70%, nuclear, 2011  ? Hyperlipidemia   ? IBS (irritable bowel syndrome)   ? Incomplete RBBB   ? Knee pain   ? left  ? Personal history of colonic adenomas 11/29/2006  ? Qualifier: Diagnosis of  By: [place], [place]    ? Syncope   ? November, 2010  ? ? ?Past Surgical History:  ?Procedure Laterality Date  ? BACK SURGERY  05/1990  ? [id number]  ? CARDIAC CATHETERIZATION    ? COLONOSCOPY  multiple  ? PROSTATE SURGERY    ? UMBILICAL HERNIA REPAIR  07/15/2021  ? [facility]  ? XI ROBOTIC ASSISTED SIMPLE PROSTATECTOMY  2022  ? ? ?There were no vitals filed for this visit. ? ? Subjective Assessment - 10/20/21 0949   ? ?  Subjective Patient reports that his knee feels alright today.   ? Pertinent History previous left knee injury   ? Patient Stated Goals return to pickleball   ? Currently in Pain? No/denies   ? Pain Onset 1 to 4 weeks ago   ? ?  ?  ? ?  ? ? ? ? ? ? ? ? ? ? ? ? ? ? ? ? ? ? ? ? [place] Adult PT Treatment/Exercise - 10/20/21 0001   ? ?  ? Knee/Hip Exercises: Aerobic  ? Recumbent Bike L3 x 17 minutes   ?  ? Knee/Hip Exercises: Machines for Strengthening  ? Cybex Knee Extension 20# x 30 reps   ? Cybex Knee Flexion 50# x 30 reps   ?  ? Knee/Hip Exercises: Standing  ? Heel Raises Both;2 sets;15 reps   ? Heel Raises Limitations Toe raises x 30 reps   ? Hip Abduction Both;20 reps;Knee straight   ? Abduction Limitations red t-band at ankles   ?  ? Knee/Hip Exercises: Seated  ? Sit to Sand 20 reps;without UE support   with chair tap  to elevated mat table  ? ?  ?  ? ?  ? ? ? ? ? ? ? ? ? ? ? ? ? ? ? PT Long Term Goals - 10/13/21 1629   ? ?  ? PT LONG TERM GOAL #1  ? Title Patient will be independent with his HEP   ? Time 4   ? Period Weeks   ? Status New   ? Target Date 11/10/21   ?  ? PT LONG TERM GOAL #2  ? Title Patient will be able to demonstrate at least 120 degrees of right knee flexion for improved function with squatting.   ? Time 4   ? Period Weeks   ? Status New   ? Target Date 11/10/21   ?  ? PT LONG TERM GOAL #3  ? Title Patient will report being able to return to pickleball without being limited by his right knee.   ? Time 4   ? Period Weeks   ? Status New   ? Target Date 11/10/21   ?  ? PT LONG TERM GOAL #4  ? Title Patient will be able to transfer from sitting to standing without upper extremity support for improved lower extremity strength and power.   ? Time 4   ? Period Weeks   ? Status New   ? Target Date 11/10/21   ? ?  ?  ? ?  ? ? ? ? ? ? ? ? Plan - 10/20/21 0950   ? ? Clinical Impression Statement Patient was progressed with familiar interventions for improved lower extremity strength and stability with  moderate difficulty. He required minimal cueing wiht today's interventions for slow and controlled movement to facilitate increased muscular engagement. He reported that his knee felt fatigued upon the conclusion of treatment, but it was not hurting. He continues to require skilled physical therapy to address his remaining impairments to return to his prior level of function.   ? Personal Factors and Comorbidities Comorbidity 3+;Other   ? Comorbidities a-fib, OA, chronic low back pain,   ? Examination-Activity Limitations Other   ? Examination-Participation Restrictions Community Activity;Other   ? Stability/Clinical Decision Making Stable/Uncomplicated   ? Rehab Potential Excellent   ? PT Frequency 2x / week   ? PT Duration 4 weeks   ? PT Treatment/Interventions Cryotherapy;Electrical Stimulation;Moist Heat;Neuromuscular re-education;Therapeutic exercise;Balance training;Therapeutic activities;Functional mobility training;Patient/family education;Manual techniques;Passive range of motion;Taping;Vasopneumatic Device   ? PT Next Visit Plan nustep or recumbent bike, LE strengthening and stability interventions, and modalities as needed   ? Consulted and Agree with Plan of Care Patient   ? ?  ?  ? ?  ? ? ?Patient will benefit from skilled therapeutic intervention in order to improve the following deficits and impairments:  Decreased range of motion, Pain, Decreased activity tolerance, Decreased mobility, Decreased strength, Decreased balance ? ?Visit Diagnosis: ?Acute pain of right knee ? ?Stiffness of right knee, not elsewhere classified ? ? ? ? ?Problem List ?Patient Active Problem List  ? Diagnosis Date Noted  ? History of meniscal tear 10/18/2021  ? Hypercoagulable state due to atrial fibrillation ([place]) 07/12/2021  ? OSA on CPAP 04/23/2020  ? Abnormal dreams 01/21/2020  ? Excessive daytime sleepiness 01/21/2020  ? 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 Elevated PSA 05/23/2019  ? Long term (current) use of  anticoagulants 06/24/2015  ? Coronary artery disease involving native coronary artery of native heart without angina pectoris 06/24/2015  ? Facet degeneration of lumbar region 08/26/2012  ?  Lumbar radiculopathy 08/26/2012  ? Osteoarthritis of right knee 03/26/2011  ? Traumatic tear of lateral meniscus of right knee 03/26/2011  ? Ejection fraction   ? Atrial fibrillation ([place])   ? Drug intolerance   ? Incomplete RBBB   ? Carotid bruit   ? Knee pain   ? Anxiety   ? hyperlipidemia   ? Palpitations   ? Benign prostatic hyperplasia 02/06/2009  ? Personal history of colonic adenomas 11/29/2006  ? ? ?Darlin Coco, PT ?10/20/2021, 10:30 AM ? ?[place] ?[facility] ?[place] ?[place], [place], [address] ?Phone: [phone]   Fax:  [phone] ? ?Name: LAMONTE HARTT ?MRN: [id number] ?Date of Birth: 1946/04/25 ? ? ? ?

## 2021-10-24 ENCOUNTER — Ambulatory Visit: Payer: Medicare Other

## 2021-10-24 DIAGNOSIS — M25561 Pain in right knee: Secondary | ICD-10-CM | POA: Diagnosis not present

## 2021-10-24 DIAGNOSIS — M25661 Stiffness of right knee, not elsewhere classified: Secondary | ICD-10-CM | POA: Diagnosis not present

## 2021-10-24 NOTE — Therapy (Signed)
[place] ?[facility] ?[place] ?[place], [place], [address] ?Phone: [phone]   Fax:  [phone] ? ?Physical Therapy Treatment ? ?Patient Details  ?Name: Jordan [name] ?MRN: [id number] ?Date of Birth: 16-Aug-1945 ?Referring Provider (PT): Swinteck, MD ? ? ?Encounter Date: 10/24/2021 ? ? PT End of Session - 10/24/21 0948   ? ? Visit Number 4   ? Number of Visits 8   ? Date for PT Re-Evaluation 11/11/21   ? PT Start Time 1324   ? PT Stop Time 4010   ? PT Time Calculation (min) 64 min   ? Activity Tolerance Patient tolerated treatment well   ? Behavior During Therapy [facility] for tasks assessed/performed   ? ?  ?  ? ?  ? ? ?Past Medical History:  ?Diagnosis Date  ? Anxiety   ? Mild  ? Atrial fibrillation ([place])   ? Paroxysmal  ? BPH (benign prostatic hyperplasia)   ? CAD (coronary artery disease)   ? Stent LAD, 2002  /  nuclear 2009, no ischemia  /    [facility] October, 2011 nuclear, no ischemia, possible slight apical scar, ejection fraction 70%  ? Carotid bruit   ? Doppler November, 2011, normal  ? Chest pain 02/23/2013  ? Chronic anticoagulation   ? Low CHADS , score, but patient wants to be aggressive  ? Colon polyps   ? COVID-19   ? + long Covid problems  ? Drug intolerance   ? Mild beta blocker intolerance  ? Dyslipidemia   ? Ejection fraction   ? EF 65-70%, echo, 2007 /  EF 70%, nuclear, 2011  ? Hyperlipidemia   ? IBS (irritable bowel syndrome)   ? Incomplete RBBB   ? Knee pain   ? left  ? Personal history of colonic adenomas 11/29/2006  ? Qualifier: Diagnosis of  By: Jordan [name]    ? Syncope   ? November, 2010  ? ? ?Past Surgical History:  ?Procedure Laterality Date  ? BACK SURGERY  05/1990  ? [id number]  ? CARDIAC CATHETERIZATION    ? COLONOSCOPY  multiple  ? PROSTATE SURGERY    ? UMBILICAL HERNIA REPAIR  07/15/2021  ? [facility]  ? XI ROBOTIC ASSISTED SIMPLE PROSTATECTOMY  2022  ? ? ?There were no vitals filed for this visit. ? ? Subjective Assessment - 10/24/21 0948   ? ?  Subjective Pt arrives for today's treatment session denying any pain.   ? Pertinent History previous left knee injury   ? Patient Stated Goals return to pickleball   ? Currently in Pain? No/denies   ? Pain Onset 1 to 4 weeks ago   ? ?  ?  ? ?  ? ? ? ? ? ? ? ? ? ? ? ? ? ? ? ? ? ? ? ? [name] Adult PT Treatment/Exercise - 10/24/21 0001   ? ?  ? Knee/Hip Exercises: Aerobic  ? Recumbent Bike Lvl 4 x 17 mins   ?  ? Knee/Hip Exercises: Machines for Strengthening  ? Cybex Knee Extension 20# x 35 reps   ? Cybex Knee Flexion 50# x 35 reps   ? Cybex Leg Press 3 plates; seat 10; 30 reps   ?  ? Modalities  ? Modalities Electrical Stimulation;Vasopneumatic   ?  ? Electrical Stimulation  ? Electrical Stimulation Location Right Knee   ? Electrical Stimulation Action IFC   ? Electrical Stimulation Parameters 80-150 Hz x 15 mins   ? Electrical Stimulation Goals Pain;Tone   ?  ?  Vasopneumatic  ? Number Minutes Vasopneumatic  15 minutes   ? Vasopnuematic Location  Knee   ? Vasopneumatic Pressure Low   ? Vasopneumatic Temperature  34   ? ?  ?  ? ?  ? ? ? ? ? ? ? ? ? ? ? ? ? ? ? PT Long Term Goals - 10/13/21 1629   ? ?  ? PT LONG TERM GOAL #1  ? Title Patient will be independent with his HEP   ? Time 4   ? Period Weeks   ? Status New   ? Target Date 11/10/21   ?  ? PT LONG TERM GOAL #2  ? Title Patient will be able to demonstrate at least 120 degrees of right knee flexion for improved function with squatting.   ? Time 4   ? Period Weeks   ? Status New   ? Target Date 11/10/21   ?  ? PT LONG TERM GOAL #3  ? Title Patient will report being able to return to pickleball without being limited by his right knee.   ? Time 4   ? Period Weeks   ? Status New   ? Target Date 11/10/21   ?  ? PT LONG TERM GOAL #4  ? Title Patient will be able to transfer from sitting to standing without upper extremity support for improved lower extremity strength and power.   ? Time 4   ? Period Weeks   ? Status New   ? Target Date 11/10/21   ? ?  ?  ? ?   ? ? ? ? ? ? ? ? Plan - 10/24/21 0948   ? ? Clinical Impression Statement Pt arrives for today's treatment session denying any pain.  Pt able to tolerate increased reps with cybex extension and flexion and increased resistance with leg press.  Pt able to tolerate addition of resisted walk-outs with concentration on eccentric control.  Normal responses to estim and vaso noted upon removal.  Pt denied any pain upon completion of today's treatment session.   ? Personal Factors and Comorbidities Comorbidity 3+;Other   ? Comorbidities a-fib, OA, chronic low back pain,   ? Examination-Activity Limitations Other   ? Examination-Participation Restrictions Community Activity;Other   ? Stability/Clinical Decision Making Stable/Uncomplicated   ? Rehab Potential Excellent   ? PT Frequency 2x / week   ? PT Duration 4 weeks   ? PT Treatment/Interventions Cryotherapy;Electrical Stimulation;Moist Heat;Neuromuscular re-education;Therapeutic exercise;Balance training;Therapeutic activities;Functional mobility training;Patient/family education;Manual techniques;Passive range of motion;Taping;Vasopneumatic Device   ? PT Next Visit Plan nustep or recumbent bike, LE strengthening and stability interventions, and modalities as needed   ? Consulted and Agree with Plan of Care Patient   ? ?  ?  ? ?  ? ? ?Patient will benefit from skilled therapeutic intervention in order to improve the following deficits and impairments:  Decreased range of motion, Pain, Decreased activity tolerance, Decreased mobility, Decreased strength, Decreased balance ? ?Visit Diagnosis: ?Acute pain of right knee ? ?Stiffness of right knee, not elsewhere classified ? ? ? ? ?Problem List ?Patient Active Problem List  ? Diagnosis Date Noted  ? History of meniscal tear 10/18/2021  ? Hypercoagulable state due to atrial fibrillation ([place]) 07/12/2021  ? OSA on CPAP 04/23/2020  ? Abnormal dreams 01/21/2020  ? Excessive daytime sleepiness 01/21/2020  ? Malignant neoplasm  prostate ([place]) 12/22/2019  ? Elevated PSA 05/23/2019  ? Long term (current) use of anticoagulants 06/24/2015  ? Coronary artery disease involving  native coronary artery of native heart without angina pectoris 06/24/2015  ? Facet degeneration of lumbar region 08/26/2012  ? Lumbar radiculopathy 08/26/2012  ? Osteoarthritis of right knee 03/26/2011  ? Traumatic tear of lateral meniscus of right knee 03/26/2011  ? Ejection fraction   ? Atrial fibrillation ([place])   ? Drug intolerance   ? Incomplete RBBB   ? Carotid bruit   ? Knee pain   ? Anxiety   ? hyperlipidemia   ? Palpitations   ? Benign prostatic hyperplasia 02/06/2009  ? Personal history of colonic adenomas 11/29/2006  ? ? ?Kathrynn Ducking, PTA ?10/24/2021, 10:52 AM ? ?[place] ?[facility] ?[place] ?[place], [place], [address] ?Phone: [phone]   Fax:  [phone] ? ?Name: ALANDIS BLUEMEL ?MRN: [id number] ?Date of Birth: 08/12/1945 ? ? ? ?

## 2021-10-24 NOTE — Pre-Procedure Instructions (Signed)
Instructed patient on the following items: Arrival time 0830 Nothing to eat or drink after midnight No meds AM of procedure Responsible person to drive you home and stay with you for 24 hrs  Have you missed any doses of anti-coagulant Eliquis- hasn't missed any doses   

## 2021-10-25 ENCOUNTER — Ambulatory Visit (HOSPITAL_COMMUNITY)
Admission: RE | Admit: 2021-10-25 | Discharge: 2021-10-25 | Disposition: A | Discharge status: Home / Self Care | Payer: Medicare Other | Attending: Cardiology | Admitting: Cardiology

## 2021-10-25 ENCOUNTER — Ambulatory Visit (HOSPITAL_BASED_OUTPATIENT_CLINIC_OR_DEPARTMENT_OTHER): Payer: Medicare Other | Admitting: Certified Registered Nurse Anesthetist

## 2021-10-25 ENCOUNTER — Encounter (HOSPITAL_COMMUNITY): Payer: Self-pay | Admitting: Cardiology

## 2021-10-25 ENCOUNTER — Other Ambulatory Visit: Payer: Self-pay

## 2021-10-25 ENCOUNTER — Encounter (HOSPITAL_COMMUNITY)
Admission: RE | Disposition: A | Discharge status: Home / Self Care | Payer: Medicare Other | Source: Home / Self Care | Attending: Cardiology

## 2021-10-25 ENCOUNTER — Ambulatory Visit (HOSPITAL_COMMUNITY): Payer: Medicare Other | Admitting: Certified Registered Nurse Anesthetist

## 2021-10-25 DIAGNOSIS — F419 Anxiety disorder, unspecified: Secondary | ICD-10-CM | POA: Diagnosis not present

## 2021-10-25 DIAGNOSIS — I4891 Unspecified atrial fibrillation: Secondary | ICD-10-CM

## 2021-10-25 DIAGNOSIS — I2510 Atherosclerotic heart disease of native coronary artery without angina pectoris: Secondary | ICD-10-CM | POA: Diagnosis not present

## 2021-10-25 DIAGNOSIS — Z955 Presence of coronary angioplasty implant and graft: Secondary | ICD-10-CM | POA: Insufficient documentation

## 2021-10-25 DIAGNOSIS — I10 Essential (primary) hypertension: Secondary | ICD-10-CM | POA: Diagnosis not present

## 2021-10-25 DIAGNOSIS — E785 Hyperlipidemia, unspecified: Secondary | ICD-10-CM | POA: Diagnosis not present

## 2021-10-25 DIAGNOSIS — I480 Paroxysmal atrial fibrillation: Secondary | ICD-10-CM | POA: Insufficient documentation

## 2021-10-25 HISTORY — PX: ATRIAL FIBRILLATION ABLATION: EP1191

## 2021-10-25 LAB — POCT ACTIVATED CLOTTING TIME
Activated Clotting Time: 281 seconds
Activated Clotting Time: 353 seconds

## 2021-10-25 SURGERY — ATRIAL FIBRILLATION ABLATION
Anesthesia: General

## 2021-10-25 MED ORDER — DOBUTAMINE INFUSION FOR EP/ECHO/NUC (1000 MCG/ML)
INTRAVENOUS | Status: DC | PRN
  Administered 2021-10-25: 10 ug/kg/min via INTRAVENOUS

## 2021-10-25 MED ORDER — SODIUM CHLORIDE 0.9% FLUSH: 3.0000 mL | INTRAVENOUS | Status: DC | PRN

## 2021-10-25 MED ORDER — ACETAMINOPHEN 500 MG PO TABS: 1000.0000 mg | ORAL_TABLET | Freq: Once | ORAL | Status: DC

## 2021-10-25 MED ORDER — SUGAMMADEX SODIUM 200 MG/2ML IV SOLN
INTRAVENOUS | Status: DC | PRN
  Administered 2021-10-25: 200 mg via INTRAVENOUS

## 2021-10-25 MED ORDER — FENTANYL CITRATE (PF) 100 MCG/2ML IJ SOLN
INTRAMUSCULAR | Status: AC
Start: 2021-10-25 — End: ?
  Filled 2021-10-25: qty 2

## 2021-10-25 MED ORDER — PROTAMINE SULFATE 10 MG/ML IV SOLN
INTRAVENOUS | Status: DC | PRN
  Administered 2021-10-25: 40 mg via INTRAVENOUS

## 2021-10-25 MED ORDER — ACETAMINOPHEN 500 MG PO TABS: 1000.0000 mg | ORAL_TABLET | Freq: Once | ORAL | Status: AC

## 2021-10-25 MED ORDER — ONDANSETRON HCL 4 MG/2ML IJ SOLN: 4.0000 mg | Freq: Four times a day (QID) | INTRAMUSCULAR | Status: DC | PRN

## 2021-10-25 MED ORDER — DOBUTAMINE INFUSION FOR EP/ECHO/NUC (1000 MCG/ML)
INTRAVENOUS | Status: AC
Start: 2021-10-25 — End: ?
  Filled 2021-10-25: qty 250

## 2021-10-25 MED ORDER — SODIUM CHLORIDE 0.9% FLUSH: 3.0000 mL | Freq: Two times a day (BID) | INTRAVENOUS | Status: DC

## 2021-10-25 MED ORDER — HEPARIN (PORCINE) IN NACL 1000-0.9 UT/500ML-% IV SOLN
INTRAVENOUS | Status: DC | PRN
  Administered 2021-10-25 (×5): 500 mL

## 2021-10-25 MED ORDER — SODIUM CHLORIDE 0.9 % IV SOLN: 250.0000 mL | INTRAVENOUS | Status: DC | PRN

## 2021-10-25 MED ORDER — PROPOFOL 10 MG/ML IV BOLUS
INTRAVENOUS | Status: DC | PRN
Start: 2021-10-25 — End: 2021-10-25
  Administered 2021-10-25: 80 mg via INTRAVENOUS

## 2021-10-25 MED ORDER — HEPARIN SODIUM (PORCINE) 1000 UNIT/ML IJ SOLN
INTRAMUSCULAR | Status: AC
  Filled 2021-10-25: qty 10

## 2021-10-25 MED ORDER — ROCURONIUM BROMIDE 10 MG/ML (PF) SYRINGE
PREFILLED_SYRINGE | INTRAVENOUS | Status: DC | PRN
  Administered 2021-10-25: 70 mg via INTRAVENOUS

## 2021-10-25 MED ORDER — ACETAMINOPHEN 325 MG PO TABS: 650.0000 mg | ORAL_TABLET | ORAL | Status: DC | PRN

## 2021-10-25 MED ORDER — DEXAMETHASONE SODIUM PHOSPHATE 10 MG/ML IJ SOLN
INTRAMUSCULAR | Status: DC | PRN
  Administered 2021-10-25: 10 mg via INTRAVENOUS

## 2021-10-25 MED ORDER — PHENYLEPHRINE 80 MCG/ML (10ML) SYRINGE FOR IV PUSH (FOR BLOOD PRESSURE SUPPORT)
PREFILLED_SYRINGE | INTRAVENOUS | Status: DC | PRN
  Administered 2021-10-25: 160 ug via INTRAVENOUS

## 2021-10-25 MED ORDER — ONDANSETRON HCL 4 MG/2ML IJ SOLN
INTRAMUSCULAR | Status: DC | PRN
Start: 2021-10-25 — End: 2021-10-25
  Administered 2021-10-25: 4 mg via INTRAVENOUS

## 2021-10-25 MED ORDER — SODIUM CHLORIDE 0.9 % IV SOLN: INTRAVENOUS | Status: DC

## 2021-10-25 MED ORDER — ACETAMINOPHEN 500 MG PO TABS
ORAL_TABLET | ORAL | Status: AC
  Administered 2021-10-25: 1000 mg via ORAL
  Filled 2021-10-25: qty 2

## 2021-10-25 MED ORDER — HEPARIN SODIUM (PORCINE) 1000 UNIT/ML IJ SOLN
INTRAMUSCULAR | Status: DC | PRN
  Administered 2021-10-25: 1000 [IU] via INTRAVENOUS

## 2021-10-25 MED ORDER — FENTANYL CITRATE (PF) 100 MCG/2ML IJ SOLN
INTRAMUSCULAR | Status: DC | PRN
  Administered 2021-10-25 (×2): 50 ug via INTRAVENOUS

## 2021-10-25 MED ORDER — LIDOCAINE 2% (20 MG/ML) 5 ML SYRINGE
INTRAMUSCULAR | Status: DC | PRN
  Administered 2021-10-25: 40 mg via INTRAVENOUS

## 2021-10-25 MED ORDER — COLCHICINE 0.6 MG PO TABS: 0.6000 mg | ORAL_TABLET | Freq: Every day | ORAL | 0 refills | Status: DC

## 2021-10-25 MED ORDER — HEPARIN (PORCINE) IN NACL 1000-0.9 UT/500ML-% IV SOLN
INTRAVENOUS | Status: AC
  Filled 2021-10-25: qty 2500

## 2021-10-25 MED ORDER — HEPARIN SODIUM (PORCINE) 1000 UNIT/ML IJ SOLN
INTRAMUSCULAR | Status: DC | PRN
  Administered 2021-10-25: 14000 [IU] via INTRAVENOUS
  Administered 2021-10-25: 4000 [IU] via INTRAVENOUS

## 2021-10-25 SURGICAL SUPPLY — 19 items
BAG SNAP BAND KOVER 36X36 (MISCELLANEOUS) ×1 IMPLANT
BLANKET WARM UNDERBOD FULL ACC (MISCELLANEOUS) ×2 IMPLANT
CATH S CIRCA THERM PROBE 10F (CATHETERS) ×1 IMPLANT
CATH SMTCH THERMOCOOL SF DF (CATHETERS) ×1 IMPLANT
CATH SOUNDSTAR ECO 8FR (CATHETERS) ×1 IMPLANT
CATH WEB BI DIR CSDF CRV REPRO (CATHETERS) ×1 IMPLANT
CLOSURE PERCLOSE PROSTYLE (VASCULAR PRODUCTS) ×4 IMPLANT
COVER SWIFTLINK CONNECTOR (BAG) ×2 IMPLANT
KIT VERSACROSS STEERABLE D1 (CATHETERS) ×1 IMPLANT
PACK EP LATEX FREE (CUSTOM PROCEDURE TRAY) ×2
PACK EP LF (CUSTOM PROCEDURE TRAY) ×1 IMPLANT
PAD DEFIB RADIO PHYSIO CONN (PAD) ×2 IMPLANT
PATCH CARTO3 (PAD) ×1 IMPLANT
SHEATH CARTO VIZIGO SM CVD (SHEATH) ×1 IMPLANT
SHEATH PINNACLE 7F 10CM (SHEATH) ×1 IMPLANT
SHEATH PINNACLE 8F 10CM (SHEATH) ×2 IMPLANT
SHEATH PINNACLE 9F 10CM (SHEATH) ×1 IMPLANT
SHEATH PROBE COVER 6X72 (BAG) ×1 IMPLANT
TUBING SMART ABLATE COOLFLOW (TUBING) ×1 IMPLANT

## 2021-10-25 NOTE — H&P (Signed)
? ?Electrophysiology Office Note ? ? ?Date:  10/25/2021  ? ?ID:  Jordan [name], DOB [birth year], MRN [id number] ? ?PCP:  [name], DO  ?Cardiologist:  Marlou Porch ?Primary Electrophysiologist:  Berel Najjar Meredith Leeds, MD   ? ?Chief Complaint: AF ?  ?History of Present Illness: ?Jordan [name] is a 76 y.o. male who is being seen today for the evaluation of AF at the request of No ref. provider found. Presenting today for electrophysiology evaluation. ? ?He has a history significant for atrial fibrillation, coronary artery disease status post LAD stent in 2009, hyperlipidemia.  He is quite symptomatic from his atrial fibrillation.  It makes him feel quite fatigued, dizzy, short of breath.  He is usually quite active, used to coach tennis and plays pickle ball. ? ?Today, denies symptoms of palpitations, chest pain, shortness of breath, orthopnea, PND, lower extremity edema, claudication, dizziness, presyncope, syncope, bleeding, or neurologic sequela. The patient is tolerating medications without difficulties. Plan af ablation today.  ? ? ?Past Medical History:  ?Diagnosis Date  ? Anxiety   ? Mild  ? Atrial fibrillation ([place])   ? Paroxysmal  ? BPH (benign prostatic hyperplasia)   ? CAD (coronary artery disease)   ? Stent LAD, 2002  /  nuclear 2009, no ischemia  /    [facility] October, 2011 nuclear, no ischemia, possible slight apical scar, ejection fraction 70%  ? Carotid bruit   ? Doppler November, 2011, normal  ? Chest pain 02/23/2013  ? Chronic anticoagulation   ? Low CHADS , score, but patient wants to be aggressive  ? Colon polyps   ? COVID-19   ? + long Covid problems  ? Drug intolerance   ? Mild beta blocker intolerance  ? Dyslipidemia   ? Ejection fraction   ? EF 65-70%, echo, 2007 /  EF 70%, nuclear, 2011  ? Hyperlipidemia   ? IBS (irritable bowel syndrome)   ? Incomplete RBBB   ? Knee pain   ? left  ? Personal history of colonic adenomas 11/29/2006  ? Qualifier: Diagnosis of  By: [place], [place]    ?  Syncope   ? November, 2010  ? ?Past Surgical History:  ?Procedure Laterality Date  ? BACK SURGERY  05/1990  ? [id number]  ? CARDIAC CATHETERIZATION    ? COLONOSCOPY  multiple  ? PROSTATE SURGERY    ? UMBILICAL HERNIA REPAIR  07/15/2021  ? [facility]  ? XI ROBOTIC ASSISTED SIMPLE PROSTATECTOMY  2022  ? ? ? ?Current Facility-Administered Medications  ?Medication Dose Route Frequency Provider Last Rate Last Admin  ? 0.9 %  sodium chloride infusion   Intravenous Continuous Constance Haw, MD 50 mL/hr at 10/25/21 0857 New Bag at 10/25/21 0857  ? ? ?Allergies:   Levofloxacin, Lopid [gemfibrozil], Sulfonamide derivatives, and Sulfa antibiotics  ? ?Social History:  The patient  reports that he has never smoked. He has never used smokeless tobacco. He reports that he does not drink alcohol and does not use drugs.  ? ?Family History:  The patient's family history includes COPD in his sister; Cancer in his daughter and sister; Diabetes in his mother; Heart attack in his brother and mother; Heart disease in his brother; Macular degeneration in his sister; Obesity in his sister; Paranoid behavior in his sister; Stroke in his maternal grandfather and paternal grandfather.  ? ?ROS:  Please see the history of present illness.   Otherwise, review of systems is positive for none.   All other systems are  reviewed and negative.  ? ?PHYSICAL EXAM: ?VS:  BP 130/67   Pulse 60   Temp 97.7 ?F (36.5 ?C) (Oral)   Resp 18   Ht '6\' 2"'$  (1.88 m)   Wt 92.5 kg   SpO2 93%   BMI 26.19 kg/m?  , BMI Body mass index is 26.19 kg/m?. ?GEN: Well nourished, well developed, in no acute distress  ?HEENT: normal  ?Neck: no JVD, carotid bruits, or masses ?Cardiac: RRR; no murmurs, rubs, or gallops,no edema  ?Respiratory:  clear to auscultation bilaterally, normal work of breathing ?GI: soft, nontender, nondistended, + BS ?MS: no deformity or atrophy  ?Skin: warm and dry ?Neuro:  Strength and sensation are intact ?Psych: euthymic mood, full affect ?  ? ?Recent  Labs: ?10/18/2021: ALT 28; BUN 17; Creatinine, Ser 0.97; Hemoglobin 14.9; Magnesium 2.1; Platelets 234; Potassium 4.2; Sodium 142; TSH 2.380  ? ? ?Lipid Panel  ?   ?Component Value Date/Time  ? CHOL 133 10/18/2021 0829  ? CHOL 125 03/06/2013 0821  ? TRIG 64 10/18/2021 0829  ? TRIG 95 05/28/2017 1247  ? TRIG 73 03/06/2013 0821  ? HDL 43 10/18/2021 0829  ? HDL 35 (L) 05/28/2017 1247  ? HDL 40 03/06/2013 0821  ? CHOLHDL 3.1 10/18/2021 0829  ? CHOLHDL 2.8 02/24/2013 0900  ? VLDL 16 02/24/2013 0900  ? [place] 77 10/18/2021 0829  ? [place] 68 11/18/2013 0804  ? [place] 70 03/06/2013 0821  ? ? ? ?Wt Readings from Last 3 Encounters:  ?10/25/21 92.5 kg  ?10/18/21 94.9 kg  ?10/12/21 95 kg  ?  ? ? ?Other studies Reviewed: ?Additional studies/ records that were reviewed today include: TTE 2014  ?Review of the above records today demonstrates:  ?- Left ventricle: The cavity size was normal. Wall thickness  ?  was normal. Systolic function was normal. The estimated  ?  ejection fraction was in the range of 60% to 65%. Wall  ?  motion was normal; there were no regional wall motion  ?  abnormalities. Left ventricular diastolic function  ?  parameters were normal.  ?- Right ventricle: The cavity size was mildly dilated.  ?- Right atrium: The atrium was mildly dilated.  ? ? ?ASSESSMENT AND PLAN: ? ?1.  Paroxysmal atrial fibrillation: Jordan [name] has presented today for surgery, with the diagnosis of AF.  The various methods of treatment have been discussed with the patient and family. After consideration of risks, benefits and other options for treatment, the patient has consented to  Procedure(s): ?Catheter ablation as a surgical intervention .  Risks include but not limited to complete heart block, stroke, esophageal damage, nerve damage, bleeding, vascular damage, tamponade, perforation, MI, and death. The patient's history has been reviewed, patient examined, no change in status, stable for surgery.  I have reviewed the  patient's chart and labs.  Questions were answered to the patient's satisfaction.   ? ?Allegra Lai, MD ?10/25/2021 ?9:45 AM  ? ?

## 2021-10-25 NOTE — Anesthesia Postprocedure Evaluation (Signed)
Anesthesia Post Note ? ?Patient: Jordan [name] ? ?Procedure(s) Performed: ATRIAL FIBRILLATION ABLATION ? ?  ? ?Patient location during evaluation: Phase II ?Anesthesia Type: General ?Level of consciousness: awake and alert, patient cooperative and oriented ?Pain management: pain level controlled ?Vital Signs Assessment: post-procedure vital signs reviewed and stable ?Respiratory status: spontaneous breathing, nonlabored ventilation and respiratory function stable ?Cardiovascular status: blood pressure returned to baseline and stable ?Postop Assessment: no apparent nausea or vomiting, able to ambulate and adequate PO intake ?Anesthetic complications: no ? ? ?There were no known notable events for this encounter. ? ?Last Vitals:  ?Vitals:  ? 10/25/21 1605 10/25/21 1610  ?BP:    ?Pulse: 75 76  ?Resp: 15 14  ?Temp:    ?SpO2: 97% 97%  ?  ?Last Pain:  ?Vitals:  ? 10/25/21 1339  ?TempSrc:   ?PainSc: 0-No pain  ? ? ?  ?  ?  ?  ?  ?  ? ?Charle Clear,E. Pankaj Haack ? ? ? ? ?

## 2021-10-25 NOTE — Anesthesia Procedure Notes (Signed)
Procedure Name: Intubation ?Date/Time: 10/25/2021 10:40 AM ?Performed by: Genelle Bal, CRNA ?Pre-anesthesia Checklist: Patient identified, Emergency Drugs available, Suction available and Patient being monitored ?Patient Re-evaluated:Patient Re-evaluated prior to induction ?Oxygen Delivery Method: Circle system utilized ?Preoxygenation: Pre-oxygenation with 100% oxygen ?Induction Type: IV induction ?Ventilation: Mask ventilation without difficulty ?Laryngoscope Size: Sabra Heck and 2 ?Grade View: Grade I ?Tube type: Oral ?Tube size: 7.5 mm ?Number of attempts: 1 ?Airway Equipment and Method: Stylet ?Placement Confirmation: ETT inserted through vocal cords under direct vision, positive ETCO2 and breath sounds checked- equal and bilateral ?Secured at: 22 cm ?Tube secured with: Tape ?Dental Injury: Teeth and Oropharynx as per pre-operative assessment  ? ? ? ? ?

## 2021-10-25 NOTE — Discharge Instructions (Signed)

## 2021-10-25 NOTE — Anesthesia Preprocedure Evaluation (Signed)
Anesthesia Evaluation  ?Patient identified by MRN, date of birth, ID band ?Patient awake ? ? ? ?Reviewed: ?Allergy & Precautions, NPO status , Patient's Chart, lab work & pertinent test results ? ?History of Anesthesia Complications ?Negative for: history of anesthetic complications ? ?Airway ?Mallampati: II ? ?TM Distance: >3 FB ?Neck ROM: Full ? ? ? Dental ? ?(+) Implants, Caps, Dental Advisory Given, Poor Dentition ?  ?Pulmonary ?sleep apnea and Continuous Positive Airway Pressure Ventilation ,  ?  ?breath sounds clear to auscultation ? ? ? ? ? ? Cardiovascular ?hypertension, Pt. on medications ?+ CAD and + Cardiac Stents  ?+ dysrhythmias Atrial Fibrillation  ?Rhythm:Irregular Rate:Normal ? ?09/2021 ECHO: EF 55-60%, normal LVGF, normal RVF, no significant valvular abnormalities ?  ?Neuro/Psych ?Anxiety   ? GI/Hepatic ?negative GI ROS, Neg liver ROS,   ?Endo/Other  ?negative endocrine ROS ? Renal/GU ?negative Renal ROS  ? ?  ?Musculoskeletal ? ?(+) Arthritis ,  ? Abdominal ?  ?Peds ? Hematology ? ?(+) Blood dyscrasia (eliquis), ,   ?Anesthesia Other Findings ? ? Reproductive/Obstetrics ? ?  ? ? ? ? ? ? ? ? ? ? ? ? ? ?  ?  ? ? ? ? ? ? ? ? ?Anesthesia Physical ?Anesthesia Plan ? ?ASA: 3 ? ?Anesthesia Plan: General  ? ?Post-op Pain Management: Tylenol PO (pre-op)*  ? ?Induction:  ? ?PONV Risk Score and Plan: 2 and Ondansetron, Dexamethasone and Treatment may vary due to age or medical condition ? ?Airway Management Planned: Oral ETT ? ?Additional Equipment: None ? ?Intra-op Plan:  ? ?Post-operative Plan: Extubation in OR ? ?Informed Consent: I have reviewed the patients History and Physical, chart, labs and discussed the procedure including the risks, benefits and alternatives for the proposed anesthesia with the patient or authorized representative who has indicated his/her understanding and acceptance.  ? ? ? ?Dental advisory given ? ?Plan Discussed with: CRNA and  Surgeon ? ?Anesthesia Plan Comments:   ? ? ? ? ? ? ?Anesthesia Quick Evaluation ? ?

## 2021-10-25 NOTE — Progress Notes (Signed)
Pt has been made aware of normal result and verbalized understanding.  jw

## 2021-10-25 NOTE — Transfer of Care (Signed)
Immediate Anesthesia Transfer of Care Note ? ?Patient: ERMINIO NYGARD ? ?Procedure(s) Performed: ATRIAL FIBRILLATION ABLATION ? ?Patient Location: Cath Lab ? ?Anesthesia Type:General ? ?Level of Consciousness: awake, alert  and oriented ? ?Airway & Oxygen Therapy: Patient Spontanous Breathing and Patient connected to nasal cannula oxygen ? ?Post-op Assessment: Report given to RN and Post -op Vital signs reviewed and stable ? ?Post vital signs: Reviewed and stable ? ?Last Vitals:  ?Vitals Value Taken Time  ?BP 128/66 10/25/21 1241  ?Temp 36.8 ?C 10/25/21 1242  ?Pulse 73 10/25/21 1242  ?Resp 18 10/25/21 1242  ?SpO2 96 % 10/25/21 1242  ?Vitals shown include unvalidated device data. ? ?Last Pain:  ?Vitals:  ? 10/25/21 1242  ?TempSrc: Temporal  ?PainSc: Asleep  ?   ? ?  ? ?Complications: There were no known notable events for this encounter. ?

## 2021-10-26 ENCOUNTER — Encounter (HOSPITAL_COMMUNITY): Payer: Self-pay | Admitting: Cardiology

## 2021-10-31 ENCOUNTER — Ambulatory Visit: Payer: Medicare Other

## 2021-10-31 DIAGNOSIS — M25661 Stiffness of right knee, not elsewhere classified: Secondary | ICD-10-CM | POA: Diagnosis not present

## 2021-10-31 DIAGNOSIS — M25561 Pain in right knee: Secondary | ICD-10-CM

## 2021-10-31 NOTE — Therapy (Signed)
[facility] [place], [place], [address] Phone: [phone]   Fax:  [phone]  Physical Therapy Treatment  Patient Details  Name: Jordan [name] MRN: [id number] Date of Birth: August 04, 1945 Referring Provider (PT): Swinteck, MD   Encounter Date: 10/31/2021   PT End of Session - 10/31/21 0953     Visit Number 5    Number of Visits 8    Date for PT Re-Evaluation 11/11/21    PT Start Time 0945    PT Stop Time 1050    PT Time Calculation (min) 65 min    Activity Tolerance Patient tolerated treatment well    Behavior During Therapy [facility] for tasks assessed/performed             Past Medical History:  Diagnosis Date   Anxiety    Mild   Atrial fibrillation ([place])    Paroxysmal   BPH (benign prostatic hyperplasia)    CAD (coronary artery disease)    Stent LAD, 2002  /  nuclear 2009, no ischemia  /    Hospital October, 2011 nuclear, no ischemia, possible slight apical scar, ejection fraction 70%   Carotid bruit    Doppler November, 2011, normal   Chest pain 02/23/2013   Chronic anticoagulation    Low CHADS , score, but patient wants to be aggressive   Colon polyps    COVID-19    + long Covid problems   Drug intolerance    Mild beta blocker intolerance   Dyslipidemia    Ejection fraction    EF 65-70%, echo, 2007 /  EF 70%, nuclear, 2011   Hyperlipidemia    IBS (irritable bowel syndrome)    Incomplete RBBB    Knee pain    left   Personal history of colonic adenomas 11/29/2006   Qualifier: Diagnosis of  By: Danny Lawless CMA, [place]     Syncope    November, 2010    Past Surgical History:  Procedure Laterality Date   ATRIAL FIBRILLATION ABLATION N/A 10/25/2021   Procedure: ATRIAL FIBRILLATION ABLATION;  Surgeon: Constance Haw, MD;  Location: [facility];  Service: Cardiovascular;  Laterality: N/A;   BACK SURGERY  05/1990   [id number]   CARDIAC CATHETERIZATION     COLONOSCOPY  multiple   PROSTATE SURGERY      UMBILICAL HERNIA REPAIR  07/15/2021   [facility]   XI ROBOTIC ASSISTED SIMPLE PROSTATECTOMY  2022    There were no vitals filed for this visit.   Subjective Assessment - 10/31/21 0950     Subjective Pt arrives for today's treatment session denying any pain.  Pt reports that he had an heart ablation last tuesday and is very tender in his groin region due to procedure.    Pertinent History previous left knee injury    Patient Stated Goals return to pickleball    Currently in Pain? No/denies    Pain Onset 1 to 4 weeks ago                               [facility] Adult PT Treatment/Exercise - 10/31/21 0001       Knee/Hip Exercises: Aerobic   Recumbent Bike Lvl 4 x 15 mins      Knee/Hip Exercises: Standing   Forward Lunges Right;20 reps   14 in box, 3 sec hold   Lateral Step Up Right;20 reps;Hand Hold: 2;Step Height: 6"    Forward Step Up  Right;20 reps;Hand Hold: 2;Step Height: 6"    Rocker Board 5 minutes      Modalities   Modalities Psychologist, educational Location Right knee    Electrical Stimulation Action IFC    Electrical Stimulation Parameters 80-150 Hz x 15 mins    Electrical Stimulation Goals Pain;Tone      Vasopneumatic   Number Minutes Vasopneumatic  15 minutes    Vasopnuematic Location  Knee    Vasopneumatic Pressure Low    Vasopneumatic Temperature  34                          PT Long Term Goals - 10/13/21 1629       PT LONG TERM GOAL #1   Title Patient will be independent with his HEP    Time 4    Period Weeks    Status New    Target Date 11/10/21      PT LONG TERM GOAL #2   Title Patient will be able to demonstrate at least 120 degrees of right knee flexion for improved function with squatting.    Time 4    Period Weeks    Status New    Target Date 11/10/21      PT LONG TERM GOAL #3   Title Patient will report being able to return to pickleball  without being limited by his right knee.    Time 4    Period Weeks    Status New    Target Date 11/10/21      PT LONG TERM GOAL #4   Title Patient will be able to transfer from sitting to standing without upper extremity support for improved lower extremity strength and power.    Time 4    Period Weeks    Status New    Target Date 11/10/21                   Plan - 10/31/21 0953     Clinical Impression Statement Pt arrives for todyay's treatment session denying any pain.  Pt reports that he had a heart ablation last Tuesday and was told not to lift over 5 pounds for a week after the procedure.  Pt does reports groin soreness from procedure.  Session concentrated on standing activities due to pound restrictions.  Pt requiring min cues for proper technique with newly added exercises.  Normal responses to estim and vaso noted upon removal.  Pt denied any pain at completion of today's treatment.    Personal Factors and Comorbidities Comorbidity 3+;Other    Comorbidities a-fib, OA, chronic low back pain,    Examination-Activity Limitations Other    Examination-Participation Restrictions Community Activity;Other    Stability/Clinical Decision Making Stable/Uncomplicated    Rehab Potential Excellent    PT Frequency 2x / week    PT Duration 4 weeks    PT Treatment/Interventions Cryotherapy;Electrical Stimulation;Moist Heat;Neuromuscular re-education;Therapeutic exercise;Balance training;Therapeutic activities;Functional mobility training;Patient/family education;Manual techniques;Passive range of motion;Taping;Vasopneumatic Device    PT Next Visit Plan nustep or recumbent bike, LE strengthening and stability interventions, and modalities as needed    Consulted and Agree with Plan of Care Patient             Patient will benefit from skilled therapeutic intervention in order to improve the following deficits and impairments:  Decreased range of motion, Pain, Decreased activity  tolerance, Decreased mobility, Decreased strength, Decreased balance  Visit Diagnosis: Acute pain of right knee  Stiffness of right knee, not elsewhere classified     Problem List Patient Active Problem List   Diagnosis Date Noted   History of meniscal tear 10/18/2021   Hypercoagulable state due to atrial fibrillation ([place]) 07/12/2021   OSA on CPAP 04/23/2020   Abnormal dreams 01/21/2020   Excessive daytime sleepiness 01/21/2020   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[place]) 12/22/2019   Elevated PSA 05/23/2019   Long term (current) use of anticoagulants 06/24/2015   Coronary artery disease involving native coronary artery of native heart without angina pectoris 06/24/2015   Facet degeneration of lumbar region 08/26/2012   Lumbar radiculopathy 08/26/2012   Osteoarthritis of right knee 03/26/2011   Traumatic tear of lateral meniscus of right knee 03/26/2011   Ejection fraction    Atrial fibrillation ([place])    Drug intolerance    Incomplete RBBB    Carotid bruit    Knee pain    Anxiety    hyperlipidemia    Palpitations    Benign prostatic hyperplasia 02/06/2009   Personal history of colonic adenomas 11/29/2006    Kathrynn Ducking, PTA 10/31/2021, 11:16 AM  [name] [address] [place], [place], [address] Phone: [phone]   Fax:  [phone]  Name: RISHI VICARIO MRN: [id number] Date of Birth: [birth year]

## 2021-11-01 ENCOUNTER — Ambulatory Visit: Payer: Medicare Other | Admitting: Neurology

## 2021-11-02 ENCOUNTER — Ambulatory Visit: Payer: Medicare Other

## 2021-11-02 DIAGNOSIS — M25561 Pain in right knee: Secondary | ICD-10-CM

## 2021-11-02 DIAGNOSIS — M25661 Stiffness of right knee, not elsewhere classified: Secondary | ICD-10-CM

## 2021-11-02 NOTE — Therapy (Signed)
[facility] [place], [place], [address] Phone: [phone]   Fax:  [phone]  Physical Therapy Treatment  Patient Details  Name: Jordan [name] MRN: [id number] Date of Birth: 1945-07-24 Referring Provider (PT): Swinteck, MD   Encounter Date: 11/02/2021   PT End of Session - 11/02/21 1004     Visit Number 6    Number of Visits 8    Date for PT Re-Evaluation 11/11/21    PT Start Time 4259    Activity Tolerance Patient tolerated treatment well    Behavior During Therapy [facility] for tasks assessed/performed             Past Medical History:  Diagnosis Date   Anxiety    Mild   Atrial fibrillation ([place])    Paroxysmal   BPH (benign prostatic hyperplasia)    CAD (coronary artery disease)    Stent LAD, 2002  /  nuclear 2009, no ischemia  /    Hospital October, 2011 nuclear, no ischemia, possible slight apical scar, ejection fraction 70%   Carotid bruit    Doppler November, 2011, normal   Chest pain 02/23/2013   Chronic anticoagulation    Low CHADS , score, but patient wants to be aggressive   Colon polyps    COVID-19    + long Covid problems   Drug intolerance    Mild beta blocker intolerance   Dyslipidemia    Ejection fraction    EF 65-70%, echo, 2007 /  EF 70%, nuclear, 2011   Hyperlipidemia    IBS (irritable bowel syndrome)    Incomplete RBBB    Knee pain    left   Personal history of colonic adenomas 11/29/2006   Qualifier: Diagnosis of  By: Danny Lawless CMA, [place]     Syncope    November, 2010    Past Surgical History:  Procedure Laterality Date   ATRIAL FIBRILLATION ABLATION N/A 10/25/2021   Procedure: ATRIAL FIBRILLATION ABLATION;  Surgeon: Constance Haw, MD;  Location: [facility];  Service: Cardiovascular;  Laterality: N/A;   BACK SURGERY  05/1990   L4L5S1   CARDIAC CATHETERIZATION     COLONOSCOPY  multiple   PROSTATE SURGERY     UMBILICAL HERNIA REPAIR  07/15/2021   [facility]   XI  ROBOTIC ASSISTED SIMPLE PROSTATECTOMY  2022    There were no vitals filed for this visit.   Subjective Assessment - 11/02/21 1003     Subjective Pt arrives for today's treatment session denying any pain.    Pertinent History previous left knee injury    Patient Stated Goals return to pickleball    Currently in Pain? No/denies    Pain Onset 1 to 4 weeks ago                               [facility] Adult PT Treatment/Exercise - 11/02/21 0001       Knee/Hip Exercises: Aerobic   Nustep Lvl 5 x 20 mins   all bikes were full     Knee/Hip Exercises: Machines for Strengthening   Cybex Knee Extension 20# x 20 reps    Cybex Knee Flexion 40# x 20 reps    Cybex Leg Press 2 plates; 20 reps; single leg      Knee/Hip Exercises: Standing   Forward Lunges Right;20 reps   14 inch box   Lateral Step Up Right;20 reps;Hand Hold: 2;Step Height: 6"  Forward Step Up Right;20 reps;Hand Hold: 2;Step Height: 6"      Modalities   Modalities Health visitor Stimulation Location Right knee    Electrical Stimulation Action IFC    Electrical Stimulation Parameters 80-150 Hz x 15 mins    Electrical Stimulation Goals Pain;Tone      Vasopneumatic   Number Minutes Vasopneumatic  15 minutes    Vasopnuematic Location  Knee    Vasopneumatic Pressure Low    Vasopneumatic Temperature  34                          PT Long Term Goals - 11/02/21 1008       PT LONG TERM GOAL #1   Title Patient will be independent with his HEP    Time 4    Period Weeks    Status Achieved    Target Date 11/10/21      PT LONG TERM GOAL #2   Title Patient will be able to demonstrate at least 120 degrees of right knee flexion for improved function with squatting.    Baseline 11/02/21: 132 degrees of active flexion    Time 4    Period Weeks    Status On-going    Target Date 11/10/21      PT LONG TERM GOAL #3   Title Patient  will report being able to return to pickleball without being limited by his right knee.    Baseline 11/02/21: has not tried yet    Time 4    Period Weeks    Status On-going    Target Date 11/10/21      PT LONG TERM GOAL #4   Title Patient will be able to transfer from sitting to standing without upper extremity support for improved lower extremity strength and power.    Time 4    Period Weeks    Status Achieved    Target Date 11/10/21                   Plan - 11/02/21 1004     Clinical Impression Statement Pt arrives for today's treamtent session deying any pain.  Pt is making good progress towards all of his goals and plans to attempt pickleball this weekend or early next week. Pt able to tolerate performing all cybex exercises with single leg versus bilateral.  Pt requiring min cues for eccentric control.  Normal responses to estim and vaso noted upon removal.  Pt able to demonstrate 132 degrees of active knee flexion.  Pt denied any pain at completion of today's treatment session.    Personal Factors and Comorbidities Comorbidity 3+;Other    Comorbidities a-fib, OA, chronic low back pain,    Examination-Activity Limitations Other    Examination-Participation Restrictions Community Activity;Other    Stability/Clinical Decision Making Stable/Uncomplicated    Rehab Potential Excellent    PT Frequency 2x / week    PT Duration 4 weeks    PT Treatment/Interventions Cryotherapy;Electrical Stimulation;Moist Heat;Neuromuscular re-education;Therapeutic exercise;Balance training;Therapeutic activities;Functional mobility training;Patient/family education;Manual techniques;Passive range of motion;Taping;Vasopneumatic Device    PT Next Visit Plan nustep or recumbent bike, LE strengthening and stability interventions, and modalities as needed    Consulted and Agree with Plan of Care Patient             Patient will benefit from skilled therapeutic intervention in order to improve  the following deficits and impairments:  Decreased  range of motion, Pain, Decreased activity tolerance, Decreased mobility, Decreased strength, Decreased balance  Visit Diagnosis: Acute pain of right knee  Stiffness of right knee, not elsewhere classified     Problem List Patient Active Problem List   Diagnosis Date Noted   History of meniscal tear 10/18/2021   Hypercoagulable state due to atrial fibrillation ([place]) 07/12/2021   OSA on CPAP 04/23/2020   Abnormal dreams 01/21/2020   Excessive daytime sleepiness 01/21/2020   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[place]) 12/22/2019   Elevated PSA 05/23/2019   Long term (current) use of anticoagulants 06/24/2015   Coronary artery disease involving native coronary artery of native heart without angina pectoris 06/24/2015   Facet degeneration of lumbar region 08/26/2012   Lumbar radiculopathy 08/26/2012   Osteoarthritis of right knee 03/26/2011   Traumatic tear of lateral meniscus of right knee 03/26/2011   Ejection fraction    Atrial fibrillation ([place])    Drug intolerance    Incomplete RBBB    Carotid bruit    Knee pain    Anxiety    hyperlipidemia    Palpitations    Benign prostatic hyperplasia 02/06/2009   Personal history of colonic adenomas 11/29/2006    Kathrynn Ducking, PTA 11/02/2021, 11:01 AM  [facility] [address] [place], [place], [address] Phone: [phone]   Fax:  [phone]  Name: Jordan [name] MRN: [id number] Date of Birth: Dec 16, 1945

## 2021-11-14 ENCOUNTER — Ambulatory Visit: Payer: Medicare Other | Attending: Orthopedic Surgery

## 2021-11-14 DIAGNOSIS — M25661 Stiffness of right knee, not elsewhere classified: Secondary | ICD-10-CM | POA: Diagnosis not present

## 2021-11-14 DIAGNOSIS — M25561 Pain in right knee: Secondary | ICD-10-CM | POA: Diagnosis not present

## 2021-11-14 NOTE — Therapy (Signed)
[facility] [place], [place], [address] Phone: [phone]   Fax:  [phone]  Physical Therapy Treatment  Patient Details  Name: Jordan [name] MRN: [id number] Date of Birth: [birth year] Referring Provider (PT): Swinteck, MD   Encounter Date: 11/14/2021   PT End of Session - 11/14/21 0953     Visit Number 7    Number of Visits 8    Date for PT Re-Evaluation 11/11/21    PT Start Time 0945    PT Stop Time 9509    PT Time Calculation (min) 54 min    Activity Tolerance Patient tolerated treatment well    Behavior During Therapy [facility] for tasks assessed/performed             Past Medical History:  Diagnosis Date   Anxiety    Mild   Atrial fibrillation ([place])    Paroxysmal   BPH (benign prostatic hyperplasia)    CAD (coronary artery disease)    Stent LAD, 2002  /  nuclear 2009, no ischemia  /    Hospital October, 2011 nuclear, no ischemia, possible slight apical scar, ejection fraction 70%   Carotid bruit    Doppler November, 2011, normal   Chest pain 02/23/2013   Chronic anticoagulation    Low CHADS , score, but patient wants to be aggressive   Colon polyps    COVID-19    + long Covid problems   Drug intolerance    Mild beta blocker intolerance   Dyslipidemia    Ejection fraction    EF 65-70%, echo, 2007 /  EF 70%, nuclear, 2011   Hyperlipidemia    IBS (irritable bowel syndrome)    Incomplete RBBB    Knee pain    left   Personal history of colonic adenomas 11/29/2006   Qualifier: Diagnosis of  By: Danny Lawless CMA, [place]     Syncope    November, 2010    Past Surgical History:  Procedure Laterality Date   ATRIAL FIBRILLATION ABLATION N/A 10/25/2021   Procedure: ATRIAL FIBRILLATION ABLATION;  Surgeon: Constance Haw, MD;  Location: [facility];  Service: Cardiovascular;  Laterality: N/A;   BACK SURGERY  05/1990   [id number]   CARDIAC CATHETERIZATION     COLONOSCOPY  multiple   PROSTATE SURGERY      UMBILICAL HERNIA REPAIR  07/15/2021   [facility]   XI ROBOTIC ASSISTED SIMPLE PROSTATECTOMY  2022    There were no vitals filed for this visit.   Subjective Assessment - 11/14/21 0952     Subjective Pt arrives for today's treatment session denying any pain.  Pt reports knee is feeling well.    Pertinent History previous left knee injury    Patient Stated Goals return to pickleball    Currently in Pain? No/denies    Pain Onset 1 to 4 weeks ago                               [facility] Adult PT Treatment/Exercise - 11/14/21 0001       Exercises   Exercises Knee/Hip      Knee/Hip Exercises: Aerobic   Recumbent Bike Lvl 4-5 x 18 mins      Knee/Hip Exercises: Machines for Strengthening   Cybex Knee Extension 30# x 25 reps    Cybex Knee Flexion 50# x  25 reps    Cybex Leg Press 3 plates; 25 reps  Knee/Hip Exercises: Standing   Forward Step Up Right;20 reps;5 reps;Hand Hold: 1;Left   BOSU; Ball side up   Functional Squat 20 reps   BOSU; flat side     Modalities   Modalities Electrical Stimulation;Vasopneumatic      Electrical Stimulation   Electrical Stimulation Location Right knee    Electrical Stimulation Action IFC    Electrical Stimulation Parameters 80-150 Hz x 15 min    Electrical Stimulation Goals Pain;Tone      Vasopneumatic   Number Minutes Vasopneumatic  15 minutes    Vasopnuematic Location  Knee    Vasopneumatic Pressure Low    Vasopneumatic Temperature  34                          PT Long Term Goals - 11/02/21 1008       PT LONG TERM GOAL #1   Title Patient will be independent with his HEP    Time 4    Period Weeks    Status Achieved    Target Date 11/10/21      PT LONG TERM GOAL #2   Title Patient will be able to demonstrate at least 120 degrees of right knee flexion for improved function with squatting.    Baseline 11/02/21: 132 degrees of active flexion    Time 4    Period Weeks    Status On-going    Target  Date 11/10/21      PT LONG TERM GOAL #3   Title Patient will report being able to return to pickleball without being limited by his right knee.    Baseline 11/02/21: has not tried yet    Time 4    Period Weeks    Status On-going    Target Date 11/10/21      PT LONG TERM GOAL #4   Title Patient will be able to transfer from sitting to standing without upper extremity support for improved lower extremity strength and power.    Time 4    Period Weeks    Status Achieved    Target Date 11/10/21                   Plan - 11/14/21 0953     Clinical Impression Statement Pt arrives for today's treatment session denying any pain.  Pt able to tolerate increase in weight with all cybex exercises.  Pt introduced to forward step ups onto BOSU (ball side up) to challenge strength and balance with pt requiring min cues for proper technique and to avoid pulling with UE.  Pt able to tolerate functional squats on BOSU (flat side) with cues to decrease UE use.  Pt encouraged to attempt pickley ball prior to next treatment session on Wednesday.  Normal responses to estim and vaso upon removal.  Pt denied any pain upon compleiton of today's treatment session.    Personal Factors and Comorbidities Comorbidity 3+;Other    Comorbidities a-fib, OA, chronic low back pain,    Examination-Activity Limitations Other    Examination-Participation Restrictions Community Activity;Other    Stability/Clinical Decision Making Stable/Uncomplicated    Rehab Potential Excellent    PT Frequency 2x / week    PT Duration 4 weeks    PT Treatment/Interventions Cryotherapy;Electrical Stimulation;Moist Heat;Neuromuscular re-education;Therapeutic exercise;Balance training;Therapeutic activities;Functional mobility training;Patient/family education;Manual techniques;Passive range of motion;Taping;Vasopneumatic Device    PT Next Visit Plan nustep or recumbent bike, LE strengthening and stability interventions, and modalities as  needed  Consulted and Agree with Plan of Care Patient             Patient will benefit from skilled therapeutic intervention in order to improve the following deficits and impairments:  Decreased range of motion, Pain, Decreased activity tolerance, Decreased mobility, Decreased strength, Decreased balance  Visit Diagnosis: Acute pain of right knee  Stiffness of right knee, not elsewhere classified     Problem List Patient Active Problem List   Diagnosis Date Noted   History of meniscal tear 10/18/2021   Hypercoagulable state due to atrial fibrillation ([place]) 07/12/2021   OSA on CPAP 04/23/2020   Abnormal dreams 01/21/2020   Excessive daytime sleepiness 01/21/2020   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[place]) 12/22/2019   Elevated PSA 05/23/2019   Long term (current) use of anticoagulants 06/24/2015   Coronary artery disease involving native coronary artery of native heart without angina pectoris 06/24/2015   Facet degeneration of lumbar region 08/26/2012   Lumbar radiculopathy 08/26/2012   Osteoarthritis of right knee 03/26/2011   Traumatic tear of lateral meniscus of right knee 03/26/2011   Ejection fraction    Atrial fibrillation ([facility])    Drug intolerance    Incomplete RBBB    Carotid bruit    Knee pain    Anxiety    hyperlipidemia    Palpitations    Benign prostatic hyperplasia 02/06/2009   Personal history of colonic adenomas 11/29/2006   Rationale for Evaluation and Treatment Rehabilitation  Kathrynn Ducking, PTA 11/14/2021, 10:41 AM  [facility] [place], [place], [address] Phone: [phone]   Fax:  [phone]  Name: Jordan [name] MRN: [id number] Date of Birth: 1945/09/23

## 2021-11-16 ENCOUNTER — Ambulatory Visit: Payer: Medicare Other

## 2021-11-16 DIAGNOSIS — M25561 Pain in right knee: Secondary | ICD-10-CM

## 2021-11-16 DIAGNOSIS — M25661 Stiffness of right knee, not elsewhere classified: Secondary | ICD-10-CM | POA: Diagnosis not present

## 2021-11-16 NOTE — Therapy (Signed)
[facility] [place], [place], [address] Phone: [phone]   Fax:  [phone]  Physical Therapy Treatment  Patient Details  Name: Jordan [name] MRN: [id number] Date of Birth: 06/28/1945 Referring Provider (PT): Swinteck, MD   Encounter Date: 11/16/2021   PT End of Session - 11/16/21 0952     Visit Number 8    Number of Visits 8    Date for PT Re-Evaluation 11/11/21    PT Start Time 1027    PT Stop Time 2536    PT Time Calculation (min) 43 min    Activity Tolerance Patient tolerated treatment well    Behavior During Therapy [facility] for tasks assessed/performed             Past Medical History:  Diagnosis Date   Anxiety    Mild   Atrial fibrillation ([place])    Paroxysmal   BPH (benign prostatic hyperplasia)    CAD (coronary artery disease)    Stent LAD, 2002  /  nuclear 2009, no ischemia  /    Hospital October, 2011 nuclear, no ischemia, possible slight apical scar, ejection fraction 70%   Carotid bruit    Doppler November, 2011, normal   Chest pain 02/23/2013   Chronic anticoagulation    Low CHADS , score, but patient wants to be aggressive   Colon polyps    COVID-19    + long Covid problems   Drug intolerance    Mild beta blocker intolerance   Dyslipidemia    Ejection fraction    EF 65-70%, echo, 2007 /  EF 70%, nuclear, 2011   Hyperlipidemia    IBS (irritable bowel syndrome)    Incomplete RBBB    Knee pain    left   Personal history of colonic adenomas 11/29/2006   Qualifier: Diagnosis of  By: Danny Lawless CMA, [place]     Syncope    November, 2010    Past Surgical History:  Procedure Laterality Date   ATRIAL FIBRILLATION ABLATION N/A 10/25/2021   Procedure: ATRIAL FIBRILLATION ABLATION;  Surgeon: Constance Haw, MD;  Location: [facility];  Service: Cardiovascular;  Laterality: N/A;   BACK SURGERY  05/1990   [id number]   CARDIAC CATHETERIZATION     COLONOSCOPY  multiple   PROSTATE SURGERY      UMBILICAL HERNIA REPAIR  07/15/2021   [facility]   XI ROBOTIC ASSISTED SIMPLE PROSTATECTOMY  2022    There were no vitals filed for this visit.   Subjective Assessment - 11/16/21 0950     Subjective Patient reports that he was able to play pickleball for about 3 hours since his last appointment. He notes that he is sore, but his knee is not bothering him. He reported feeling comfortable being discharged at this time.    Pertinent History previous left knee injury    Patient Stated Goals return to pickleball    Currently in Pain? No/denies    Pain Onset 1 to 4 weeks ago                               [facility] Adult PT Treatment/Exercise - 11/16/21 0001       Knee/Hip Exercises: Stretches   Passive Hamstring Stretch Right;3 reps;30 seconds    Hip Flexor Stretch Both;3 reps;30 seconds      Knee/Hip Exercises: Aerobic   Recumbent Bike L5 x 15 minutes      Knee/Hip Exercises:  Machines for Strengthening   Cybex Knee Extension 40# x 30 reps    Cybex Knee Flexion 50# x 30 reps    Cybex Leg Press 4 plates; 30 reps      Knee/Hip Exercises: Standing   Step Down Both;2 sets;10 reps;Hand Hold: 2;Step Height: 6"   cueing for eccentric quad control                    PT Education - 11/16/21 1255     Education Details return to sport    Person(s) Educated Patient    Methods Explanation    Comprehension Verbalized understanding                 PT Long Term Goals - 11/16/21 1020       PT LONG TERM GOAL #1   Title Patient will be independent with his HEP    Time 4    Period Weeks    Status Achieved    Target Date 11/10/21      PT LONG TERM GOAL #2   Title Patient will be able to demonstrate at least 120 degrees of right knee flexion for improved function with squatting.    Baseline 11/02/21: 132 degrees of active flexion    Time 4    Period Weeks    Status Achieved    Target Date 11/10/21      PT LONG TERM GOAL #3   Title Patient will  report being able to return to pickleball without being limited by his right knee.    Baseline 11/02/21: has not tried yet; 6/7: 3 hours    Time 4    Period Weeks    Status Achieved    Target Date 11/10/21      PT LONG TERM GOAL #4   Title Patient will be able to transfer from sitting to standing without upper extremity support for improved lower extremity strength and power.    Time 4    Period Weeks    Status Achieved    Target Date 11/10/21                   Plan - 11/16/21 0953     Clinical Impression Statement Patient has met all of his goals for physical therapy. He was progressed with new and familiar interventions for improved knee strength with moderate difficulty. His HEP was updated to include step downs and a seated hamstring stretch. He was educated on safely returning to pickleball to reduce his chance of injury. He reported feeling comfortable with his HEP and being discharged at this time.    Personal Factors and Comorbidities Comorbidity 3+;Other    Comorbidities a-fib, OA, chronic low back pain,    Examination-Activity Limitations Other    Examination-Participation Restrictions Community Activity;Other    Stability/Clinical Decision Making Stable/Uncomplicated    Rehab Potential Excellent    PT Frequency 2x / week    PT Duration 4 weeks    PT Treatment/Interventions Cryotherapy;Electrical Stimulation;Moist Heat;Neuromuscular re-education;Therapeutic exercise;Balance training;Therapeutic activities;Functional mobility training;Patient/family education;Manual techniques;Passive range of motion;Taping;Vasopneumatic Device    PT Next Visit Plan nustep or recumbent bike, LE strengthening and stability interventions, and modalities as needed    Consulted and Agree with Plan of Care Patient             Patient will benefit from skilled therapeutic intervention in order to improve the following deficits and impairments:  Decreased range of motion, Pain,  Decreased activity tolerance, Decreased mobility, Decreased  strength, Decreased balance  Visit Diagnosis: Acute pain of right knee  Stiffness of right knee, not elsewhere classified     Problem List Patient Active Problem List   Diagnosis Date Noted   History of meniscal tear 10/18/2021   Hypercoagulable state due to atrial fibrillation ([place]) 07/12/2021   OSA on CPAP 04/23/2020   Abnormal dreams 01/21/2020   Excessive daytime sleepiness 01/21/2020   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[place]) 12/22/2019   Elevated PSA 05/23/2019   Long term (current) use of anticoagulants 06/24/2015   Coronary artery disease involving native coronary artery of native heart without angina pectoris 06/24/2015   Facet degeneration of lumbar region 08/26/2012   Lumbar radiculopathy 08/26/2012   Osteoarthritis of right knee 03/26/2011   Traumatic tear of lateral meniscus of right knee 03/26/2011   Ejection fraction    Atrial fibrillation ([place])    Drug intolerance    Incomplete RBBB    Carotid bruit    Knee pain    Anxiety    hyperlipidemia    Palpitations    Benign prostatic hyperplasia 02/06/2009   Personal history of colonic adenomas 11/29/2006   Rationale for Evaluation and Treatment Rehabilitation   Darlin Coco, PT 11/16/2021, 12:56 PM  [facility] [place], [place], [address] Phone: [phone]   Fax:  [phone]  Name: Jordan [name] MRN: [id number] Date of Birth: [birth year]  PHYSICAL THERAPY DISCHARGE SUMMARY  Visits from Start of Care: 8  Current functional level related to goals / functional outcomes: Patient was able to meet all of his goals for physical therapy and feels comfortable being discharged at this time.    Remaining deficits: None   Education / Equipment: HEP    Patient agrees to discharge. Patient goals were met. Patient is being discharged due to meeting the stated rehab goals.

## 2021-11-21 ENCOUNTER — Ambulatory Visit (INDEPENDENT_AMBULATORY_CARE_PROVIDER_SITE_OTHER): Payer: Medicare Other

## 2021-11-21 VITALS — Wt 204.0 lb

## 2021-11-21 DIAGNOSIS — Z0000 Encounter for general adult medical examination without abnormal findings: Secondary | ICD-10-CM

## 2021-11-21 NOTE — Progress Notes (Signed)
Subjective:   Jordan [name] is a 76 y.o. male who presents for Medicare Annual/Subsequent preventive examination.  Virtual Visit via Telephone Note  I connected with  Jordan [name] on 11/21/21 at  2:45 PM EDT by telephone and verified that I am speaking with the correct person using two identifiers.  Location: Patient: Home Provider: [facility] Persons participating in the virtual visit: patient/Nurse Health Advisor   I discussed the limitations, risks, security and privacy concerns of performing an evaluation and management service by telephone and the availability of in person appointments. The patient expressed understanding and agreed to proceed.  Interactive audio and video telecommunications were attempted between this nurse and patient, however failed, due to patient having technical difficulties OR patient did not have access to video capability.  We continued and completed visit with audio only.  Some vital signs may be absent or patient reported.   Jordan [name] E Jordan Rahming, LPN   Review of Systems     Cardiac Risk Factors include: advanced age (>45mn, >>60women);male gender;dyslipidemia;Other (see comment), Risk factor comments: A.FIB, OSA on CPAP, CAD     Objective:    Today's Vitals   11/21/21 1445  Weight: 204 lb (92.5 kg)   Body mass index is 26.19 kg/m.     11/21/2021    2:50 PM 10/25/2021    8:54 AM 11/18/2020    2:59 PM 11/18/2019    2:47 PM 10/24/2018    9:44 AM 03/05/2017    1:04 PM 09/22/2015   12:11 PM  Advanced Directives  Does Patient Have a Medical Advance Directive? Yes Yes Yes Yes Yes Yes Yes;No  Type of AParamedicof [place]Living will Healthcare Power of [place]Living will [place]Living will [place]Living will [place]Living will   Does patient want to make changes to medical advance directive?  No - Patient declined  No - Patient declined  No - Patient declined No - Patient declined No - Patient declined  Copy of HFarrellin Chart? No - copy requested  No - copy requested No - copy requested No - copy requested No - copy requested     Current Medications (verified) Outpatient Encounter Medications as of 11/21/2021  Medication Sig   apixaban (ELIQUIS) 5 MG TABS tablet TAKE  (1)  TABLET TWICE A DAY.   atorvastatin (LIPITOR) 80 MG tablet TAKE 1 TABLET DAILY AT 6PM   Calcium Carb-Cholecalciferol (CALCIUM 600+D3 PO) Take 1 capsule by mouth daily.   carboxymethylcellulose (REFRESH PLUS) 0.5 % SOLN Place 1 drop into both eyes 3 (three) times daily as needed. For dry eyes   Cholecalciferol (VITAMIN D3) 1000 UNITS CAPS Take 1,000 Units by mouth daily.   colchicine 0.6 MG tablet Take 1 tablet (0.6 mg total) by mouth daily.   diltiazem (CARDIZEM CD) 180 MG 24 hr capsule Take 1 capsule (180 mg total) by mouth daily.   diltiazem (CARDIZEM) 60 MG tablet Take 1 tablet (60 mg total) by mouth as needed.   GLUCOSAMINE-CHONDROITIN PO Take 1,500 mg by mouth daily.   Multiple Vitamins-Minerals (MULTIVITAMIN WITH MINERALS) tablet Take 1 tablet by mouth daily.   nitroGLYCERIN (NITROSTAT) 0.4 MG SL tablet Place 1 tablet (0.4 mg total) under the tongue every 5 (five) minutes as needed for chest pain.   Probiotic Product (ALIGN PO) Take 1 tablet by mouth daily as needed.   triamcinolone cream (KENALOG) 0.1 % Apply 1 application topically  2 (two) times daily. (Patient taking differently: Apply 1 application  topically daily as needed (tic bites).)   No facility-administered encounter medications on file as of 11/21/2021.    Allergies (verified) Levofloxacin, Lopid [gemfibrozil], Sulfonamide derivatives, and Sulfa antibiotics   History: Past Medical History:  Diagnosis Date   Anxiety    Mild   Atrial fibrillation (HCC)    Paroxysmal   BPH (benign prostatic hyperplasia)    CAD (coronary artery disease)    Stent LAD, 2002  /   nuclear 2009, no ischemia  /    Hospital October, 2011 nuclear, no ischemia, possible slight apical scar, ejection fraction 70%   Carotid bruit    Doppler November, 2011, normal   Chest pain 02/23/2013   Chronic anticoagulation    Low CHADS , score, but patient wants to be aggressive   Colon polyps    COVID-19    + long Covid problems   Drug intolerance    Mild beta blocker intolerance   Dyslipidemia    Ejection fraction    EF 65-70%, echo, 2007 /  EF 70%, nuclear, 2011   Hyperlipidemia    IBS (irritable bowel syndrome)    Incomplete RBBB    Knee pain    left   Personal history of colonic adenomas 11/29/2006   Qualifier: Diagnosis of  By: Danny Lawless CMA, [place]     Syncope    November, 2010   Past Surgical History:  Procedure Laterality Date   ATRIAL FIBRILLATION ABLATION N/A 10/25/2021   Procedure: ATRIAL FIBRILLATION ABLATION;  Surgeon: Constance Haw, MD;  Location: [facility];  Service: Cardiovascular;  Laterality: N/A;   BACK SURGERY  05/1990   [id number]   CARDIAC CATHETERIZATION     COLONOSCOPY  multiple   PROSTATE SURGERY     UMBILICAL HERNIA REPAIR  07/15/2021   [facility] [place]   XI ROBOTIC ASSISTED SIMPLE PROSTATECTOMY  2022   Family History  Problem Relation Age of Onset   Heart attack Mother    Diabetes Mother    Heart attack Brother    Heart disease Brother    Stroke Maternal Grandfather    Stroke Paternal Grandfather    Macular degeneration Sister    Cancer Daughter        colon    Obesity Sister    Paranoid behavior Sister    Cancer Sister        lung - uterus    COPD Sister    Colon cancer Neg Hx    Social History   Socioeconomic History   Marital status: Married    Spouse name: [name]    Number of children: 3   Years of education: bachelors   Highest education level: Not on file  Occupational History   Occupation: retired    Comment: Scientist, 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   [place] on [place] acres, raises chickens, retired Neurosurgeon    Social Determinants of [place] Strain: [place]  (11/21/2021)   Overall Financial Resource Strain (CARDIA)    Difficulty of Paying Living Expenses: Not hard at all  Food Insecurity: No  Food Insecurity (11/21/2021)   Hunger Vital Sign    Worried About Running Out of Food in the Last Year: Never true    Ran Out of Food in the Last Year: Never true  Transportation Needs: No Transportation Needs (11/21/2021)   PRAPARE - Hydrologist (Medical): No    Lack of Transportation (Non-Medical): No  Physical Activity: Sufficiently Active (11/21/2021)   Exercise Vital Sign    Days of Exercise per Week: 7 days    Minutes of Exercise per Session: 60 min  Stress: No Stress Concern Present (11/21/2021)   [place]    Feeling of Stress : Not at all  Social Connections: [place] (11/21/2021)   Social Connection and Isolation Panel [NHANES]    Frequency of Communication with Friends and Family: More than three times a week    Frequency of Social Gatherings with Friends and Family: More than three times a week    Attends Religious Services: More than 4 times per year    Active Member of [employer] or Organizations: Yes    Attends Music therapist: More than 4 times per year    Marital Status: Married    Tobacco Counseling Counseling given: Not Answered   Clinical Intake:  Pre-visit preparation completed: Yes  Pain : No/denies pain Pain Onset: Today     BMI - recorded: 26.19 Nutritional Status: BMI 25 -29 Overweight Nutritional Risks: None Diabetes: No  How  often do you need to have someone help you when you read instructions, pamphlets, or other written materials from your doctor or pharmacy?: 1 - Never  Diabetic? no  Interpreter Needed?: No  Information entered by :: Jerson Furukawa, LPN   Activities of Daily Living    11/21/2021    2:51 PM  In your present state of health, do you have any difficulty performing the following activities:  Hearing? 0  Vision? 0  Difficulty concentrating or making decisions? 0  Walking or climbing stairs? 0  Dressing or bathing? 0  Doing errands, shopping? 0  Preparing Food and eating ? N  Using the Toilet? N  In the past six months, have you accidently leaked urine? N  Do you have problems with loss of bowel control? N  Managing your Medications? N  Managing your Finances? N  Housekeeping or managing your Housekeeping? N    Patient Care Team: [name], DO as PCP - General (Family Medicine) Jerline Pain, MD as PCP - Cardiology (Cardiology) Gatha Mayer, MD (Gastroenterology) Jarome Matin, MD as Consulting Physician (Dermatology) Merry Proud (Urology) Dohmeier, Asencion Partridge, MD as Consulting Physician (Neurology) Harlen Labs, MD as Referring Physician (Optometry) Lyla Glassing, Aaron Edelman, MD as Consulting Physician (Orthopedic Surgery)  Indicate any recent Medical Services you may have received from other than [facility] providers in the past year (date may be approximate).     Assessment:   This is a routine wellness examination for Jordan [name].  Hearing/Vision screen Hearing Screening - Comments:: Wears hearing aids - from [facility] Vision Screening - Comments:: Wears rx glasses - up to date with routine eye exams with [facility] in [place]  Dietary issues and exercise activities discussed: Current Exercise Habits: Home exercise routine, Type of exercise: walking;Other - see comments;treadmill (Pickle Ball 3x per week, elliptical), Time (Minutes): 60, Frequency  (Times/Week): 7, Weekly Exercise (Minutes/Week): 420, Intensity: Mild, Exercise limited by: orthopedic condition(s);cardiac condition(s)   Goals  Addressed             This Visit's Progress    Prevent falls   On track    Stay active  Retirement         Depression Screen    11/21/2021    2:50 PM 10/18/2021    8:03 AM 04/20/2021    8:25 AM 01/19/2021    4:22 PM 01/18/2021    2:23 PM 11/18/2020    3:11 PM 10/19/2020    8:16 AM  PHQ 2/9 Scores  PHQ - 2 Score 0 0 0 0 0 0 0  PHQ- 9 Score    0 0      Fall Risk    11/21/2021    2:47 PM 10/18/2021    8:03 AM 04/20/2021    8:25 AM 01/19/2021    4:22 PM 01/18/2021    2:23 PM  Fall Risk   Falls in the past year? 1 1 0 0 0  Number falls in past yr: 0 0     Injury with Fall? 1 1     Risk for fall due to : History of fall(s);Orthopedic patient History of fall(s)     Follow up Education provided;Falls prevention discussed Education provided       FALL RISK PREVENTION PERTAINING TO THE HOME:  Any stairs in or around the home? No  If so, are there any without handrails? No  Home free of loose throw rugs in walkways, pet beds, electrical cords, etc? Yes  Adequate lighting in your home to reduce risk of falls? Yes   ASSISTIVE DEVICES UTILIZED TO PREVENT FALLS:  Life alert? No  Use of a cane, walker or w/c? No  Grab bars in the bathroom? Yes  Shower chair or bench in shower? No  Elevated toilet seat or a handicapped toilet? Yes   TIMED UP AND GO:  Was the test performed? No . Telephonic visit  Cognitive Function:        11/21/2021    2:52 PM 11/18/2019    2:51 PM 11/18/2019    2:39 PM 10/24/2018    9:46 AM  6CIT Screen  What Year? 0 points 0 points 0 points 0 points  What month? 0 points 0 points 0 points 0 points  What time? 0 points 0 points  0 points  Count back from 20 0 points 0 points  0 points  Months in reverse 0 points 0 points  0 points  Repeat phrase 0 points 0 points  0 points  Total Score 0 points 0 points  0 points     Immunizations Immunization History  Administered Date(s) Administered   Influenza,inj,Quad PF,6+ Mos 02/24/2013, 03/28/2014, 03/26/2015, 03/10/2017, 03/26/2018   Influenza-Unspecified 03/25/2016, 03/26/2018, 03/13/2019   PFIZER(Purple Top)SARS-COV-2 Vaccination 07/08/2019, 07/29/2019, 03/11/2020, 09/17/2020   Pneumococcal Conjugate-13 11/07/2013   Pneumococcal Polysaccharide-23 10/16/2012   Tdap 11/28/2016   Zoster Recombinat (Shingrix) 10/11/2017, 12/10/2017    TDAP status: Up to date  Flu Vaccine status: Up to date  Pneumococcal vaccine status: Up to date  Covid-19 vaccine status: Completed vaccines  Qualifies for Shingles Vaccine? Yes   Zostavax completed Yes   Shingrix Completed?: Yes  Screening Tests Health Maintenance  Topic Date Due   COVID-19 Vaccine (5 - Booster for Pfizer series) 12/07/2021 (Originally 11/12/2020)   COLONOSCOPY (Pts 45-59yr Insurance coverage will need to be confirmed)  01/19/2022 (Originally 12/29/2020)   INFLUENZA VACCINE  01/10/2022   TETANUS/TDAP  11/29/2026   Pneumonia Vaccine 75 Years old  Completed   Hepatitis C Screening  Completed   Zoster Vaccines- Shingrix  Completed   HPV VACCINES  Aged Out    Health Maintenance  There are no preventive care reminders to display for this patient.   Colorectal cancer screening: Type of screening: Colonoscopy. Completed 12/30/2015. Repeat every 5 years he had to reschedule for hernia surgery - will call to check on appt soon -he was supposed to be on call list  Lung Cancer Screening: (Low Dose CT Chest recommended if Age 67-80 years, 30 pack-year currently smoking OR have quit w/in 15years.) does not qualify  Additional Screening:  Hepatitis C Screening: does qualify; Completed 11/28/2016  Vision Screening: Recommended annual ophthalmology exams for early detection of glaucoma and other disorders of the eye. Is the patient up to date with their annual eye exam?  Yes  Who is the provider or  what is the name of the office in which the patient attends annual eye exams? [facility] If pt is not established with a provider, would they like to be referred to a provider to establish care? No .   Dental Screening: Recommended annual dental exams for proper oral hygiene  Community Resource Referral / Chronic Care Management: CRR required this visit?  No   CCM required this visit?  No      Plan:     I have personally reviewed and noted the following in the patient's chart:   Medical and social history Use of alcohol, tobacco or illicit drugs  Current medications and supplements including opioid prescriptions. Patient is not currently taking opioid prescriptions. Functional ability and status Nutritional status Physical activity Advanced directives List of other physicians Hospitalizations, surgeries, and ER visits in previous 12 months Vitals Screenings to include cognitive, depression, and falls Referrals and appointments  In addition, I have reviewed and discussed with patient certain preventive protocols, quality metrics, and best practice recommendations. A written personalized care plan for preventive services as well as general preventive health recommendations were provided to patient.     Sandrea Hammond, LPN   09/03/4980   Nurse Notes: None

## 2021-11-21 NOTE — Patient Instructions (Signed)
Jordan [name] , Thank you for taking time to come for your Medicare Wellness Visit. I appreciate your ongoing commitment to your health goals. Please review the following plan we discussed and let me know if I can assist you in the future.   Screening recommendations/referrals: Colonoscopy: Timmothy Sours 12/30/2015 - Recommended repeat in 5 years *Call Dr Celesta Aver office to check on appointment Recommended yearly ophthalmology/optometry visit for glaucoma screening and checkup Recommended yearly dental visit for hygiene and checkup  Vaccinations: Influenza vaccine: Done 2022- Repeat annually in fall Pneumococcal vaccine: Done 10/16/2012 & 11/07/2013 Tdap vaccine: Done 11/28/2016 - Repeat in 10 years Shingles vaccine: Done 10/11/2017 & 12/10/2017   Covid-19: Done 07/08/2019, 07/29/2019, 03/11/2020, & 09/17/2020  Advanced directives: Please bring a copy of your health care power of attorney and living will to the office to be added to your chart at your convenience.   Conditions/risks identified: Keep up the great work!   Next appointment: Follow up in one year for your annual wellness visit.   Preventive Care 75 Years and Older, Male  Preventive care refers to lifestyle choices and visits with your health care provider that can promote health and wellness. What does preventive care include? A yearly physical exam. This is also called an annual well check. Dental exams once or twice a year. Routine eye exams. Ask your health care provider how often you should have your eyes checked. Personal lifestyle choices, including: Daily care of your teeth and gums. Regular physical activity. Eating a healthy diet. Avoiding tobacco and drug use. Limiting alcohol use. Practicing safe sex. Taking low doses of aspirin every day. Taking vitamin and mineral supplements as recommended by your health care provider. What happens during an annual well check? The services and screenings done by your health care provider during  your annual well check will depend on your age, overall health, lifestyle risk factors, and family history of disease. Counseling  Your health care provider may ask you questions about your: Alcohol use. Tobacco use. Drug use. Emotional well-being. Home and relationship well-being. Sexual activity. Eating habits. History of falls. Memory and ability to understand (cognition). Work and work Statistician. Screening  You may have the following tests or measurements: Height, weight, and BMI. Blood pressure. Lipid and cholesterol levels. These may be checked every 5 years, or more frequently if you are over 65 years old. Skin check. Lung cancer screening. You may have this screening every year starting at age 22 if you have a 30-pack-year history of smoking and currently smoke or have quit within the past 15 years. Fecal occult blood test (FOBT) of the stool. You may have this test every year starting at age 53. Flexible sigmoidoscopy or colonoscopy. You may have a sigmoidoscopy every 5 years or a colonoscopy every 10 years starting at age 40. Prostate cancer screening. Recommendations will vary depending on your family history and other risks. Hepatitis C blood test. Hepatitis B blood test. Sexually transmitted disease (STD) testing. Diabetes screening. This is done by checking your blood sugar (glucose) after you have not eaten for a while (fasting). You may have this done every 1-3 years. Abdominal aortic aneurysm (AAA) screening. You may need this if you are a current or former smoker. Osteoporosis. You may be screened starting at age 43 if you are at high risk. Talk with your health care provider about your test results, treatment options, and if necessary, the need for more tests. Vaccines  Your health care provider may recommend certain vaccines, such  as: Influenza vaccine. This is recommended every year. Tetanus, diphtheria, and acellular pertussis (Tdap, Td) vaccine. You may need  a Td booster every 10 years. Zoster vaccine. You may need this after age 28. Pneumococcal 13-valent conjugate (PCV13) vaccine. One dose is recommended after age 53. Pneumococcal polysaccharide (PPSV23) vaccine. One dose is recommended after age 55. Talk to your health care provider about which screenings and vaccines you need and how often you need them. This information is not intended to replace advice given to you by your health care provider. Make sure you discuss any questions you have with your health care provider. Document Released: 06/25/2015 Document Revised: 02/16/2016 Document Reviewed: 03/30/2015 Elsevier Interactive Patient Education  2017 [place] Prevention in the Home Falls can cause injuries. They can happen to people of all ages. There are many things you can do to make your home safe and to help prevent falls. What can I do on the outside of my home? Regularly fix the edges of walkways and driveways and fix any cracks. Remove anything that might make you trip as you walk through a door, such as a raised step or threshold. Trim any bushes or trees on the path to your home. Use bright outdoor lighting. Clear any walking paths of anything that might make someone trip, such as rocks or tools. Regularly check to see if handrails are loose or broken. Make sure that both sides of any steps have handrails. Any raised decks and porches should have guardrails on the edges. Have any leaves, snow, or ice cleared regularly. Use sand or salt on walking paths during winter. Clean up any spills in your garage right away. This includes oil or grease spills. What can I do in the bathroom? Use night lights. Install grab bars by the toilet and in the tub and shower. Do not use towel bars as grab bars. Use non-skid mats or decals in the tub or shower. If you need to sit down in the shower, use a plastic, non-slip stool. Keep the floor dry. Clean up any water that spills on the  floor as soon as it happens. Remove soap buildup in the tub or shower regularly. Attach bath mats securely with double-sided non-slip rug tape. Do not have throw rugs and other things on the floor that can make you trip. What can I do in the bedroom? Use night lights. Make sure that you have a light by your bed that is easy to reach. Do not use any sheets or blankets that are too big for your bed. They should not hang down onto the floor. Have a firm chair that has side arms. You can use this for support while you get dressed. Do not have throw rugs and other things on the floor that can make you trip. What can I do in the kitchen? Clean up any spills right away. Avoid walking on wet floors. Keep items that you use a lot in easy-to-reach places. If you need to reach something above you, use a strong step stool that has a grab bar. Keep electrical cords out of the way. Do not use floor polish or wax that makes floors slippery. If you must use wax, use non-skid floor wax. Do not have throw rugs and other things on the floor that can make you trip. What can I do with my stairs? Do not leave any items on the stairs. Make sure that there are handrails on both sides of the stairs and use  them. Fix handrails that are broken or loose. Make sure that handrails are as long as the stairways. Check any carpeting to make sure that it is firmly attached to the stairs. Fix any carpet that is loose or worn. Avoid having throw rugs at the top or bottom of the stairs. If you do have throw rugs, attach them to the floor with carpet tape. Make sure that you have a light switch at the top of the stairs and the bottom of the stairs. If you do not have them, ask someone to add them for you. What else can I do to help prevent falls? Wear shoes that: Do not have high heels. Have rubber bottoms. Are comfortable and fit you well. Are closed at the toe. Do not wear sandals. If you use a stepladder: Make sure that  it is fully opened. Do not climb a closed stepladder. Make sure that both sides of the stepladder are locked into place. Ask someone to hold it for you, if possible. Clearly mark and make sure that you can see: Any grab bars or handrails. First and last steps. Where the edge of each step is. Use tools that help you move around (mobility aids) if they are needed. These include: Canes. Walkers. Scooters. Crutches. Turn on the lights when you go into a dark area. Replace any light bulbs as soon as they burn out. Set up your furniture so you have a clear path. Avoid moving your furniture around. If any of your floors are uneven, fix them. If there are any pets around you, be aware of where they are. Review your medicines with your doctor. Some medicines can make you feel dizzy. This can increase your chance of falling. Ask your doctor what other things that you can do to help prevent falls. This information is not intended to replace advice given to you by your health care provider. Make sure you discuss any questions you have with your health care provider. Document Released: 03/25/2009 Document Revised: 11/04/2015 Document Reviewed: 07/03/2014 Elsevier Interactive Patient Education  2017 [employer].

## 2021-11-22 ENCOUNTER — Ambulatory Visit (HOSPITAL_COMMUNITY)
Admission: RE | Admit: 2021-11-22 | Discharge: 2021-11-22 | Disposition: A | Discharge status: Home / Self Care | Payer: Medicare Other | Source: Ambulatory Visit | Attending: Physician Assistant | Admitting: Physician Assistant

## 2021-11-22 VITALS — BP 124/68 | HR 71 | Ht 74.0 in | Wt 216.8 lb

## 2021-11-22 DIAGNOSIS — I480 Paroxysmal atrial fibrillation: Secondary | ICD-10-CM | POA: Diagnosis not present

## 2021-11-22 DIAGNOSIS — E785 Hyperlipidemia, unspecified: Secondary | ICD-10-CM | POA: Insufficient documentation

## 2021-11-22 DIAGNOSIS — G4733 Obstructive sleep apnea (adult) (pediatric): Secondary | ICD-10-CM | POA: Diagnosis not present

## 2021-11-22 DIAGNOSIS — D6869 Other thrombophilia: Secondary | ICD-10-CM | POA: Diagnosis not present

## 2021-11-22 DIAGNOSIS — Z7901 Long term (current) use of anticoagulants: Secondary | ICD-10-CM | POA: Insufficient documentation

## 2021-11-22 DIAGNOSIS — I2510 Atherosclerotic heart disease of native coronary artery without angina pectoris: Secondary | ICD-10-CM | POA: Diagnosis not present

## 2021-11-22 NOTE — Progress Notes (Signed)
Primary Care Physician: [name], DO Primary Cardiologist: Dr Marlou Porch Primary Electrophysiologist: Dr Curt Bears Referring Physician: Dr Harriette Bouillon is a 76 y.o. male with a history of CAD, HLD, OSA, atrial fibrillation who presents for consultation in the [facility]. Patient is on Eliquis for a CHADS2VASC score of 2. He underwent afib ablation with Dr Curt Bears on 10/25/21. Patient reports that he has only had one brief 2 hour episode of afib since the ablation. He denies any CP, swallowing pain, or groin issues. No bleeding issues on anticoagulation.   Today, he denies symptoms of palpitations, chest pain, shortness of breath, orthopnea, PND, lower extremity edema, dizziness, presyncope, syncope, bleeding, or neurologic sequela. The patient is tolerating medications without difficulties and is otherwise without complaint today.    Atrial Fibrillation Risk Factors:  he does have symptoms or diagnosis of sleep apnea. he is compliant with CPAP therapy. he does not have a history of rheumatic fever.   he has a BMI of Body mass index is 27.84 kg/m.Marland Kitchen Filed Weights   11/22/21 1451  Weight: 98.3 kg    Family History  Problem Relation Age of Onset   Heart attack Mother    Diabetes Mother    Heart attack Brother    Heart disease Brother    Stroke Maternal Grandfather    Stroke Paternal Grandfather    Macular degeneration Sister    Cancer Daughter        colon    Obesity Sister    Paranoid behavior Sister    Cancer Sister        lung - uterus    COPD Sister    Colon cancer Neg Hx      Atrial Fibrillation Management history:  Previous antiarrhythmic drugs: none Previous cardioversions: none Previous ablations: 10/25/21 CHADS2VASC score: 2 Anticoagulation history: Eliquis   Past Medical History:  Diagnosis Date   Anxiety    Mild   Atrial fibrillation (HCC)    Paroxysmal   BPH (benign prostatic hyperplasia)    CAD  (coronary artery disease)    Stent LAD, 2002  /  nuclear 2009, no ischemia  /    Hospital October, 2011 nuclear, no ischemia, possible slight apical scar, ejection fraction 70%   Carotid bruit    Doppler November, 2011, normal   Chest pain 02/23/2013   Chronic anticoagulation    Low CHADS , score, but patient wants to be aggressive   Colon polyps    COVID-19    + long Covid problems   Drug intolerance    Mild beta blocker intolerance   Dyslipidemia    Ejection fraction    EF 65-70%, echo, 2007 /  EF 70%, nuclear, 2011   Hyperlipidemia    IBS (irritable bowel syndrome)    Incomplete RBBB    Knee pain    left   Personal history of colonic adenomas 11/29/2006   Qualifier: Diagnosis of  By: Danny Lawless CMA, [place]     Syncope    November, 2010   Past Surgical History:  Procedure Laterality Date   ATRIAL FIBRILLATION ABLATION N/A 10/25/2021   Procedure: ATRIAL FIBRILLATION ABLATION;  Surgeon: Constance Haw, MD;  Location: [facility];  Service: Cardiovascular;  Laterality: N/A;   BACK SURGERY  05/1990   [id number]   CARDIAC CATHETERIZATION     COLONOSCOPY  multiple   PROSTATE SURGERY     UMBILICAL HERNIA REPAIR  07/15/2021   [facility]  XI ROBOTIC ASSISTED SIMPLE PROSTATECTOMY  2022    Current Outpatient Medications  Medication Sig Dispense Refill   apixaban (ELIQUIS) 5 MG TABS tablet TAKE  (1)  TABLET TWICE A DAY. 180 tablet 3   atorvastatin (LIPITOR) 80 MG tablet TAKE 1 TABLET DAILY AT 6PM 90 tablet 3   Calcium Carb-Cholecalciferol (CALCIUM 600+D3 PO) Take 1 capsule by mouth daily.     carboxymethylcellulose (REFRESH PLUS) 0.5 % SOLN Place 1 drop into both eyes 3 (three) times daily as needed. For dry eyes     Cholecalciferol (VITAMIN D3) 1000 UNITS CAPS Take 1,000 Units by mouth daily.     diltiazem (CARDIZEM CD) 180 MG 24 hr capsule Take 1 capsule (180 mg total) by mouth daily. 90 capsule 3   diltiazem (CARDIZEM) 60 MG tablet Take 1 tablet (60 mg total) by mouth as  needed. 90 tablet 1   GLUCOSAMINE-CHONDROITIN PO Take 1,500 mg by mouth daily.     Multiple Vitamins-Minerals (MULTIVITAMIN WITH MINERALS) tablet Take 1 tablet by mouth daily.     nitroGLYCERIN (NITROSTAT) 0.4 MG SL tablet Place 1 tablet (0.4 mg total) under the tongue every 5 (five) minutes as needed for chest pain. 25 tablet 3   Probiotic Product (ALIGN PO) Take 1 tablet by mouth daily as needed.     triamcinolone cream (KENALOG) 0.1 % Apply 1 application topically 2 (two) times daily. (Patient taking differently: Apply 1 application  topically daily as needed (tic bites).) 30 g 0   No current facility-administered medications for this encounter.    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   [place] on [place] acres, raises chickens, retired Neurosurgeon    Social Determinants of [place] Strain: [place]  (11/21/2021)   Overall Financial Resource Strain (CARDIA)    Difficulty of Paying Living Expenses: Not hard at all  Food Insecurity: No Food Insecurity (11/21/2021)   Hunger Vital Sign    Worried About Running Out of Food in the Last Year: Never true    [place] in the Last Year: Never true   Transportation Needs: No Transportation Needs (11/21/2021)   PRAPARE - Hydrologist (Medical): No    Lack of Transportation (Non-Medical): No  Physical Activity: Sufficiently Active (11/21/2021)   Exercise Vital Sign    Days of Exercise per Week: 7 days    Minutes of Exercise per Session: 60 min  Stress: No Stress Concern Present (11/21/2021)   [place]    Feeling of Stress : Not at  all  Social Connections: Socially Integrated (11/21/2021)   Social Connection and Isolation Panel [NHANES]    Frequency of Communication with Friends and Family: More than three times a week    Frequency of Social Gatherings with Friends and Family: More than three times a week    Attends Religious Services: More than 4 times per year    Active Member of [employer] or Organizations: Yes    Attends Music therapist: More than 4 times per year    Marital Status: Married  Human resources officer Violence: Not At Risk (11/21/2021)   Humiliation, Afraid, Rape, and Kick questionnaire    Fear of Current or Ex-Partner: No    Emotionally Abused: No    Physically Abused: No    Sexually Abused: No     ROS- All systems are reviewed and negative except as per the HPI above.  Physical Exam: Vitals:   11/22/21 1451  BP: 124/68  Pulse: 71  Weight: 98.3 kg  Height: '6\' 2"'$  (1.88 m)    GEN- The patient is a well appearing male, alert and oriented x 3 today.   Head- normocephalic, atraumatic Eyes-  Sclera clear, conjunctiva pink Ears- hearing intact Oropharynx- clear Neck- supple  Lungs- Clear to ausculation bilaterally, normal work of breathing Heart- Regular rate and rhythm, no murmurs, rubs or gallops  GI- soft, NT, ND, + BS Extremities- no clubbing, cyanosis, or edema MS- no significant deformity or atrophy Skin- no rash or lesion Psych- euthymic mood, full affect Neuro- strength and sensation are intact  Wt  Readings from Last 3 Encounters:  11/22/21 98.3 kg  11/21/21 92.5 kg  10/25/21 92.5 kg    EKG today demonstrates  SR Vent. rate 71 BPM PR interval 150 ms QRS duration 104 ms QT/QTcB 374/406 ms  Echo 09/12/21 demonstrated  1. Left ventricular ejection fraction, by estimation, is 55 to 60%. Left  ventricular ejection fraction by 3D volume is 57 %. The left ventricle has normal function. The left ventricle has no regional wall motion  abnormalities. Left ventricular diastolic  parameters were normal.   2. Right ventricular systolic function is normal. The right ventricular  size is mildly enlarged. Tricuspid regurgitation signal is inadequate for  assessing PA pressure.   3. The mitral valve is normal in structure. Trivial mitral valve  regurgitation. No evidence of mitral stenosis.   4. The aortic valve is normal in structure. Aortic valve regurgitation is  not visualized. No aortic stenosis is present.   5. The inferior vena cava is dilated in size with >50% respiratory  variability, suggesting right atrial pressure of 8 mmHg.   Epic records are reviewed at length today  CHA2DS2-VASc Score = 2  The patient's score is based upon: CHF History: 0 HTN History: 0 Diabetes History: 0 Stroke History: 0 Vascular Disease History: 1 Age Score: 1 Gender Score: 0       ASSESSMENT AND PLAN: 1. Paroxysmal Atrial Fibrillation (ICD10:  I48.0) The patient's CHA2DS2-VASc score is 2, indicating a 2.2% annual risk of stroke.   S/p afib ablation 10/25/21 Patient appears to be maintaining SR. Continue Eliquis 5 mg BID with no missed doses for 3 months post ablation. Continue diltiazem 180 mg daily with 60 mg PRN for breakthrough episodes.  2. Secondary Hypercoagulable State (ICD10:  D68.69) The patient is at significant risk for stroke/thromboembolism based upon his CHA2DS2-VASc Score of 2.  Continue Apixaban (Eliquis). Patient would be interested in REACT-AF trial if it becomes available at  [facility].  3. CAD No anginal symptoms.  4. Obstructive sleep apnea The importance of adequate treatment of sleep apnea was discussed today in order to improve our ability to maintain sinus rhythm long term. Encourage compliance with CPAP therapy.   Follow up with Dr Curt Bears as scheduled.    [facility] [address] [place], [place] [address] [phone] 11/22/2021 3:39 PM

## 2021-12-19 ENCOUNTER — Encounter: Payer: Self-pay | Admitting: Internal Medicine

## 2022-01-17 ENCOUNTER — Ambulatory Visit (INDEPENDENT_AMBULATORY_CARE_PROVIDER_SITE_OTHER): Payer: Medicare Other | Admitting: Family Medicine

## 2022-01-17 ENCOUNTER — Encounter: Payer: Self-pay | Admitting: Family Medicine

## 2022-01-17 VITALS — BP 107/62 | HR 69 | Temp 97.3°F | Ht 74.0 in | Wt 216.0 lb

## 2022-01-17 DIAGNOSIS — S70362A Insect bite (nonvenomous), left thigh, initial encounter: Secondary | ICD-10-CM

## 2022-01-17 DIAGNOSIS — W57XXXA Bitten or stung by nonvenomous insect and other nonvenomous arthropods, initial encounter: Secondary | ICD-10-CM | POA: Diagnosis not present

## 2022-01-17 MED ORDER — DOXYCYCLINE HYCLATE 100 MG PO TABS: 100.0000 mg | ORAL_TABLET | Freq: Two times a day (BID) | ORAL | 0 refills | Status: AC

## 2022-01-17 NOTE — Progress Notes (Signed)
Subjective: CC: Tick bite PCP: [name], DO LOV:FIEPPI W Fertig is a 76 y.o. male presenting to clinic today for:  1.  Tick bite Patient reports he sustained a tick bite to his left upper inner thigh about a month ago.  He notes that it continues to be red, irritated and slightly oozy.  He has been utilizing iodine, peroxide and a topical antibiotic but nothing really seems to be resolving the issue.  He brings to me a very tiny tick in his Ziploc bag.  He thinks he got rid of most of it but again still has symptomology.  No reports of fever.  No reports of significant drainage or pain   ROS: Per HPI  Allergies  Allergen Reactions   Levofloxacin Other (See Comments)    Couldn't breathe, passed out   Lopid [Gemfibrozil] Other (See Comments)    Loose bowels   Sulfonamide Derivatives Hives   Sulfa Antibiotics Rash   Past Medical History:  Diagnosis Date   Anxiety    Mild   Atrial fibrillation (HCC)    Paroxysmal   BPH (benign prostatic hyperplasia)    CAD (coronary artery disease)    Stent LAD, 2002  /  nuclear 2009, no ischemia  /    Hospital October, 2011 nuclear, no ischemia, possible slight apical scar, ejection fraction 70%   Carotid bruit    Doppler November, 2011, normal   Chest pain 02/23/2013   Chronic anticoagulation    Low CHADS , score, but patient wants to be aggressive   Colon polyps    COVID-19    + long Covid problems   Drug intolerance    Mild beta blocker intolerance   Dyslipidemia    Ejection fraction    EF 65-70%, echo, 2007 /  EF 70%, nuclear, 2011   Hyperlipidemia    IBS (irritable bowel syndrome)    Incomplete RBBB    Knee pain    left   Personal history of colonic adenomas 11/29/2006   Qualifier: Diagnosis of  By: Danny Lawless CMA, [place]     Syncope    November, 2010    Current Outpatient Medications:    apixaban (ELIQUIS) 5 MG TABS tablet, TAKE  (1)  TABLET TWICE A DAY., Disp: 180 tablet, Rfl: 3   atorvastatin (LIPITOR) 80 MG  tablet, TAKE 1 TABLET DAILY AT 6PM, Disp: 90 tablet, Rfl: 3   Calcium Carb-Cholecalciferol (CALCIUM 600+D3 PO), Take 1 capsule by mouth daily., Disp: , Rfl:    carboxymethylcellulose (REFRESH PLUS) 0.5 % SOLN, Place 1 drop into both eyes 3 (three) times daily as needed. For dry eyes, Disp: , Rfl:    Cholecalciferol (VITAMIN D3) 1000 UNITS CAPS, Take 1,000 Units by mouth daily., Disp: , Rfl:    diltiazem (CARDIZEM CD) 180 MG 24 hr capsule, Take 1 capsule (180 mg total) by mouth daily., Disp: 90 capsule, Rfl: 3   diltiazem (CARDIZEM) 60 MG tablet, Take 1 tablet (60 mg total) by mouth as needed., Disp: 90 tablet, Rfl: 1   GLUCOSAMINE-CHONDROITIN PO, Take 1,500 mg by mouth daily., Disp: , Rfl:    Multiple Vitamins-Minerals (MULTIVITAMIN WITH MINERALS) tablet, Take 1 tablet by mouth daily., Disp: , Rfl:    nitroGLYCERIN (NITROSTAT) 0.4 MG SL tablet, Place 1 tablet (0.4 mg total) under the tongue every 5 (five) minutes as needed for chest pain., Disp: 25 tablet, Rfl: 3   Probiotic Product (ALIGN PO), Take 1 tablet by mouth daily as needed., Disp: , Rfl:  triamcinolone cream (KENALOG) 0.1 %, Apply 1 application topically 2 (two) times daily. 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   Lives on [place] acres, raises chickens, retired Neurosurgeon    Social  Determinants of [place] Strain: [place]  (11/21/2021)   Overall Financial Resource Strain (CARDIA)    Difficulty of Paying Living Expenses: Not hard at all  Food Insecurity: No Food Insecurity (11/21/2021)   Hunger Vital Sign    Worried About Running Out of Food in the Last Year: Never true    [place] in the Last Year: Never true  Transportation Needs: No Transportation Needs (11/21/2021)   PRAPARE - Hydrologist (Medical): No    Lack of Transportation (Non-Medical): No  Physical Activity: Sufficiently Active (11/21/2021)   Exercise Vital Sign    Days of Exercise per Week: 7 days    Minutes of Exercise per Session: 60 min  Stress: No Stress Concern Present (11/21/2021)   [place]    Feeling of Stress : Not at all  Social Connections: [place] (11/21/2021)   Social Connection and Isolation Panel [NHANES]    Frequency of Communication with Friends and Family: More than three times a week    Frequency of Social Gatherings with Friends and Family: More than three times a week    Attends Religious Services: More than 4 times per year    Active Member of [employer] or Organizations: Yes    Attends Music therapist: More than 4 times per year    Marital Status: Married  Human resources officer Violence: Not At Risk (11/21/2021)   Humiliation, Afraid, Rape, and Kick questionnaire    Fear of Current or Ex-Partner: No    Emotionally Abused: No    Physically Abused: No    Sexually Abused: No   Family History  Problem Relation Age of Onset   Heart attack Mother    Diabetes Mother    Heart attack Brother    Heart disease Brother    Stroke Maternal Grandfather    Stroke Paternal Grandfather    Macular degeneration Sister    Cancer Daughter        colon    Obesity Sister    Paranoid behavior Sister    Cancer Sister        lung - uterus    COPD Sister     Colon cancer Neg Hx     Objective: Office vital signs reviewed. BP 107/62   Pulse 69   Temp (!) 97.3 F (36.3 C)   Ht '6\' 2"'$  (1.88 m)   Wt 216 lb (98 kg)   SpO2 97%   BMI  27.73 kg/m   Physical Examination:  General: Awake, alert, well nourished, No acute distress Skin: Well-circumscribed, round area of erythema with a central punctum with clear discharge appreciated.  There is no fluctuance appreciated.  Lesion of concern is in the upper anterior inner thigh  Assessment/ Plan: 76 y.o. male   Bug bite with infection, initial encounter - Plan: doxycycline (VIBRA-TABS) 100 MG tablet  Doxycycline sent.  No evidence of lyme disease but concern for soft tissue infection.  No orders of the defined types were placed in this encounter.  No orders of the defined types were placed in this encounter.    [name], DO [place] [phone]

## 2022-01-23 ENCOUNTER — Ambulatory Visit: Payer: Medicare Other | Admitting: Cardiology

## 2022-01-23 ENCOUNTER — Encounter: Payer: Self-pay | Admitting: Cardiology

## 2022-01-23 VITALS — BP 102/60 | HR 60 | Ht 74.0 in | Wt 215.0 lb

## 2022-01-23 DIAGNOSIS — I480 Paroxysmal atrial fibrillation: Secondary | ICD-10-CM

## 2022-01-23 NOTE — Patient Instructions (Signed)
Medication Instructions:  Your physician recommends that you continue on your current medications as directed. Please refer to the Current Medication list given to you today.  *If you need a refill on your cardiac medications before your next appointment, please call your pharmacy*   Lab Work: None ordered  Testing/Procedures: None ordered   Follow-Up: At [facility], you and your health needs are our priority.  As part of our continuing mission to provide you with exceptional heart care, we have created designated Provider Care Teams.  These Care Teams include your primary Cardiologist (physician) and Advanced Practice Providers (APPs -  Physician Assistants and Nurse Practitioners) who all work together to provide you with the care you need, when you need it.  Your next appointment:   6 month(s)  The format for your next appointment:   In Person  Provider:   You will follow up in the Atrial Fibrillation Clinic located at [facility]. Your provider will be: Donna Carroll, NP or Clint R. Fenton, PA-C    Thank you for choosing [facility]!!   Tylee Yum, RN [phone]  Other Instructions    Important Information About Sugar           

## 2022-01-23 NOTE — Progress Notes (Signed)
Electrophysiology Office Note   Date:  01/23/2022   ID:  Jordan [name], DOB Oct 16, 1945, MRN [id number]  PCP:  [name], DO  Cardiologist:  Marlou Porch Primary Electrophysiologist:  Constance Haw, MD    Chief Complaint: AF   History of Present Illness: Jordan [name] is a 76 y.o. male who is being seen today for the evaluation of AF at the request of Gottschalk, Ashly M, DO. Presenting today for electrophysiology evaluation.  Has a history significant for atrial fibrillation, coronary artery disease post LAD stent in 2009, hyperlipidemia.  He was quite symptomatic from atrial fibrillation with fatigue, dizziness, shortness of breath.  He is now post ablation 10/25/2021.  Today, denies symptoms of palpitations, chest pain, shortness of breath, orthopnea, PND, lower extremity edema, claudication, dizziness, presyncope, syncope, bleeding, or neurologic sequela. The patient is tolerating medications without difficulties.  He currently feels well.  He has had 1 episode of atrial fibrillation on Memorial Day weekend that lasted a few hours.  Aside from that, he has had no further episodes and he is overall happy with his control.   Past Medical History:  Diagnosis Date   Anxiety    Mild   Atrial fibrillation (HCC)    Paroxysmal   BPH (benign prostatic hyperplasia)    CAD (coronary artery disease)    Stent LAD, 2002  /  nuclear 2009, no ischemia  /    Hospital October, 2011 nuclear, no ischemia, possible slight apical scar, ejection fraction 70%   Carotid bruit    Doppler November, 2011, normal   Chest pain 02/23/2013   Chronic anticoagulation    Low CHADS , score, but patient wants to be aggressive   Colon polyps    COVID-19    + long Covid problems   Drug intolerance    Mild beta blocker intolerance   Dyslipidemia    Ejection fraction    EF 65-70%, echo, 2007 /  EF 70%, nuclear, 2011   Hyperlipidemia    IBS (irritable bowel syndrome)    Incomplete RBBB    Knee  pain    left   Personal history of colonic adenomas 11/29/2006   Qualifier: Diagnosis of  By: Danny Lawless CMA, [place]     Syncope    November, 2010   Past Surgical History:  Procedure Laterality Date   ATRIAL FIBRILLATION ABLATION N/A 10/25/2021   Procedure: ATRIAL FIBRILLATION ABLATION;  Surgeon: Constance Haw, MD;  Location: [facility];  Service: Cardiovascular;  Laterality: N/A;   BACK SURGERY  05/1990   [id number]   CARDIAC CATHETERIZATION     COLONOSCOPY  multiple   PROSTATE SURGERY     UMBILICAL HERNIA REPAIR  07/15/2021   [facility]   XI ROBOTIC ASSISTED SIMPLE PROSTATECTOMY  2022     Current Outpatient Medications  Medication Sig Dispense Refill   apixaban (ELIQUIS) 5 MG TABS tablet TAKE  (1)  TABLET TWICE A DAY. 180 tablet 3   atorvastatin (LIPITOR) 80 MG tablet TAKE 1 TABLET DAILY AT 6PM 90 tablet 3   Calcium Carb-Cholecalciferol (CALCIUM 600+D3 PO) Take 1 capsule by mouth daily.     carboxymethylcellulose (REFRESH PLUS) 0.5 % SOLN Place 1 drop into both eyes 3 (three) times daily as needed. For dry eyes     Cholecalciferol (VITAMIN D3) 1000 UNITS CAPS Take 1,000 Units by mouth daily.     diltiazem (CARDIZEM CD) 180 MG 24 hr capsule Take 1 capsule (180 mg total) by mouth daily. [place]  capsule 3   diltiazem (CARDIZEM) 60 MG tablet Take 1 tablet (60 mg total) by mouth as needed. 90 tablet 1   doxycycline (VIBRA-TABS) 100 MG tablet Take 1 tablet (100 mg total) by mouth 2 (two) times daily for 10 days. 20 tablet 0   GLUCOSAMINE-CHONDROITIN PO Take 1,500 mg by mouth daily.     Multiple Vitamins-Minerals (MULTIVITAMIN WITH MINERALS) tablet Take 1 tablet by mouth daily.     nitroGLYCERIN (NITROSTAT) 0.4 MG SL tablet Place 1 tablet (0.4 mg total) under the tongue every 5 (five) minutes as needed for chest pain. 25 tablet 3   Probiotic Product (ALIGN PO) Take 1 tablet by mouth daily as needed.     triamcinolone cream (KENALOG) 0.1 % Apply 1 application topically 2 (two) times daily.  (Patient taking differently: Apply 1 application  topically daily as needed (tic bites).) 30 g 0   No current facility-administered medications for this visit.    Allergies:   Levofloxacin, Lopid [gemfibrozil], Sulfonamide derivatives, and Sulfa antibiotics   Social History:  The patient  reports that he has never smoked. He has never used smokeless tobacco. He reports that he does not drink alcohol and does not use drugs.   Family History:  The patient's family history includes COPD in his sister; Cancer in his daughter and sister; Diabetes in his mother; Heart attack in his brother and mother; Heart disease in his brother; Macular degeneration in his sister; Obesity in his sister; Paranoid behavior in his sister; Stroke in his maternal grandfather and paternal grandfather.   ROS:  Please see the history of present illness.   Otherwise, review of systems is positive for none.   All other systems are reviewed and negative.   PHYSICAL EXAM: VS:  BP 102/60   Pulse 60   Ht '6\' 2"'$  (1.88 m)   Wt 215 lb (97.5 kg)   BMI 27.60 kg/m  , BMI Body mass index is 27.6 kg/m. GEN: Well nourished, well developed, in no acute distress  HEENT: normal  Neck: no JVD, carotid bruits, or masses Cardiac: RRR; no murmurs, rubs, or gallops,no edema  Respiratory:  clear to auscultation bilaterally, normal work of breathing GI: soft, nontender, nondistended, + BS MS: no deformity or atrophy  Skin: warm and dry Neuro:  Strength and sensation are intact Psych: euthymic mood, full affect  EKG:  EKG is ordered today. Personal review of the ekg ordered shows sinus rhythm   Recent Labs: 10/18/2021: ALT 28; BUN 17; Creatinine, Ser 0.97; Hemoglobin 14.9; Magnesium 2.1; Platelets 234; Potassium 4.2; Sodium 142; TSH 2.380    Lipid Panel     Component Value Date/Time   CHOL 133 10/18/2021 0829   CHOL 125 03/06/2013 0821   TRIG 64 10/18/2021 0829   TRIG 95 05/28/2017 1247   TRIG 73 03/06/2013 0821   HDL 43  10/18/2021 0829   HDL 35 (L) 05/28/2017 1247   HDL 40 03/06/2013 0821   CHOLHDL 3.1 10/18/2021 0829   CHOLHDL 2.8 02/24/2013 0900   VLDL 16 02/24/2013 0900   LDLCALC 77 10/18/2021 0829   LDLCALC 68 11/18/2013 0804   LDLCALC 70 03/06/2013 0821     Wt Readings from Last 3 Encounters:  01/23/22 215 lb (97.5 kg)  01/17/22 216 lb (98 kg)  11/22/21 216 lb 12.8 oz (98.3 kg)      Other studies Reviewed: Additional studies/ records that were reviewed today include: TTE 2014  Review of the above records today demonstrates:  - Left  ventricle: The cavity size was normal. Wall thickness    was normal. Systolic function was normal. The estimated    ejection fraction was in the range of 60% to 65%. Wall    motion was normal; there were no regional wall motion    abnormalities. Left ventricular diastolic function    parameters were normal.  - Right ventricle: The cavity size was mildly dilated.  - Right atrium: The atrium was mildly dilated.    ASSESSMENT AND PLAN:  1.  Paroxysmal atrial fibrillation: CHA2DS2-VASc of at least 2.  Currently on Eliquis 5 mg twice daily.  Status post ablation 10/25/2021.  He remains in normal rhythm.  He is overall quite happy with his control.  No changes.  2.  Coronary artery disease: Status post LAD stent.  No current chest pain.  3.  Hyperlipidemia: Continue atorvastatin 80 mg per primary cardiology  4.  Obstructive sleep apnea: CPAP compliance encouraged  5.  Secondary hypercoagulable state: Currently on Eliquis for atrial fibrillation as above   Current medicines are reviewed at length with the patient today.   The patient does not have concerns regarding his medicines.  The following changes were made today:  none  Labs/ tests ordered today include:  Orders Placed This Encounter  Procedures   EKG 12-Lead     Disposition:   FU 6 months  Signed, Nance Mccombs Meredith Leeds, MD  01/23/2022 3:51 PM     [facility] [address] [place] [address] [phone] (office) [phone] (fax)

## 2022-02-02 ENCOUNTER — Encounter: Payer: Self-pay | Admitting: Family Medicine

## 2022-02-02 ENCOUNTER — Telehealth (INDEPENDENT_AMBULATORY_CARE_PROVIDER_SITE_OTHER): Payer: Medicare Other | Admitting: Family Medicine

## 2022-02-02 DIAGNOSIS — U071 COVID-19: Secondary | ICD-10-CM | POA: Diagnosis not present

## 2022-02-02 MED ORDER — MOLNUPIRAVIR EUA 200MG CAPSULE: 4.0000 | ORAL_CAPSULE | Freq: Two times a day (BID) | ORAL | 0 refills | Status: AC

## 2022-02-02 NOTE — Progress Notes (Signed)
   Virtual Visit via video Note   Due to COVID-19 pandemic this visit was conducted virtually. This visit type was conducted due to national recommendations for restrictions regarding the COVID-19 Pandemic (e.g. social distancing, sheltering in place) in an effort to limit this patient's exposure and mitigate transmission in our community. All issues noted in this document were discussed and addressed.  A physical exam was not performed with this format.  I connected with  Jordan [name]  on 02/02/22 at 1604 by video and verified that I am speaking with the correct person using two identifiers. Jordan [name] is currently located at home and his wife is currently with him during the visit. The provider, Gwenlyn Perking, FNP is located in their office at time of visit.  I discussed the limitations, risks, security and privacy concerns of performing an evaluation and management service by video  and the availability of in person appointments. I also discussed with the patient that there may be a patient responsible charge related to this service. The patient expressed understanding and agreed to proceed.  CC: Covid  History and Present Illness:  Jordan [name] reports a sore throat, fatigue, nasal congestion and headache that started today. He took a home Covid test that came back positive. He denies fever, chills, shortness of breath, or chest pain. He has not tried anything for his symptoms.    ROS As per HPI.   Observations/Objective: Alert and oriented x 3. Able to speak in full sentences without difficulty. Respirations unlabored, no cyanosis. Normal speech, mood, and behavior.   Assessment and Plan: [name] was seen today for covid positive.  Diagnoses and all orders for this visit:  COVID-19 Mild symptoms, but high risk patient. Multiple interactions with paxlovid. Discussed and order molnupiravir instead. Discussed symptomatic care and return precautions. Discussed when to seek emergency  care.  -     molnupiravir EUA (LAGEVRIO) 200 mg CAPS capsule; Take 4 capsules (800 mg total) by mouth 2 (two) times daily for 5 days.    Follow Up Instructions: As needed.     I discussed the assessment and treatment plan with the patient. The patient was provided an opportunity to ask questions and all were answered. The patient agreed with the plan and demonstrated an understanding of the instructions.   The patient was advised to call back or seek an in-person evaluation if the symptoms worsen or if the condition fails to improve as anticipated.  The above assessment and management plan was discussed with the patient. The patient verbalized understanding of and has agreed to the management plan. Patient is aware to call the clinic if symptoms persist or worsen. Patient is aware when to return to the clinic for a follow-up visit. Patient educated on when it is appropriate to go to the emergency department.   Time call ended: 1612  I provided 8 minutes of face-to-face time during this encounter.    Gwenlyn Perking, FNP

## 2022-02-14 ENCOUNTER — Ambulatory Visit: Payer: Medicare Other | Admitting: Internal Medicine

## 2022-02-14 ENCOUNTER — Encounter: Payer: Self-pay | Admitting: Internal Medicine

## 2022-02-14 ENCOUNTER — Telehealth: Payer: Self-pay

## 2022-02-14 VITALS — BP 118/70 | HR 76 | Ht 74.0 in | Wt 210.0 lb

## 2022-02-14 DIAGNOSIS — Z800 Family history of malignant neoplasm of digestive organs: Secondary | ICD-10-CM | POA: Diagnosis not present

## 2022-02-14 DIAGNOSIS — I480 Paroxysmal atrial fibrillation: Secondary | ICD-10-CM | POA: Diagnosis not present

## 2022-02-14 DIAGNOSIS — Z7901 Long term (current) use of anticoagulants: Secondary | ICD-10-CM | POA: Diagnosis not present

## 2022-02-14 DIAGNOSIS — Z86010 Personal history of colonic polyps: Secondary | ICD-10-CM

## 2022-02-14 NOTE — Patient Instructions (Signed)
You have been scheduled for a colonoscopy. Please follow written instructions given to you at your visit today.  Please pick up your prep supplies at the pharmacy within the next 1-3 days. If you use inhalers (even only as needed), please bring them with you on the day of your procedure.  You will be contaced by our office prior to your procedure for directions on holding your Eliquis.  If you do not hear from our office 1 week prior to your scheduled procedure, please call [phone] to discuss.   I appreciate the opportunity to care for you. Silvano Rusk, MD, [facility]

## 2022-02-14 NOTE — Telephone Encounter (Signed)
[facility] Pre-operative Risk Assessment     Request for surgical clearance:     Endoscopy Procedure  What type of surgery is being performed?     colonoscopy  When is this surgery scheduled?     03/28/2022  What type of clearance is required ?   Pharmacy  Are there any medications that need to be held prior to surgery and how long? Eliquis, 2 days prior  Practice name and name of physician performing surgery?      [facility]  What is your office phone and fax number?      Phone- [phone]  [phone]  Anesthesia type (None, local, MAC, general) ?       MAC

## 2022-02-14 NOTE — Progress Notes (Signed)
FREAD KOTTKE 76 y.o. 1945/07/13 [id number]  Assessment & Plan:   Encounter Diagnoses  Name Primary?   Personal history of colonic adenomas Yes   Family history of colon cancer - daughter    Long term (current) use of anticoagulants    Paroxysmal atrial fibrillation (HCC)-s/p ablation     Schedule surveillance colonoscopy.  This is at increased risk due to holding Eliquis.  Rare but real risk of stroke off anticoagulation (Eliquis) reviewed with the patient.  Typical colonoscopy risks also reviewed through our informed consent process (the patient has reviewed these).   Anticipating holding Eliquis 2 days prior to colonoscopy.  We will clarify with cardiology that this is acceptable risk-benefit ratio.   CC: [name], DO  Subjective:   Chief Complaint: History of colon polyps, schedule colonoscopy  HPI The patient is a 76 year old white man with a history of paroxysmal atrial fibrillation status post ablation earlier this year, on Eliquis, CAD, also with a history of colon polyps and a family history of colon cancer in his daughter.  He was seen by me in December 2022 regarding history of colon polyps but we deferred his procedure due to umbilical hernia repair and RFA of his A-fib that was coming up.  He returns now ready to schedule.  The RFA procedure was successful.  He is not having any active GI problems.  He does have a history of IBS.   Polyp history: 2006 - > 1 cm TV adenoma 2008 - 6 mm adenoma (cecum) 11/07/2012 6 right colon polyps largest 8 mm - adenomas  12/2015 6 mm adenoma Allergies  Allergen Reactions   Levofloxacin Other (See Comments)    Couldn't breathe, passed out   Lopid [Gemfibrozil] Other (See Comments)    Loose bowels   Sulfonamide Derivatives Hives   Sulfa Antibiotics Rash   Current Meds  Medication Sig   apixaban (ELIQUIS) 5 MG TABS tablet TAKE  (1)  TABLET TWICE A DAY.   atorvastatin (LIPITOR) 80 MG tablet TAKE 1 TABLET DAILY  AT 6PM   Calcium Carb-Cholecalciferol (CALCIUM 600+D3 PO) Take 1 capsule by mouth daily.   carboxymethylcellulose (REFRESH PLUS) 0.5 % SOLN Place 1 drop into both eyes 3 (three) times daily as needed. For dry eyes   Cholecalciferol (VITAMIN D3) 1000 UNITS CAPS Take 1,000 Units by mouth daily.   diltiazem (CARDIZEM CD) 180 MG 24 hr capsule Take 1 capsule (180 mg total) by mouth daily.   diltiazem (CARDIZEM) 60 MG tablet Take 1 tablet (60 mg total) by mouth as needed.   GLUCOSAMINE-CHONDROITIN PO Take 1,500 mg by mouth daily.   Multiple Vitamins-Minerals (MULTIVITAMIN WITH MINERALS) tablet Take 1 tablet by mouth daily.   nitroGLYCERIN (NITROSTAT) 0.4 MG SL tablet Place 1 tablet (0.4 mg total) under the tongue every 5 (five) minutes as needed for chest pain.   Probiotic Product (ALIGN PO) Take 1 tablet by mouth daily as needed.   triamcinolone cream (KENALOG) 0.1 % Apply 1 application topically 2 (two) times daily. (Patient taking differently: Apply 1 application  topically daily as needed (tic bites).)   Past Medical History:  Diagnosis Date   Anxiety    Mild   Atrial fibrillation (HCC)    Paroxysmal   BPH (benign prostatic hyperplasia)    CAD (coronary artery disease)    Stent LAD, 2002  /  nuclear 2009, no ischemia  /    [facility] October, 2011 nuclear, no ischemia, possible slight apical scar, ejection fraction 70%  Carotid bruit    Doppler November, 2011, normal   Chest pain 02/23/2013   Chronic anticoagulation    Low CHADS , score, but patient wants to be aggressive   Colon polyps    COVID-19    + long Covid problems   Drug intolerance    Mild beta blocker intolerance   Dyslipidemia    Ejection fraction    EF 65-70%, echo, 2007 /  EF 70%, nuclear, 2011   Hyperlipidemia    IBS (irritable bowel syndrome)    Incomplete RBBB    Knee pain    left   Personal history of colonic adenomas 11/29/2006   Qualifier: Diagnosis of  By: Danny Lawless CMA, [place]     Syncope    November, 2010    Past Surgical History:  Procedure Laterality Date   ATRIAL FIBRILLATION ABLATION N/A 10/25/2021   Procedure: ATRIAL FIBRILLATION ABLATION;  Surgeon: Constance Haw, MD;  Location: [facility];  Service: Cardiovascular;  Laterality: N/A;   BACK SURGERY  05/1990   [id number]   CARDIAC CATHETERIZATION     COLONOSCOPY  multiple   PROSTATE SURGERY     UMBILICAL HERNIA REPAIR  07/15/2021   [facility]   XI ROBOTIC ASSISTED SIMPLE PROSTATECTOMY  2022   Social History   Social History Narrative   Married, he has children and grandchildren.   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   Review of Systems See HPI  Objective:   Physical Exam '@BP'$  118/70   Pulse 76   Ht '6\' 2"'$  (1.88 m)   Wt 210 lb (95.3 kg)   SpO2 99%   BMI 26.96 kg/m @  General:  NAD Eyes:   anicteric Lungs:  clear Heart::  S1S2 no rubs, murmurs or gallops Abdomen:  soft and nontender, BS+ Ext:   no edema, cyanosis or clubbing    Data Reviewed:  See HPI

## 2022-02-15 NOTE — Telephone Encounter (Signed)
Dr. Curt Bears, you recently saw this patient on 01/23/2022. We have now received request for him to undergo colonoscopy. Do you agree that he is at low risk to proceed with procedure?  Please direct your response to p cv div preop.  Thank you, Emmaline Life, NP-C     02/15/2022, 7:56 AM [address], [address] Office [phone] Fax [phone]

## 2022-02-16 NOTE — Telephone Encounter (Signed)
Patient informed to hold his Eliquis 2 days prior to procedure. He verbalized understanding.

## 2022-02-17 NOTE — Telephone Encounter (Signed)
Patient with diagnosis of A Fib on Eliquis for anticoagulation. Had ablation 10/25/21.  Procedure: endoscopy Date of procedure: 03/28/22   CHA2DS2-VASc Score = 2  This indicates a 2.2% annual risk of stroke. The patient's score is based upon: CHF History: 0 HTN History: 0 Diabetes History: 0 Stroke History: 0 Vascular Disease History: 1 Age Score: 1 Gender Score: 0    CrCl 90 mL/min Platelet count 234K  Per office protocol, patient can hold Eliquis for 2 days prior to procedure.  .  **This guidance is not considered finalized until pre-operative APP has relayed final recommendations.**

## 2022-02-21 NOTE — Telephone Encounter (Signed)
   Primary Cardiologist: Candee Furbish, MD  Chart reviewed as part of pre-operative protocol coverage. Given past medical history and time since last visit, based on ACC/AHA guidelines, LUCAH PETTA would be at acceptable risk for the planned procedure without further cardiovascular testing.   He may hold Eliquis for 2 days prior to procedure.  I will route this recommendation to the requesting party via Epic fax function and remove from pre-op pool.  Please call with questions.  Emmaline Life, NP-C   02/21/2022, 8:52 AM [address], [address] Office [phone] Fax [phone]

## 2022-03-20 ENCOUNTER — Encounter: Payer: Self-pay | Admitting: Internal Medicine

## 2022-03-22 DIAGNOSIS — L859 Epidermal thickening, unspecified: Secondary | ICD-10-CM | POA: Diagnosis not present

## 2022-03-22 DIAGNOSIS — D1801 Hemangioma of skin and subcutaneous tissue: Secondary | ICD-10-CM | POA: Diagnosis not present

## 2022-03-22 DIAGNOSIS — Z85828 Personal history of other malignant neoplasm of skin: Secondary | ICD-10-CM | POA: Diagnosis not present

## 2022-03-22 DIAGNOSIS — D485 Neoplasm of uncertain behavior of skin: Secondary | ICD-10-CM | POA: Diagnosis not present

## 2022-03-22 DIAGNOSIS — D225 Melanocytic nevi of trunk: Secondary | ICD-10-CM | POA: Diagnosis not present

## 2022-03-22 DIAGNOSIS — L821 Other seborrheic keratosis: Secondary | ICD-10-CM | POA: Diagnosis not present

## 2022-03-22 DIAGNOSIS — D2271 Melanocytic nevi of right lower limb, including hip: Secondary | ICD-10-CM | POA: Diagnosis not present

## 2022-03-22 DIAGNOSIS — L812 Freckles: Secondary | ICD-10-CM | POA: Diagnosis not present

## 2022-03-22 DIAGNOSIS — L570 Actinic keratosis: Secondary | ICD-10-CM | POA: Diagnosis not present

## 2022-03-22 DIAGNOSIS — L820 Inflamed seborrheic keratosis: Secondary | ICD-10-CM | POA: Diagnosis not present

## 2022-03-23 DIAGNOSIS — G4733 Obstructive sleep apnea (adult) (pediatric): Secondary | ICD-10-CM | POA: Diagnosis not present

## 2022-03-28 ENCOUNTER — Ambulatory Visit (AMBULATORY_SURGERY_CENTER): Payer: Medicare Other | Admitting: Internal Medicine

## 2022-03-28 ENCOUNTER — Encounter: Payer: Self-pay | Admitting: Internal Medicine

## 2022-03-28 VITALS — BP 116/70 | HR 59 | Temp 97.3°F | Resp 17 | Ht 74.0 in | Wt 210.0 lb

## 2022-03-28 DIAGNOSIS — D120 Benign neoplasm of cecum: Secondary | ICD-10-CM

## 2022-03-28 DIAGNOSIS — G4733 Obstructive sleep apnea (adult) (pediatric): Secondary | ICD-10-CM | POA: Diagnosis not present

## 2022-03-28 DIAGNOSIS — Z09 Encounter for follow-up examination after completed treatment for conditions other than malignant neoplasm: Secondary | ICD-10-CM | POA: Diagnosis not present

## 2022-03-28 DIAGNOSIS — D123 Benign neoplasm of transverse colon: Secondary | ICD-10-CM

## 2022-03-28 DIAGNOSIS — I4891 Unspecified atrial fibrillation: Secondary | ICD-10-CM | POA: Diagnosis not present

## 2022-03-28 DIAGNOSIS — Z86010 Personal history of colonic polyps: Secondary | ICD-10-CM

## 2022-03-28 DIAGNOSIS — K635 Polyp of colon: Secondary | ICD-10-CM | POA: Diagnosis not present

## 2022-03-28 DIAGNOSIS — D124 Benign neoplasm of descending colon: Secondary | ICD-10-CM | POA: Diagnosis not present

## 2022-03-28 DIAGNOSIS — I2510 Atherosclerotic heart disease of native coronary artery without angina pectoris: Secondary | ICD-10-CM | POA: Diagnosis not present

## 2022-03-28 MED ORDER — SODIUM CHLORIDE 0.9 % IV SOLN: 500.0000 mL | INTRAVENOUS | Status: DC

## 2022-03-28 NOTE — Progress Notes (Signed)
Pt resting comfortably. VSS. Airway intact. SBAR complete to RN. All questions answered.   

## 2022-03-28 NOTE — Patient Instructions (Addendum)
   YOU HAD AN ENDOSCOPIC PROCEDURE TODAY AT [facility]:   Refer to the procedure report that was given to you for any specific questions about what was found during the examination.  If the procedure report does not answer your questions, please call your gastroenterologist to clarify.  If you requested that your care partner not be given the details of your procedure findings, then the procedure report has been included in a sealed envelope for you to review at your convenience later.  YOU SHOULD EXPECT: Some feelings of bloating in the abdomen. Passage of more gas than usual.  Walking can help get rid of the air that was put into your GI tract during the procedure and reduce the bloating. If you had a lower endoscopy (such as a colonoscopy or flexible sigmoidoscopy) you may notice spotting of blood in your stool or on the toilet paper. If you underwent a bowel prep for your procedure, you may not have a normal bowel movement for a few days.  Please Note:  You might notice some irritation and congestion in your nose or some drainage.  This is from the oxygen used during your procedure.  There is no need for concern and it should clear up in a day or so.  SYMPTOMS TO REPORT IMMEDIATELY:  Following lower endoscopy (colonoscopy or flexible sigmoidoscopy):  Excessive amounts of blood in the stool  Significant tenderness or worsening of abdominal pains  Swelling of the abdomen that is new, acute  Fever of 100F or higher   For urgent or emergent issues, a gastroenterologist can be reached at any hour by calling [phone]. Do not use MyChart messaging for urgent concerns.    DIET:  We do recommend a small meal at first, but then you may proceed to your regular diet.  Drink plenty of fluids but you should avoid alcoholic beverages for 24 hours.  ACTIVITY:  You should plan to take it easy for the rest of today and you should NOT DRIVE or use heavy machinery until tomorrow  (because of the sedation medicines used during the test).    FOLLOW UP: Our staff will call the number listed on your records the next business day following your procedure.  We will call around 7:15- 8:00 am to check on you and address any questions or concerns that you may have regarding the information given to you following your procedure. If we do not reach you, we will leave a message.     If any biopsies were taken you will be contacted by phone or by letter within the next 1-3 weeks.  Please call us at [phone] if you have not heard about the biopsies in 3 weeks.    SIGNATURES/CONFIDENTIALITY: You and/or your care partner have signed paperwork which will be entered into your electronic medical record.  These signatures attest to the fact that that the information above on your After Visit Summary has been reviewed and is understood.  Full responsibility of the confidentiality of this discharge information lies with you and/or your care-partner. I found and removed 4 polyps today.  I will let you know pathology results and when to have another routine colonoscopy by mail and/or My Chart.  Resume Eliquis tomorrow.  I appreciate the opportunity to care for you. Gatha Mayer, MD, [facility]   Handouts on polyps & diverticulosis given to you today  Await pathology results on polyps removed

## 2022-03-28 NOTE — Op Note (Addendum)
[facility] Patient Name: Jordan [name] Procedure Date: 03/28/2022 2:47 PM MRN: [id number] Endoscopist: Gatha Mayer , MD Age: 76 Referring MD:  Date of Birth: 1945/11/22 Gender: Male Account #: [id number] Procedure:                Colonoscopy Indications:              Surveillance: Personal history of adenomatous                            polyps on last colonoscopy > 5 years ago, Last                            colonoscopy: 2017 Medicines:                Monitored Anesthesia Care Procedure:                Pre-Anesthesia Assessment:                           - Prior to the procedure, a History and Physical                            was performed, and patient medications and                            allergies were reviewed. The patient's tolerance of                            previous anesthesia was also reviewed. The risks                            and benefits of the procedure and the sedation                            options and risks were discussed with the patient.                            All questions were answered, and informed consent                            was obtained. Prior Anticoagulants: The patient                            last took Eliquis (apixaban) 2 days prior to the                            procedure. ASA Grade Assessment: III - A patient                            with severe systemic disease. After reviewing the                            risks and benefits, the patient was deemed in  satisfactory condition to undergo the procedure.                           After obtaining informed consent, the colonoscope                            was passed under direct vision. Throughout the                            procedure, the patient's blood pressure, pulse, and                            oxygen saturations were monitored continuously. The                            CF [id number] #[id number] was introduced through the  anus                            and advanced to the the cecum, identified by                            appendiceal orifice and ileocecal valve. The                            colonoscopy was somewhat difficult due to a                            redundant colon. Successful completion of the                            procedure was aided by applying abdominal pressure.                            The patient tolerated the procedure well. The                            quality of the bowel preparation was adequate. The                            bowel preparation used was Miralax via split dose                            instruction. The appendiceal orifice and the rectum                            were photographed. Scope In: 3:08:04 PM Scope Out: 3:36:35 PM Scope Withdrawal Time: 0 hours 19 minutes 33 seconds  Total Procedure Duration: 0 hours 28 minutes 31 seconds  Findings:                 The perianal and digital rectal examinations were                            normal.  Four sessile polyps were found in the descending                            colon, transverse colon and cecum. The polyps were                            1 to 12 mm in size. These polyps were removed with                            a cold snare. Resection and retrieval were                            complete. Verification of patient identification                            for the specimen was done. Estimated blood loss was                            minimal.                           Multiple diverticula were found in the sigmoid                            colon.                           The exam was otherwise without abnormality on                            direct and retroflexion views. Complications:            No immediate complications. Estimated Blood Loss:     Estimated blood loss was minimal. Impression:               - Four 1 to 12 mm polyps in the descending colon,                             in the transverse colon and in the cecum, removed                            with a cold snare. Resected and retrieved.                           - Diverticulosis in the sigmoid colon.                           - Personal history of colonic polyps.2006 - > 1 cm                            TV adenoma                           2008 - 6 mm adenoma (cecum)  11/07/2012 6 right colon polyps largest 8 mm -                            adenomas                           12/2015 6 mm adenoma                           FHx CRCA daughter Recommendation:           - Patient has a contact number available for                            emergencies. The signs and symptoms of potential                            delayed complications were discussed with the                            patient. Return to normal activities tomorrow.                            Written discharge instructions were provided to the                            patient.                           - Resume previous diet.                           - Continue present medications.                           - Resume Eliquis (apixaban) at prior dose tomorrow.                           - Repeat colonoscopy may be recommended for                            surveillance. The colonoscopy date will be                            determined after pathology results from today's                            exam become available for review. Would use extra                            vs different prep Gatha Mayer, MD 03/28/2022 3:44:34 PM This report has been signed electronically.

## 2022-03-28 NOTE — Progress Notes (Signed)
[facility] History and Physical   Primary Care Physician:  [name], DO   Reason for Procedure:   Hx colon polypos  Plan:    colonoscopy     HPI: Jordan [name] is a 76 y.o. male  with a history of paroxysmal atrial fibrillation status post ablation earlier this year, on Eliquis, CAD, also with a history of colon polyps and a family history of colon cancer in his daughter.  He was seen by me in December 2022 regarding history of colon polyps but we deferred his procedure due to umbilical hernia repair and RFA of his A-fib that was coming up.   The RFA procedure was successful.  He is not having any active GI problems.  He does have a history of IBS.     Polyp history: 2006 - > 1 cm TV adenoma 2008 - 6 mm adenoma (cecum) 11/07/2012 6 right colon polyps largest 8 mm - adenomas  12/2015 6 mm adenoma    Past Medical History:  Diagnosis Date   Anxiety    Mild   Atrial fibrillation (HCC)    Paroxysmal   BPH (benign prostatic hyperplasia)    CAD (coronary artery disease)    Stent LAD, 2002  /  nuclear 2009, no ischemia  /    [facility] October, 2011 nuclear, no ischemia, possible slight apical scar, ejection fraction 70%   Carotid bruit    Doppler November, 2011, normal   Chest pain 02/23/2013   Chronic anticoagulation    Low CHADS , score, but patient wants to be aggressive   Colon polyps    COVID-19    + long Covid problems   Drug intolerance    Mild beta blocker intolerance   Dyslipidemia    Ejection fraction    EF 65-70%, echo, 2007 /  EF 70%, nuclear, 2011   Hyperlipidemia    IBS (irritable bowel syndrome)    Incomplete RBBB    Knee pain    left   Personal history of colonic adenomas 11/29/2006   Qualifier: Diagnosis of  By: Danny Lawless CMA, [place]     Syncope    November, 2010    Past Surgical History:  Procedure Laterality Date   ATRIAL FIBRILLATION ABLATION N/A 10/25/2021   Procedure: ATRIAL FIBRILLATION ABLATION;  Surgeon: Constance Haw, MD;  Location: [facility];  Service: Cardiovascular;  Laterality: N/A;   BACK SURGERY  05/1990   [id number]   CARDIAC CATHETERIZATION     COLONOSCOPY  multiple   PROSTATE SURGERY     UMBILICAL HERNIA REPAIR  07/15/2021   [facility]   XI ROBOTIC ASSISTED SIMPLE PROSTATECTOMY  2022    Prior to Admission medications   Medication Sig Start Date End Date Taking? Authorizing Provider  apixaban (ELIQUIS) 5 MG TABS tablet TAKE  (1)  TABLET TWICE A DAY. 10/18/21  Yes Gottschalk, Ashly M, DO  atorvastatin (LIPITOR) 80 MG tablet TAKE 1 TABLET DAILY AT 6PM 10/18/21  Yes Gottschalk, Ashly M, DO  Calcium Carb-Cholecalciferol (CALCIUM 600+D3 PO) Take 1 capsule by mouth daily.   Yes [provider]  Cholecalciferol (VITAMIN D3) 1000 UNITS CAPS Take 1,000 Units by mouth daily.   Yes [provider]  diltiazem (CARDIZEM CD) 180 MG 24 hr capsule Take 1 capsule (180 mg total) by mouth daily. 10/18/21  Yes Gottschalk, Ashly M, DO  GLUCOSAMINE-CHONDROITIN PO Take 1,500 mg by mouth daily.   Yes [provider]  Multiple Vitamins-Minerals (MULTIVITAMIN WITH MINERALS) tablet  Take 1 tablet by mouth daily.   Yes [provider]  Probiotic Product (ALIGN PO) Take 1 tablet by mouth daily as needed.   Yes [provider]  carboxymethylcellulose (REFRESH PLUS) 0.5 % SOLN Place 1 drop into both eyes 3 (three) times daily as needed. For dry eyes    [provider]  diltiazem (CARDIZEM) 60 MG tablet Take 1 tablet (60 mg total) by mouth as needed. 10/18/21   [name], DO  nitroGLYCERIN (NITROSTAT) 0.4 MG SL tablet Place 1 tablet (0.4 mg total) under the tongue every 5 (five) minutes as needed for chest pain. 10/18/21   [name], DO  triamcinolone cream (KENALOG) 0.1 % Apply 1 application topically 2 (two) times daily. Patient taking differently: Apply 1 application  topically daily as needed (tic bites). 01/18/21   Baruch Gouty, FNP    Current  Outpatient Medications  Medication Sig Dispense Refill   apixaban (ELIQUIS) 5 MG TABS tablet TAKE  (1)  TABLET TWICE A DAY. 180 tablet 3   atorvastatin (LIPITOR) 80 MG tablet TAKE 1 TABLET DAILY AT 6PM 90 tablet 3   Calcium Carb-Cholecalciferol (CALCIUM 600+D3 PO) Take 1 capsule by mouth daily.     Cholecalciferol (VITAMIN D3) 1000 UNITS CAPS Take 1,000 Units by mouth daily.     diltiazem (CARDIZEM CD) 180 MG 24 hr capsule Take 1 capsule (180 mg total) by mouth daily. 90 capsule 3   GLUCOSAMINE-CHONDROITIN PO Take 1,500 mg by mouth daily.     Multiple Vitamins-Minerals (MULTIVITAMIN WITH MINERALS) tablet Take 1 tablet by mouth daily.     Probiotic Product (ALIGN PO) Take 1 tablet by mouth daily as needed.     carboxymethylcellulose (REFRESH PLUS) 0.5 % SOLN Place 1 drop into both eyes 3 (three) times daily as needed. For dry eyes     diltiazem (CARDIZEM) 60 MG tablet Take 1 tablet (60 mg total) by mouth as needed. 90 tablet 1   nitroGLYCERIN (NITROSTAT) 0.4 MG SL tablet Place 1 tablet (0.4 mg total) under the tongue every 5 (five) minutes as needed for chest pain. 25 tablet 3   triamcinolone cream (KENALOG) 0.1 % Apply 1 application topically 2 (two) times daily. (Patient taking differently: Apply 1 application  topically daily as needed (tic bites).) 30 g 0   Current Facility-Administered Medications  Medication Dose Route Frequency Provider Last Rate Last Admin   0.9 %  sodium chloride infusion  500 mL Intravenous Continuous Gatha Mayer, MD        Allergies as of 03/28/2022 - Review Complete 03/28/2022  Allergen Reaction Noted   Levofloxacin Other (See Comments)    Lopid [gemfibrozil] Other (See Comments) 10/11/2012   Sulfonamide derivatives Hives    Sulfa antibiotics Rash 07/15/2021    Family History  Problem Relation Age of Onset   Heart attack Mother    Diabetes Mother    Heart attack Brother    Heart disease Brother    Stroke Maternal Grandfather    Stroke Paternal  Grandfather    Macular degeneration Sister    Cancer Daughter        colon    Obesity Sister    Paranoid behavior Sister    Cancer Sister        lung - uterus    COPD Sister    Colon cancer Neg Hx     Social History   Socioeconomic History   Marital status: Married    Spouse name: [name]  Number of children: 3   Years of education: bachelors   Highest education level: Not on file  Occupational History   Occupation: retired    Comment: Scientist, 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   [place] [address] [place], raises chickens, retired Neurosurgeon    Social Determinants of [place] Strain: [place]  (11/21/2021)   Overall Financial Resource Strain (CARDIA)    Difficulty of Paying Living Expenses: Not hard at all  Food Insecurity: No Food Insecurity (11/21/2021)   Hunger Vital Sign    Worried About Running Out of Food in the Last Year: Never true    [place] in the Last Year: Never true  Transportation Needs: No Transportation Needs (11/21/2021)   PRAPARE - Hydrologist (Medical): No    Lack of Transportation (Non-Medical): No  Physical Activity: Sufficiently Active (11/21/2021)   Exercise Vital Sign    Days of Exercise per Week: 7 days    Minutes of Exercise per Session: 60 min  Stress: No Stress Concern Present (11/21/2021)   [place]    Feeling of Stress : Not at all  Social Connections: [place] (11/21/2021)   Social Connection and Isolation Panel [NHANES]    Frequency of Communication with Friends and Family: More than three times a week     Frequency of Social Gatherings with Friends and Family: More than three times a week    Attends Religious Services: More than 4 times per year    Active Member of [employer] or Organizations: Yes    Attends Music therapist: More than 4 times per year    Marital Status: Married  Human resources officer Violence: Not At Risk (11/21/2021)   Humiliation, Afraid, Rape, and Kick questionnaire    Fear of Current or Ex-Partner: No    Emotionally Abused: No    Physically Abused: No    Sexually Abused: No    Review of Systems:  All other review of systems negative except as mentioned in the HPI.  Physical Exam: Vital signs BP 113/71   Pulse 67   Temp (!) 97.3 F (36.3 C)   Ht '6\' 2"'$  (1.88 m)   Wt 210 lb (95.3 kg)   SpO2 99%   BMI 26.96 kg/m   General:   Alert,  Well-developed, well-nourished, pleasant and cooperative in NAD Lungs:  Clear throughout to auscultation.   Heart:  Regular rate; no murmurs, clicks, rubs,  or gallops. Abdomen:  Soft, nontender and nondistended. Normal bowel sounds.   Neuro/Psych:  Alert and cooperative. Normal mood and affect. A and O x 3   '@Ladamien Rammel'$  Simonne Maffucci, MD, [facility] [phone] (pager) 03/28/2022 2:57 PM@

## 2022-03-28 NOTE — Progress Notes (Signed)
Pt's states no medical or surgical changes since previsit or office visit. 

## 2022-03-28 NOTE — Progress Notes (Signed)
Called to room to assist during endoscopic procedure.  Patient ID and intended procedure confirmed with present staff. Received instructions for my participation in the procedure from the performing physician.  

## 2022-03-29 ENCOUNTER — Telehealth: Payer: Self-pay | Admitting: *Deleted

## 2022-03-29 NOTE — Telephone Encounter (Signed)
  Follow up Call-     03/28/2022    2:27 PM  Call back number  Post procedure Call Back phone  # [phone]  Permission to leave phone message Yes     Patient questions:  Do you have a fever, pain , or abdominal swelling? No. Pain Score  0 *  Have you tolerated food without any problems? Yes.    Have you been able to return to your normal activities? Yes.    Do you have any questions about your discharge instructions: Diet   No. Medications  No. Follow up visit  No.  Do you have questions or concerns about your Care? No.  Actions: * If pain score is 4 or above: No action needed, pain <4.

## 2022-04-04 ENCOUNTER — Encounter: Payer: Self-pay | Admitting: Internal Medicine

## 2022-04-04 DIAGNOSIS — Z86010 Personal history of colonic polyps: Secondary | ICD-10-CM

## 2022-04-10 DIAGNOSIS — M9903 Segmental and somatic dysfunction of lumbar region: Secondary | ICD-10-CM | POA: Diagnosis not present

## 2022-04-10 DIAGNOSIS — S338XXA Sprain of other parts of lumbar spine and pelvis, initial encounter: Secondary | ICD-10-CM | POA: Diagnosis not present

## 2022-04-10 DIAGNOSIS — S233XXA Sprain of ligaments of thoracic spine, initial encounter: Secondary | ICD-10-CM | POA: Diagnosis not present

## 2022-04-10 DIAGNOSIS — M9901 Segmental and somatic dysfunction of cervical region: Secondary | ICD-10-CM | POA: Diagnosis not present

## 2022-04-10 DIAGNOSIS — M47812 Spondylosis without myelopathy or radiculopathy, cervical region: Secondary | ICD-10-CM | POA: Diagnosis not present

## 2022-04-10 DIAGNOSIS — M9902 Segmental and somatic dysfunction of thoracic region: Secondary | ICD-10-CM | POA: Diagnosis not present

## 2022-04-10 DIAGNOSIS — R42 Dizziness and giddiness: Secondary | ICD-10-CM | POA: Diagnosis not present

## 2022-04-18 ENCOUNTER — Encounter: Payer: Self-pay | Admitting: Family Medicine

## 2022-04-18 ENCOUNTER — Ambulatory Visit (INDEPENDENT_AMBULATORY_CARE_PROVIDER_SITE_OTHER): Payer: Medicare Other | Admitting: Family Medicine

## 2022-04-18 VITALS — BP 112/68 | HR 68 | Temp 98.1°F | Ht 74.0 in | Wt 213.2 lb

## 2022-04-18 DIAGNOSIS — G4733 Obstructive sleep apnea (adult) (pediatric): Secondary | ICD-10-CM

## 2022-04-18 DIAGNOSIS — E782 Mixed hyperlipidemia: Secondary | ICD-10-CM | POA: Diagnosis not present

## 2022-04-18 DIAGNOSIS — Z0001 Encounter for general adult medical examination with abnormal findings: Secondary | ICD-10-CM

## 2022-04-18 DIAGNOSIS — Z7901 Long term (current) use of anticoagulants: Secondary | ICD-10-CM | POA: Diagnosis not present

## 2022-04-18 DIAGNOSIS — I480 Paroxysmal atrial fibrillation: Secondary | ICD-10-CM | POA: Diagnosis not present

## 2022-04-18 DIAGNOSIS — Z0000 Encounter for general adult medical examination without abnormal findings: Secondary | ICD-10-CM

## 2022-04-18 DIAGNOSIS — Z6379 Other stressful life events affecting family and household: Secondary | ICD-10-CM

## 2022-04-18 LAB — CBC
Hematocrit: 42.1 % (ref 37.5–51.0)
Hemoglobin: 14.1 g/dL (ref 13.0–17.7)
MCH: 29.8 pg (ref 26.6–33.0)
MCHC: 33.5 g/dL (ref 31.5–35.7)
MCV: 89 fL (ref 79–97)
Platelets: 218 10*3/uL (ref 150–450)
RBC: 4.73 x10E6/uL (ref 4.14–5.80)
RDW: 12.4 % (ref 11.6–15.4)
WBC: 6.8 10*3/uL (ref 3.4–10.8)

## 2022-04-18 NOTE — Patient Instructions (Signed)
Preventive Care 65 Years and Older, Male Preventive care refers to lifestyle choices and visits with your health care provider that can promote health and wellness. Preventive care visits are also called wellness exams. What can I expect for my preventive care visit? Counseling During your preventive care visit, your health care provider may ask about your: Medical history, including: Past medical problems. Family medical history. History of falls. Current health, including: Emotional well-being. Home life and relationship well-being. Sexual activity. Memory and ability to understand (cognition). Lifestyle, including: Alcohol, nicotine or tobacco, and drug use. Access to firearms. Diet, exercise, and sleep habits. Work and work environment. Sunscreen use. Safety issues such as seatbelt and bike helmet use. Physical exam Your health care provider will check your: Height and weight. These may be used to calculate your BMI (body mass index). BMI is a measurement that tells if you are at a healthy weight. Waist circumference. This measures the distance around your waistline. This measurement also tells if you are at a healthy weight and may help predict your risk of certain diseases, such as type 2 diabetes and high blood pressure. Heart rate and blood pressure. Body temperature. Skin for abnormal spots. What immunizations do I need?  Vaccines are usually given at various ages, according to a schedule. Your health care provider will recommend vaccines for you based on your age, medical history, and lifestyle or other factors, such as travel or where you work. What tests do I need? Screening Your health care provider may recommend screening tests for certain conditions. This may include: Lipid and cholesterol levels. Diabetes screening. This is done by checking your blood sugar (glucose) after you have not eaten for a while (fasting). Hepatitis C test. Hepatitis B test. HIV (human  immunodeficiency virus) test. STI (sexually transmitted infection) testing, if you are at risk. Lung cancer screening. Colorectal cancer screening. Prostate cancer screening. Abdominal aortic aneurysm (AAA) screening. You may need this if you are a current or former smoker. Talk with your health care provider about your test results, treatment options, and if necessary, the need for more tests. Follow these instructions at home: Eating and drinking  Eat a diet that includes fresh fruits and vegetables, whole grains, lean protein, and low-fat dairy products. Limit your intake of foods with high amounts of sugar, saturated fats, and salt. Take vitamin and mineral supplements as recommended by your health care provider. Do not drink alcohol if your health care provider tells you not to drink. If you drink alcohol: Limit how much you have to 0-2 drinks a day. Know how much alcohol is in your drink. In the [place] one drink equals one 12 oz bottle of beer (355 mL), one 5 oz glass of wine (148 mL), or one 1 oz glass of hard liquor (44 mL). Lifestyle Brush your teeth every morning and night with fluoride toothpaste. Floss one time each day. Exercise for at least 30 minutes 5 or more days each week. Do not use any products that contain nicotine or tobacco. These products include cigarettes, chewing tobacco, and vaping devices, such as e-cigarettes. If you need help quitting, ask your health care provider. Do not use drugs. If you are sexually active, practice safe sex. Use a condom or other form of protection to prevent STIs. Take aspirin only as told by your health care provider. Make sure that you understand how much to take and what form to take. Work with your health care provider to find out whether it is safe   and beneficial for you to take aspirin daily. Ask your health care provider if you need to take a cholesterol-lowering medicine (statin). Find healthy ways to manage stress, such  as: Meditation, yoga, or listening to music. Journaling. Talking to a trusted person. Spending time with friends and family. Safety Always wear your seat belt while driving or riding in a vehicle. Do not drive: If you have been drinking alcohol. Do not ride with someone who has been drinking. When you are tired or distracted. While texting. If you have been using any mind-altering substances or drugs. Wear a helmet and other protective equipment during sports activities. If you have firearms in your house, make sure you follow all gun safety procedures. Minimize exposure to UV radiation to reduce your risk of skin cancer. What's next? Visit your health care provider once a year for an annual wellness visit. Ask your health care provider how often you should have your eyes and teeth checked. Stay up to date on all vaccines. This information is not intended to replace advice given to you by your health care provider. Make sure you discuss any questions you have with your health care provider. Document Revised: 11/24/2020 Document Reviewed: 11/24/2020 Elsevier Patient Education  2023 [employer]  

## 2022-04-18 NOTE — Progress Notes (Signed)
Jordan [name] is a 76 y.o. male presents to office today for annual physical exam examination.    Concerns today include: 1.  He is somewhat worried about his wife's health.  He also notes that his brother passed away suddenly from a myocardial infarction in September.  He has subsequently taken over the care of his sister, who resides in [facility] assisted living/nursing home facility.  She suffers from dementia and schizophrenia.  She also has a son, whom he cares for that suffers from mental impairment.  Overall he feels to be in fairly good health.  He continues to exercise regularly.  He is compliant with all of his medications.  Occasionally gets a little flareup of his back issues but often this is not interfere with his ability to exercise and get around.  Occupation: Retired, Marital status: Married to [place], Substance use: None Diet: Balanced, Exercise: Regular at lifestyle gym Last eye exam: Up-to-date Last dental exam: Up-to-date Last colonoscopy: Up-to-date Refills needed today: None Immunizations needed: Immunization History  Administered Date(s) Administered   COVID-19, mRNA, vaccine(Comirnaty)12 years and older 04/11/2022   Influenza,inj,Quad PF,6+ Mos 02/24/2013, 03/28/2014, 03/26/2015, 03/10/2017, 03/26/2018   Influenza-Unspecified 03/25/2016, 03/26/2018, 03/13/2019   PFIZER(Purple Top)SARS-COV-2 Vaccination 07/08/2019, 07/29/2019, 03/11/2020, 09/17/2020   Pneumococcal Conjugate-13 11/07/2013   Pneumococcal Polysaccharide-23 10/16/2012   Tdap 11/28/2016   Zoster Recombinat (Shingrix) 10/11/2017, 12/10/2017     Past Medical History:  Diagnosis Date   Anxiety    Mild   Atrial fibrillation (HCC)    Paroxysmal   BPH (benign prostatic hyperplasia)    CAD (coronary artery disease)    Stent LAD, 2002  /  nuclear 2009, no ischemia  /    Hospital October, 2011 nuclear, no ischemia, possible slight apical scar, ejection fraction 70%   Carotid bruit    Doppler  November, 2011, normal   Chest pain 02/23/2013   Chronic anticoagulation    Low CHADS , score, but patient wants to be aggressive   Colon polyps    COVID-19    + long Covid problems   Drug intolerance    Mild beta blocker intolerance   Dyslipidemia    Ejection fraction    EF 65-70%, echo, 2007 /  EF 70%, nuclear, 2011   Hyperlipidemia    IBS (irritable bowel syndrome)    Incomplete RBBB    Knee pain    left   Personal history of colonic adenomas 11/29/2006   Qualifier: Diagnosis of  By: Danny Lawless CMA, [place]     Syncope    November, 2010   Social History   Socioeconomic History   Marital status: Married    Spouse name: [name]    Number of children: 3   Years of education: bachelors   Highest education level: Not on file  Occupational History   Occupation: retired    Comment: Scientist, 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   [place] acres, raises chickens, retired Neurosurgeon    Social Determinants of [place] Strain: [place]  (11/21/2021)   Overall Financial Resource Strain (CARDIA)    Difficulty of Paying Living Expenses:  Not hard at all  Food Insecurity: No Food Insecurity (11/21/2021)   Hunger Vital Sign    Worried About Running Out of Food in the Last Year: Never true    Ran Out of Food in the Last Year: Never true  Transportation Needs: No Transportation Needs (11/21/2021)   PRAPARE - Hydrologist (Medical): No    Lack of Transportation (Non-Medical): No  Physical Activity: Sufficiently Active (11/21/2021)   Exercise Vital Sign    Days of Exercise per Week: 7 days    Minutes of Exercise per Session: 60 min  Stress:  No Stress Concern Present (11/21/2021)   [place]    Feeling of Stress : Not at all  Social Connections: [place] (11/21/2021)   Social Connection and Isolation Panel [NHANES]    Frequency of Communication with Friends and Family: More than three times a week    Frequency of Social Gatherings with Friends and Family: More than three times a week    Attends Religious Services: More than 4 times per year    Active Member of [employer] or Organizations: Yes    Attends Archivist Meetings: More than 4 times per year    Marital Status: Married  Human resources officer Violence: Not At Risk (11/21/2021)   Humiliation, Afraid, Rape, and Kick questionnaire    Fear of Current or Ex-Partner: No    Emotionally Abused: No    Physically Abused: No    Sexually Abused: No   Past Surgical History:  Procedure Laterality Date   ATRIAL FIBRILLATION ABLATION N/A 10/25/2021   Procedure: ATRIAL FIBRILLATION ABLATION;  Surgeon: Constance Haw, MD;  Location: [facility];  Service: Cardiovascular;  Laterality: N/A;   BACK SURGERY  05/1990   [id number]   CARDIAC CATHETERIZATION     COLONOSCOPY  multiple   PROSTATE SURGERY     UMBILICAL HERNIA REPAIR  07/15/2021   [facility]   XI ROBOTIC ASSISTED SIMPLE PROSTATECTOMY  2022   Family History  Problem Relation Age of Onset   Heart attack Mother    Diabetes Mother    Heart attack Brother    Heart disease Brother    Stroke Maternal Grandfather    Stroke Paternal Grandfather    Macular degeneration Sister    Cancer Daughter        colon    Obesity Sister    Paranoid behavior Sister    Cancer Sister        lung - uterus    COPD Sister    Colon cancer Neg Hx     Current Outpatient Medications:    apixaban (ELIQUIS) 5 MG TABS tablet, TAKE  (1)  TABLET TWICE A DAY., Disp: 180 tablet, Rfl: 3   atorvastatin (LIPITOR) 80 MG tablet, TAKE 1 TABLET DAILY AT 6PM, Disp: 90 tablet,  Rfl: 3   Calcium Carb-Cholecalciferol (CALCIUM 600+D3 PO), Take 1 capsule by mouth daily., Disp: , Rfl:    carboxymethylcellulose (REFRESH PLUS) 0.5 % SOLN, Place 1 drop into both eyes 3 (three) times daily as needed. For dry eyes, Disp: , Rfl:    Cholecalciferol (VITAMIN D3) 1000 UNITS CAPS, Take 1,000 Units by mouth daily., Disp: , Rfl:    diltiazem (CARDIZEM CD) 180 MG 24 hr capsule, Take 1 capsule (180 mg total) by mouth daily., Disp: 90 capsule, Rfl: 3   diltiazem (CARDIZEM) 60 MG tablet, Take 1 tablet (60 mg total) by mouth as  needed., Disp: 90 tablet, Rfl: 1   GLUCOSAMINE-CHONDROITIN PO, Take 1,500 mg by mouth daily., Disp: , Rfl:    Multiple Vitamins-Minerals (MULTIVITAMIN WITH MINERALS) tablet, Take 1 tablet by mouth daily., Disp: , Rfl:    nitroGLYCERIN (NITROSTAT) 0.4 MG SL tablet, Place 1 tablet (0.4 mg total) under the tongue every 5 (five) minutes as needed for chest pain., Disp: 25 tablet, Rfl: 3   Probiotic Product (ALIGN PO), Take 1 tablet by mouth daily as needed., Disp: , Rfl:    triamcinolone cream (KENALOG) 0.1 %, Apply 1 application topically 2 (two) times daily. (Patient taking differently: Apply 1 application  topically daily as needed (tic bites).), Disp: 30 g, Rfl: 0  Allergies  Allergen Reactions   Levofloxacin Other (See Comments)    Couldn't breathe, passed out   Lopid [Gemfibrozil] Other (See Comments)    Loose bowels   Sulfonamide Derivatives Hives   Sulfa Antibiotics Rash     ROS: Review of Systems Pertinent items noted in HPI and remainder of comprehensive ROS otherwise negative.    Physical exam BP 112/68   Pulse 68   Temp 98.1 F (36.7 C)   Ht '6\' 2"'$  (1.88 m)   Wt 213 lb 3.2 oz (96.7 kg)   SpO2 98%   BMI 27.37 kg/m  General appearance: alert, cooperative, appears stated age, and no distress Head: Normocephalic, without obvious abnormality, atraumatic Eyes: negative findings: lids and lashes normal, conjunctivae and sclerae normal, corneas clear,  and pupils equal, round, reactive to light and accomodation Ears: normal TM's and external ear canals both ears Nose: Nares normal. Septum midline. Mucosa normal. No drainage or sinus tenderness. Throat: lips, mucosa, and tongue normal; teeth and gums normal Neck: no adenopathy, supple, symmetrical, trachea midline, and thyroid not enlarged, symmetric, no tenderness/mass/nodules Back: symmetric, no curvature. ROM normal. No CVA tenderness. Lungs: clear to auscultation bilaterally Chest wall: no tenderness Heart: regular rate and rhythm, S1, S2 normal, no murmur, click, rub or gallop Abdomen: soft, non-tender; bowel sounds normal; no masses,  no organomegaly Extremities: extremities normal, atraumatic, no cyanosis or edema Pulses: 2+ and symmetric Skin: Skin color, texture, turgor normal. No rashes or lesions Lymph nodes: Cervical, supraclavicular, and axillary nodes normal. Neurologic: Grossly normal Psych: Mood somewhat depressed.  He is very pleasant, interactive.  Thought process is linear     04/18/2022    9:17 AM 01/17/2022    2:27 PM 11/21/2021    2:50 PM  Depression screen PHQ 2/9  Decreased Interest 0 0 0  Down, Depressed, Hopeless 0 0 0  PHQ - 2 Score 0 0 0      04/18/2022    9:17 AM 01/17/2022    2:28 PM 10/18/2021    8:03 AM 04/20/2021    8:25 AM  GAD 7 : Generalized Anxiety Score  Nervous, Anxious, on Edge 0 0 0 0  Control/stop worrying 0 0 0 0  Worry too much - different things 0 0 0 0  Trouble relaxing 0 0 0 0  Restless 0 0 0 0  Easily annoyed or irritable 0 0 0 1  Afraid - awful might happen 0 0 0 0  Total GAD 7 Score 0 0 0 1  Anxiety Difficulty Not difficult at all Not difficult at all Not difficult at all Not difficult at all     Assessment/ Plan: [name] here for annual physical exam.   Annual physical exam  Paroxysmal atrial fibrillation ([place])  Chronic anticoagulation - Plan: CBC  Mixed hyperlipidemia  OSA on CPAP  Stress due to illness of  family member  Rate and rhythm controlled.  Check CBC given chronic anticoagulation.  Up-to-date on preventative health care.  Not yet due for fasting labs.  Plan to check these in 6 months.  He is compliant with his CPAP and does very well with this.  Continue to use this as directed nightly  Self coping with stress due to illness of his wife.  Hopefully they will have some answers soon and a situational issue will resolve without any intervention  Follow-up in 6 months, sooner if concerns arise  Nazareth Norenberg M. Lajuana Ripple, DO

## 2022-05-17 DIAGNOSIS — S338XXA Sprain of other parts of lumbar spine and pelvis, initial encounter: Secondary | ICD-10-CM | POA: Diagnosis not present

## 2022-05-17 DIAGNOSIS — S233XXA Sprain of ligaments of thoracic spine, initial encounter: Secondary | ICD-10-CM | POA: Diagnosis not present

## 2022-05-17 DIAGNOSIS — R42 Dizziness and giddiness: Secondary | ICD-10-CM | POA: Diagnosis not present

## 2022-05-17 DIAGNOSIS — M9902 Segmental and somatic dysfunction of thoracic region: Secondary | ICD-10-CM | POA: Diagnosis not present

## 2022-05-17 DIAGNOSIS — M9903 Segmental and somatic dysfunction of lumbar region: Secondary | ICD-10-CM | POA: Diagnosis not present

## 2022-05-17 DIAGNOSIS — M47812 Spondylosis without myelopathy or radiculopathy, cervical region: Secondary | ICD-10-CM | POA: Diagnosis not present

## 2022-05-17 DIAGNOSIS — M9901 Segmental and somatic dysfunction of cervical region: Secondary | ICD-10-CM | POA: Diagnosis not present

## 2022-05-18 DIAGNOSIS — M9902 Segmental and somatic dysfunction of thoracic region: Secondary | ICD-10-CM | POA: Diagnosis not present

## 2022-05-18 DIAGNOSIS — R42 Dizziness and giddiness: Secondary | ICD-10-CM | POA: Diagnosis not present

## 2022-05-18 DIAGNOSIS — M9903 Segmental and somatic dysfunction of lumbar region: Secondary | ICD-10-CM | POA: Diagnosis not present

## 2022-05-18 DIAGNOSIS — M9901 Segmental and somatic dysfunction of cervical region: Secondary | ICD-10-CM | POA: Diagnosis not present

## 2022-05-18 DIAGNOSIS — M47812 Spondylosis without myelopathy or radiculopathy, cervical region: Secondary | ICD-10-CM | POA: Diagnosis not present

## 2022-05-22 DIAGNOSIS — M47812 Spondylosis without myelopathy or radiculopathy, cervical region: Secondary | ICD-10-CM | POA: Diagnosis not present

## 2022-05-22 DIAGNOSIS — M9903 Segmental and somatic dysfunction of lumbar region: Secondary | ICD-10-CM | POA: Diagnosis not present

## 2022-05-22 DIAGNOSIS — R42 Dizziness and giddiness: Secondary | ICD-10-CM | POA: Diagnosis not present

## 2022-05-22 DIAGNOSIS — M9901 Segmental and somatic dysfunction of cervical region: Secondary | ICD-10-CM | POA: Diagnosis not present

## 2022-05-22 DIAGNOSIS — M9902 Segmental and somatic dysfunction of thoracic region: Secondary | ICD-10-CM | POA: Diagnosis not present

## 2022-05-25 DIAGNOSIS — Z08 Encounter for follow-up examination after completed treatment for malignant neoplasm: Secondary | ICD-10-CM | POA: Diagnosis not present

## 2022-06-19 DIAGNOSIS — M79605 Pain in left leg: Secondary | ICD-10-CM | POA: Diagnosis not present

## 2022-06-19 DIAGNOSIS — M545 Low back pain, unspecified: Secondary | ICD-10-CM | POA: Diagnosis not present

## 2022-07-02 DIAGNOSIS — M545 Low back pain, unspecified: Secondary | ICD-10-CM | POA: Diagnosis not present

## 2022-07-10 DIAGNOSIS — M5416 Radiculopathy, lumbar region: Secondary | ICD-10-CM | POA: Diagnosis not present

## 2022-07-10 DIAGNOSIS — M47816 Spondylosis without myelopathy or radiculopathy, lumbar region: Secondary | ICD-10-CM | POA: Diagnosis not present

## 2022-07-11 ENCOUNTER — Ambulatory Visit: Payer: Medicare Other | Admitting: Family Medicine

## 2022-07-12 ENCOUNTER — Telehealth: Payer: Self-pay

## 2022-07-12 NOTE — Telephone Encounter (Signed)
Spoke with patient who is agreeable to do a tele visit on 2/7 at 9:40 am. Med and consent are done.

## 2022-07-12 NOTE — Telephone Encounter (Signed)
Primary Cardiologist:Mark Marlou Porch, MD   Preoperative team, please contact this patient and set up a phone call appointment for further preoperative risk assessment. Please obtain consent and complete medication review. Thank you for your help.   I confirm that guidance regarding antiplatelet and oral anticoagulation therapy has been completed and, if necessary, noted below.  Emmaline Life, NP-C  07/12/2022, 1:11 PM [address], [address] Office [phone] Fax [phone]

## 2022-07-12 NOTE — Telephone Encounter (Signed)
Patient with diagnosis of afib on Eliquis for anticoagulation.    Procedure: lumbar selective nerve root block  Date of procedure: 07/20/22  CHA2DS2-VASc Score = 3  This indicates a 3.2% annual risk of stroke. The patient's score is based upon: CHF History: 0 HTN History: 0 Diabetes History: 0 Stroke History: 0 Vascular Disease History: 1 Age Score: 2 Gender Score: 0   CrCl 39m/min Platelet count 218K  Per office protocol, patient can hold Eliquis for 3 days prior to procedure.    **This guidance is not considered finalized until pre-operative APP has relayed final recommendations.**

## 2022-07-12 NOTE — Telephone Encounter (Signed)
  Patient Consent for Virtual Visit        Jordan [name] has provided verbal consent on 07/12/2022 for a virtual visit (video or telephone).   CONSENT FOR VIRTUAL VISIT FOR:  Jordan [name]  By participating in this virtual visit I agree to the following:  I hereby voluntarily request, consent and authorize [facility] and its employed or contracted physicians, physician assistants, nurse practitioners or other licensed health care professionals (the Practitioner), to provide me with telemedicine health care services (the "Services") as deemed necessary by the treating Practitioner. I acknowledge and consent to receive the Services by the Practitioner via telemedicine. I understand that the telemedicine visit will involve communicating with the Practitioner through live audiovisual communication technology and the disclosure of certain medical information by electronic transmission. I acknowledge that I have been given the opportunity to request an in-person assessment or other available alternative prior to the telemedicine visit and am voluntarily participating in the telemedicine visit.  I understand that I have the right to withhold or withdraw my consent to the use of telemedicine in the course of my care at any time, without affecting my right to future care or treatment, and that the Practitioner or I may terminate the telemedicine visit at any time. I understand that I have the right to inspect all information obtained and/or recorded in the course of the telemedicine visit and may receive copies of available information for a reasonable fee.  I understand that some of the potential risks of receiving the Services via telemedicine include:  Delay or interruption in medical evaluation due to technological equipment failure or disruption; Information transmitted may not be sufficient (e.g. poor resolution of images) to allow for appropriate medical decision making by the  Practitioner; and/or  In rare instances, security protocols could fail, causing a breach of personal health information.  Furthermore, I acknowledge that it is my responsibility to provide information about my medical history, conditions and care that is complete and accurate to the best of my ability. I acknowledge that Practitioner's advice, recommendations, and/or decision may be based on factors not within their control, such as incomplete or inaccurate data provided by me or distortions of diagnostic images or specimens that may result from electronic transmissions. I understand that the practice of medicine is not an exact science and that Practitioner makes no warranties or guarantees regarding treatment outcomes. I acknowledge that a copy of this consent can be made available to me via my patient portal ([place]), or I can request a printed copy by calling the office of [facility].    I understand that my insurance will be billed for this visit.   I have read or had this consent read to me. I understand the contents of this consent, which adequately explains the benefits and risks of the Services being provided via telemedicine.  I have been provided ample opportunity to ask questions regarding this consent and the Services and have had my questions answered to my satisfaction. I give my informed consent for the services to be provided through the use of telemedicine in my medical care

## 2022-07-12 NOTE — Telephone Encounter (Signed)
..  Pre-operative Risk Assessment    Patient Name: Jordan [name]  DOB: 05/29/1946 MRN: [id number]      Request for Surgical Clearance    Procedure:   lumbar selective nerve root block  Date of Surgery:  Clearance 07/20/22                                 Surgeon:  Tomasita Crumble Surgeon's Group or Practice Name:  [facility] Phone number:  [phone] Fax number:  [phone]   Type of Clearance Requested:   - Medical  - Pharmacy:  Hold Apixaban (Eliquis)     Type of Anesthesia:  Not Indicated   Additional requests/questions:    Gwenlyn Found   07/12/2022, 8:07 AM

## 2022-07-19 ENCOUNTER — Ambulatory Visit: Payer: Medicare Other | Attending: Cardiovascular Disease | Admitting: Nurse Practitioner

## 2022-07-19 DIAGNOSIS — Z01810 Encounter for preprocedural cardiovascular examination: Secondary | ICD-10-CM

## 2022-07-19 NOTE — Progress Notes (Signed)
Virtual Visit via Telephone Note   Because of Jordan [name]'s co-morbid illnesses, he is at least at moderate risk for complications without adequate follow up.  This format is felt to be most appropriate for this patient at this time.  The patient did not have access to video technology/had technical difficulties with video requiring transitioning to audio format only (telephone).  All issues noted in this document were discussed and addressed.  No physical exam could be performed with this format.  Please refer to the patient's chart for his consent to telehealth for [facility].  Evaluation Performed:  Preoperative cardiovascular risk assessment _____________   Date:  07/19/2022   Patient ID:  Jordan [name], DOB 1946/01/01, MRN [id number] Patient Location:  Home Provider location:   Office  Primary Care Provider:  [name], DO Primary Cardiologist:  Candee Furbish, MD  Chief Complaint / Patient Profile   77 y.o. y/o male with a h/o CAD s/p DES-LAD, paroxysmal atrial fibrillation, hyperlipidemia, and OSA who is pending lumbar selective nerve root block on 07/20/2022 with [facility] and presents today for telephonic preoperative cardiovascular risk assessment.  History of Present Illness    Jordan [name] is a 77 y.o. male who presents via audio/video conferencing for a telehealth visit today.  Pt was last seen in cardiology clinic on 01/23/2022 by Dr. Curt Bears.  At that time Jordan [name] was doing well. The patient is now pending procedure as outlined above. Since his last visit, he has done well from a cardiac standpoint.   He denies chest pain, palpitations, dyspnea, pnd, orthopnea, n, v, dizziness, syncope, edema, weight gain, or early satiety. All other systems reviewed and are otherwise negative except as noted above.   Past Medical History    Past Medical History:  Diagnosis Date   Anxiety    Mild   Atrial fibrillation (HCC)    Paroxysmal   BPH  (benign prostatic hyperplasia)    CAD (coronary artery disease)    Stent LAD, 2002  /  nuclear 2009, no ischemia  /    Hospital October, 2011 nuclear, no ischemia, possible slight apical scar, ejection fraction 70%   Carotid bruit    Doppler November, 2011, normal   Chest pain 02/23/2013   Chronic anticoagulation    Low CHADS , score, but patient wants to be aggressive   Colon polyps    COVID-19    + long Covid problems   Drug intolerance    Mild beta blocker intolerance   Dyslipidemia    Ejection fraction    EF 65-70%, echo, 2007 /  EF 70%, nuclear, 2011   Hyperlipidemia    IBS (irritable bowel syndrome)    Incomplete RBBB    Knee pain    left   Personal history of colonic adenomas 11/29/2006   Qualifier: Diagnosis of  By: Danny Lawless CMA, [place]     Syncope    November, 2010   Past Surgical History:  Procedure Laterality Date   ATRIAL FIBRILLATION ABLATION N/A 10/25/2021   Procedure: ATRIAL FIBRILLATION ABLATION;  Surgeon: Constance Haw, MD;  Location: [facility];  Service: Cardiovascular;  Laterality: N/A;   BACK SURGERY  05/1990   L4L5S1   CARDIAC CATHETERIZATION     COLONOSCOPY  multiple   PROSTATE SURGERY     UMBILICAL HERNIA REPAIR  07/15/2021   [facility]   XI ROBOTIC ASSISTED SIMPLE PROSTATECTOMY  2022    Allergies  Allergies  Allergen Reactions  Levofloxacin Other (See Comments)    Couldn't breathe, passed out   Lopid [Gemfibrozil] Other (See Comments)    Loose bowels   Sulfonamide Derivatives Hives   Sulfa Antibiotics Rash    Home Medications    Prior to Admission medications   Medication Sig Start Date End Date Taking? Authorizing Provider  apixaban (ELIQUIS) 5 MG TABS tablet TAKE  (1)  TABLET TWICE A DAY. 10/18/21   Ronnie Doss M, DO  atorvastatin (LIPITOR) 80 MG tablet TAKE 1 TABLET DAILY AT 6PM 10/18/21   Ronnie Doss M, DO  Calcium Carb-Cholecalciferol (CALCIUM 600+D3 PO) Take 1 capsule by mouth daily.    [provider]   carboxymethylcellulose (REFRESH PLUS) 0.5 % SOLN Place 1 drop into both eyes 3 (three) times daily as needed. For dry eyes    [provider]  Cholecalciferol (VITAMIN D3) 1000 UNITS CAPS Take 1,000 Units by mouth daily.    [provider]  diltiazem (CARDIZEM CD) 180 MG 24 hr capsule Take 1 capsule (180 mg total) by mouth daily. 10/18/21   [name], DO  diltiazem (CARDIZEM) 60 MG tablet Take 1 tablet (60 mg total) by mouth as needed. 10/18/21   Ronnie Doss M, DO  GLUCOSAMINE-CHONDROITIN PO Take 1,500 mg by mouth daily.    [provider]  Multiple Vitamins-Minerals (MULTIVITAMIN WITH MINERALS) tablet Take 1 tablet by mouth daily.    [provider]  nitroGLYCERIN (NITROSTAT) 0.4 MG SL tablet Place 1 tablet (0.4 mg total) under the tongue every 5 (five) minutes as needed for chest pain. 10/18/21   [name], DO  Probiotic Product (ALIGN PO) Take 1 tablet by mouth daily as needed.    [provider]  triamcinolone cream (KENALOG) 0.1 % Apply 1 application topically 2 (two) times daily. Patient taking differently: Apply 1 application  topically daily as needed (tic bites). 01/18/21   Baruch Gouty, FNP    Physical Exam    Vital Signs:  Jordan [name] does not have vital signs available for review today.  Given telephonic nature of communication, physical exam is limited. AAOx3. NAD. Normal affect.  Speech and respirations are unlabored.  Accessory Clinical Findings    None  Assessment & Plan    1.  Preoperative Cardiovascular Risk Assessment:  According to the Revised Cardiac Risk Index (RCRI), his Perioperative Risk of Major Cardiac Event is (%): 0.9. His Functional Capacity in METs is: 7.99 according to the Duke Activity Status Index (DASI).Therefore, based on ACC/AHA guidelines, patient would be at acceptable risk for the planned procedure without further cardiovascular testing.   The patient was advised that if he  develops new symptoms prior to surgery to contact our office to arrange for a follow-up visit, and he verbalized understanding.  Per office protocol, patient can hold Eliquis for 3 days prior to procedure. Please resume Eliquis as soon as possible postprocedure, at the discretion of the surgeon.     A copy of this note will be routed to requesting surgeon.  Time:   Today, I have spent 6 minutes with the patient with telehealth technology discussing medical history, symptoms, and management plan.     Lenna Sciara, NP  07/19/2022, 9:52 AM

## 2022-07-20 DIAGNOSIS — M5416 Radiculopathy, lumbar region: Secondary | ICD-10-CM | POA: Diagnosis not present

## 2022-07-27 ENCOUNTER — Ambulatory Visit (HOSPITAL_COMMUNITY)
Admission: RE | Admit: 2022-07-27 | Discharge: 2022-07-27 | Disposition: A | Discharge status: Home / Self Care | Payer: Medicare Other | Source: Ambulatory Visit | Attending: Physician Assistant | Admitting: Physician Assistant

## 2022-07-27 ENCOUNTER — Encounter (HOSPITAL_COMMUNITY): Payer: Self-pay | Admitting: Physician Assistant

## 2022-07-27 VITALS — BP 132/78 | HR 70 | Ht 74.0 in | Wt 222.2 lb

## 2022-07-27 DIAGNOSIS — D6869 Other thrombophilia: Secondary | ICD-10-CM | POA: Diagnosis not present

## 2022-07-27 DIAGNOSIS — G4733 Obstructive sleep apnea (adult) (pediatric): Secondary | ICD-10-CM | POA: Insufficient documentation

## 2022-07-27 DIAGNOSIS — E785 Hyperlipidemia, unspecified: Secondary | ICD-10-CM | POA: Insufficient documentation

## 2022-07-27 DIAGNOSIS — I480 Paroxysmal atrial fibrillation: Secondary | ICD-10-CM | POA: Diagnosis not present

## 2022-07-27 DIAGNOSIS — Z7901 Long term (current) use of anticoagulants: Secondary | ICD-10-CM | POA: Insufficient documentation

## 2022-07-27 DIAGNOSIS — I2510 Atherosclerotic heart disease of native coronary artery without angina pectoris: Secondary | ICD-10-CM | POA: Diagnosis not present

## 2022-07-27 NOTE — Progress Notes (Signed)
Primary Care Physician: [name], DO Primary Cardiologist: Dr Marlou Porch Primary Electrophysiologist: Dr Curt Bears Referring Physician: Dr Harriette Bouillon is a 77 y.o. male with a history of CAD, HLD, OSA, atrial fibrillation who presents for follow up in the [facility]. Patient is on Eliquis for a CHADS2VASC score of 2. He underwent afib ablation with Dr Curt Bears on 10/25/21.   On follow up today, patient reports that he has done well since his last visit from a cardiac standpoint. He denies any interim symptoms of afib. No bleeding issues on anticoagulation. His primary concern is his back pain from his spinal stenosis. He has a visit with orthopedics coming up soon.   Today, he denies symptoms of palpitations, chest pain, shortness of breath, orthopnea, PND, lower extremity edema, dizziness, presyncope, syncope, bleeding, or neurologic sequela. The patient is tolerating medications without difficulties and is otherwise without complaint today.    Atrial Fibrillation Risk Factors:  he does have symptoms or diagnosis of sleep apnea. he is compliant with CPAP therapy. he does not have a history of rheumatic fever.   he has a BMI of Body mass index is 28.53 kg/m.Marland Kitchen Filed Weights   07/27/22 1354  Weight: 100.8 kg    Family History  Problem Relation Age of Onset   Heart attack Mother    Diabetes Mother    Macular degeneration Sister    Obesity Sister    Paranoid behavior Sister    Dementia Sister    Cancer Sister        lung - uterus    COPD Sister    Heart attack Brother    Heart disease Brother        cause of death heart attack   Cancer Daughter        colon    Stroke Maternal Grandfather    Stroke Paternal Grandfather    Colon cancer Neg Hx      Atrial Fibrillation Management history:  Previous antiarrhythmic drugs: none Previous cardioversions: none Previous ablations: 10/25/21 CHADS2VASC score: 2 Anticoagulation  history: Eliquis   Past Medical History:  Diagnosis Date   Anxiety    Mild   Atrial fibrillation (HCC)    Paroxysmal   BPH (benign prostatic hyperplasia)    CAD (coronary artery disease)    Stent LAD, 2002  /  nuclear 2009, no ischemia  /    Hospital October, 2011 nuclear, no ischemia, possible slight apical scar, ejection fraction 70%   Carotid bruit    Doppler November, 2011, normal   Chest pain 02/23/2013   Chronic anticoagulation    Low CHADS , score, but patient wants to be aggressive   Colon polyps    COVID-19    + long Covid problems   Drug intolerance    Mild beta blocker intolerance   Dyslipidemia    Ejection fraction    EF 65-70%, echo, 2007 /  EF 70%, nuclear, 2011   Hyperlipidemia    IBS (irritable bowel syndrome)    Incomplete RBBB    Knee pain    left   Personal history of colonic adenomas 11/29/2006   Qualifier: Diagnosis of  By: Danny Lawless CMA, [place]     Syncope    November, 2010   Past Surgical History:  Procedure Laterality Date   ATRIAL FIBRILLATION ABLATION N/A 10/25/2021   Procedure: ATRIAL FIBRILLATION ABLATION;  Surgeon: Constance Haw, MD;  Location: [facility];  Service: Cardiovascular;  Laterality: N/A;   BACK SURGERY  05/1990   [id number]   CARDIAC CATHETERIZATION     COLONOSCOPY  multiple   PROSTATE SURGERY     UMBILICAL HERNIA REPAIR  07/15/2021   [facility]   XI ROBOTIC ASSISTED SIMPLE PROSTATECTOMY  2022    Current Outpatient Medications  Medication Sig Dispense Refill   apixaban (ELIQUIS) 5 MG TABS tablet TAKE  (1)  TABLET TWICE A DAY. 180 tablet 3   atorvastatin (LIPITOR) 80 MG tablet TAKE 1 TABLET DAILY AT 6PM 90 tablet 3   Calcium Carb-Cholecalciferol (CALCIUM 600+D3 PO) Take 1 capsule by mouth daily.     carboxymethylcellulose (REFRESH PLUS) 0.5 % SOLN Place 1 drop into both eyes 3 (three) times daily as needed. For dry eyes     Cholecalciferol (VITAMIN D3) 1000 UNITS CAPS Take 1,000 Units by mouth daily.     diltiazem  (CARDIZEM CD) 180 MG 24 hr capsule Take 1 capsule (180 mg total) by mouth daily. 90 capsule 3   diltiazem (CARDIZEM) 60 MG tablet Take 1 tablet (60 mg total) by mouth as needed. 90 tablet 1   GLUCOSAMINE-CHONDROITIN PO Take 1,500 mg by mouth daily.     Multiple Vitamins-Minerals (MULTIVITAMIN WITH MINERALS) tablet Take 1 tablet by mouth daily.     nitroGLYCERIN (NITROSTAT) 0.4 MG SL tablet Place 1 tablet (0.4 mg total) under the tongue every 5 (five) minutes as needed for chest pain. 25 tablet 3   Probiotic Product (ALIGN PO) Take 1 tablet by mouth daily as needed.     triamcinolone cream (KENALOG) 0.1 % Apply 1 application topically 2 (two) times daily. (Patient taking differently: Apply 1 application  topically daily as needed (tic bites).) 30 g 0   No current facility-administered medications for this encounter.    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   [place] acres, raises chickens, retired Neurosurgeon    Financially cares for his disabled nephew and recently started caring for his disabled sister who resides in a nursing facility in [place] Strain: [place]  (11/21/2021)   Overall Financial Resource Strain (CARDIA)    Difficulty of Paying Living Expenses: Not hard at all  Food Insecurity: No Food Insecurity (11/21/2021)   Hunger Vital Sign    Worried About Running Out of Food in the Last Year: Never true    [place] in the Last Year: Never true  Transportation Needs: No Transportation Needs (11/21/2021)   PRAPARE -  Hydrologist (Medical): No    Lack of Transportation (Non-Medical): No  Physical Activity: Sufficiently Active (11/21/2021)   Exercise Vital Sign    Days of Exercise per Week: 7 days    Minutes of Exercise per Session: 60 min  Stress: No Stress Concern Present (11/21/2021)   [place]    Feeling of Stress : Not at all  Social Connections: [place] (11/21/2021)   Social Connection and Isolation Panel [NHANES]    Frequency of Communication with Friends and Family: More than three times a week    Frequency of Social Gatherings with Friends and Family: More than three times a week    Attends Religious Services: More than 4 times per year    Active Member of [employer] or Organizations: Yes    Attends Music therapist: More than 4 times per year    Marital Status: Married  Human resources officer Violence: Not At Risk (11/21/2021)   Humiliation, Afraid, Rape, and Kick questionnaire    Fear of Current or Ex-Partner: No    Emotionally Abused: No    Physically Abused: No    Sexually Abused: No     ROS- All systems are reviewed and negative except as per the HPI above.  Physical Exam: Vitals:   07/27/22 1354  BP: 132/78  Pulse: 70  Weight: 100.8 kg  Height: 6' 2"$  (1.88 m)    GEN- The patient is a well appearing elderly male, alert and oriented x 3 today.   HEENT-head normocephalic, atraumatic, sclera clear, conjunctiva pink, hearing intact, trachea  midline. Lungs- Clear to ausculation bilaterally, normal work of breathing Heart- Regular rate and rhythm, no murmurs, rubs or gallops  GI- soft, NT, ND, + BS Extremities- no clubbing, cyanosis, or edema MS- no significant deformity or atrophy Skin- no rash or lesion Psych- euthymic mood, full affect Neuro- strength and sensation are intact   Wt Readings from Last 3 Encounters:  07/27/22 100.8 kg  04/18/22 96.7 kg  03/28/22 95.3 kg    EKG today demonstrates  SR Vent. rate 70 BPM PR interval 134 ms QRS duration 96 ms QT/QTcB 368/397 ms  Echo 09/12/21 demonstrated  1. Left ventricular ejection fraction, by estimation, is 55 to 60%. Left  ventricular ejection fraction by 3D volume is 57 %. The left ventricle has normal function. The left ventricle has no regional wall motion  abnormalities. Left ventricular diastolic  parameters were normal.   2. Right ventricular systolic function is normal. The right ventricular  size is mildly enlarged. Tricuspid regurgitation signal is inadequate for  assessing PA pressure.   3. The mitral valve is normal in structure. Trivial mitral valve  regurgitation. No evidence of mitral stenosis.   4. The aortic valve is normal in structure. Aortic valve regurgitation is  not visualized. No aortic stenosis is present.   5. The inferior vena cava is dilated in size with >50% respiratory  variability, suggesting right atrial pressure of 8 mmHg.   Epic records are reviewed at length today  CHA2DS2-VASc Score = 3  The patient's score is based upon: CHF History: 0 HTN History: 0 Diabetes History: 0 Stroke History: 0 Vascular Disease History: 1 Age Score: 2 Gender Score: 0       ASSESSMENT AND PLAN: 1. Paroxysmal Atrial Fibrillation (ICD10:  I48.0) The patient's CHA2DS2-VASc score is 3, indicating a 3.2% annual risk of stroke.   S/p afib ablation 10/25/21 Patient appears  to be maintaining SR. Continue Eliquis 5 mg BID Continue diltiazem 180 mg  daily with 60 mg PRN for breakthrough episodes.  2. Secondary Hypercoagulable State (ICD10:  D68.69) The patient is at significant risk for stroke/thromboembolism based upon his CHA2DS2-VASc Score of 3.  Continue Apixaban (Eliquis). Watchman brochure given today.   3. CAD No anginal symptoms.  4. Obstructive sleep apnea Patient reports compliance with CPAP therapy.   Follow up with Dr Marlou Porch as scheduled. AF clinic in 6 months.    [facility] [address] [place], [place] [address] [phone] 07/27/2022 2:09 PM

## 2022-08-04 DIAGNOSIS — M47816 Spondylosis without myelopathy or radiculopathy, lumbar region: Secondary | ICD-10-CM | POA: Diagnosis not present

## 2022-08-04 DIAGNOSIS — M5416 Radiculopathy, lumbar region: Secondary | ICD-10-CM | POA: Diagnosis not present

## 2022-08-07 DIAGNOSIS — M5416 Radiculopathy, lumbar region: Secondary | ICD-10-CM | POA: Diagnosis not present

## 2022-08-09 ENCOUNTER — Telehealth: Payer: Self-pay | Admitting: *Deleted

## 2022-08-09 NOTE — Telephone Encounter (Signed)
No clinical updates noted at afib visit from 07/27/22. Ok to use previous clearance rec from 07/12/22 note - pt can hold Eliquis for 3 days prior to lumbar laminectomy.

## 2022-08-09 NOTE — Telephone Encounter (Signed)
PharmD- prior hold was for nerve root block. Now pending laminectomy with request to hold eliquis again. Is the advice the same with three day hold? Pt states surgeon requests a 2 day hold, although this is not listed on the clearance.

## 2022-08-09 NOTE — Telephone Encounter (Signed)
   Pre-operative Risk Assessment    Patient Name: Jordan [name]  DOB: August 13, 1945 MRN: [id number]      Request for Surgical Clearance    Procedure:   LUMBAR LAMINOTOMY   Date of Surgery:  Clearance TBD                                 Surgeon:  DR. Duffy Rhody Surgeon's Group or Practice Name:  [facility] Phone number:  [phone] Fax number:  [phone]   Type of Clearance Requested:   - Pharmacy:  Hold Apixaban (Eliquis) NOT INDICATED HOW LONG   Type of Anesthesia:  General    Additional requests/questions:    Astrid Divine   08/09/2022, 8:01 AM

## 2022-08-10 ENCOUNTER — Ambulatory Visit: Payer: Medicare Other | Attending: Physician Assistant

## 2022-08-10 ENCOUNTER — Telehealth: Payer: Self-pay | Admitting: *Deleted

## 2022-08-10 DIAGNOSIS — Z01810 Encounter for preprocedural cardiovascular examination: Secondary | ICD-10-CM | POA: Diagnosis not present

## 2022-08-10 NOTE — Telephone Encounter (Signed)
Per Nicholes Rough, NP add pt on today. Pt states he thought he was cleared already, said he was told notes were sent to surgeon office. Per [name] pt still needed cardiac clearance, just the recommendations for blood thinner were used. Pt agreeable to plan of care.

## 2022-08-10 NOTE — Progress Notes (Signed)
Virtual Visit via Telephone Note   Because of Jordan [name]'s co-morbid illnesses, he is at least at moderate risk for complications without adequate follow up.  This format is felt to be most appropriate for this patient at this time.  The patient did not have access to video technology/had technical difficulties with video requiring transitioning to audio format only (telephone).  All issues noted in this document were discussed and addressed.  No physical exam could be performed with this format.  Please refer to the patient's chart for his consent to telehealth for [facility].  Evaluation Performed:  Preoperative cardiovascular risk assessment _____________   Date:  08/10/2022   Patient ID:  Jordan [name], DOB 11-Nov-1945, MRN [id number] Patient Location:  Home Provider location:   Office  Primary Care Provider:  [name], DO Primary Cardiologist:  Jordan Furbish, MD  Chief Complaint / Patient Profile   77 y.o. y/o male with a h/o CAD, HLD, OSA, atrial fibrillation who is pending back surgery and presents today for telephonic preoperative cardiovascular risk assessment.  History of Present Illness    Jordan [name] is a 77 y.o. male who presents via audio/video conferencing for a telehealth visit today.  Pt was last seen in cardiology clinic on 07/27/22 by Jordan So, PA.  At that time Jordan [name] was doing well .  The patient is now pending procedure as outlined above. Since his last visit, he has been doing well. He underwent Ablation back in May 2023. He was playing pickleball most days of the week and was using the Elliptical the other days. He has follow-up with Dr. Marlou [name] in June. He is anxious to get his back surgery done. BP has been well controlled. Mid 60s-70s with his heart rate. 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 He is interested in getting the watchman procedure done down the road.   He can hold his Eliquis x 3 days prior to the procedure and  restart when medically safe to do [name].   Past Medical History    Past Medical History:  Diagnosis Date   Anxiety    Mild   Atrial fibrillation (HCC)    Paroxysmal   BPH (benign prostatic hyperplasia)    CAD (coronary artery disease)    Stent LAD, 2002  /  nuclear 2009, no ischemia  /    Hospital October, 2011 nuclear, no ischemia, possible slight apical scar, ejection fraction 70%   Carotid bruit    Doppler November, 2011, normal   Chest pain 02/23/2013   Chronic anticoagulation    Low CHADS , score, but patient wants to be aggressive   Colon polyps    COVID-19    + long Covid problems   Drug intolerance    Mild beta blocker intolerance   Dyslipidemia    Ejection fraction    EF 65-70%, echo, 2007 /  EF 70%, nuclear, 2011   Hyperlipidemia    IBS (irritable bowel syndrome)    Incomplete RBBB    Knee pain    left   Personal history of colonic adenomas 11/29/2006   Qualifier: Diagnosis of  By: Jordan [name] CMA, Jordan [name]     Syncope    November, 2010   Past Surgical History:  Procedure Laterality Date   ATRIAL FIBRILLATION ABLATION N/A 10/25/2021   Procedure: ATRIAL FIBRILLATION ABLATION;  Surgeon: Jordan Haw, MD;  Location: [facility];  Service: Cardiovascular;  Laterality: N/A;   BACK SURGERY  05/1990  [id number]   CARDIAC CATHETERIZATION     COLONOSCOPY  multiple   PROSTATE SURGERY     UMBILICAL HERNIA REPAIR  07/15/2021   [facility]   XI ROBOTIC ASSISTED SIMPLE PROSTATECTOMY  2022    Allergies  Allergies  Allergen Reactions   Levofloxacin Other (See Comments)    Couldn't breathe, passed out   Lopid [Gemfibrozil] Other (See Comments)    Loose bowels   Sulfonamide Derivatives Hives   Sulfa Antibiotics Rash    Home Medications    Prior to Admission medications   Medication Sig Start Date End Date Taking? Authorizing Provider  apixaban (ELIQUIS) 5 MG TABS tablet TAKE  (1)  TABLET TWICE A DAY. 10/18/21   Jordan Doss M, DO  atorvastatin (LIPITOR) 80 MG  tablet TAKE 1 TABLET DAILY AT 6PM 10/18/21   Jordan Doss M, DO  Calcium Carb-Cholecalciferol (CALCIUM 600+D3 PO) Take 1 capsule by mouth daily.    [provider]  carboxymethylcellulose (REFRESH PLUS) 0.5 % SOLN Place 1 drop into both eyes 3 (three) times daily as needed. For dry eyes    [provider]  Cholecalciferol (VITAMIN D3) 1000 UNITS CAPS Take 1,000 Units by mouth daily.    [provider]  diltiazem (CARDIZEM CD) 180 MG 24 hr capsule Take 1 capsule (180 mg total) by mouth daily. 10/18/21   Jordan Norlander, DO  diltiazem (CARDIZEM) 60 MG tablet Take 1 tablet (60 mg total) by mouth as needed. 10/18/21   Jordan Doss M, DO  GLUCOSAMINE-CHONDROITIN PO Take 1,500 mg by mouth daily.    [provider]  Multiple Vitamins-Minerals (MULTIVITAMIN WITH MINERALS) tablet Take 1 tablet by mouth daily.    [provider]  nitroGLYCERIN (NITROSTAT) 0.4 MG SL tablet Place 1 tablet (0.4 mg total) under the tongue every 5 (five) minutes as needed for chest pain. 10/18/21   Jordan Norlander, DO  Probiotic Product (ALIGN PO) Take 1 tablet by mouth daily as needed.    [provider]  triamcinolone cream (KENALOG) 0.1 % Apply 1 application topically 2 (two) times daily. Patient taking differently: Apply 1 application  topically daily as needed (tic bites). 01/18/21   Baruch Gouty, FNP    Physical Exam    Vital Signs:  Jordan [name] does not have vital signs available for review today.  Given telephonic nature of communication, physical exam is limited. AAOx3. NAD. Normal affect.  Speech and respirations are unlabored.  Accessory Clinical Findings    None  Assessment & Plan    1.  Preoperative Cardiovascular Risk Assessment:  Mr. Albers perioperative risk of a major cardiac event is 6.6% according to the Revised Cardiac Risk Index (RCRI).  Therefore, he is at high risk for perioperative complications.   His functional capacity is  good at 6.55 METs according to the Duke Activity Status Index (DASI). Recommendations: According to ACC/AHA guidelines, no further cardiovascular testing needed.  The patient may proceed to surgery at acceptable risk.   Antiplatelet and/or Anticoagulation Recommendations:  Eliquis (Apixaban) can be held for 3 days prior to surgery.  Please resume post op when felt to be safe.     The patient was advised that if he develops new symptoms prior to surgery to contact our office to arrange for a follow-up visit, and he verbalized understanding.  A copy of this note will be routed to requesting surgeon.  Time:   Today, I have spent 25 minutes with the patient with telehealth technology discussing medical  history, symptoms, and management plan.     Elgie Collard, PA-C  08/10/2022, 4:48 PM

## 2022-08-10 NOTE — Telephone Encounter (Signed)
Per Jordan Rough, NP add pt on today. Pt states he thought he was cleared already, said he was told notes were sent to surgeon office. Per Jordan [name] pt still needed cardiac clearance, just the recommendations for blood thinner were used. Pt agreeable to plan of care.       Patient Consent for Virtual Visit        Jordan [name] has provided verbal consent on 08/10/2022 for a virtual visit (video or telephone).   CONSENT FOR VIRTUAL VISIT FOR:  Jordan [name]  By participating in this virtual visit I agree to the following:  I hereby voluntarily request, consent and authorize [facility] and its employed or contracted physicians, physician assistants, nurse practitioners or other licensed health care professionals (the Practitioner), to provide me with telemedicine health care services (the "Services") as deemed necessary by the treating Practitioner. I acknowledge and consent to receive the Services by the Practitioner via telemedicine. I understand that the telemedicine visit will involve communicating with the Practitioner through live audiovisual communication technology and the disclosure of certain medical information by electronic transmission. I acknowledge that I have been given the opportunity to request an in-person assessment or other available alternative prior to the telemedicine visit and am voluntarily participating in the telemedicine visit.  I understand that I have the right to withhold or withdraw my consent to the use of telemedicine in the course of my care at any time, without affecting my right to future care or treatment, and that the Practitioner or I may terminate the telemedicine visit at any time. I understand that I have the right to inspect all information obtained and/or recorded in the course of the telemedicine visit and may receive copies of available information for a reasonable fee.  I understand that some of the potential risks of receiving the Services via  telemedicine include:  Delay or interruption in medical evaluation due to technological equipment failure or disruption; Information transmitted may not be sufficient (e.g. poor resolution of images) to allow for appropriate medical decision making by the Practitioner; and/or  In rare instances, security protocols could fail, causing a breach of personal health information.  Furthermore, I acknowledge that it is my responsibility to provide information about my medical history, conditions and care that is complete and accurate to the best of my ability. I acknowledge that Practitioner's advice, recommendations, and/or decision may be based on factors not within their control, such as incomplete or inaccurate data provided by me or distortions of diagnostic images or specimens that may result from electronic transmissions. I understand that the practice of medicine is not an exact science and that Practitioner makes no warranties or guarantees regarding treatment outcomes. I acknowledge that a copy of this consent can be made available to me via my patient portal ([facility]), or I can request a printed copy by calling the office of [facility].    I understand that my insurance will be billed for this visit.   I have read or had this consent read to me. I understand the contents of this consent, which adequately explains the benefits and risks of the Services being provided via telemedicine.  I have been provided ample opportunity to ask questions regarding this consent and the Services and have had my questions answered to my satisfaction. I give my informed consent for the services to be provided through the use of telemedicine in my medical care

## 2022-08-10 NOTE — Telephone Encounter (Signed)
   Name: Jordan [name]  DOB: [birth year]  MRN: [id number]  Primary Cardiologist: Candee Furbish, MD  Chart reviewed as part of pre-operative protocol coverage. Because of Devario Tison Rolfe's past medical history and time since last visit, he will require a follow-up telephone visit in order to better assess preoperative cardiovascular risk.  Pre-op covering staff: - Please schedule appointment and call patient to inform them. If patient already had an upcoming appointment within acceptable timeframe, please add "pre-op clearance" to the appointment notes so provider is aware. - Please contact requesting surgeon's office via preferred method (i.e, phone, fax) to inform them of need for appointment prior to surgery.  No clinical updates noted at afib visit from 07/27/22. Ok to use previous clearance rec from 07/12/22 note - pt can hold Eliquis for 3 days prior to lumbar laminectomy.  Elgie Collard, PA-C  08/10/2022, 8:05 AM

## 2022-08-17 ENCOUNTER — Other Ambulatory Visit: Payer: Self-pay | Admitting: Neurosurgery

## 2022-08-22 DIAGNOSIS — M5416 Radiculopathy, lumbar region: Secondary | ICD-10-CM | POA: Diagnosis not present

## 2022-08-31 DIAGNOSIS — G4733 Obstructive sleep apnea (adult) (pediatric): Secondary | ICD-10-CM | POA: Diagnosis not present

## 2022-09-04 NOTE — Pre-Procedure Instructions (Signed)
Surgical Instructions    Your procedure is scheduled on Wednesday, April 3rd.  Report to [facility] Main Entrance "A" at 12:00 P.M., then check in with the Admitting office.  Call this number if you have problems the morning of surgery:  [phone]  If you have any questions prior to your surgery date call [phone]: Open Monday-Friday 8am-4pm If you experience any cold or flu symptoms such as cough, fever, chills, shortness of breath, etc. between now and your scheduled surgery, please notify us at the above number.     Remember:  Do not eat after midnight the night before your surgery  You may drink clear liquids until 11:00 a.m. the morning of your surgery.   Clear liquids allowed are: Water, Non-Citrus Juices (without pulp), Carbonated Beverages, Clear Tea, Black Coffee Only (NO MILK, CREAM OR POWDERED CREAMER of any kind), and Gatorade.    Take these medicines the morning of surgery with A SIP OF WATER  diltiazem (CARDIZEM CD)   As needed: diltiazem (CARDIZEM) 60 MG tablet  nitroGLYCERIN (NITROSTAT)-please let a nurse know if you had to use this.  Eye drops  Per your surgeon, HOLD apixaban (ELIQUIS) 3 DAYS PRE-OP. Last dose should be March 30th, 2024.   As of today, STOP taking any Aspirin (unless otherwise instructed by your surgeon) Aleve, Naproxen, Ibuprofen, Motrin, Advil, Goody's, BC's, all herbal medications, fish oil, and all vitamins.                     Do NOT Smoke (Tobacco/Vaping) for 24 hours prior to your procedure.  If you use a CPAP at night, you may bring your mask/headgear for your overnight stay.   Contacts, glasses, piercing's, hearing aid's, dentures or partials may not be worn into surgery, please bring cases for these belongings.    For patients admitted to the hospital, discharge time will be determined by your treatment team.   Patients discharged the day of surgery will not be allowed to drive home, and someone needs to stay with them for 24  hours.  SURGICAL WAITING ROOM VISITATION Patients having surgery or a procedure may have no more than 2 support people in the waiting area - these visitors may rotate.   Children under the age of 78 must have an adult with them who is not the patient. If the patient needs to stay at the hospital during part of their recovery, the visitor guidelines for inpatient rooms apply. Pre-op nurse will coordinate an appropriate time for 1 support person to accompany patient in pre-op.  This support person may not rotate.   Please refer to the [facility] website for the visitor guidelines for Inpatients (after your surgery is over and you are in a regular room).    Special instructions:   [facility]- Preparing For Surgery  Before surgery, you can play an important role. Because skin is not sterile, your skin needs to be as free of germs as possible. You can reduce the number of germs on your skin by washing with CHG (chlorahexidine gluconate) Soap before surgery.  CHG is an antiseptic cleaner which kills germs and bonds with the skin to continue killing germs even after washing.    Oral Hygiene is also important to reduce your risk of infection.  Remember - BRUSH YOUR TEETH THE MORNING OF SURGERY WITH YOUR REGULAR TOOTHPASTE  Please do not use if you have an allergy to CHG or antibacterial soaps. If your skin becomes reddened/irritated stop using the  CHG.  Do not shave (including legs and underarms) for at least 48 hours prior to first CHG shower. It is OK to shave your face.  Please follow these instructions carefully.   Shower the NIGHT BEFORE SURGERY and the MORNING OF SURGERY  If you chose to wash your hair, wash your hair first as usual with your normal shampoo.  After you shampoo, rinse your hair and body thoroughly to remove the shampoo.  Use CHG Soap as you would any other liquid soap. You can apply CHG directly to the skin and wash gently with a scrungie or a clean washcloth.   Apply  the CHG Soap to your body ONLY FROM THE NECK DOWN.  Do not use on open wounds or open sores. Avoid contact with your eyes, ears, mouth and genitals (private parts). Wash Face and genitals (private parts)  with your normal soap.   Wash thoroughly, paying special attention to the area where your surgery will be performed.  Thoroughly rinse your body with warm water from the neck down.  DO NOT shower/wash with your normal soap after using and rinsing off the CHG Soap.  Pat yourself dry with a CLEAN TOWEL.  Wear CLEAN PAJAMAS to bed the night before surgery  Place CLEAN SHEETS on your bed the night before your surgery  DO NOT SLEEP WITH PETS.   Day of Surgery: Take a shower with CHG soap. Do not wear jewelry Do not wear lotions, powders, colognes, or deodorant. Do not shave 48 hours prior to surgery.  Men may shave face and neck. Do not bring valuables to the hospital.  [facility] is not responsible for any belongings or valuables. Wear Clean/Comfortable clothing the morning of surgery Remember to brush your teeth WITH YOUR REGULAR TOOTHPASTE.   Please read over the following fact sheets that you were given.    If you received a COVID test during your pre-op visit  it is requested that you wear a mask when out in public, stay away from anyone that may not be feeling well and notify your surgeon if you develop symptoms. If you have been in contact with anyone that has tested positive in the last 10 days please notify you surgeon.

## 2022-09-05 ENCOUNTER — Encounter (HOSPITAL_COMMUNITY)
Admission: RE | Admit: 2022-09-05 | Discharge: 2022-09-05 | Disposition: A | Discharge status: Home / Self Care | Payer: Medicare Other | Source: Ambulatory Visit | Attending: Neurosurgery | Admitting: Neurosurgery

## 2022-09-05 ENCOUNTER — Encounter (HOSPITAL_COMMUNITY): Payer: Self-pay

## 2022-09-05 ENCOUNTER — Other Ambulatory Visit: Payer: Self-pay | Admitting: Neurosurgery

## 2022-09-05 ENCOUNTER — Other Ambulatory Visit: Payer: Self-pay

## 2022-09-05 VITALS — BP 117/67 | HR 70 | Temp 97.6°F | Resp 18 | Ht 74.0 in | Wt 224.2 lb

## 2022-09-05 DIAGNOSIS — I10 Essential (primary) hypertension: Secondary | ICD-10-CM | POA: Insufficient documentation

## 2022-09-05 DIAGNOSIS — Z01812 Encounter for preprocedural laboratory examination: Secondary | ICD-10-CM | POA: Insufficient documentation

## 2022-09-05 DIAGNOSIS — Z01818 Encounter for other preprocedural examination: Secondary | ICD-10-CM

## 2022-09-05 HISTORY — DX: Malignant (primary) neoplasm, unspecified: C80.1

## 2022-09-05 HISTORY — DX: Sleep apnea, unspecified: G47.30

## 2022-09-05 LAB — CBC
HCT: 45.4 % (ref 39.0–52.0)
Hemoglobin: 14.8 g/dL (ref 13.0–17.0)
MCH: 30 pg (ref 26.0–34.0)
MCHC: 32.6 g/dL (ref 30.0–36.0)
MCV: 92.1 fL (ref 80.0–100.0)
Platelets: 205 10*3/uL (ref 150–400)
RBC: 4.93 MIL/uL (ref 4.22–5.81)
RDW: 13.1 % (ref 11.5–15.5)
WBC: 8.2 10*3/uL (ref 4.0–10.5)
nRBC: 0 % (ref 0.0–0.2)

## 2022-09-05 LAB — SURGICAL PCR SCREEN
MRSA, PCR: NEGATIVE
Staphylococcus aureus: NEGATIVE

## 2022-09-05 LAB — BASIC METABOLIC PANEL
Anion gap: 6 (ref 5–15)
BUN: 19 mg/dL (ref 8–23)
CO2: 29 mmol/L (ref 22–32)
Calcium: 8.7 mg/dL — ABNORMAL LOW (ref 8.9–10.3)
Chloride: 104 mmol/L (ref 98–111)
Creatinine, Ser: 0.98 mg/dL (ref 0.61–1.24)
GFR, Estimated: >60 mL/min (ref >60)
Glucose, Bld: 94 mg/dL (ref 70–99)
Potassium: 4.1 mmol/L (ref 3.5–5.1)
Sodium: 139 mmol/L (ref 135–145)

## 2022-09-05 NOTE — Progress Notes (Signed)
PCP - Ronnie Doss, DO Cardiologist - Dr. Candee Furbish EP: Dr. Curt Bears  PPM/ICD - n/a  Chest x-ray - n/a EKG - 07/27/22 Stress Test - 3017 ECHO - 09/12/21 Cardiac Cath -2002, 1 stent placed.   Sleep Study - OSA+ CPAP - uses nightly   Blood Thinner Instructions: Education officer, community, hold 3 days pre-op. LD 09/09/22. Aspirin Instructions: n/a  ERAS Protcol -Clear liquids until 1100 DOS PRE-SURGERY Ensure or G2- none ordered.  COVID TEST- n/a  Anesthesia review: Yes, hx of CAD and A.Fib.   Patient denies shortness of breath, fever, cough and chest pain at PAT appointment   All instructions explained to the patient, with a verbal understanding of the material. Patient agrees to go over the instructions while at home for a better understanding. Patient also instructed to self quarantine after being tested for COVID-19. The opportunity to ask questions was provided.

## 2022-09-06 NOTE — Progress Notes (Signed)
Anesthesia Chart Review:  Follows with cardiology for history of CAD, HLD, OSA, atrial fibrillation s/p ablation 10/25/2021.  Seen by Nicholes Rough, PA-C 08/10/2022 for preop evaluation.  Per note, "Mr. Hunkele's perioperative risk of a major cardiac event is 6.6% according to the Revised Cardiac Risk Index (RCRI).  Therefore, he is at high risk for perioperative complications.   His functional capacity is good at 6.55 METs according to the Duke Activity Status Index (DASI). Recommendations: According to ACC/AHA guidelines, no further cardiovascular testing needed.  The patient may proceed to surgery at acceptable risk.  Antiplatelet and/or Anticoagulation Recommendations: Eliquis (Apixaban) can be held for 3 days prior to surgery.  Please resume post op when felt to be safe."  Patient reports last dose Eliquis 09/09/2022.  Preop labs reviewed, unremarkable.  EKG 07/27/2022: Normal sinus rhythm. Incomplete right bundle branch block  TTE 09/12/2021:  1. Left ventricular ejection fraction, by estimation, is 55 to 60%. Left  ventricular ejection fraction by 3D volume is 57 %. The left ventricle has  normal function. The left ventricle has no regional wall motion  abnormalities. Left ventricular diastolic   parameters were normal.   2. Right ventricular systolic function is normal. The right ventricular  size is mildly enlarged. Tricuspid regurgitation signal is inadequate for  assessing PA pressure.   3. The mitral valve is normal in structure. Trivial mitral valve  regurgitation. No evidence of mitral stenosis.   4. The aortic valve is normal in structure. Aortic valve regurgitation is  not visualized. No aortic stenosis is present.   5. The inferior vena cava is dilated in size with >50% respiratory  variability, suggesting right atrial pressure of 8 mmHg.   Exercise tolerance test 10/05/2015: There was no ST segment deviation noted during stress.   ETT with good exercise tolerance (9:00); no chest  pain; normal BP response; no ST changes; negative adequate ETT.   Wynonia Musty [facility] Short Stay Center/Anesthesiology Phone [phone] 09/06/2022 3:46 PM

## 2022-09-06 NOTE — Anesthesia Preprocedure Evaluation (Addendum)
Anesthesia Evaluation  Patient identified by MRN, date of birth, ID band Patient awake    Reviewed: Allergy & Precautions, NPO status , Patient's Chart, lab work & pertinent test results  History of Anesthesia Complications Negative for: history of anesthetic complications  Airway Mallampati: II  TM Distance: >3 FB Neck ROM: Full    Dental  (+) Implants, Caps, Dental Advisory Given   Pulmonary sleep apnea and Continuous Positive Airway Pressure Ventilation    breath sounds clear to auscultation       Cardiovascular hypertension, Pt. on medications + CAD (LAD stent)  + dysrhythmias (successful ablation) Atrial Fibrillation  Rhythm:Regular Rate:Normal  '23 ECHO: EF 55-60%.  1.LV ejection fraction by 3D volume is 57 %. The left ventricle has  normal function. The left ventricle has no regional wall motion abnormalities. Left ventricular diastolic parameters were normal.   2. Right ventricular systolic function is normal  3. Trivial MR    Neuro/Psych   Anxiety     Back pain    GI/Hepatic negative GI ROS, Neg liver ROS,,,  Endo/Other  negative endocrine ROS    Renal/GU negative Renal ROS     Musculoskeletal   Abdominal   Peds  Hematology Eliquis: 09/09/2022 last dose   Anesthesia Other Findings   Reproductive/Obstetrics                             Anesthesia Physical Anesthesia Plan  ASA: 3  Anesthesia Plan: General   Post-op Pain Management: Tylenol PO (pre-op)*   Induction: Intravenous  PONV Risk Score and Plan: 2 and Ondansetron and Dexamethasone  Airway Management Planned: Oral ETT  Additional Equipment: None  Intra-op Plan:   Post-operative Plan: Extubation in OR  Informed Consent: I have reviewed the patients History and Physical, chart, labs and discussed the procedure including the risks, benefits and alternatives for the proposed anesthesia with the patient or  authorized representative who has indicated his/her understanding and acceptance.     Dental advisory given  Plan Discussed with: CRNA and Surgeon  Anesthesia Plan Comments: (PAT note by Karoline Caldwell, PA-C:  Follows with cardiology for history of CAD, HLD, OSA, atrial fibrillation s/p ablation 10/25/2021.  Seen by Nicholes Rough, PA-C 08/10/2022 for preop evaluation.  Per note, "Mr. Blue's perioperative risk of a major cardiac event is 6.6% according to the Revised Cardiac Risk Index (RCRI).  Therefore, he is at high risk for perioperative complications.   His functional capacity is good at 6.55 METs according to the Duke Activity Status Index (DASI). Recommendations: According to ACC/AHA guidelines, no further cardiovascular testing needed.  The patient may proceed to surgery at acceptable risk.  Antiplatelet and/or Anticoagulation Recommendations: Eliquis (Apixaban) can be held for 3 days prior to surgery.  Please resume post op when felt to be safe."  Patient reports last dose Eliquis 09/09/2022.  Preop labs reviewed, unremarkable.  EKG 07/27/2022: Normal sinus rhythm. Incomplete right bundle branch block  TTE 09/12/2021: 1. Left ventricular ejection fraction, by estimation, is 55 to 60%. Left  ventricular ejection fraction by 3D volume is 57 %. The left ventricle has  normal function. The left ventricle has no regional wall motion  abnormalities. Left ventricular diastolic  parameters were normal.  2. Right ventricular systolic function is normal. The right ventricular  size is mildly enlarged. Tricuspid regurgitation signal is inadequate for  assessing PA pressure.  3. The mitral valve is normal in structure. Trivial mitral valve  regurgitation. No evidence of mitral stenosis.  4. The aortic valve is normal in structure. Aortic valve regurgitation is  not visualized. No aortic stenosis is present.  5. The inferior vena cava is dilated in size with >50% respiratory  variability,  suggesting right atrial pressure of 8 mmHg.   Exercise tolerance test 10/05/2015: ? There was no ST segment deviation noted during stress.   ETT with good exercise tolerance (9:00); no chest pain; normal BP response; no ST changes; negative adequate ETT.   )        Anesthesia Quick Evaluation

## 2022-09-13 ENCOUNTER — Other Ambulatory Visit: Payer: Self-pay

## 2022-09-13 ENCOUNTER — Encounter (HOSPITAL_COMMUNITY): Payer: Self-pay | Admitting: *Deleted

## 2022-09-13 ENCOUNTER — Observation Stay (HOSPITAL_COMMUNITY)
Admission: RE | Admit: 2022-09-13 | Discharge: 2022-09-14 | Disposition: A | Discharge status: Home / Self Care | Payer: Medicare Other | Attending: Neurosurgery | Admitting: Neurosurgery

## 2022-09-13 ENCOUNTER — Ambulatory Visit (HOSPITAL_BASED_OUTPATIENT_CLINIC_OR_DEPARTMENT_OTHER): Payer: Medicare Other

## 2022-09-13 ENCOUNTER — Ambulatory Visit (HOSPITAL_COMMUNITY): Payer: Medicare Other

## 2022-09-13 ENCOUNTER — Encounter (HOSPITAL_COMMUNITY)
Admission: RE | Disposition: A | Discharge status: Home / Self Care | Payer: Self-pay | Source: Home / Self Care | Attending: Neurosurgery

## 2022-09-13 ENCOUNTER — Ambulatory Visit (HOSPITAL_COMMUNITY): Payer: Medicare Other | Admitting: Physician Assistant

## 2022-09-13 DIAGNOSIS — Z7901 Long term (current) use of anticoagulants: Secondary | ICD-10-CM | POA: Diagnosis not present

## 2022-09-13 DIAGNOSIS — M4726 Other spondylosis with radiculopathy, lumbar region: Principal | ICD-10-CM | POA: Diagnosis present

## 2022-09-13 DIAGNOSIS — Z955 Presence of coronary angioplasty implant and graft: Secondary | ICD-10-CM | POA: Diagnosis not present

## 2022-09-13 DIAGNOSIS — I2510 Atherosclerotic heart disease of native coronary artery without angina pectoris: Secondary | ICD-10-CM | POA: Diagnosis not present

## 2022-09-13 DIAGNOSIS — I4891 Unspecified atrial fibrillation: Secondary | ICD-10-CM

## 2022-09-13 DIAGNOSIS — Z8616 Personal history of COVID-19: Secondary | ICD-10-CM | POA: Diagnosis not present

## 2022-09-13 DIAGNOSIS — M5416 Radiculopathy, lumbar region: Secondary | ICD-10-CM

## 2022-09-13 DIAGNOSIS — I10 Essential (primary) hypertension: Secondary | ICD-10-CM | POA: Diagnosis not present

## 2022-09-13 DIAGNOSIS — G4733 Obstructive sleep apnea (adult) (pediatric): Secondary | ICD-10-CM | POA: Diagnosis not present

## 2022-09-13 DIAGNOSIS — Z79899 Other long term (current) drug therapy: Secondary | ICD-10-CM | POA: Insufficient documentation

## 2022-09-13 DIAGNOSIS — Z981 Arthrodesis status: Secondary | ICD-10-CM | POA: Diagnosis not present

## 2022-09-13 DIAGNOSIS — Z4789 Encounter for other orthopedic aftercare: Secondary | ICD-10-CM | POA: Diagnosis not present

## 2022-09-13 DIAGNOSIS — I480 Paroxysmal atrial fibrillation: Secondary | ICD-10-CM | POA: Diagnosis not present

## 2022-09-13 DIAGNOSIS — Z8546 Personal history of malignant neoplasm of prostate: Secondary | ICD-10-CM | POA: Diagnosis not present

## 2022-09-13 DIAGNOSIS — Z9989 Dependence on other enabling machines and devices: Secondary | ICD-10-CM

## 2022-09-13 DIAGNOSIS — M5116 Intervertebral disc disorders with radiculopathy, lumbar region: Secondary | ICD-10-CM | POA: Diagnosis not present

## 2022-09-13 HISTORY — PX: LUMBAR LAMINECTOMY/ DECOMPRESSION WITH MET-RX: SHX5959

## 2022-09-13 SURGERY — LUMBAR LAMINECTOMY/ DECOMPRESSION WITH MET-RX
Anesthesia: General | Laterality: Left

## 2022-09-13 MED ORDER — OXYCODONE HCL 5 MG PO TABS: 5.0000 mg | ORAL_TABLET | Freq: Once | ORAL | Status: DC | PRN

## 2022-09-13 MED ORDER — METHOCARBAMOL 500 MG PO TABS
500.0000 mg | ORAL_TABLET | Freq: Four times a day (QID) | ORAL | Status: DC | PRN
  Administered 2022-09-13 – 2022-09-14 (×2): 500 mg via ORAL
  Filled 2022-09-13 (×2): qty 1

## 2022-09-13 MED ORDER — HYDROMORPHONE HCL 1 MG/ML IJ SOLN: 0.2500 mg | INTRAMUSCULAR | Status: DC | PRN

## 2022-09-13 MED ORDER — CEFAZOLIN SODIUM-DEXTROSE 2-4 GM/100ML-% IV SOLN
2.0000 g | Freq: Three times a day (TID) | INTRAVENOUS | Status: AC
  Administered 2022-09-13 – 2022-09-14 (×2): 2 g via INTRAVENOUS
  Filled 2022-09-13 (×2): qty 100

## 2022-09-13 MED ORDER — CHLORHEXIDINE GLUCONATE CLOTH 2 % EX PADS: 6.0000 | MEDICATED_PAD | Freq: Once | CUTANEOUS | Status: DC

## 2022-09-13 MED ORDER — ORAL CARE MOUTH RINSE: 15.0000 mL | Freq: Once | OROMUCOSAL | Status: AC

## 2022-09-13 MED ORDER — ROCURONIUM BROMIDE 10 MG/ML (PF) SYRINGE
PREFILLED_SYRINGE | INTRAVENOUS | Status: DC | PRN
  Administered 2022-09-13: 60 mg via INTRAVENOUS
  Administered 2022-09-13 (×2): 20 mg via INTRAVENOUS

## 2022-09-13 MED ORDER — PHENYLEPHRINE 80 MCG/ML (10ML) SYRINGE FOR IV PUSH (FOR BLOOD PRESSURE SUPPORT)
PREFILLED_SYRINGE | INTRAVENOUS | Status: DC | PRN
  Administered 2022-09-13: 160 ug via INTRAVENOUS
  Administered 2022-09-13 (×2): 80 ug via INTRAVENOUS

## 2022-09-13 MED ORDER — HYDROCODONE-ACETAMINOPHEN 5-325 MG PO TABS: 1.0000 | ORAL_TABLET | ORAL | Status: DC | PRN

## 2022-09-13 MED ORDER — PHENOL 1.4 % MT LIQD: 1.0000 | OROMUCOSAL | Status: DC | PRN

## 2022-09-13 MED ORDER — METHYLPREDNISOLONE ACETATE 80 MG/ML IJ SUSP
INTRAMUSCULAR | Status: DC | PRN
  Administered 2022-09-13: 80 mg

## 2022-09-13 MED ORDER — METHYLPREDNISOLONE ACETATE 80 MG/ML IJ SUSP
INTRAMUSCULAR | Status: AC
  Filled 2022-09-13: qty 1

## 2022-09-13 MED ORDER — ACETAMINOPHEN 500 MG PO TABS
1000.0000 mg | ORAL_TABLET | Freq: Once | ORAL | Status: AC
  Administered 2022-09-13: 1000 mg via ORAL
  Filled 2022-09-13: qty 2

## 2022-09-13 MED ORDER — ACETAMINOPHEN 325 MG PO TABS: 650.0000 mg | ORAL_TABLET | ORAL | Status: DC | PRN

## 2022-09-13 MED ORDER — ATORVASTATIN CALCIUM 80 MG PO TABS
80.0000 mg | ORAL_TABLET | Freq: Every day | ORAL | Status: DC
  Administered 2022-09-13: 80 mg via ORAL
  Filled 2022-09-13: qty 1

## 2022-09-13 MED ORDER — SODIUM CHLORIDE 0.9% FLUSH: 3.0000 mL | INTRAVENOUS | Status: DC | PRN

## 2022-09-13 MED ORDER — PROMETHAZINE HCL 25 MG/ML IJ SOLN: 6.2500 mg | INTRAMUSCULAR | Status: DC | PRN

## 2022-09-13 MED ORDER — LIDOCAINE 2% (20 MG/ML) 5 ML SYRINGE
INTRAMUSCULAR | Status: DC | PRN
  Administered 2022-09-13: 40 mg via INTRAVENOUS

## 2022-09-13 MED ORDER — MUPIROCIN CALCIUM 2 % EX CREA: TOPICAL_CREAM | CUTANEOUS | Status: DC | PRN

## 2022-09-13 MED ORDER — CHLORHEXIDINE GLUCONATE 0.12 % MT SOLN
OROMUCOSAL | Status: AC
  Administered 2022-09-13: 15 mL via OROMUCOSAL
  Filled 2022-09-13: qty 15

## 2022-09-13 MED ORDER — APIXABAN 5 MG PO TABS: 5.0000 mg | ORAL_TABLET | Freq: Two times a day (BID) | ORAL | Status: DC

## 2022-09-13 MED ORDER — NITROGLYCERIN 0.4 MG SL SUBL: 0.4000 mg | SUBLINGUAL_TABLET | SUBLINGUAL | Status: DC | PRN

## 2022-09-13 MED ORDER — MIDAZOLAM HCL 2 MG/2ML IJ SOLN: 0.5000 mg | Freq: Once | INTRAMUSCULAR | Status: DC | PRN

## 2022-09-13 MED ORDER — ONDANSETRON HCL 4 MG/2ML IJ SOLN
INTRAMUSCULAR | Status: DC | PRN
  Administered 2022-09-13: 4 mg via INTRAVENOUS

## 2022-09-13 MED ORDER — DOCUSATE SODIUM 100 MG PO CAPS
100.0000 mg | ORAL_CAPSULE | Freq: Two times a day (BID) | ORAL | Status: DC
  Administered 2022-09-13 – 2022-09-14 (×2): 100 mg via ORAL
  Filled 2022-09-13 (×2): qty 1

## 2022-09-13 MED ORDER — DILTIAZEM HCL 60 MG PO TABS: 60.0000 mg | ORAL_TABLET | ORAL | Status: DC | PRN

## 2022-09-13 MED ORDER — FENTANYL CITRATE (PF) 250 MCG/5ML IJ SOLN
INTRAMUSCULAR | Status: DC | PRN
  Administered 2022-09-13: 100 ug via INTRAVENOUS

## 2022-09-13 MED ORDER — FENTANYL CITRATE (PF) 250 MCG/5ML IJ SOLN
INTRAMUSCULAR | Status: AC
  Filled 2022-09-13: qty 5

## 2022-09-13 MED ORDER — BUPIVACAINE HCL (PF) 0.5 % IJ SOLN
INTRAMUSCULAR | Status: AC
  Filled 2022-09-13: qty 30

## 2022-09-13 MED ORDER — CHLORHEXIDINE GLUCONATE 0.12 % MT SOLN: 15.0000 mL | Freq: Once | OROMUCOSAL | Status: AC

## 2022-09-13 MED ORDER — ONDANSETRON HCL 4 MG PO TABS: 4.0000 mg | ORAL_TABLET | Freq: Four times a day (QID) | ORAL | Status: DC | PRN

## 2022-09-13 MED ORDER — LIDOCAINE-EPINEPHRINE 1 %-1:100000 IJ SOLN
INTRAMUSCULAR | Status: DC | PRN
  Administered 2022-09-13: 4 mL

## 2022-09-13 MED ORDER — ACETAMINOPHEN 650 MG RE SUPP: 650.0000 mg | RECTAL | Status: DC | PRN

## 2022-09-13 MED ORDER — KETOROLAC TROMETHAMINE 15 MG/ML IJ SOLN
7.5000 mg | Freq: Four times a day (QID) | INTRAMUSCULAR | Status: DC
  Administered 2022-09-13 – 2022-09-14 (×3): 7.5 mg via INTRAVENOUS
  Filled 2022-09-13 (×3): qty 1

## 2022-09-13 MED ORDER — OXYCODONE HCL 5 MG/5ML PO SOLN: 5.0000 mg | Freq: Once | ORAL | Status: DC | PRN

## 2022-09-13 MED ORDER — THROMBIN 5000 UNITS EX SOLR
OROMUCOSAL | Status: DC | PRN
  Administered 2022-09-13: 5 mL via TOPICAL

## 2022-09-13 MED ORDER — THROMBIN 5000 UNITS EX SOLR
CUTANEOUS | Status: AC
  Filled 2022-09-13: qty 15000

## 2022-09-13 MED ORDER — CEFAZOLIN SODIUM-DEXTROSE 2-4 GM/100ML-% IV SOLN
2.0000 g | INTRAVENOUS | Status: AC
  Administered 2022-09-13: 2 g via INTRAVENOUS

## 2022-09-13 MED ORDER — DEXAMETHASONE SODIUM PHOSPHATE 10 MG/ML IJ SOLN
INTRAMUSCULAR | Status: DC | PRN
  Administered 2022-09-13: 10 mg via INTRAVENOUS

## 2022-09-13 MED ORDER — BUPIVACAINE HCL 0.5 % IJ SOLN
INTRAMUSCULAR | Status: DC | PRN
  Administered 2022-09-13: 8 mL

## 2022-09-13 MED ORDER — ONDANSETRON HCL 4 MG/2ML IJ SOLN: 4.0000 mg | Freq: Four times a day (QID) | INTRAMUSCULAR | Status: DC | PRN

## 2022-09-13 MED ORDER — HEMOSTATIC AGENTS (NO CHARGE) OPTIME: TOPICAL | Status: DC | PRN

## 2022-09-13 MED ORDER — PHENYLEPHRINE HCL-NACL 20-0.9 MG/250ML-% IV SOLN
INTRAVENOUS | Status: DC | PRN
  Administered 2022-09-13: 20 ug/min via INTRAVENOUS

## 2022-09-13 MED ORDER — LACTATED RINGERS IV SOLN: INTRAVENOUS | Status: DC

## 2022-09-13 MED ORDER — SODIUM CHLORIDE 0.9% FLUSH
3.0000 mL | Freq: Two times a day (BID) | INTRAVENOUS | Status: DC
  Administered 2022-09-13: 3 mL via INTRAVENOUS

## 2022-09-13 MED ORDER — 0.9 % SODIUM CHLORIDE (POUR BTL) OPTIME
TOPICAL | Status: DC | PRN
  Administered 2022-09-13: 1000 mL

## 2022-09-13 MED ORDER — HYDROMORPHONE HCL 1 MG/ML IJ SOLN: 0.5000 mg | INTRAMUSCULAR | Status: DC | PRN

## 2022-09-13 MED ORDER — SODIUM CHLORIDE 0.9 % IV SOLN
250.0000 mL | INTRAVENOUS | Status: DC
  Administered 2022-09-13: 250 mL via INTRAVENOUS

## 2022-09-13 MED ORDER — NON FORMULARY: Freq: Every day | Status: DC

## 2022-09-13 MED ORDER — MEPERIDINE HCL 25 MG/ML IJ SOLN: 6.2500 mg | INTRAMUSCULAR | Status: DC | PRN

## 2022-09-13 MED ORDER — PROPOFOL 10 MG/ML IV BOLUS
INTRAVENOUS | Status: AC
  Filled 2022-09-13: qty 20

## 2022-09-13 MED ORDER — DILTIAZEM HCL ER COATED BEADS 180 MG PO CP24
180.0000 mg | ORAL_CAPSULE | Freq: Every day | ORAL | Status: DC
  Administered 2022-09-14: 180 mg via ORAL
  Filled 2022-09-13: qty 1

## 2022-09-13 MED ORDER — CEFAZOLIN SODIUM-DEXTROSE 2-4 GM/100ML-% IV SOLN
INTRAVENOUS | Status: AC
  Filled 2022-09-13: qty 100

## 2022-09-13 MED ORDER — PROPOFOL 10 MG/ML IV BOLUS
INTRAVENOUS | Status: DC | PRN
  Administered 2022-09-13: 110 mg via INTRAVENOUS

## 2022-09-13 MED ORDER — HYDROCODONE-ACETAMINOPHEN 10-325 MG PO TABS
1.0000 | ORAL_TABLET | ORAL | Status: DC | PRN
  Administered 2022-09-13 – 2022-09-14 (×3): 1 via ORAL
  Filled 2022-09-13 (×3): qty 1

## 2022-09-13 MED ORDER — METHOCARBAMOL 1000 MG/10ML IJ SOLN: 500.0000 mg | Freq: Four times a day (QID) | INTRAVENOUS | Status: DC | PRN

## 2022-09-13 MED ORDER — MENTHOL 3 MG MT LOZG: 1.0000 | LOZENGE | OROMUCOSAL | Status: DC | PRN

## 2022-09-13 MED ORDER — LIDOCAINE-EPINEPHRINE 1 %-1:100000 IJ SOLN
INTRAMUSCULAR | Status: AC
  Filled 2022-09-13: qty 1

## 2022-09-13 SURGICAL SUPPLY — 65 items
ADH SKN CLS APL DERMABOND .7 (GAUZE/BANDAGES/DRESSINGS) ×1
APL SKNCLS STERI-STRIP NONHPOA (GAUZE/BANDAGES/DRESSINGS)
BAG COUNTER SPONGE SURGICOUNT (BAG) ×1 IMPLANT
BAG SPNG CNTER NS LX DISP (BAG) ×2
BAND INSRT 18 STRL LF DISP RB (MISCELLANEOUS) ×2
BAND RUBBER #18 3X1/16 STRL (MISCELLANEOUS) ×2 IMPLANT
BENZOIN TINCTURE PRP APPL 2/3 (GAUZE/BANDAGES/DRESSINGS) IMPLANT
BLADE CLIPPER SURG (BLADE) IMPLANT
BUR CARBIDE MATCH 3.0 (BURR) IMPLANT
BUR SURGICAL HEAD PROX 3X12.5 (BURR) ×1 IMPLANT
BURR SURGICAL HEAD PROX 3X12.5 (BURR) ×1
CANISTER SUCT 3000ML PPV (MISCELLANEOUS) ×1 IMPLANT
DERMABOND ADVANCED .7 DNX12 (GAUZE/BANDAGES/DRESSINGS) IMPLANT
DRAPE C-ARM 42X72 X-RAY (DRAPES) ×2 IMPLANT
DRAPE LAPAROTOMY 100X72X124 (DRAPES) ×1 IMPLANT
DRAPE MICROSCOPE SLANT 54X150 (MISCELLANEOUS) ×1 IMPLANT
DRAPE SURG 17X23 STRL (DRAPES) ×1 IMPLANT
DRESSING MEPILEX FLEX 4X4 (GAUZE/BANDAGES/DRESSINGS) ×1 IMPLANT
DRSG MEPILEX FLEX 4X4 (GAUZE/BANDAGES/DRESSINGS) ×1
DRSG OPSITE POSTOP 3X4 (GAUZE/BANDAGES/DRESSINGS) IMPLANT
DURAPREP 26ML APPLICATOR (WOUND CARE) ×1 IMPLANT
ELECT BLADE INSULATED 4IN (ELECTROSURGICAL) ×1
ELECT REM PT RETURN 9FT ADLT (ELECTROSURGICAL) ×1
ELECTRODE BLADE INSULATED 4IN (ELECTROSURGICAL) ×1 IMPLANT
ELECTRODE REM PT RTRN 9FT ADLT (ELECTROSURGICAL) ×1 IMPLANT
GAUZE 4X4 16PLY ~~LOC~~+RFID DBL (SPONGE) IMPLANT
GAUZE SPONGE 4X4 12PLY STRL (GAUZE/BANDAGES/DRESSINGS) IMPLANT
GLOVE BIO SURGEON STRL SZ7 (GLOVE) ×2 IMPLANT
GLOVE BIOGEL PI IND STRL 7.0 (GLOVE) ×1 IMPLANT
GLOVE BIOGEL PI IND STRL 7.5 (GLOVE) ×3 IMPLANT
GLOVE ECLIPSE 7.5 STRL STRAW (GLOVE) ×1 IMPLANT
GLOVE SURG SS PI 7.0 STRL IVOR (GLOVE) IMPLANT
GOWN STRL REUS W/ TWL LRG LVL3 (GOWN DISPOSABLE) ×2 IMPLANT
GOWN STRL REUS W/ TWL XL LVL3 (GOWN DISPOSABLE) ×1 IMPLANT
GOWN STRL REUS W/TWL 2XL LVL3 (GOWN DISPOSABLE) IMPLANT
GOWN STRL REUS W/TWL LRG LVL3 (GOWN DISPOSABLE) ×2
GOWN STRL REUS W/TWL XL LVL3 (GOWN DISPOSABLE) ×3
HEMOSTAT POWDER KIT SURGIFOAM (HEMOSTASIS) ×1 IMPLANT
IV CATH AUTO 14GX1.75 SAFE ORG (IV SOLUTION) ×1 IMPLANT
KIT BASIN OR (CUSTOM PROCEDURE TRAY) ×1 IMPLANT
KIT TURNOVER KIT B (KITS) ×1 IMPLANT
NDL HYPO 18GX1.5 BLUNT FILL (NEEDLE) IMPLANT
NDL HYPO 25X1 1.5 SAFETY (NEEDLE) ×1 IMPLANT
NDL SPNL 18GX3.5 QUINCKE PK (NEEDLE) IMPLANT
NEEDLE HYPO 18GX1.5 BLUNT FILL (NEEDLE) ×1 IMPLANT
NEEDLE HYPO 25X1 1.5 SAFETY (NEEDLE) ×1 IMPLANT
NEEDLE SPNL 18GX3.5 QUINCKE PK (NEEDLE) IMPLANT
NS IRRIG 1000ML POUR BTL (IV SOLUTION) ×1 IMPLANT
PACK LAMINECTOMY NEURO (CUSTOM PROCEDURE TRAY) ×1 IMPLANT
PAD ARMBOARD 7.5X6 YLW CONV (MISCELLANEOUS) ×3 IMPLANT
SOL ELECTROSURG ANTI STICK (MISCELLANEOUS) ×1
SOLUTION ELECTROSURG ANTI STCK (MISCELLANEOUS) ×1 IMPLANT
SPIKE FLUID TRANSFER (MISCELLANEOUS) ×1 IMPLANT
SPONGE SURGIFOAM ABS GEL SZ50 (HEMOSTASIS) ×1 IMPLANT
SPONGE T-LAP 4X18 ~~LOC~~+RFID (SPONGE) IMPLANT
STRIP CLOSURE SKIN 1/2X4 (GAUZE/BANDAGES/DRESSINGS) IMPLANT
SUT MNCRL AB 4-0 PS2 18 (SUTURE) ×1 IMPLANT
SUT VIC AB 0 CT1 18XCR BRD8 (SUTURE) IMPLANT
SUT VIC AB 0 CT1 8-18 (SUTURE)
SUT VIC AB 2-0 CP2 18 (SUTURE) ×1 IMPLANT
SUT VIC AB 3-0 SH 8-18 (SUTURE) ×1 IMPLANT
SYR 3ML LL SCALE MARK (SYRINGE) IMPLANT
TOWEL GREEN STERILE (TOWEL DISPOSABLE) ×1 IMPLANT
TOWEL GREEN STERILE FF (TOWEL DISPOSABLE) ×1 IMPLANT
WATER STERILE IRR 1000ML POUR (IV SOLUTION) ×1 IMPLANT

## 2022-09-13 NOTE — Op Note (Signed)
PREOP DIAGNOSIS: Left lumbar radiculopathy  POSTOP DIAGNOSIS: Left lumbar radiculopathy  PROCEDURE: 1. Left L4-5 laminotomy, medial facetectomy for excision of herniated disc using tubular retractor system 2.  Use of microscope for microdissection  SURGEON: Dr. Duffy Rhody, MD  ASSISTANT: Caroline More Please note, there were no qualified trainees available to assist with the procedure.  An assistant was required for aid in retraction of the nerve root during excision of partially calcified disc.   ANESTHESIA: General Endotracheal  EBL: 25 ml  SPECIMENS: None  DRAINS: None  COMPLICATIONS: none  CONDITION: Stable to PCAU  HISTORY: Jordan [name] is a 77 y.o. male with hx of L4-S1 PLD and AFib developed left lumbar radiculopathy refractory to home PT, yoga, ESIs and chiropractic care.  He was found  to have severe left L4-5 stenosis from combination of facet arthropathy and partially calcified disc herniation.. The patient had failed nonsurgical management.  Therefore, microdiscectomy was offered to the patient.  Risks, benefits, alternatives, and expected convalescence were discussed.  Risks discussed included, but were not limited to, bleeding, pain, infection, scar, recurrent disc, instability, CSF leak, weakness, numbness, paralysis, and death.  Informed consent was obtained and the patient wished to proceed.  PROCEDURE IN DETAIL: The patient was brought to the operating room. After induction of general anesthesia, the patient was positioned on the operative table in the prone position on a Wilson frame with all pressure points meticulously padded. The skin of the low back was then prepped and draped in the usual sterile fashion.  Under fluoroscopy, the correct levels were identified and marked out on the skin, and after timeout was conducted, the skin was infiltrated with local anesthetic.  Paramedian skin incision was then made sharply over the affected level and the  subcutaneous tissue and fascia were incised.  Initial dilator was then passed to the interspace under fluoroscopic guidance.  The paraspinous muscle attachments were dissected from the left L4 lamina using the dilators.  Successive dilators were used until a 20 mm tubular retractor was placed under fluoroscopic guidance and locked in place with an attachment arm.  Microscope was then introduced into the field.  Small amount of remaining musculature was dissected from the lamina and the interspace was visualized as well.  The medial facet was also exposed.  High-speed drill was used to perform a laminotomy as well as a modest medial facetectomy.   The facet was severely hypertrophied, extending down and contacting the disc space.  The ligamentum flavum was then removed with rongeurs.  The thecal sac and traversing nerve root were identified.  The nerve root was noted to be red and swollen.  The epidural disc space was dissected with a 4 Penfield.  Upon retracting the nerve root medially, partially calcified disc herniation was encountered.  My assistant Caroline More assisted with nerve root retraction to allow safe disc excision.  The disc herniation was incised with a 15 blade and pituitary rongeur was used to remove several sizable free disc fragments.  Smaller additional fragments which were superiorly migrated were also removed.  There was additional immobile calcified disc which was drilled and rongeured away. Following removal of the herniated disc, the nerve root appeared much more relaxed.  Additional underlying subarticular osteophyte more superiorly was also removed and left L4-5 foraminotomy was performed. Foraminotomy was performed with unroofing over the traversing nerve root out of the foramen. Ball ended nerve hook was used to ensure good decompression and there was no longer significant's impingement of  the nerve roots.  Meticulous hemostasis was obtained.  The wound was irrigated thoroughly .   Depo-Medrol was then placed over the nerve root.  The tubular retractor was then withdrawn with hemostasis in the muscle obtained with bipolar.  The muscles were injected with half percent Marcaine.  The fascia was closed with 0 Vicryl stitches.  The dermal layer was closed with 2-0 Vicryl stitches in buried interrupted fashion.  The skin was closed with 4-0 Monocryl in subcuticular manner followed by Dermabond.  A sterile dressing was placed.  Patient was then flipped supine and extubated by the anesthesia service.  All counts were correct at the end of surgery.  No complications were noted.

## 2022-09-13 NOTE — Anesthesia Procedure Notes (Addendum)
Procedure Name: Intubation Date/Time: 09/13/2022 1:44 PM  Performed by: Harden Mo, CRNAPre-anesthesia Checklist: Patient identified, Emergency Drugs available, Suction available and Patient being monitored Patient Re-evaluated:Patient Re-evaluated prior to induction Oxygen Delivery Method: Circle system utilized Preoxygenation: Pre-oxygenation with 100% oxygen Induction Type: IV induction Ventilation: Mask ventilation without difficulty Laryngoscope Size: Miller and 2 Grade View: Grade I Tube type: Oral Tube size: 7.5 mm Number of attempts: 1 Airway Equipment and Method: Stylet and Oral airway Placement Confirmation: ETT inserted through vocal cords under direct vision, positive ETCO2 and breath sounds checked- equal and bilateral Secured at: 23 cm Tube secured with: Tape Dental Injury: Teeth and Oropharynx as per pre-operative assessment  Comments: Intubated by [facility]

## 2022-09-13 NOTE — H&P (Addendum)
CC: back pain, left leg pain  HPI:     Patient is a 77 y.o. [name] with hx of L4-S1 PLD and AFib developed left lumbar radiculopathy refractory to home PT, yoga, ESIs and chiropractic care.  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    Patient Active Problem List   Diagnosis Date Noted   History of meniscal tear 10/18/2021   Hypercoagulable state due to paroxysmal atrial fibrillation 07/12/2021   OSA on CPAP 04/23/2020   Abnormal dreams 01/21/2020   Malignant neoplasm prostate 12/22/2019   Long term (current) use of anticoagulants 06/24/2015   Coronary artery disease involving native coronary artery of native heart without angina pectoris 06/24/2015   Facet degeneration of lumbar region 08/26/2012   Lumbar radiculopathy 08/26/2012   Osteoarthritis of right knee 03/26/2011   Traumatic tear of lateral meniscus of right knee 03/26/2011   Ejection fraction    Atrial fibrillation    Drug intolerance    Incomplete RBBB    Carotid bruit    Knee pain    Anxiety    hyperlipidemia    Palpitations    Benign prostatic hyperplasia 02/06/2009   Personal history of colonic adenomas 11/29/2006   Past Medical History:  Diagnosis Date   Anxiety    Mild   Atrial fibrillation    Paroxysmal   BPH (benign prostatic hyperplasia)    CAD (coronary artery disease)    Stent LAD, 2002  /  nuclear 2009, no ischemia  /    Hospital October, 2011 nuclear, no ischemia, possible slight apical scar, ejection fraction 70%   Cancer    prostate CA-non aggressive   Carotid bruit    Doppler November, 2011, normal   Chest pain 02/23/2013   Chronic anticoagulation    Low CHADS , score, but patient wants to be aggressive   Colon polyps    COVID-19    + long Covid problems   Drug intolerance    Mild beta blocker intolerance   Dyslipidemia    Ejection fraction    EF 65-70%, echo, 2007 /  EF 70%, nuclear, 2011   Hyperlipidemia    IBS (irritable bowel syndrome)    Incomplete RBBB    Knee pain     left   Personal history of colonic adenomas 11/29/2006   Qualifier: Diagnosis of  By: Marydel, [place]     Sleep apnea    Syncope    November, 2010    Past Surgical History:  Procedure Laterality Date   ATRIAL FIBRILLATION ABLATION N/A 10/25/2021   Procedure: ATRIAL FIBRILLATION ABLATION;  Surgeon: Constance Haw, MD;  Location: [facility];  Service: Cardiovascular;  Laterality: N/A;   BACK SURGERY  05/1990   L4L5S1   CARDIAC CATHETERIZATION     COLONOSCOPY  multiple   PROSTATE SURGERY     UMBILICAL HERNIA REPAIR  07/15/2021   [facility]   XI ROBOTIC ASSISTED SIMPLE PROSTATECTOMY  2022    Medications Prior to Admission  Medication Sig Dispense Refill Last Dose   apixaban (ELIQUIS) 5 MG TABS tablet TAKE  (1)  TABLET TWICE A DAY. 180 tablet 3 09/09/22   atorvastatin (LIPITOR) 80 MG tablet TAKE 1 TABLET DAILY AT 6PM 90 tablet 3 09/12/2022   Calcium Carb-Cholecalciferol (CALCIUM 600+D3 PO) Take 1 capsule by mouth daily.   Past Week   carboxymethylcellulose (REFRESH PLUS) 0.5 % SOLN Place 1 drop into both eyes 3 (three) times daily as needed (dry eyes).   Past  Week   Cholecalciferol (VITAMIN D3) 1000 UNITS CAPS Take 1,000 Units by mouth daily.   Past Week   diltiazem (CARDIZEM CD) 180 MG 24 hr capsule Take 1 capsule (180 mg total) by mouth daily. 90 capsule 3 09/13/2022 at 0700   diltiazem (CARDIZEM) 60 MG tablet Take 1 tablet (60 mg total) by mouth as needed. 90 tablet 1    GLUCOSAMINE-CHONDROITIN PO Take 1,500 mg by mouth daily.   Past Week   Multiple Vitamins-Minerals (MULTIVITAMIN WITH MINERALS) tablet Take 1 tablet by mouth daily.   Past Week   NON FORMULARY Pt uses a cpap nightly      Probiotic Product (ALIGN PO) Take 1 capsule by mouth daily.   Past Week   nitroGLYCERIN (NITROSTAT) 0.4 MG SL tablet Place 1 tablet (0.4 mg total) under the tongue every 5 (five) minutes as needed for chest pain. 25 tablet 3 More than a month   triamcinolone cream (KENALOG) 0.1 % Apply 1  application topically 2 (two) times daily. (Patient not taking: Reported on 08/31/2022) 30 g 0 More than a month   Allergies  Allergen Reactions   Levofloxacin Other (See Comments)    Couldn't breathe, passed out   Lopid [Gemfibrozil] Other (See Comments)    Loose bowels   Sulfonamide Derivatives Hives   Sulfa Antibiotics Rash    Social History   Tobacco Use   Smoking status: Never   Smokeless tobacco: Never   Tobacco comments:    Never smoke 07/27/22  Substance Use Topics   Alcohol use: No    Family History  Problem Relation Age of Onset   Heart attack Mother    Diabetes Mother    Macular degeneration Sister    Obesity Sister    Paranoid behavior Sister    Dementia Sister    Cancer Sister        lung - uterus    COPD Sister    Heart attack Brother    Heart disease Brother        cause of death heart attack   Cancer Daughter        colon    Stroke Maternal Grandfather    Stroke Paternal Grandfather    Colon cancer Neg Hx      Review of Systems Pertinent items are noted in HPI.  Objective:   Patient Vitals for the past 8 hrs:  BP Temp Pulse Resp SpO2 Height Weight  09/13/22 1151 120/Jordan 97.6 F (36.4 C) 71 18 98 % 6\' 2"  (1.88 m) 101.7 kg   No intake/output data recorded. No intake/output data recorded.      General : Alert, cooperative, no distress, appears stated age   Head:  Normocephalic/atraumatic    Eyes: PERRL, conjunctiva/corneas clear, EOM's intact. Fundi could not be visualized Neck: Supple Chest:  Respirations unlabored Chest wall: no tenderness or deformity Heart: Regular rate and rhythm Abdomen: Soft, nontender and nondistended Extremities: warm and well-perfused Skin: normal turgor, color and texture Neurologic:  Alert, oriented x 3.  Eyes open spontaneously. PERRL, EOMI, VFC, no facial droop. V1-3 intact.  No dysarthria, tongue protrusion symmetric.  CNII-XII intact. Normal strength, sensation and reflexes throughout.  No pronator drift,  full strength in legs. Positive L SLR.      Data ReviewCBC:  Lab Results  Component Value Date   WBC 8.2 09/05/2022   RBC 4.93 09/05/2022   BMP:  Lab Results  Component Value Date   GLUCOSE 94 09/05/2022   CO2 29 09/05/2022  BUN 19 09/05/2022   BUN 17 10/18/2021   CREATININE 0.98 09/05/2022   CREATININE 1.00 03/06/2013   CALCIUM 8.7 (L) 09/05/2022   Radiology review:  L L4-5 subarticular stenosis  Assessment:   Active Problems:   * No active hospital problems. *  77 yo M with Afib, hx of L4-S1 decompression who has severe left L4-5 subarticular stenosis and associated radiculopathy Plan:   -Risks, benefits, alternatives, and expected convalescence were discussed with her.  Risks discussed included, but were not limited to bleeding, pain, infection, scar, spinal fluid leak, neurologic deficit, instability, damage to nearby organs, and death.  Informed consent was obtained.  He wished to proceed with left L4-5 MIS decompression

## 2022-09-13 NOTE — Anesthesia Postprocedure Evaluation (Signed)
Anesthesia Post Note  Patient: Jordan [name]  Procedure(s) Performed: Minimally Invasive  Laminectomy, Medical Facetectomy ,Microdiscectomy Left Lumbar Four-Five (Left)     Patient location during evaluation: PACU Anesthesia Type: General Level of consciousness: awake and alert, patient cooperative and oriented Pain management: pain level controlled Vital Signs Assessment: post-procedure vital signs reviewed and stable Respiratory status: spontaneous breathing, nonlabored ventilation and respiratory function stable Cardiovascular status: blood pressure returned to baseline and stable Postop Assessment: no apparent nausea or vomiting, able to ambulate and adequate PO intake Anesthetic complications: no   No notable events documented.  Last Vitals:  Vitals:   09/13/22 1630 09/13/22 1645  BP: 121/68 124/68  Pulse: 63 65  Resp: 16 20  Temp:  36.4 C  SpO2: 96% 97%    Last Pain:  Vitals:   09/13/22 1645  PainSc: 0-No pain    LLE Motor Response: Purposeful movement (09/13/22 1645) LLE Sensation: Full sensation (09/13/22 1645) RLE Motor Response: Purposeful movement (09/13/22 1645) RLE Sensation: Full sensation (09/13/22 1645)      Jolinda Pinkstaff,E. Adrienne Delay

## 2022-09-13 NOTE — Transfer of Care (Signed)
Immediate Anesthesia Transfer of Care Note  Patient: VYTAUTAS BARBIERI  Procedure(s) Performed: Minimally Invasive  Laminectomy, Medical Facetectomy ,Microdiscectomy Left Lumbar Four-Five (Left)  Patient Location: PACU  Anesthesia Type:General  Level of Consciousness: awake, alert , oriented, and patient cooperative  Airway & Oxygen Therapy: Patient Spontanous Breathing and Patient connected to nasal cannula oxygen  Post-op Assessment: Report given to RN, Post -op Vital signs reviewed and stable, and Patient moving all extremities X 4  Post vital signs: Reviewed and stable  Last Vitals:  Vitals Value Taken Time  BP 134/66 09/13/22 1547  Temp    Pulse 63 09/13/22 1549  Resp 15 09/13/22 1549  SpO2 97 % 09/13/22 1549  Vitals shown include unvalidated device data.  Last Pain:  Vitals:   09/13/22 1200  PainSc: 6       Patients Stated Pain Goal: 4 ([id number])  Complications: No notable events documented.

## 2022-09-14 ENCOUNTER — Encounter (HOSPITAL_COMMUNITY): Payer: Self-pay | Admitting: Neurosurgery

## 2022-09-14 DIAGNOSIS — I2510 Atherosclerotic heart disease of native coronary artery without angina pectoris: Secondary | ICD-10-CM | POA: Diagnosis not present

## 2022-09-14 DIAGNOSIS — I480 Paroxysmal atrial fibrillation: Secondary | ICD-10-CM | POA: Diagnosis not present

## 2022-09-14 DIAGNOSIS — M4726 Other spondylosis with radiculopathy, lumbar region: Secondary | ICD-10-CM | POA: Diagnosis not present

## 2022-09-14 DIAGNOSIS — Z8616 Personal history of COVID-19: Secondary | ICD-10-CM | POA: Diagnosis not present

## 2022-09-14 DIAGNOSIS — Z79899 Other long term (current) drug therapy: Secondary | ICD-10-CM | POA: Diagnosis not present

## 2022-09-14 DIAGNOSIS — Z7901 Long term (current) use of anticoagulants: Secondary | ICD-10-CM | POA: Diagnosis not present

## 2022-09-14 DIAGNOSIS — Z955 Presence of coronary angioplasty implant and graft: Secondary | ICD-10-CM | POA: Diagnosis not present

## 2022-09-14 MED ORDER — HYDROCODONE-ACETAMINOPHEN 5-325 MG PO TABS: 1.0000 | ORAL_TABLET | Freq: Four times a day (QID) | ORAL | 0 refills | Status: AC | PRN

## 2022-09-14 NOTE — Discharge Instructions (Signed)
Wound Care Remove dressing in 3 days Leave incision open to air. You may shower. Do not scrub directly on incision.  Do not put any creams, lotions, or ointments on incision. Activity Walk each and every day, increasing distance each day. No lifting greater than 8 lbs.  Avoid bending, arching, and twisting. No driving for 2 weeks; may ride as a passenger locally.  Diet Resume your normal diet.   Call Your Doctor If Any of These Occur Redness, drainage, or swelling at the wound.  Temperature greater than 101 degrees. Severe pain not relieved by pain medication. Incision starts to come apart. Follow Up Appt Call [phone]) for problems.  If you have any hardware placed in your spine, you will need an x-ray before your appointment.

## 2022-09-14 NOTE — Discharge Summary (Signed)
  Discharge Summary   Date of Admission: 09/13/22   Date of Discharge: 09/14/22   Attending Physician:  Duffy Rhody, MD  Hospital Course: Patient was admitted following an uncomplicated [id number] microdiscectomy. They were recovered in PACU and transferred to [facility]. Their preop symptoms were improved. Pt to f/u in office in 2 weeks for post op visit. Pt is in agreement w/ plan.    Neurologic exam at discharge:  A&O x3 Strength 5/5 x4.  SILTx4.  Incision c/d/I.  Pt ambulating without difficulty.      Discharge diagnosis: Lumbar spondylosis with radiculopathy   Jordan [name], [facility] [place]

## 2022-09-14 NOTE — Progress Notes (Signed)
Patient alert and oriented, mae's well, voiding adequate amount of urine, swallowing without difficulty, no c/o pain at time of discharge. Patient discharged home with family. Script and discharged instructions given to patient. Patient and family stated understanding of instructions given. Patient has an appointment with Dr. Thomas ?

## 2022-09-14 NOTE — Evaluation (Signed)
Occupational Therapy Evaluation and DC Summary Patient Details Name: Jordan [name] MRN: [id number] DOB: April 07, 1946 Today's Date: 09/14/2022   History of Present Illness 77 y.o. male presented with severe left L4-5 stenosish. PMH significant  L4-S1 PLD and AFib . He is now s/p L4-L5 Laminectomy   Clinical Impression   Patient admitted for above diagnosis. PTA, patient lived with spouse and completing bADLs/iADLs independently/Mod I, functional ambulation with no AD. Patient currently completing functional mobility with supervision assist and bed mobility with  supervision. Educated patient on POB precautions and they demonstrated ability to complete bADLs/iADLs safely within precautions. Patient functioning at or near baseline. No follow-up OT recommended at this time.        Recommendations for follow up therapy are one component of a multi-disciplinary discharge planning process, led by the attending physician.  Recommendations may be updated based on patient status, additional functional criteria and insurance authorization.   Assistance Recommended at Discharge PRN  Patient can return home with the following Assist for transportation;Assistance with cooking/housework;Help with stairs or ramp for entrance    Functional Status Assessment  Patient has not had a recent decline in their functional status  Equipment Recommendations  Tub/shower seat (adjustable height shower chair)    Recommendations for Other Services       Precautions / Restrictions Precautions Precautions: Back;Fall Precaution Booklet Issued: Yes (comment) Precaution Comments: NO BLT Restrictions Weight Bearing Restrictions: No      Mobility Bed Mobility Overal bed mobility: Modified Independent             General bed mobility comments: Pt educated on log rolling, verbalized and return demonstrated understanding    Transfers Overall transfer level: Needs assistance   Transfers: Sit to/from  Stand Sit to Stand: Supervision           General transfer comment: Pt verbalized and demo'd understanding of car transfer, simulated on elevated bed      Balance Overall balance assessment: Needs assistance Sitting-balance support: Feet supported, No upper extremity supported Sitting balance-Leahy Scale: Normal     Standing balance support: During functional activity Standing balance-Leahy Scale: Good                             ADL either performed or assessed with clinical judgement   ADL Overall ADL's : Needs assistance/impaired Eating/Feeding: Independent;Sitting   Grooming: Standing;Supervision/safety   Upper Body Bathing: Supervision/ safety;Standing   Lower Body Bathing: Sitting/lateral leans;Supervison/ safety   Upper Body Dressing : Standing;Supervision/safety   Lower Body Dressing: Sitting/lateral leans;Supervision/safety   Toilet Transfer: Public affairs consultant Details (indicate cue type and reason): Pt has raised toilet at home, simulating in acute setting was harder to do with lower toilet but Pt did it within precautions Toileting- Clothing Manipulation and Hygiene: Min guard;Sit to/from stand   Tub/ Shower Transfer: Supervision/safety   Functional mobility during ADLs: Supervision/safety General ADL Comments: Used RW for initial portion of tx due to lightheadedness. Progressed to no AD Supervision     Vision Baseline Vision/History: 1 Wears glasses       Perception     Praxis      Pertinent Vitals/Pain Pain Assessment Pain Assessment: No/denies pain     Hand Dominance Right   Extremity/Trunk Assessment Upper Extremity Assessment Upper Extremity Assessment: Overall WFL for tasks assessed   Lower Extremity Assessment Lower Extremity Assessment: Overall WFL for tasks assessed   Cervical / Trunk Assessment Cervical /  Trunk Assessment: Normal   Communication Communication Communication: No  difficulties   Cognition Arousal/Alertness: Awake/alert Behavior During Therapy: WFL for tasks assessed/performed Overall Cognitive Status: Within Functional Limits for tasks assessed                                       General Comments  VSS on RA    Exercises     Shoulder Instructions      Home Living Family/patient expects to be discharged to:: Private residence Living Arrangements: Spouse/significant other Available Help at Discharge: Family;Available 24 hours/day Type of Home: House Home Access: Stairs to enter [employer] of Steps: 2 Entrance Stairs-Rails: None Home Layout: One level     Bathroom Shower/Tub: Occupational psychologist: Handicapped height (with rails) Bathroom Accessibility: Yes   Home Equipment: Hand held shower head;Grab bars - tub/shower;Grab bars - toilet;Adaptive equipment (3 leg walker?) Adaptive Equipment: Reacher        Prior Functioning/Environment Prior Level of Function : Independent/Modified Independent;Driving             Mobility Comments: Independent no AD ADLs Comments: Independent in bADLs/iADLs. Mod I for housework        OT Problem List: Pain      OT Treatment/Interventions:      OT Goals(Current goals can be found in the care plan section) Acute Rehab OT Goals Patient Stated Goal: to get home OT Goal Formulation: With patient Time For Goal Achievement: 09/28/22 Potential to Achieve Goals: Good  OT Frequency:      Co-evaluation              AM-PAC OT "6 Clicks" Daily Activity     Outcome Measure Help from another person eating meals?: None Help from another person taking care of personal grooming?: A Little Help from another person toileting, which includes using toliet, bedpan, or urinal?: A Little Help from another person bathing (including washing, rinsing, drying)?: A Little Help from another person to put on and taking off regular upper body clothing?: A  Little Help from another person to put on and taking off regular lower body clothing?: A Little 6 Click Score: 19   End of Session Equipment Utilized During Treatment: Gait belt;Rolling walker (2 wheels) Nurse Communication: Mobility status  Activity Tolerance: Patient tolerated treatment well Patient left: in bed;with family/visitor present;with call bell/phone within reach  OT Visit Diagnosis: Pain Pain - part of body:  (back)                Time: [id number] OT Time Calculation (min): 35 min Charges:  OT General Charges $OT Visit: 1 Visit OT Evaluation $OT Eval Moderate Complexity: 1 Mod OT Treatments $Self Care/Home Management : 8-22 mins  09/14/2022  AB, OTR/L  Acute Rehabilitation Services  Office: [place] 09/14/2022, 9:08 AM

## 2022-09-14 NOTE — Progress Notes (Signed)
PT Cancellation Note and Discharge  Patient Details Name: Jordan [name] MRN: [id number] DOB: 01-30-1946   Cancelled Treatment:    Reason Eval/Treat Not Completed: PT screened, no needs identified, will sign off. Discussed pt case with OT who reports pt is currently mobilizing without assistance and does not require a formal PT evaluation at this time. PT signing off. If needs change, please reconsult.     Thelma Comp 09/14/2022, 9:30 AM  Rolinda Roan, PT, DPT Acute Rehabilitation Services Secure Chat Preferred Office: [phone]

## 2022-10-09 ENCOUNTER — Other Ambulatory Visit: Payer: Self-pay | Admitting: Family Medicine

## 2022-10-09 DIAGNOSIS — E782 Mixed hyperlipidemia: Secondary | ICD-10-CM

## 2022-10-09 DIAGNOSIS — I480 Paroxysmal atrial fibrillation: Secondary | ICD-10-CM

## 2022-10-10 ENCOUNTER — Other Ambulatory Visit: Payer: Self-pay | Admitting: Family Medicine

## 2022-10-10 DIAGNOSIS — E782 Mixed hyperlipidemia: Secondary | ICD-10-CM

## 2022-10-10 DIAGNOSIS — I480 Paroxysmal atrial fibrillation: Secondary | ICD-10-CM

## 2022-10-16 ENCOUNTER — Encounter: Payer: Self-pay | Admitting: Neurology

## 2022-10-16 ENCOUNTER — Ambulatory Visit: Payer: Medicare Other | Admitting: Neurology

## 2022-10-16 ENCOUNTER — Ambulatory Visit: Payer: Medicare Other | Admitting: Family Medicine

## 2022-10-16 VITALS — BP 101/65 | HR 68 | Ht 74.0 in | Wt 227.7 lb

## 2022-10-16 DIAGNOSIS — G4733 Obstructive sleep apnea (adult) (pediatric): Secondary | ICD-10-CM | POA: Diagnosis not present

## 2022-10-16 DIAGNOSIS — M5440 Lumbago with sciatica, unspecified side: Secondary | ICD-10-CM | POA: Insufficient documentation

## 2022-10-16 DIAGNOSIS — I480 Paroxysmal atrial fibrillation: Secondary | ICD-10-CM

## 2022-10-16 NOTE — Patient Instructions (Signed)
Safe Surgery and Sleep Apnea Sleep apnea is a condition in which breathing pauses or becomes shallow during sleep. Most people with the condition are not aware that they have it. Your health care providers need to know whether or not you have sleep apnea, especially if you are having surgery. Sleep apnea can increase your risk of complications during and after surgery. Tell a health care provider about: Any medical conditions you have, especially if you have sleep apnea. Any allergies you have. All medicines you are taking, including vitamins, herbs, eye drops, creams, and over-the-counter medicines. Any blood disorders you have. Any problems you or family members have had with anesthetic medicines. Any surgeries you have had. Whether you are pregnant or may be pregnant. What are the risks of having surgery? Untreated sleep apnea increases the risk for certain complications during and after surgery. This is because when you have sleep apnea, your airways are more sensitive to medicines used during surgery. The airways can collapse and block the flow of air.  Having untreated sleep apnea can increase your risk for: A longer stay in the recovery room or hospital. Breathing difficulties such as low oxygen levels after surgery. Increased pain after surgery. Irregular heart rhythms. Stroke. Heart attack. You and your health care provider can take steps to help prevent these and other complications. What happens before the surgery? Sleep apnea screening Sleep apnea screening is a series of questions that determine if you are at risk for sleep apnea. Before you have surgery, get screened for sleep apnea and talk with your surgeon and primary health care provider about your results. Screening usually involves answering questions about your sleep quality. Ask your health care provider if you can be screened, or take a screening test yourself. You can find these tests online at the American Sleep Apnea  Association website. Some questions you may be asked include: Do you snore? Is your sleep restless? Do you have daytime sleepiness? Has a partner or spouse told you that you stop breathing during sleep? Have you had trouble concentrating or memory loss? Answer these questions honestly. If a screening test is positive, this means you are at risk for the condition. Further testing may be needed to confirm a diagnosis of sleep apnea. General instructions Talk to your health care provider about your individual risks based on your screening results and the type of surgery you will be having. If you have a sleep apnea device (positive airway pressure device), wear it as prescribed. If you have not been wearing your device, talk with your health care provider about why you have not been wearing it. There are ways to improve your use of the device, such as: Adjusting the mask. Adding humidified air. Getting treatment for nasal congestion. Do not use any products that contain nicotine or tobacco. These products include cigarettes, chewing tobacco, and vaping devices, such as e-cigarettes. If you need help quitting, ask your health care provider. Do not drink alcohol the day before or after your surgery because it can worsen your sleep apnea. What happens on the day of surgery? If told by your health care provider, bring your sleep apnea device with you. Wear your sleep apnea device when you are sleeping during your hospital stay, or as told by your health care provider. Ask your health care provider what special considerations will be taken during and after your surgery. What can I expect after the surgery? You may need to be given extra oxygen and wear a continuous   oxygen monitor (pulse oximetry). For your safety, you may need to stay in the recovery room or hospital for longer than usual. Follow these instructions at home: Medicines Avoid using sleep medicines unless they are prescribed by a health  care provider who is aware of the results of your sleep apnea screening. Avoid using sleep medicines while taking opioid pain medicine. Limit your use of opioid pain medicines as much as possible. Ask your health care provider what is a safe amount to use. Ask about using pain medicines that do not affect your breathing, such as NSAIDs or acetaminophen. General instructions  If your health care provider approves, raise the head of your bed or lie on your side. Do not lie flat on your back. Follow instructions from your health care provider about wearing your sleep device: Anytime you are sleeping, including during daytime naps. While taking prescription pain medicines, sleeping medicines, or medicines that make you drowsy. If you will be going home right after the procedure, plan to have a responsible adult care for you for the time you are told. This is important. Where to find more information For more information about sleep apnea screening and healthy sleep, visit these websites: Centers for Disease Control and Prevention: www.cdc.gov American Sleep Apnea Association: www.sleepapnea.org Contact a health care provider if: You have sleep apnea or think you may be at risk for sleep apnea, and you are scheduled for surgery. Get help right away if: You have trouble breathing. You are very drowsy and cannot stay awake. You are told that you have pauses in your breathing during sleep after surgery. You have chest pain. You have a fast heartbeat. These symptoms may represent a serious problem that is an emergency. Do not wait to see if the symptoms will go away. Get medical help right away. Call your local emergency services (911 in the [place] Do not drive yourself to the hospital. Summary Your health care providers need to know whether or not you have sleep apnea, especially if you are having surgery. If you have sleep apnea, you are at an increased risk for complications during surgery. You  and your health care provider can take precautions to help prevent complications. If you have sleep apnea, make sure to tell your health care provider and anesthesia specialist. This information is not intended to replace advice given to you by your health care provider. Make sure you discuss any questions you have with your health care provider. Document Revised: 05/07/2020 Document Reviewed: 05/07/2020 Elsevier Patient Education  2023 [employer]  

## 2022-10-16 NOTE — Progress Notes (Signed)
Provider:  Melvyn Novas, MD  Primary Care Physician:  Raliegh Ip, DO [address] [place] [address]     Referring Provider: Jeronimo Greaves [address] [place],  [place] [address]          Chief Complaint according to patient   Patient presents with:     New Patient (Initial Visit)           HISTORY OF PRESENT ILLNESS:  Jordan [name] is a 77 y.o. male patient who is here for revisit 10/16/2022 .  Chief concern according to patient :  I just have been in back pain until surgery April 3rd, now still in some pain, but much better than v before- he is happy with the pain relief.  Had an ablation for atrial fib in 10-2021, He can't take NSAIDS due to his blood thinners, and he remembers after prostate surgery he couldn't take NSAIDS and had some oxycodone.  He has had some extra apneas. He gained form 209 to 227 pounds in the meantime. He is sleeping more on his back, due to  back pain and he is avoiding prone sleep.  .     10-12-2021.   Jordan [name] is a 77 y.o. year old Caucasian male patient seen here  in the SLEEP CLINIC upon recommendation of cardiology, Underwent sleep study in September 2021, very mild apnea diagnosed-  Had chronic viral fatigue- Patient with mild OSA - seen here because of comorbididties- atrial fibrillation, hypercholesterolemia, CAD, and small , crowded jaw-  The patient is predisposed to OSA, he snored.   Post Covid, he experienced a change in sleep duration, in daytime sleepiness, and in dream pressure. He remained with excessive daytime sleepiness. He remained fatigued and with tinnitus.     Now on CPAP, had no further atrial fibrillation since using CPAP - he is using FFM and 100 % compliance is less sleepy and less fatigued.  He is getting more dream sleep- REM sleep and feels rested, he is more forgetful than before COVID but much better than after he recovered form the acute viral illness.  He feels rested. [place]  [place]. He is concerned about the automatic refills, and the costs for supplies he didn't feel he needed. He wants to get those form a local supplier rathe .  He also wonders about the  pressure- and indeed he straddled the 95% 11.8 cm water - and the upper setting at 12 cm is too tight for him. Will increase the setting to 14 cm. I will ask adapt to change this setting.    He provides excellent compliance: 100% with residual AHI of 8.1/h. obstructive apnea 5.2/h- needs increase in pressure, 95% is 11.8 cm water.      Outcome quoted here :Summary & Diagnosis:    There is very mild sleep apnea noted here- the AHI is only 5.5/h,  with NREM AHI of only 3.7/h and REM AHI of 11.7/h. there is no  clinically relevant  hypoxia associated and the heart rate trend  is towards bradycardia.      Recommendations: Given the very high daytime sleepiness score of Epworth 18, I  need to first treat sleep apnea, however mild, before pursuing a  narcolepsy evaluation. The patient can use a dental device or  CPAP for at least 60 days before we re-evaluate his sleepiness  Review of Systems: Out of a complete 14 system review, the patient complains of only the following symptoms, and all other reviewed systems are negative.:  Fatigue, sleepiness , snoring,  Dysphonia.    How likely are you to doze in the following situations: 0 = not likely, 1 = slight chance, 2 = moderate chance, 3 = high chance   Sitting and Reading? Watching Television? Sitting inactive in a public place (theater or meeting)? As a passenger in a car for an hour without a break? Lying down in the afternoon when circumstances permit? Sitting and talking to someone? Sitting quietly after lunch without alcohol? In a car, while stopped for a few minutes in traffic?   Total = 6/ 24 points   FSS endorsed at 24/ 63 points.   Social History   Socioeconomic History   Marital status: Married    Spouse name:  [name]    Number of children: 3   Years of education: bachelors   Highest education level: Not on file  Occupational History   Occupation: retired    Comment: Advice worker [employer]  Tobacco Use   Smoking status: Never   Smokeless tobacco: Never   Tobacco comments:    Never smoke 07/27/22  Vaping Use   Vaping Use: Never used  Substance and Sexual Activity   Alcohol use: No   Drug use: No   Sexual activity: Yes  Other Topics Concern   Not on file  Social History Narrative   Married to [place], he has children and grandchildren.   [place], raises chickens, retired Copywriter, advertising    Financially cares for his disabled nephew and recently started caring for his disabled sister who resides in a nursing facility in [place]   Social Determinants of Health   Financial Resource Strain: Low Risk  (11/21/2021)   Overall Financial Resource Strain (CARDIA)    Difficulty of Paying Living Expenses: Not hard at all  Food Insecurity: No Food Insecurity (11/21/2021)   Hunger Vital Sign    Worried About Running Out of Food in the Last Year: Never true    Ran Out of Food in the Last Year: Never true  Transportation Needs: No Transportation Needs (11/21/2021)   PRAPARE - Administrator, Civil Service (Medical): No    Lack of Transportation (Non-Medical): No  Physical Activity: Sufficiently Active (11/21/2021)   Exercise Vital Sign    Days of Exercise per Week: 7 days    Minutes of Exercise per Session: 60 min  Stress: No Stress Concern Present (11/21/2021)   [employer] of Occupational Health - Occupational Stress Questionnaire    Feeling of Stress : Not at all  Social Connections: Socially Integrated (11/21/2021)   Social Connection and Isolation Panel [NHANES]    Frequency of Communication with Friends and Family: More than three times a week    Frequency of Social Gatherings with Friends and Family: More than three times a week    Attends Religious  Services: More than 4 times per year    Active Member of [employer] or Organizations: Yes    Attends Engineer, structural: More than 4 times per year    Marital Status: Married    Family History  Problem Relation Age of Onset   Heart attack Mother    Diabetes Mother    Macular degeneration Sister    Obesity Sister    Paranoid behavior Sister    Dementia Sister    Cancer  Sister        lung - uterus    COPD Sister    Heart attack Brother    Heart disease Brother        cause of death heart attack   Cancer Daughter        colon    Stroke Maternal Grandfather    Stroke Paternal Grandfather    Colon cancer Neg Hx     Past Medical History:  Diagnosis Date   Anxiety    Mild   Atrial fibrillation (HCC)    Paroxysmal   BPH (benign prostatic hyperplasia)    CAD (coronary artery disease)    Stent LAD, 2002  /  nuclear 2009, no ischemia  /    Hospital October, 2011 nuclear, no ischemia, possible slight apical scar, ejection fraction 70%   Cancer (HCC)    prostate CA-non aggressive   Carotid bruit    Doppler November, 2011, normal   Chest pain 02/23/2013   Chronic anticoagulation    Low CHADS , score, but patient wants to be aggressive   Colon polyps    COVID-19    + long Covid problems   Drug intolerance    Mild beta blocker intolerance   Dyslipidemia    Ejection fraction    EF 65-70%, echo, 2007 /  EF 70%, nuclear, 2011   Hyperlipidemia    IBS (irritable bowel syndrome)    Incomplete RBBB    Knee pain    left   Personal history of colonic adenomas 11/29/2006   Qualifier: Diagnosis of  By: Genelle Gather CMA, [place]     Sleep apnea    Syncope    November, 2010    Past Surgical History:  Procedure Laterality Date   ATRIAL FIBRILLATION ABLATION N/A 10/25/2021   Procedure: ATRIAL FIBRILLATION ABLATION;  Surgeon: Regan Lemming, MD;  Location: [facility];  Service: Cardiovascular;  Laterality: N/A;   BACK SURGERY  05/1990   [id number]   CARDIAC  CATHETERIZATION     COLONOSCOPY  multiple   LUMBAR LAMINECTOMY/ DECOMPRESSION WITH MET-RX Left 09/13/2022   Procedure: Minimally Invasive  Laminectomy, Medical Facetectomy ,Microdiscectomy Left Lumbar Four-Five;  Surgeon: Bedelia Person, MD;  Location: [facility] OR;  Service: Neurosurgery;  Laterality: Left;  3C   PROSTATE SURGERY     UMBILICAL HERNIA REPAIR  07/15/2021   [facility]   XI ROBOTIC ASSISTED SIMPLE PROSTATECTOMY  2022     Current Outpatient Medications on File Prior to Visit  Medication Sig Dispense Refill   apixaban (ELIQUIS) 5 MG TABS tablet TAKE  (1)  TABLET TWICE A DAY. 180 tablet 3   atorvastatin (LIPITOR) 80 MG tablet TAKE ONE TABLET ONCE DAILY AT 6PM 90 tablet 0   Calcium Carb-Cholecalciferol (CALCIUM 600+D3 PO) Take 1 capsule by mouth daily.     carboxymethylcellulose (REFRESH PLUS) 0.5 % SOLN Place 1 drop into both eyes 3 (three) times daily as needed (dry eyes).     Cholecalciferol (VITAMIN D3) 1000 UNITS CAPS Take 1,000 Units by mouth daily.     diltiazem (CARDIZEM CD) 180 MG 24 hr capsule TAKE ONE CAPSULE ONCE DAILY 90 capsule 0   diltiazem (CARDIZEM) 60 MG tablet Take 1 tablet (60 mg total) by mouth as needed. 90 tablet 1   GLUCOSAMINE-CHONDROITIN PO Take 1,500 mg by mouth daily.     Multiple Vitamins-Minerals (MULTIVITAMIN WITH MINERALS) tablet Take 1 tablet by mouth daily.     nitroGLYCERIN (NITROSTAT) 0.4 MG SL tablet Place 1 tablet (  0.4 mg total) under the tongue every 5 (five) minutes as needed for chest pain. 25 tablet 3   NON FORMULARY Pt uses a cpap nightly     Probiotic Product (ALIGN PO) Take 1 capsule by mouth daily.     triamcinolone cream (KENALOG) 0.1 % Apply 1 application topically 2 (two) times daily. (Patient not taking: Reported on 08/31/2022) 30 g 0   No current facility-administered medications on file prior to visit.    Allergies  Allergen Reactions   Levofloxacin Other (See Comments)    Couldn't breathe, passed out   Lopid [Gemfibrozil] Other (See  Comments)    Loose bowels   Sulfonamide Derivatives Hives   Sulfa Antibiotics Rash     DIAGNOSTIC DATA (LABS, IMAGING, TESTING) - I reviewed patient records, labs, notes, testing and imaging myself where available.  Lab Results  Component Value Date   WBC 8.2 09/05/2022   HGB 14.8 09/05/2022   HCT 45.4 09/05/2022   MCV 92.1 09/05/2022   PLT 205 09/05/2022      Component Value Date/Time   NA 139 09/05/2022 1038   NA 142 10/18/2021 0829   K 4.1 09/05/2022 1038   CL 104 09/05/2022 1038   CO2 29 09/05/2022 1038   GLUCOSE 94 09/05/2022 1038   BUN 19 09/05/2022 1038   BUN 17 10/18/2021 0829   CREATININE 0.98 09/05/2022 1038   CREATININE 1.00 03/06/2013 0821   CALCIUM 8.7 (L) 09/05/2022 1038   PROT 6.2 10/18/2021 0829   ALBUMIN 3.8 10/18/2021 0829   AST 25 10/18/2021 0829   ALT 28 10/18/2021 0829   ALKPHOS 67 10/18/2021 0829   BILITOT 0.9 10/18/2021 0829   GFRNONAA >60 09/05/2022 1038   GFRNONAA 79 03/06/2013 0821   GFRAA 82 12/22/2019 1115   GFRAA >89 03/06/2013 0821   Lab Results  Component Value Date   CHOL 133 10/18/2021   HDL 43 10/18/2021   LDLCALC 77 10/18/2021   TRIG 64 10/18/2021   CHOLHDL 3.1 10/18/2021   Lab Results  Component Value Date   HGBA1C 5.6 01/19/2021   Lab Results  Component Value Date   VITAMINB12 797 01/19/2021   Lab Results  Component Value Date   TSH 2.380 10/18/2021    PHYSICAL EXAM:  Today's Vitals   10/16/22 1027  BP: 101/65  Pulse: 68  Weight: 227 lb 11.2 oz (103.3 kg)  Height: 6\' 2"  (1.88 m)   Body mass index is 29.23 kg/m.   Wt Readings from Last 3 Encounters:  10/16/22 227 lb 11.2 oz (103.3 kg)  09/13/22 224 lb 3.2 oz (101.7 kg)  09/05/22 224 lb 3.2 oz (101.7 kg)     Ht Readings from Last 3 Encounters:  10/16/22 6\' 2"  (1.88 m)  09/13/22 6\' 2"  (1.88 m)  09/05/22 6\' 2"  (1.88 m)      General:  The patient is awake, alert and appears not in acute distress. The patient is well groomed. Head: Normocephalic,  atraumatic. Neck is supple.  Mallampati: 2 , crowded lower jaw, small mouth-  neck circumference: 16.0 inches . Nasal airflow  patent. ( he uses dental bruxism device and nasal strips.)  Dental status: intact.  Cardiovascular:  Regular rate and cardiac rhythm by pulse, without distended neck veins. Respiratory: Lungs are clear to auscultation.  Skin:  Without evidence of ankle edema, or rash. Trunk: The patient's posture is erect.   Neurologic exam : The patient is awake and alert, oriented to place and time.   Memory subjective  described as intact.  Attention span & concentration ability appears normal.  Speech is fluent,  without  dysarthria, dysphonia or aphasia.  Mood and affect are appropriate.   Cranial nerves: no loss of smell or taste reported  Pupils are equal and briskly reactive to light. Funduscopic exam  - beginning cataract right lens only.  Extraocular movements in vertical and horizontal planes were intact and without nystagmus. No Diplopia. Visual fields by finger perimetry are intact. Hearing was intact to soft voice and finger rubbing. Facial sensation intact to fine touch.  Facial motor strength is symmetric and tongue and uvula move midline.  Neck ROM : rotation, tilt and flexion extension were normal for age and shoulder shrug was symmetrical.     Gait and station: Patient could rise unassisted from a seated position, walked without assistive device.  Stance is of normal width/ base and the patient turned with 3 steps.  Toe and heel walk were deferred.  Deep tendon reflexes: deferred.       ASSESSMENT AND PLAN 77 y.o. year old male  here with:    1) known obstructive sleep apnea diagnosis, diagnosis was made at that time and the patient had atrial fibrillation.  He has meanwhile undergone ablation and is now as far as he knows free of atrial fibs spells.  He has been using CPAP compliantly and initially was excessively daytime sleepy but currently is not.  His  CPAP pressure however may need to be changed.  In spite of the excellent compliance he has a residual AHI of 10.4/h and this is not due to air leakage.  There is virtually no air leak.    The 95th percentile pressure is 11.8 cm water and he never needed that much pressure before I attributes this partially to back pain and having to lie in the splits in the sleep position that may.  No apnea supine and also to some weight gain close to 20 pounds.    So his current machine is set between 5 and 12 cmH2O and he is for 95% of the time at 12 cm water I need to increase the maximum pressure by 2 or 3 cm.  I will leave the EPR level.  The mask is fitting very well his 95th percentile air leakage is 0.5 L/min this is a rare finding such a low air leakage.   I plan to follow up either personally or through our NP within 12 months.   I would like to thank Raliegh Ip, DO and Raliegh Ip, Do [address] [place],  [place] [address] for allowing me to meet with and to take care of this pleasant patient.   CC: I will share my notes with PCP.  After spending a total time of  30  minutes face to face and additional time for physical and neurologic examination, review of laboratory studies,  personal review of imaging studies, reports and results of other testing and review of referral information / records as far as provided in visit,   Electronically signed by: Melvyn Novas, MD 10/16/2022 10:54 AM  [facility] Board certified by The [employer] of Sleep Medicine and Diplomate of the [employer] of Sleep Medicine. Board certified In Neurology through the ABPN, Fellow of the [employer] of Neurology. Medical Director of [facility].

## 2022-10-17 ENCOUNTER — Telehealth: Payer: Self-pay

## 2022-10-18 ENCOUNTER — Ambulatory Visit (INDEPENDENT_AMBULATORY_CARE_PROVIDER_SITE_OTHER): Payer: Medicare Other | Admitting: Family Medicine

## 2022-10-18 ENCOUNTER — Encounter: Payer: Self-pay | Admitting: Family Medicine

## 2022-10-18 VITALS — BP 106/69 | HR 71 | Temp 98.3°F | Ht 74.0 in | Wt 222.0 lb

## 2022-10-18 DIAGNOSIS — D485 Neoplasm of uncertain behavior of skin: Secondary | ICD-10-CM | POA: Diagnosis not present

## 2022-10-18 DIAGNOSIS — E782 Mixed hyperlipidemia: Secondary | ICD-10-CM

## 2022-10-18 DIAGNOSIS — I480 Paroxysmal atrial fibrillation: Secondary | ICD-10-CM

## 2022-10-18 DIAGNOSIS — H6191 Disorder of right external ear, unspecified: Secondary | ICD-10-CM | POA: Diagnosis not present

## 2022-10-18 DIAGNOSIS — Z6379 Other stressful life events affecting family and household: Secondary | ICD-10-CM

## 2022-10-18 DIAGNOSIS — D6869 Other thrombophilia: Secondary | ICD-10-CM | POA: Diagnosis not present

## 2022-10-18 DIAGNOSIS — Z91038 Other insect allergy status: Secondary | ICD-10-CM | POA: Diagnosis not present

## 2022-10-18 DIAGNOSIS — E785 Hyperlipidemia, unspecified: Secondary | ICD-10-CM | POA: Diagnosis not present

## 2022-10-18 MED ORDER — ATORVASTATIN CALCIUM 80 MG PO TABS
ORAL_TABLET | ORAL | 3 refills | Status: DC
Start: 2022-10-18 — End: 2023-10-24

## 2022-10-18 MED ORDER — DILTIAZEM HCL 60 MG PO TABS
60.0000 mg | ORAL_TABLET | ORAL | 3 refills | Status: DC | PRN
Start: 2022-10-18 — End: 2023-10-24

## 2022-10-18 MED ORDER — APIXABAN 5 MG PO TABS: ORAL_TABLET | ORAL | 3 refills | Status: DC

## 2022-10-18 MED ORDER — NITROGLYCERIN 0.4 MG SL SUBL: 0.4000 mg | SUBLINGUAL_TABLET | SUBLINGUAL | 3 refills | Status: DC | PRN

## 2022-10-18 MED ORDER — TRIAMCINOLONE ACETONIDE 0.1 % EX CREA: 1.0000 | TOPICAL_CREAM | Freq: Two times a day (BID) | CUTANEOUS | 0 refills | Status: DC

## 2022-10-18 MED ORDER — DILTIAZEM HCL ER COATED BEADS 180 MG PO CP24: 180.0000 mg | ORAL_CAPSULE | Freq: Every day | ORAL | 3 refills | Status: DC

## 2022-10-18 NOTE — Progress Notes (Signed)
Subjective: CC: Chronic follow-up PCP: Raliegh Ip, DO [id number] Jordan [name] is a 77 y.o. male presenting to clinic today for:  1.  Atrial fibrillation Patient reports compliance with Cardizem, Eliquis.  No reports of bleeding.  No reports of chest pain, shortness of breath.  Also needs refills on his cholesterol medication and nitroglycerin.  He should be seeing Dr. Maisie Fus for his surgery follow-up.  He is hoping that he will be able to get back into some of the activities he enjoys but he is just grateful that he is able to walk again.  2.  Skin lesion Patient reports a skin lesion on the right ear that he would like me to have a look at.  He notes that it crusts and he scratches that scab off but it continues to recur  ROS: Per HPI  Allergies  Allergen Reactions   Levofloxacin Other (See Comments)    Couldn't breathe, passed out   Lopid [Gemfibrozil] Other (See Comments)    Loose bowels   Sulfonamide Derivatives Hives   Sulfa Antibiotics Rash   Past Medical History:  Diagnosis Date   Anxiety    Mild   Atrial fibrillation (HCC)    Paroxysmal   BPH (benign prostatic hyperplasia)    CAD (coronary artery disease)    Stent LAD, 2002  /  nuclear 2009, no ischemia  /    Hospital October, 2011 nuclear, no ischemia, possible slight apical scar, ejection fraction 70%   Cancer (HCC)    prostate CA-non aggressive   Carotid bruit    Doppler November, 2011, normal   Chest pain 02/23/2013   Chronic anticoagulation    Low CHADS , score, but patient wants to be aggressive   Colon polyps    COVID-19    + long Covid problems   Drug intolerance    Mild beta blocker intolerance   Dyslipidemia    Ejection fraction    EF 65-70%, echo, 2007 /  EF 70%, nuclear, 2011   Hyperlipidemia    IBS (irritable bowel syndrome)    Incomplete RBBB    Knee pain    left   Personal history of colonic adenomas 11/29/2006   Qualifier: Diagnosis of  By: Genelle Gather CMA, [place]     Sleep apnea     Syncope    November, 2010    Current Outpatient Medications:    apixaban (ELIQUIS) 5 MG TABS tablet, TAKE  (1)  TABLET TWICE A DAY., Disp: 180 tablet, Rfl: 3   atorvastatin (LIPITOR) 80 MG tablet, TAKE ONE TABLET ONCE DAILY AT 6PM, Disp: 90 tablet, Rfl: 0   Calcium Carb-Cholecalciferol (CALCIUM 600+D3 PO), Take 1 capsule by mouth daily., Disp: , Rfl:    carboxymethylcellulose (REFRESH PLUS) 0.5 % SOLN, Place 1 drop into both eyes 3 (three) times daily as needed (dry eyes)., Disp: , Rfl:    Cholecalciferol (VITAMIN D3) 1000 UNITS CAPS, Take 1,000 Units by mouth daily., Disp: , Rfl:    diltiazem (CARDIZEM CD) 180 MG 24 hr capsule, TAKE ONE CAPSULE ONCE DAILY, Disp: 90 capsule, Rfl: 0   diltiazem (CARDIZEM) 60 MG tablet, Take 1 tablet (60 mg total) by mouth as needed., Disp: 90 tablet, Rfl: 1   GLUCOSAMINE-CHONDROITIN PO, Take 1,500 mg by mouth daily., Disp: , Rfl:    Multiple Vitamins-Minerals (MULTIVITAMIN WITH MINERALS) tablet, Take 1 tablet by mouth daily., Disp: , Rfl:    nitroGLYCERIN (NITROSTAT) 0.4 MG SL tablet, Place 1 tablet (0.4 mg total) under the tongue  every 5 (five) minutes as needed for chest pain., Disp: 25 tablet, Rfl: 3   NON FORMULARY, Pt uses a cpap nightly, Disp: , Rfl:    Probiotic Product (ALIGN PO), Take 1 capsule by mouth daily., Disp: , Rfl:    triamcinolone cream (KENALOG) 0.1 %, Apply 1 application topically 2 (two) times daily., Disp: 30 g, Rfl: 0 Social History   Socioeconomic History   Marital status: Married    Spouse name: [name]    Number of children: 3   Years of education: bachelors   Highest education level: Bachelor's degree (e.g., BA, AB, BS)  Occupational History   Occupation: retired    Comment: [employer] worker [employer]  Tobacco Use   Smoking status: Never   Smokeless tobacco: Never   Tobacco comments:    Never smoke 07/27/22  Vaping Use   Vaping Use: Never used  Substance and Sexual Activity   Alcohol use: No   Drug use: No    Sexual activity: Yes  Other Topics Concern   Not on file  Social History Narrative   Married to [place], he has children and grandchildren.   Lives [place] acres, raises chickens, retired Copywriter, advertising    Financially cares for his disabled nephew and recently started caring for his disabled sister who resides in a nursing facility in [place]   Social Determinants of Health   Financial Resource Strain: Low Risk  (10/17/2022)   Overall Financial Resource Strain (CARDIA)    Difficulty of Paying Living Expenses: Not hard at all  Food Insecurity: No Food Insecurity (10/17/2022)   Hunger Vital Sign    Worried About Running Out of Food in the Last Year: Never true    Ran Out of Food in the Last Year: Never true  Transportation Needs: No Transportation Needs (10/17/2022)   PRAPARE - Administrator, Civil Service (Medical): No    Lack of Transportation (Non-Medical): No  Physical Activity: Sufficiently Active (10/17/2022)   Exercise Vital Sign    Days of Exercise per Week: 6 days    Minutes of Exercise per Session: 40 min  Stress: No Stress Concern Present (10/17/2022)   [employer] of Occupational Health - Occupational Stress Questionnaire    Feeling of Stress : Not at all  Social Connections: Socially Integrated (10/17/2022)   Social Connection and Isolation Panel [NHANES]    Frequency of Communication with Friends and Family: More than three times a week    Frequency of Social Gatherings with Friends and Family: Twice a week    Attends Religious Services: More than 4 times per year    Active Member of [employer] or Organizations: Yes    Attends Engineer, structural: More than 4 times per year    Marital Status: Married  Catering manager Violence: Not At Risk (11/21/2021)   Humiliation, Afraid, Rape, and Kick questionnaire    Fear of Current or Ex-Partner: No    Emotionally Abused: No    Physically Abused: No    Sexually Abused: No   Family History  Problem Relation Age  of Onset   Heart attack Mother    Diabetes Mother    Macular degeneration Sister    Obesity Sister    Paranoid behavior Sister    Dementia Sister    Cancer Sister        lung - uterus    COPD Sister    Heart attack Brother    Heart disease Brother  cause of death heart attack   Cancer Daughter        colon    Stroke Maternal Grandfather    Stroke Paternal Grandfather    Colon cancer Neg Hx     Objective: Office vital signs reviewed. BP 106/69   Pulse 71   Temp 98.3 F (36.8 C)   Ht 6\' 2"  (1.88 m)   Wt 222 lb (100.7 kg)   SpO2 93%   BMI 28.50 kg/m   Physical Examination:  General: Awake, alert, well nourished, No acute distress HEENT: sclera white, MMM Cardio: Irregularly irregular with rate controlled S1S2 heard, no murmurs appreciated Pulm: clear to auscultation bilaterally, no wheezes, rhonchi or rales; normal work of breathing on room air Skin: right helix of ear with pearlescent skin lesion with central umbillication  Cryotherapy Procedure:  Risks and benefits of procedure were reviewed with the patient.  Verbal consent obtained. Lesion of concern was identified and located on right helix.  Liquid nitrogen was applied to area of concern and extending out 1 millimeters beyond the border of the lesion.  Treated area was allowed to come back to room temperature before treating it a second time.  Patient tolerated procedure well and there were no immediate complications.  Home care instructions were reviewed with the patient and a handout was provided.   Assessment/ Plan: 77 y.o. male   Paroxysmal atrial fibrillation (HCC) - Plan: Lipid Panel, TSH, CMP14+EGFR, Magnesium, diltiazem (CARDIZEM) 60 MG tablet, diltiazem (CARDIZEM CD) 180 MG 24 hr capsule, apixaban (ELIQUIS) 5 MG TABS tablet  Hypercoagulable state due to paroxysmal atrial fibrillation (HCC)  Stress due to illness of family member  Mixed hyperlipidemia - Plan: atorvastatin (LIPITOR) 80 MG  tablet  Allergic reaction to insect bite - Plan: triamcinolone cream (KENALOG) 0.1 %  Skin lesion of right external ear  I renewed his medications for his atrial fibrillation.  The lesion on his ear was treated with cryoablation today.  He may follow-up in 4 to 6 months, sooner if concerns arise   No orders of the defined types were placed in this encounter.  No orders of the defined types were placed in this encounter.    Raliegh Ip, DO [facility] [phone]

## 2022-10-19 LAB — LIPID PANEL
Chol/HDL Ratio: 3.6 ratio (ref 0.0–5.0)
Cholesterol, Total: 123 mg/dL (ref 100–199)
HDL: 34 mg/dL — ABNORMAL LOW (ref >39)
LDL Chol Calc (NIH): 72 mg/dL (ref 0–99)
Triglycerides: 86 mg/dL (ref 0–149)
VLDL Cholesterol Cal: 17 mg/dL (ref 5–40)

## 2022-10-19 LAB — CMP14+EGFR
ALT: 25 IU/L (ref 0–44)
AST: 27 IU/L (ref 0–40)
Albumin/Globulin Ratio: 1.4 (ref 1.2–2.2)
Albumin: 3.8 g/dL (ref 3.8–4.8)
Alkaline Phosphatase: 78 IU/L (ref 44–121)
BUN/Creatinine Ratio: 23 (ref 10–24)
BUN: 22 mg/dL (ref 8–27)
Bilirubin Total: 0.8 mg/dL (ref 0.0–1.2)
CO2: 23 mmol/L (ref 20–29)
Calcium: 9 mg/dL (ref 8.6–10.2)
Chloride: 102 mmol/L (ref 96–106)
Creatinine, Ser: 0.97 mg/dL (ref 0.76–1.27)
Globulin, Total: 2.8 g/dL (ref 1.5–4.5)
Glucose: 90 mg/dL (ref 70–99)
Potassium: 4.3 mmol/L (ref 3.5–5.2)
Sodium: 140 mmol/L (ref 134–144)
Total Protein: 6.6 g/dL (ref 6.0–8.5)
eGFR: 81 mL/min/{1.73_m2} (ref >59)

## 2022-10-19 LAB — MAGNESIUM: Magnesium: 2 mg/dL (ref 1.6–2.3)

## 2022-10-19 LAB — TSH: TSH: 2.16 u[IU]/mL (ref 0.450–4.500)

## 2022-11-01 ENCOUNTER — Other Ambulatory Visit: Payer: Self-pay | Admitting: Family Medicine

## 2022-11-01 DIAGNOSIS — J301 Allergic rhinitis due to pollen: Secondary | ICD-10-CM

## 2022-11-01 MED ORDER — FLUTICASONE PROPIONATE 50 MCG/ACT NA SUSP
2.0000 | Freq: Every day | NASAL | 3 refills | Status: DC
Start: 2022-11-01 — End: 2023-10-24

## 2022-11-14 ENCOUNTER — Encounter: Payer: Self-pay | Admitting: Cardiology

## 2022-11-14 ENCOUNTER — Ambulatory Visit: Payer: Medicare Other | Attending: Cardiology | Admitting: Cardiology

## 2022-11-14 VITALS — BP 116/64 | HR 67 | Ht 74.0 in | Wt 225.2 lb

## 2022-11-14 DIAGNOSIS — I480 Paroxysmal atrial fibrillation: Secondary | ICD-10-CM | POA: Diagnosis not present

## 2022-11-14 DIAGNOSIS — I2510 Atherosclerotic heart disease of native coronary artery without angina pectoris: Secondary | ICD-10-CM

## 2022-11-14 DIAGNOSIS — D6869 Other thrombophilia: Secondary | ICD-10-CM | POA: Diagnosis not present

## 2022-11-14 DIAGNOSIS — G4733 Obstructive sleep apnea (adult) (pediatric): Secondary | ICD-10-CM

## 2022-11-14 NOTE — Progress Notes (Signed)
Cardiology Office Note:    Date:  11/14/2022   ID:  Jordan [name], DOB 1945/09/29, MRN [id number]  PCP:  Jordan Ip, DO   [facility] Providers Cardiologist:  Jordan Schultz, MD Electrophysiologist:  Jordan Jorja Loa, MD     Referring MD: Jordan Ip, DO    History of Present Illness:    Jordan [name] is a 77 y.o. male here for the follow-up of atrial fibrillation status post ablation with Dr. Elberta Fortis on 10/25/2021.   Used to coach and play tennis. Now plays pickle ball, not able to play yet with lumbar spine surgery. Gained 20 pounds. Just started elliptical.   Doing well.  Has had spinal stenosis procedure.  Denies any chest pain fevers chills nausea vomiting syncope bleeding. No CP.   Past Medical History:  Diagnosis Date   Anxiety    Mild   Atrial fibrillation (HCC)    Paroxysmal   BPH (benign prostatic hyperplasia)    CAD (coronary artery disease)    Stent LAD, 2002  /  nuclear 2009, no ischemia  /    Hospital October, 2011 nuclear, no ischemia, possible slight apical scar, ejection fraction 70%   Cancer (HCC)    prostate CA-non aggressive   Carotid bruit    Doppler November, 2011, normal   Chest pain 02/23/2013   Chronic anticoagulation    Low CHADS , score, but patient wants to be aggressive   Colon polyps    COVID-19    + long Covid problems   Drug intolerance    Mild beta blocker intolerance   Dyslipidemia    Ejection fraction    EF 65-70%, echo, 2007 /  EF 70%, nuclear, 2011   Hyperlipidemia    IBS (irritable bowel syndrome)    Incomplete RBBB    Knee pain    left   Personal history of colonic adenomas 11/29/2006   Qualifier: Diagnosis of  By: Genelle Gather CMA, [place]     Sleep apnea    Syncope    November, 2010    Past Surgical History:  Procedure Laterality Date   ATRIAL FIBRILLATION ABLATION N/A 10/25/2021   Procedure: ATRIAL FIBRILLATION ABLATION;  Surgeon: Regan Lemming, MD;  Location: [facility];   Service: Cardiovascular;  Laterality: N/A;   BACK SURGERY  05/1990   [id number]   CARDIAC CATHETERIZATION     COLONOSCOPY  multiple   LUMBAR LAMINECTOMY/ DECOMPRESSION WITH MET-RX Left 09/13/2022   Procedure: Minimally Invasive  Laminectomy, Medical Facetectomy ,Microdiscectomy Left Lumbar Four-Five;  Surgeon: Bedelia Person, MD;  Location: [facility] OR;  Service: Neurosurgery;  Laterality: Left;  3C   PROSTATE SURGERY     UMBILICAL HERNIA REPAIR  07/15/2021   [facility]   XI ROBOTIC ASSISTED SIMPLE PROSTATECTOMY  2022    Current Medications: Current Meds  Medication Sig   apixaban (ELIQUIS) 5 MG TABS tablet TAKE  (1)  TABLET TWICE A DAY.   atorvastatin (LIPITOR) 80 MG tablet TAKE ONE TABLET ONCE DAILY AT 6PM   Calcium Carb-Cholecalciferol (CALCIUM 600+D3 PO) Take 1 capsule by mouth daily.   carboxymethylcellulose (REFRESH PLUS) 0.5 % SOLN Place 1 drop into both eyes 3 (three) times daily as needed (dry eyes).   Cholecalciferol (VITAMIN D3) 1000 UNITS CAPS Take 1,000 Units by mouth daily.   diltiazem (CARDIZEM CD) 180 MG 24 hr capsule Take 1 capsule (180 mg total) by mouth daily.   diltiazem (CARDIZEM) 60 MG tablet Take 1 tablet (60 mg total)  by mouth as needed.   fluticasone (FLONASE) 50 MCG/ACT nasal spray Place 2 sprays into both nostrils daily.   GLUCOSAMINE-CHONDROITIN PO Take 1,500 mg by mouth daily.   Multiple Vitamins-Minerals (MULTIVITAMIN WITH MINERALS) tablet Take 1 tablet by mouth daily.   nitroGLYCERIN (NITROSTAT) 0.4 MG SL tablet Place 1 tablet (0.4 mg total) under the tongue every 5 (five) minutes as needed for chest pain.   NON FORMULARY Pt uses a cpap nightly   Probiotic Product (ALIGN PO) Take 1 capsule by mouth daily.   triamcinolone cream (KENALOG) 0.1 % Apply 1 Application topically 2 (two) times daily.     Allergies:   Levofloxacin, Lopid [gemfibrozil], Sulfonamide derivatives, and Sulfa antibiotics   Social History   Socioeconomic History   Marital status: Married     Spouse name: [name]    Number of children: 3   Years of education: bachelors   Highest education level: Bachelor's degree (e.g., BA, AB, BS)  Occupational History   Occupation: retired    Comment: Advice worker Lab  Tobacco Use   Smoking status: Never   Smokeless tobacco: Never   Tobacco comments:    Never smoke 07/27/22  Vaping Use   Vaping Use: Never used  Substance and Sexual Activity   Alcohol use: No   Drug use: No   Sexual activity: Yes  Other Topics Concern   Not on file  Social History Narrative   Married to [name], he has children and grandchildren.   [place], raises chickens, retired Copywriter, advertising    Financially cares for his disabled nephew and recently started caring for his disabled sister who resides in a nursing facility in [place]   Social Determinants of Health   Financial Resource Strain: Low Risk  (10/17/2022)   Overall Financial Resource Strain (CARDIA)    Difficulty of Paying Living Expenses: Not hard at all  Food Insecurity: No Food Insecurity (10/17/2022)   Hunger Vital Sign    Worried About Running Out of Food in the Last Year: Never true    Ran Out of Food in the Last Year: Never true  Transportation Needs: No Transportation Needs (10/17/2022)   PRAPARE - Administrator, Civil Service (Medical): No    Lack of Transportation (Non-Medical): No  Physical Activity: Sufficiently Active (10/17/2022)   Exercise Vital Sign    Days of Exercise per Week: 6 days    Minutes of Exercise per Session: 40 min  Stress: No Stress Concern Present (10/17/2022)   [employer] of Occupational Health - Occupational Stress Questionnaire    Feeling of Stress : Not at all  Social Connections: Socially Integrated (10/17/2022)   Social Connection and Isolation Panel [NHANES]    Frequency of Communication with Friends and Family: More than three times a week    Frequency of Social Gatherings with Friends and Family: Twice a week     Attends Religious Services: More than 4 times per year    Active Member of [employer] or Organizations: Yes    Attends Engineer, structural: More than 4 times per year    Marital Status: Married     Family History: The patient's family history includes COPD in his sister; Cancer in his daughter and sister; Dementia in his sister; Diabetes in his mother; Heart attack in his brother and mother; Heart disease in his brother; Macular degeneration in his sister; Obesity in his sister; Paranoid behavior in his sister; Stroke in his maternal grandfather and  paternal grandfather. There is no history of Colon cancer.  ROS:   Please see the history of present illness.     All other systems reviewed and are negative.  EKGs/Labs/Other Studies Reviewed:    The following studies were reviewed today: Cardiac Studies & Procedures     STRESS TESTS  EXERCISE TOLERANCE TEST (ETT) 10/05/2015  Narrative  There was no ST segment deviation noted during stress.  ETT with good exercise tolerance (9:00); no chest pain; normal BP response; no ST changes; negative adequate ETT.   ECHOCARDIOGRAM  ECHOCARDIOGRAM COMPLETE 09/12/2021  Narrative ECHOCARDIOGRAM REPORT    Patient Name:   TEDMAN LILLY Date of Exam: 09/12/2021 Medical Rec #:  [id number]       Height:       74.0 in Accession #:    [id number]      Weight:       212.2 lb Date of Birth:  07/09/1945       BSA:          2.229 m Patient Age:    75 years        BP:           102/72 mmHg Patient Gender: M               HR:           51 bpm. Exam Location:  [address]  Procedure: 3D Echo, 2D Echo, Cardiac Doppler and Color Doppler  Indications:    Atrial Fibrillation I48.91  History:        Patient has prior history of Echocardiogram examinations, most recent 02/24/2013. CAD; Risk Factors:Dyslipidemia.  Sonographer:    Thurman Coyer RDCS Referring Phys: [id number] Jordan MARTIN CAMNITZ  IMPRESSIONS   1. Left ventricular ejection  fraction, by estimation, is 55 to 60%. Left ventricular ejection fraction by 3D volume is 57 %. The left ventricle has normal function. The left ventricle has no regional wall motion abnormalities. Left ventricular diastolic parameters were normal. 2. Right ventricular systolic function is normal. The right ventricular size is mildly enlarged. Tricuspid regurgitation signal is inadequate for assessing PA pressure. 3. The mitral valve is normal in structure. Trivial mitral valve regurgitation. No evidence of mitral stenosis. 4. The aortic valve is normal in structure. Aortic valve regurgitation is not visualized. No aortic stenosis is present. 5. The inferior vena cava is dilated in size with >50% respiratory variability, suggesting right atrial pressure of 8 mmHg.  FINDINGS Left Ventricle: Left ventricular ejection fraction, by estimation, is 55 to 60%. Left ventricular ejection fraction by 3D volume is 57 %. The left ventricle has normal function. The left ventricle has no regional wall motion abnormalities. The left ventricular internal cavity size was normal in size. There is no left ventricular hypertrophy. Left ventricular diastolic parameters were normal. Normal left ventricular filling pressure.  Right Ventricle: The right ventricular size is mildly enlarged. No increase in right ventricular wall thickness. Right ventricular systolic function is normal. Tricuspid regurgitation signal is inadequate for assessing PA pressure.  Left Atrium: Left atrial size was normal in size.  Right Atrium: Right atrial size was normal in size.  Pericardium: There is no evidence of pericardial effusion.  Mitral Valve: The mitral valve is normal in structure. Trivial mitral valve regurgitation. No evidence of mitral valve stenosis.  Tricuspid Valve: The tricuspid valve is normal in structure. Tricuspid valve regurgitation is trivial. No evidence of tricuspid stenosis.  Aortic Valve: The aortic valve is  normal in structure.  Aortic valve regurgitation is not visualized. No aortic stenosis is present.  Pulmonic Valve: The pulmonic valve was normal in structure. Pulmonic valve regurgitation is not visualized. No evidence of pulmonic stenosis.  Aorta: The aortic root is normal in size and structure.  Venous: The inferior vena cava is dilated in size with greater than 50% respiratory variability, suggesting right atrial pressure of 8 mmHg.  IAS/Shunts: No atrial level shunt detected by color flow Doppler.   LEFT VENTRICLE PLAX 2D LVIDd:         4.60 cm         Diastology LVIDs:         2.40 cm         LV e' medial:    9.25 cm/s LV PW:         0.90 cm         LV E/e' medial:  9.0 LV IVS:        1.00 cm         LV e' lateral:   11.00 cm/s LVOT diam:     2.20 cm         LV E/e' lateral: 7.6 LV SV:         74 LV SV Index:   33 LVOT Area:     3.80 cm        3D Volume EF LV 3D EF:    Left ventricul ar ejection fraction by 3D volume is 57 %.  3D Volume EF: 3D EF:        57 % LV EDV:       144 ml LV ESV:       62 ml LV SV:        82 ml  RIGHT VENTRICLE RV Basal diam:  4.30 cm RV Mid diam:    3.70 cm RV S prime:     12.70 cm/s TAPSE (M-mode): 1.8 cm  LEFT ATRIUM             Index        RIGHT ATRIUM           Index LA diam:        3.50 cm 1.57 cm/m   RA Area:     20.50 cm LA Vol (A2C):   49.9 ml 22.39 ml/m  RA Volume:   66.80 ml  29.97 ml/m LA Vol (A4C):   42.2 ml 18.93 ml/m LA Biplane Vol: 46.0 ml 20.64 ml/m AORTIC VALVE LVOT Vmax:   97.30 cm/s LVOT Vmean:  58.600 cm/s LVOT VTI:    0.194 m  AORTA Ao Root diam: 3.60 cm  MITRAL VALVE MV Area (PHT): 4.68 cm    SHUNTS MV Decel Time: 162 msec    Systemic VTI:  0.19 m MV E velocity: 83.30 cm/s  Systemic Diam: 2.20 cm MV A velocity: 65.50 cm/s MV E/A ratio:  1.27  Armanda Magic MD Electronically signed by Armanda Magic MD Signature Date/Time: 09/12/2021/12:03:37 PM    Final              EKG: No new  Recent  Labs: 09/05/2022: Hemoglobin 14.8; Platelets 205 10/18/2022: ALT 25; BUN 22; Creatinine, Ser 0.97; Magnesium 2.0; Potassium 4.3; Sodium 140; TSH 2.160  Recent Lipid Panel    Component Value Date/Time   CHOL 123 10/18/2022 0937   CHOL 125 03/06/2013 0821   TRIG 86 10/18/2022 0937   TRIG 95 05/28/2017 1247   TRIG 73 03/06/2013 0821   HDL 34 (L) 10/18/2022  1610   HDL 35 (L) 05/28/2017 1247   HDL 40 03/06/2013 0821   CHOLHDL 3.6 10/18/2022 0937   CHOLHDL 2.8 02/24/2013 0900   VLDL 16 02/24/2013 0900   LDLCALC 72 10/18/2022 0937   LDLCALC 68 11/18/2013 0804   LDLCALC 70 03/06/2013 0821     Risk Assessment/Calculations:    CHA2DS2-VASc Score = 3   This indicates a 3.2% annual risk of stroke. The patient's score is based upon: CHF History: 0 HTN History: 0 Diabetes History: 0 Stroke History: 0 Vascular Disease History: 1 Age Score: 2 Gender Score: 0               Physical Exam:    VS:  BP 116/64   Pulse 67   Ht 6\' 2"  (1.88 m)   Wt 225 lb 3.2 oz (102.2 kg)   SpO2 98%   BMI 28.91 kg/m     Wt Readings from Last 3 Encounters:  11/14/22 225 lb 3.2 oz (102.2 kg)  10/18/22 222 lb (100.7 kg)  10/16/22 227 lb 11.2 oz (103.3 kg)     GEN:  Well nourished, well developed in no acute distress HEENT: Normal NECK: No JVD; No carotid bruits LYMPHATICS: No lymphadenopathy CARDIAC: RRR, no murmurs, rubs, gallops RESPIRATORY:  Clear to auscultation without rales, wheezing or rhonchi  ABDOMEN: Soft, non-tender, non-distended MUSCULOSKELETAL:  No edema; No deformity, wearing compression hose SKIN: Warm and dry NEUROLOGIC:  Alert and oriented x 3 PSYCHIATRIC:  Normal affect   ASSESSMENT:    1. Paroxysmal atrial fibrillation (HCC)   2. Hypercoagulable state due to paroxysmal atrial fibrillation (HCC)   3. Coronary artery disease involving native coronary artery of native heart without angina pectoris   4. OSA on CPAP    PLAN:    In order of problems listed  above:  Paroxysmal atrial fibrillation status post ablation May 2023 Dr. Elberta Fortis -Appearing in sinus rhythm.  Doing well.  Diltiazem 180 daily.  Has 60 as needed for breakthrough.  Chronic anticoagulation - Eliquis 5 mg twice a day, no bleeding. -CHA2DS2-VASc of 3. -He inquired about Watchman device.  He Jordan think about this further.  He does enjoy a road biking, pickleball and other activities that could involve higher risk for bleeding.  He Jordan message me if he is interested in consultation.  Coronary artery disease no anginal symptoms -LAD stent 2002.  No anginal symptoms.  On Eliquis, no aspirin.  Obstructive sleep apnea compliant with CPAP.  Hyperlipidemia - On atorvastatin 80 mg daily high intensity dose.  LDL goal less than 70.          Medication Adjustments/Labs and Tests Ordered: Current medicines are reviewed at length with the patient today.  Concerns regarding medicines are outlined above.  No orders of the defined types were placed in this encounter.  No orders of the defined types were placed in this encounter.   Patient Instructions  Medication Instructions:  The current medical regimen is effective;  continue present plan and medications.  *If you need a refill on your cardiac medications before your next appointment, please call your pharmacy*  Follow-Up: At [facility], you and your health needs are our priority.  As part of our continuing mission to provide you with exceptional heart care, we have created designated Provider Care Teams.  These Care Teams include your primary Cardiologist (physician) and Advanced Practice Providers (APPs -  Physician Assistants and Nurse Practitioners) who all work together to provide you with the care  you need, when you need it.  We recommend signing up for the patient portal called "MyChart".  Sign up information is provided on this After Visit Summary.  MyChart is used to connect with patients for Virtual  Visits (Telemedicine).  Patients are able to view lab/test results, encounter notes, upcoming appointments, etc.  Non-urgent messages can be sent to your provider as well.   To learn more about what you can do with MyChart, go to [email or web address].    Your next appointment:   6 month(s)  Provider:   You Jordan follow up in the Atrial Fibrillation Clinic located at [facility]. Your provider Jordan be: Clint R. Fenton, PA-C  And Dr Anne Fu in 1 year.       Signed, Jordan Schultz, MD  11/14/2022 9:29 AM    [facility]

## 2022-11-14 NOTE — Patient Instructions (Signed)
Medication Instructions:  The current medical regimen is effective;  continue present plan and medications.  *If you need a refill on your cardiac medications before your next appointment, please call your pharmacy*  Follow-Up: At [facility], you and your health needs are our priority.  As part of our continuing mission to provide you with exceptional heart care, we have created designated Provider Care Teams.  These Care Teams include your primary Cardiologist (physician) and Advanced Practice Providers (APPs -  Physician Assistants and Nurse Practitioners) who all work together to provide you with the care you need, when you need it.  We recommend signing up for the patient portal called "MyChart".  Sign up information is provided on this After Visit Summary.  MyChart is used to connect with patients for Virtual Visits (Telemedicine).  Patients are able to view lab/test results, encounter notes, upcoming appointments, etc.  Non-urgent messages can be sent to your provider as well.   To learn more about what you can do with MyChart, go to [email or web address].    Your next appointment:   6 month(s)  Provider:   You will follow up in the Atrial Fibrillation Clinic located at [facility]. Your provider will be: Clint R. Fenton, PA-C  And Dr Anne Fu in 1 year.

## 2022-11-23 ENCOUNTER — Ambulatory Visit (INDEPENDENT_AMBULATORY_CARE_PROVIDER_SITE_OTHER): Payer: Medicare Other

## 2022-11-23 ENCOUNTER — Encounter (INDEPENDENT_AMBULATORY_CARE_PROVIDER_SITE_OTHER): Payer: Medicare Other | Admitting: Family Medicine

## 2022-11-23 VITALS — Ht 74.0 in | Wt 220.0 lb

## 2022-11-23 DIAGNOSIS — I83893 Varicose veins of bilateral lower extremities with other complications: Secondary | ICD-10-CM

## 2022-11-23 DIAGNOSIS — Z0000 Encounter for general adult medical examination without abnormal findings: Secondary | ICD-10-CM

## 2022-11-23 DIAGNOSIS — C61 Malignant neoplasm of prostate: Secondary | ICD-10-CM

## 2022-11-23 NOTE — Patient Instructions (Signed)
Mr. Jordan [name] , Thank you for taking time to come for your Medicare Wellness Visit. I appreciate your ongoing commitment to your health goals. Please review the following plan we discussed and let me know if I can assist you in the future.   These are the goals we discussed:  Goals      Prevent falls     Stay active  Retirement          This is a list of the screening recommended for you and due dates:  Health Maintenance  Topic Date Due   COVID-19 Vaccine (6 - 2023-24 season) 06/06/2022   Flu Shot  01/11/2023   Medicare Annual Wellness Visit  11/23/2023   Colon Cancer Screening  03/28/2025   DTaP/Tdap/Td vaccine (2 - Td or Tdap) 11/29/2026   Pneumonia Vaccine  Completed   Hepatitis C Screening  Completed   Zoster (Shingles) Vaccine  Completed   HPV Vaccine  Aged Out    Advanced directives: Please bring a copy of your health care power of attorney and living will to the office to be added to your chart at your convenience.   Conditions/risks identified: Aim for 30 minutes of exercise or brisk walking, 6-8 glasses of water, and 5 servings of fruits and vegetables each day.   Next appointment: Follow up in one year for your annual wellness visit.   Preventive Care 17 Years and Older, Male  Preventive care refers to lifestyle choices and visits with your health care provider that can promote health and wellness. What does preventive care include? A yearly physical exam. This is also called an annual well check. Dental exams once or twice a year. Routine eye exams. Ask your health care provider how often you should have your eyes checked. Personal lifestyle choices, including: Daily care of your teeth and gums. Regular physical activity. Eating a healthy diet. Avoiding tobacco and drug use. Limiting alcohol use. Practicing safe sex. Taking low doses of aspirin every day. Taking vitamin and mineral supplements as recommended by your health care provider. What happens during  an annual well check? The services and screenings done by your health care provider during your annual well check will depend on your age, overall health, lifestyle risk factors, and family history of disease. Counseling  Your health care provider may ask you questions about your: Alcohol use. Tobacco use. Drug use. Emotional well-being. Home and relationship well-being. Sexual activity. Eating habits. History of falls. Memory and ability to understand (cognition). Work and work Astronomer. Screening  You may have the following tests or measurements: Height, weight, and BMI. Blood pressure. Lipid and cholesterol levels. These may be checked every 5 years, or more frequently if you are over 67 years old. Skin check. Lung cancer screening. You may have this screening every year starting at age 64 if you have a 30-pack-year history of smoking and currently smoke or have quit within the past 15 years. Fecal occult blood test (FOBT) of the stool. You may have this test every year starting at age 23. Flexible sigmoidoscopy or colonoscopy. You may have a sigmoidoscopy every 5 years or a colonoscopy every 10 years starting at age 79. Prostate cancer screening. Recommendations will vary depending on your family history and other risks. Hepatitis C blood test. Hepatitis B blood test. Sexually transmitted disease (STD) testing. Diabetes screening. This is done by checking your blood sugar (glucose) after you have not eaten for a while (fasting). You may have this done every 1-3 years. Abdominal  aortic aneurysm (AAA) screening. You may need this if you are a current or former smoker. Osteoporosis. You may be screened starting at age 62 if you are at high risk. Talk with your health care provider about your test results, treatment options, and if necessary, the need for more tests. Vaccines  Your health care provider may recommend certain vaccines, such as: Influenza vaccine. This is recommended  every year. Tetanus, diphtheria, and acellular pertussis (Tdap, Td) vaccine. You may need a Td booster every 10 years. Zoster vaccine. You may need this after age 12. Pneumococcal 13-valent conjugate (PCV13) vaccine. One dose is recommended after age 64. Pneumococcal polysaccharide (PPSV23) vaccine. One dose is recommended after age 70. Talk to your health care provider about which screenings and vaccines you need and how often you need them. This information is not intended to replace advice given to you by your health care provider. Make sure you discuss any questions you have with your health care provider. Document Released: 06/25/2015 Document Revised: 02/16/2016 Document Reviewed: 03/30/2015 Elsevier Interactive Patient Education  2017 [place] Prevention in the Home Falls can cause injuries. They can happen to people of all ages. There are many things you can do to make your home safe and to help prevent falls. What can I do on the outside of my home? Regularly fix the edges of walkways and driveways and fix any cracks. Remove anything that might make you trip as you walk through a door, such as a raised step or threshold. Trim any bushes or trees on the path to your home. Use bright outdoor lighting. Clear any walking paths of anything that might make someone trip, such as rocks or tools. Regularly check to see if handrails are loose or broken. Make sure that both sides of any steps have handrails. Any raised decks and porches should have guardrails on the edges. Have any leaves, snow, or ice cleared regularly. Use sand or salt on walking paths during winter. Clean up any spills in your garage right away. This includes oil or grease spills. What can I do in the bathroom? Use night lights. Install grab bars by the toilet and in the tub and shower. Do not use towel bars as grab bars. Use non-skid mats or decals in the tub or shower. If you need to sit down in the shower,  use a plastic, non-slip stool. Keep the floor dry. Clean up any water that spills on the floor as soon as it happens. Remove soap buildup in the tub or shower regularly. Attach bath mats securely with double-sided non-slip rug tape. Do not have throw rugs and other things on the floor that can make you trip. What can I do in the bedroom? Use night lights. Make sure that you have a light by your bed that is easy to reach. Do not use any sheets or blankets that are too big for your bed. They should not hang down onto the floor. Have a firm chair that has side arms. You can use this for support while you get dressed. Do not have throw rugs and other things on the floor that can make you trip. What can I do in the kitchen? Clean up any spills right away. Avoid walking on wet floors. Keep items that you use a lot in easy-to-reach places. If you need to reach something above you, use a strong step stool that has a grab bar. Keep electrical cords out of the way. Do not use  floor polish or wax that makes floors slippery. If you must use wax, use non-skid floor wax. Do not have throw rugs and other things on the floor that can make you trip. What can I do with my stairs? Do not leave any items on the stairs. Make sure that there are handrails on both sides of the stairs and use them. Fix handrails that are broken or loose. Make sure that handrails are as long as the stairways. Check any carpeting to make sure that it is firmly attached to the stairs. Fix any carpet that is loose or worn. Avoid having throw rugs at the top or bottom of the stairs. If you do have throw rugs, attach them to the floor with carpet tape. Make sure that you have a light switch at the top of the stairs and the bottom of the stairs. If you do not have them, ask someone to add them for you. What else can I do to help prevent falls? Wear shoes that: Do not have high heels. Have rubber bottoms. Are comfortable and fit you  well. Are closed at the toe. Do not wear sandals. If you use a stepladder: Make sure that it is fully opened. Do not climb a closed stepladder. Make sure that both sides of the stepladder are locked into place. Ask someone to hold it for you, if possible. Clearly mark and make sure that you can see: Any grab bars or handrails. First and last steps. Where the edge of each step is. Use tools that help you move around (mobility aids) if they are needed. These include: Canes. Walkers. Scooters. Crutches. Turn on the lights when you go into a dark area. Replace any light bulbs as soon as they burn out. Set up your furniture so you have a clear path. Avoid moving your furniture around. If any of your floors are uneven, fix them. If there are any pets around you, be aware of where they are. Review your medicines with your doctor. Some medicines can make you feel dizzy. This can increase your chance of falling. Ask your doctor what other things that you can do to help prevent falls. This information is not intended to replace advice given to you by your health care provider. Make sure you discuss any questions you have with your health care provider. Document Released: 03/25/2009 Document Revised: 11/04/2015 Document Reviewed: 07/03/2014 Elsevier Interactive Patient Education  2017 [employer].

## 2022-11-23 NOTE — Progress Notes (Signed)
Subjective:   Jordan [name] is a 77 y.o. male who presents for Medicare Annual/Subsequent preventive examination. I connected with  Jordan [name] on 11/23/22 by a audio enabled telemedicine application and verified that I am speaking with the correct person using two identifiers.  Patient Location: Home  Provider Location: Home Office  I discussed the limitations of evaluation and management by telemedicine. The patient expressed understanding and agreed to proceed.  Review of Systems     Cardiac Risk Factors include: advanced age (>54men, >46 women);male gender;dyslipidemia;hypertension     Objective:    Today's Vitals   11/23/22 1445  Weight: 220 lb (99.8 kg)  Height: 6\' 2"  (1.88 m)   Body mass index is 28.25 kg/m.     11/23/2022    2:47 PM 09/05/2022   10:30 AM 11/21/2021    2:50 PM 10/25/2021    8:54 AM 11/18/2020    2:59 PM 11/18/2019    2:47 PM 10/24/2018    9:44 AM  Advanced Directives  Does Patient Have a Medical Advance Directive? Yes Yes Yes Yes Yes Yes Yes  Type of Estate agent of [place];Living will Healthcare Power of [place];Living will Healthcare Power of [place];Living will Healthcare Power of [employer] of [place];Living will Healthcare Power of [place];Living will Healthcare Power of [place];Living will  Does patient want to make changes to medical advance directive?  No - Patient declined  No - Patient declined  No - Patient declined No - Patient declined  Copy of Healthcare Power of Attorney in Chart? No - copy requested No - copy requested No - copy requested  No - copy requested No - copy requested No - copy requested    Current Medications (verified) Outpatient Encounter Medications as of 11/23/2022  Medication Sig   apixaban (ELIQUIS) 5 MG TABS tablet TAKE  (1)  TABLET TWICE A DAY.   atorvastatin (LIPITOR) 80 MG tablet TAKE ONE TABLET ONCE DAILY AT 6PM   Calcium Carb-Cholecalciferol (CALCIUM 600+D3 PO) Take  1 capsule by mouth daily.   carboxymethylcellulose (REFRESH PLUS) 0.5 % SOLN Place 1 drop into both eyes 3 (three) times daily as needed (dry eyes).   Cholecalciferol (VITAMIN D3) 1000 UNITS CAPS Take 1,000 Units by mouth daily.   diltiazem (CARDIZEM CD) 180 MG 24 hr capsule Take 1 capsule (180 mg total) by mouth daily.   diltiazem (CARDIZEM) 60 MG tablet Take 1 tablet (60 mg total) by mouth as needed.   fluticasone (FLONASE) 50 MCG/ACT nasal spray Place 2 sprays into both nostrils daily.   GLUCOSAMINE-CHONDROITIN PO Take 1,500 mg by mouth daily.   Multiple Vitamins-Minerals (MULTIVITAMIN WITH MINERALS) tablet Take 1 tablet by mouth daily.   nitroGLYCERIN (NITROSTAT) 0.4 MG SL tablet Place 1 tablet (0.4 mg total) under the tongue every 5 (five) minutes as needed for chest pain.   NON FORMULARY Pt uses a cpap nightly   Probiotic Product (ALIGN PO) Take 1 capsule by mouth daily.   triamcinolone cream (KENALOG) 0.1 % Apply 1 Application topically 2 (two) times daily.   No facility-administered encounter medications on file as of 11/23/2022.    Allergies (verified) Levofloxacin, Lopid [gemfibrozil], Sulfonamide derivatives, and Sulfa antibiotics   History: Past Medical History:  Diagnosis Date   Anxiety    Mild   Atrial fibrillation (HCC)    Paroxysmal   BPH (benign prostatic hyperplasia)    CAD (coronary artery disease)    Stent LAD, 2002  /  nuclear 2009, no ischemia  /  Hospital October, 2011 nuclear, no ischemia, possible slight apical scar, ejection fraction 70%   Cancer (HCC)    prostate CA-non aggressive   Carotid bruit    Doppler November, 2011, normal   Chest pain 02/23/2013   Chronic anticoagulation    Low CHADS , score, but patient wants to be aggressive   Colon polyps    COVID-19    + long Covid problems   Drug intolerance    Mild beta blocker intolerance   Dyslipidemia    Ejection fraction    EF 65-70%, echo, 2007 /  EF 70%, nuclear, 2011   Hyperlipidemia    IBS  (irritable bowel syndrome)    Incomplete RBBB    Knee pain    left   Personal history of colonic adenomas 11/29/2006   Qualifier: Diagnosis of  By: Genelle Gather CMA, [place]     Sleep apnea    Syncope    November, 2010   Past Surgical History:  Procedure Laterality Date   ATRIAL FIBRILLATION ABLATION N/A 10/25/2021   Procedure: ATRIAL FIBRILLATION ABLATION;  Surgeon: Regan Lemming, MD;  Location: [facility];  Service: Cardiovascular;  Laterality: N/A;   BACK SURGERY  05/1990   [id number]   CARDIAC CATHETERIZATION     COLONOSCOPY  multiple   LUMBAR LAMINECTOMY/ DECOMPRESSION WITH MET-RX Left 09/13/2022   Procedure: Minimally Invasive  Laminectomy, Medical Facetectomy ,Microdiscectomy Left Lumbar Four-Five;  Surgeon: Bedelia Person, MD;  [facility];  Service: Neurosurgery;  Laterality: Left;  3C   PROSTATE SURGERY     UMBILICAL HERNIA REPAIR  07/15/2021   [facility]   XI ROBOTIC ASSISTED SIMPLE PROSTATECTOMY  2022   Family History  Problem Relation Age of Onset   Heart attack Mother    Diabetes Mother    Macular degeneration Sister    Obesity Sister    Paranoid behavior Sister    Dementia Sister    Cancer Sister        lung - uterus    COPD Sister    Heart attack Brother    Heart disease Brother        cause of death heart attack   Cancer Daughter        colon    Stroke Maternal Grandfather    Stroke Paternal Grandfather    Colon cancer Neg Hx    Social History   Socioeconomic History   Marital status: Married    Spouse name: [name]    Number of children: 3   Years of education: bachelors   Highest education level: Bachelor's degree (e.g., BA, AB, BS)  Occupational History   Occupation: retired    Comment: Advice worker Lab  Tobacco Use   Smoking status: Never   Smokeless tobacco: Never   Tobacco comments:    Never smoke 07/27/22  Vaping Use   Vaping Use: Never used  Substance and Sexual Activity   Alcohol use: No   Drug use: No    Sexual activity: Yes  Other Topics Concern   Not on file  Social History Narrative   Married to [name], he has children and grandchildren.   [place], raises chickens, retired Copywriter, advertising    Financially cares for his disabled nephew and recently started caring for his disabled sister who resides in a nursing facility in [place]   Social Determinants of Health   Financial Resource Strain: Low Risk  (11/23/2022)   Overall Financial Resource Strain (CARDIA)  Difficulty of Paying Living Expenses: Not hard at all  Food Insecurity: No Food Insecurity (11/23/2022)   Hunger Vital Sign    Worried About Running Out of Food in the Last Year: Never true    Ran Out of Food in the Last Year: Never true  Transportation Needs: No Transportation Needs (11/23/2022)   PRAPARE - Administrator, Civil Service (Medical): No    Lack of Transportation (Non-Medical): No  Physical Activity: Sufficiently Active (11/23/2022)   Exercise Vital Sign    Days of Exercise per Week: 5 days    Minutes of Exercise per Session: 30 min  Stress: No Stress Concern Present (11/23/2022)   [employer] of Occupational Health - Occupational Stress Questionnaire    Feeling of Stress : Not at all  Social Connections: Socially Integrated (11/23/2022)   Social Connection and Isolation Panel [NHANES]    Frequency of Communication with Friends and Family: More than three times a week    Frequency of Social Gatherings with Friends and Family: More than three times a week    Attends Religious Services: More than 4 times per year    Active Member of [employer] or Organizations: Yes    Attends Engineer, structural: More than 4 times per year    Marital Status: Married    Tobacco Counseling Counseling given: Not Answered Tobacco comments: Never smoke 07/27/22   Clinical Intake:  Pre-visit preparation completed: Yes  Pain : No/denies pain     Nutritional Risks: None Diabetes: No  How  often do you need to have someone help you when you read instructions, pamphlets, or other written materials from your doctor or pharmacy?: 1 - Never  Diabetic?no   Interpreter Needed?: No  Information entered by :: Renie Ora, LPN   Activities of Daily Living    11/23/2022    2:48 PM 09/05/2022   10:35 AM  In your present state of health, do you have any difficulty performing the following activities:  Hearing? 0   Vision? 0   Difficulty concentrating or making decisions? 0   Walking or climbing stairs? 0   Dressing or bathing? 0   Doing errands, shopping? 0 0  Preparing Food and eating ? N   Using the Toilet? N   In the past six months, have you accidently leaked urine? N   Do you have problems with loss of bowel control? N   Managing your Medications? N   Managing your Finances? N   Housekeeping or managing your Housekeeping? N     Patient Care Team: Raliegh Ip, DO as PCP - General (Family Medicine) Jake Bathe, MD as PCP - Cardiology (Cardiology) Regan Lemming, MD as PCP - Electrophysiology (Cardiology) Iva Boop, MD (Gastroenterology) Donzetta Starch, MD as Consulting Physician (Dermatology) Corey Skains (Urology) Dohmeier, Porfirio Mylar, MD as Consulting Physician (Neurology) Michaelle Copas, MD as Referring Physician (Optometry) Linna Caprice, Arlys John, MD as Consulting Physician (Orthopedic Surgery)  Indicate any recent Medical Services you may have received from other than [facility] providers in the past year (date may be approximate).     Assessment:   This is a routine wellness examination for [place].  Hearing/Vision screen Vision Screening - Comments:: Wears rx glasses - up to date with routine eye exams with  Dr.Lee   Dietary issues and exercise activities discussed: Current Exercise Habits: Home exercise routine, Type of exercise: walking, Time (Minutes): 30, Frequency (Times/Week): 5, Weekly Exercise (Minutes/Week): 150,  Intensity:  Mild, Exercise limited by: None identified   Goals Addressed             This Visit's Progress    Prevent falls   On track    Stay active  Retirement         Depression Screen    11/23/2022    2:47 PM 10/18/2022    9:03 AM 04/18/2022    9:17 AM 01/17/2022    2:27 PM 11/21/2021    2:50 PM 10/18/2021    8:03 AM 04/20/2021    8:25 AM  PHQ 2/9 Scores  PHQ - 2 Score 0 0 0 0 0 0 0  PHQ- 9 Score 0 0         Fall Risk    11/23/2022    2:46 PM 10/18/2022    9:03 AM 04/18/2022    9:17 AM 01/17/2022    2:27 PM 11/21/2021    2:47 PM  Fall Risk   Falls in the past year? 0 0 0 1 1  Number falls in past yr: 0 0  0 0  Injury with Fall? 0 0  1 1  Risk for fall due to : No Fall Risks No Fall Risks  History of fall(s) History of fall(s);Orthopedic patient  Follow up Falls prevention discussed Education provided  Falls evaluation completed Education provided;Falls prevention discussed    FALL RISK PREVENTION PERTAINING TO THE HOME:  Any stairs in or around the home? No  If so, are there any without handrails? No  Home free of loose throw rugs in walkways, pet beds, electrical cords, etc? Yes  Adequate lighting in your home to reduce risk of falls? Yes   ASSISTIVE DEVICES UTILIZED TO PREVENT FALLS:  Life alert? No  Use of a cane, walker or w/c? No  Grab bars in the bathroom? Yes  Shower chair or bench in shower? Yes  Elevated toilet seat or a handicapped toilet? No          11/23/2022    2:48 PM 11/21/2021    2:52 PM 11/18/2019    2:51 PM 11/18/2019    2:39 PM 10/24/2018    9:46 AM  6CIT Screen  What Year? 0 points 0 points 0 points 0 points 0 points  What month? 0 points 0 points 0 points 0 points 0 points  What time? 0 points 0 points 0 points  0 points  Count back from 20 0 points 0 points 0 points  0 points  Months in reverse 0 points 0 points 0 points  0 points  Repeat phrase 0 points 0 points 0 points  0 points  Total Score 0 points 0 points 0 points  0 points     Immunizations Immunization History  Administered Date(s) Administered   COVID-19, mRNA, vaccine(Comirnaty)12 years and older 04/11/2022   Influenza,inj,Quad PF,6+ Mos 02/24/2013, 03/28/2014, 03/26/2015, 03/10/2017, 03/26/2018   Influenza-Unspecified 03/25/2016, 03/26/2018, 03/13/2019   PFIZER(Purple Top)SARS-COV-2 Vaccination 07/08/2019, 07/29/2019, 03/11/2020, 09/17/2020   Pneumococcal Conjugate-13 11/07/2013   Pneumococcal Polysaccharide-23 10/16/2012   Tdap 11/28/2016   Zoster Recombinat (Shingrix) 10/11/2017, 12/10/2017    TDAP status: Up to date  Flu Vaccine status: Up to date  Pneumococcal vaccine status: Up to date  Covid-19 vaccine status: Completed vaccines  Qualifies for Shingles Vaccine? Yes   Zostavax completed Yes   Shingrix Completed?: Yes  Screening Tests Health Maintenance  Topic Date Due   COVID-19 Vaccine (6 - 2023-24 season) 06/06/2022   INFLUENZA VACCINE  01/11/2023  Medicare Annual Wellness (AWV)  11/23/2023   Colonoscopy  03/28/2025   DTaP/Tdap/Td (2 - Td or Tdap) 11/29/2026   Pneumonia Vaccine 49+ Years old  Completed   Hepatitis C Screening  Completed   Zoster Vaccines- Shingrix  Completed   HPV VACCINES  Aged Out    Health Maintenance  Health Maintenance Due  Topic Date Due   COVID-19 Vaccine (6 - 2023-24 season) 06/06/2022    Colorectal cancer screening: No longer required.   Lung Cancer Screening: (Low Dose CT Chest recommended if Age 64-80 years, 30 pack-year currently smoking OR have quit w/in 15years.) does not qualify.   Lung Cancer Screening Referral: n/a  Additional Screening:  Hepatitis C Screening: does not qualify; Completed 11/28/2016  Vision Screening: Recommended annual ophthalmology exams for early detection of glaucoma and other disorders of the eye. Is the patient up to date with their annual eye exam?  Yes  Who is the provider or what is the name of the office in which the patient attends annual eye exams?  Dr.Lee  If pt is not established with a provider, would they like to be referred to a provider to establish care? No .   Dental Screening: Recommended annual dental exams for proper oral hygiene  Community Resource Referral / Chronic Care Management: CRR required this visit?  No   CCM required this visit?  No      Plan:     I have personally reviewed and noted the following in the patient's chart:   Medical and social history Use of alcohol, tobacco or illicit drugs  Current medications and supplements including opioid prescriptions. Patient is not currently taking opioid prescriptions. Functional ability and status Nutritional status Physical activity Advanced directives List of other physicians Hospitalizations, surgeries, and ER visits in previous 12 months Vitals Screenings to include cognitive, depression, and falls Referrals and appointments  In addition, I have reviewed and discussed with patient certain preventive protocols, quality metrics, and best practice recommendations. A written personalized care plan for preventive services as well as general preventive health recommendations were provided to patient.     Lorrene Reid, LPN   [id number]   Nurse Notes: none

## 2022-11-24 NOTE — Telephone Encounter (Signed)

## 2022-11-27 ENCOUNTER — Other Ambulatory Visit: Payer: Medicare Other

## 2022-11-27 DIAGNOSIS — C61 Malignant neoplasm of prostate: Secondary | ICD-10-CM

## 2022-11-28 LAB — PSA: Prostate Specific Ag, Serum: 1.3 ng/mL (ref 0.0–4.0)

## 2022-11-29 ENCOUNTER — Other Ambulatory Visit: Payer: Self-pay | Admitting: Family Medicine

## 2022-11-29 DIAGNOSIS — I83893 Varicose veins of bilateral lower extremities with other complications: Secondary | ICD-10-CM

## 2022-12-11 ENCOUNTER — Ambulatory Visit (INDEPENDENT_AMBULATORY_CARE_PROVIDER_SITE_OTHER): Payer: Medicare Other | Admitting: Family Medicine

## 2022-12-11 ENCOUNTER — Encounter: Payer: Self-pay | Admitting: Family Medicine

## 2022-12-11 VITALS — BP 114/70 | HR 73 | Ht 74.0 in | Wt 226.0 lb

## 2022-12-11 DIAGNOSIS — L03114 Cellulitis of left upper limb: Secondary | ICD-10-CM | POA: Diagnosis not present

## 2022-12-11 MED ORDER — DOXYCYCLINE HYCLATE 100 MG PO TABS
100.0000 mg | ORAL_TABLET | Freq: Two times a day (BID) | ORAL | 0 refills | Status: DC
Start: 2022-12-11 — End: 2022-12-27

## 2022-12-11 NOTE — Progress Notes (Signed)
BP 114/70   Pulse 73   Ht 6\' 2"  (1.88 m)   Wt 226 lb (102.5 kg)   SpO2 99%   BMI 29.02 kg/m    Subjective:   Patient ID: Jordan [name], male    DOB: [birth year], 77 y.o.   MRN: [id number]  HPI: Jordan [name] is a 77 y.o. male presenting on 12/11/2022 for Insect Bite (Left arm. Believes yellow jacket sting)   HPI Patient had a bite or sting, he believes from yellowjacket on the lateral aspect of his left arm near the elbow that each day it has swollen more and spread and became red and warm.  Initially the swelling spread to his forearm but now it is starting to spread to his upper arm as well.  It is slightly sore and feels warm and hot and feels like it spreading.  He has been taking Benadryl and Tylenol and he does not feel like they have helped at all.  He denies any fevers or chills or bodyaches or shortness of breath or wheezing or swelling in his throat.  Relevant past medical, surgical, family and social history reviewed and updated as indicated. Interim medical history since our last visit reviewed. Allergies and medications reviewed and updated.  Review of Systems  Constitutional:  Negative for chills and fever.  Respiratory:  Negative for shortness of breath and wheezing.   Cardiovascular:  Negative for chest pain and leg swelling.  Musculoskeletal:  Negative for back pain and gait problem.  Skin:  Positive for color change and rash.  All other systems reviewed and are negative.   Per HPI unless specifically indicated above   Allergies as of 12/11/2022       Reactions   Levofloxacin Other (See Comments)   Couldn't breathe, passed out   Lopid [gemfibrozil] Other (See Comments)   Loose bowels   Sulfonamide Derivatives Hives   Sulfa Antibiotics Rash        Medication List        Accurate as of December 11, 2022  8:59 AM. If you have any questions, ask your nurse or doctor.          ALIGN PO Take 1 capsule by mouth daily.   apixaban 5 MG Tabs  tablet Commonly known as: Eliquis TAKE  (1)  TABLET TWICE A DAY.   atorvastatin 80 MG tablet Commonly known as: LIPITOR TAKE ONE TABLET ONCE DAILY AT 6PM   CALCIUM 600+D3 PO Take 1 capsule by mouth daily.   carboxymethylcellulose 0.5 % Soln Commonly known as: REFRESH PLUS Place 1 drop into both eyes 3 (three) times daily as needed (dry eyes).   diltiazem 180 MG 24 hr capsule Commonly known as: CARDIZEM CD Take 1 capsule (180 mg total) by mouth daily.   diltiazem 60 MG tablet Commonly known as: CARDIZEM Take 1 tablet (60 mg total) by mouth as needed.   doxycycline 100 MG tablet Commonly known as: VIBRA-TABS Take 1 tablet (100 mg total) by mouth 2 (two) times daily. 1 po bid Started by: Elige Radon Zenaida Tesar, MD   fluticasone 50 MCG/ACT nasal spray Commonly known as: FLONASE Place 2 sprays into both nostrils daily.   GLUCOSAMINE-CHONDROITIN PO Take 1,500 mg by mouth daily.   multivitamin with minerals tablet Take 1 tablet by mouth daily.   nitroGLYCERIN 0.4 MG SL tablet Commonly known as: NITROSTAT Place 1 tablet (0.4 mg total) under the tongue every 5 (five) minutes as needed for chest pain.   NON  FORMULARY Pt uses a cpap nightly   triamcinolone cream 0.1 % Commonly known as: KENALOG Apply 1 Application topically 2 (two) times daily.   Vitamin D3 25 MCG (1000 UT) Caps Take 1,000 Units by mouth daily.         Objective:   BP 114/70   Pulse 73   Ht 6\' 2"  (1.88 m)   Wt 226 lb (102.5 kg)   SpO2 99%   BMI 29.02 kg/m   Wt Readings from Last 3 Encounters:  12/11/22 226 lb (102.5 kg)  11/23/22 220 lb (99.8 kg)  11/14/22 225 lb 3.2 oz (102.2 kg)    Physical Exam Vitals and nursing note reviewed.  Constitutional:      Appearance: Normal appearance.  Skin:    Findings: Erythema (Erythema and swelling extending from his wrist to midway up his upper arm.  No specific wound or area of induration.) present. No wound.  Neurological:     Mental Status: He is  alert.       Assessment & Plan:   Problem List Items Addressed This Visit   None Visit Diagnoses     Cellulitis of left upper extremity    -  Primary   Relevant Medications   doxycycline (VIBRA-TABS) 100 MG tablet       Will do doxycycline, instructed him that it should start to look better within the first 48 hours, if it continues to spread after that then he needs to go to be seen again. Follow up plan: Return if symptoms worsen or fail to improve.  Counseling provided for all of the vaccine components No orders of the defined types were placed in this encounter.   Arville Care, MD [facility] 12/11/2022, 8:59 AM

## 2022-12-19 DIAGNOSIS — G4733 Obstructive sleep apnea (adult) (pediatric): Secondary | ICD-10-CM | POA: Diagnosis not present

## 2022-12-27 ENCOUNTER — Ambulatory Visit (INDEPENDENT_AMBULATORY_CARE_PROVIDER_SITE_OTHER): Payer: Medicare Other | Admitting: Family Medicine

## 2022-12-27 ENCOUNTER — Other Ambulatory Visit: Payer: Self-pay

## 2022-12-27 ENCOUNTER — Ambulatory Visit: Payer: Medicare Other | Attending: Neurosurgery | Admitting: Physical Therapy

## 2022-12-27 ENCOUNTER — Encounter: Payer: Self-pay | Admitting: Family Medicine

## 2022-12-27 ENCOUNTER — Encounter: Payer: Self-pay | Admitting: Physical Therapy

## 2022-12-27 VITALS — BP 109/70 | HR 62 | Temp 98.3°F | Ht 74.0 in | Wt 228.6 lb

## 2022-12-27 DIAGNOSIS — M62830 Muscle spasm of back: Secondary | ICD-10-CM | POA: Insufficient documentation

## 2022-12-27 DIAGNOSIS — M25561 Pain in right knee: Secondary | ICD-10-CM | POA: Insufficient documentation

## 2022-12-27 DIAGNOSIS — T63301A Toxic effect of unspecified spider venom, accidental (unintentional), initial encounter: Secondary | ICD-10-CM | POA: Diagnosis not present

## 2022-12-27 DIAGNOSIS — L03113 Cellulitis of right upper limb: Secondary | ICD-10-CM

## 2022-12-27 DIAGNOSIS — M5459 Other low back pain: Secondary | ICD-10-CM | POA: Insufficient documentation

## 2022-12-27 MED ORDER — DAPSONE 100 MG PO TABS: 100.0000 mg | ORAL_TABLET | Freq: Every day | ORAL | 0 refills | Status: DC

## 2022-12-27 MED ORDER — AMOXICILLIN-POT CLAVULANATE 875-125 MG PO TABS: 1.0000 | ORAL_TABLET | Freq: Two times a day (BID) | ORAL | 0 refills | Status: DC

## 2022-12-27 NOTE — Progress Notes (Signed)
Chief Complaint  Patient presents with   Insect Bite    Right arm reaction     HPI  Patient presents today for 5 days ago working around barn, in his garden. Bit by something. Now has redness spreading from the site on the right upper arm. There is discomfort as well. No systemic sx such as fever.  PMH: Smoking status noted ROS: Per HPI  Objective: BP 109/70   Pulse 62   Temp 98.3 F (36.8 C)   Ht 6\' 2"  (1.88 m)   Wt 228 lb 9.6 oz (103.7 kg)   SpO2 96%   BMI 29.35 kg/m  Gen: NAD, alert, cooperative with exam HEENT: NCAT,  Resp: CTABL, no wheezes, non-labored Skin:erythem of mid right upper arn from an indurated area the size of a nickel extending to the elbow and circumferentially around the arm.  Ext: No edema, warm Neuro: Alert and oriented, No gross deficits  Assessment and plan:  1. Spider bite wound, accidental or unintentional, initial encounter   2. Cellulitis of right upper extremity     Meds ordered this encounter  Medications   amoxicillin-clavulanate (AUGMENTIN) 875-125 MG tablet    Sig: Take 1 tablet by mouth 2 (two) times daily. Take all of this medication    Dispense:  20 tablet    Refill:  0   dapsone 100 MG tablet    Sig: Take 1 tablet (100 mg total) by mouth daily.    Dispense:  10 tablet    Refill:  0    No orders of the defined types were placed in this encounter.   Follow up as needed.  Mechele Claude, MD

## 2022-12-27 NOTE — Therapy (Signed)
OUTPATIENT PHYSICAL THERAPY THORACOLUMBAR EVALUATION   Patient Name: Jordan [name] MRN: [id number] DOB:December 03, 1945, 77 y.o., male Today's Date: 12/27/2022  END OF SESSION:  PT End of Session - 12/27/22 1041     Visit Number 1    Number of Visits 8    Date for PT Re-Evaluation 01/24/23    Authorization Type FOTO.    PT Start Time 0850    PT Stop Time 0939    PT Time Calculation (min) 49 min    Activity Tolerance Patient tolerated treatment well    Behavior During Therapy [facility] for tasks assessed/performed             Past Medical History:  Diagnosis Date   Anxiety    Mild   Atrial fibrillation (HCC)    Paroxysmal   BPH (benign prostatic hyperplasia)    CAD (coronary artery disease)    Stent LAD, 2002  /  nuclear 2009, no ischemia  /    Hospital October, 2011 nuclear, no ischemia, possible slight apical scar, ejection fraction 70%   Cancer (HCC)    prostate CA-non aggressive   Carotid bruit    Doppler November, 2011, normal   Chest pain 02/23/2013   Chronic anticoagulation    Low CHADS , score, but patient wants to be aggressive   Colon polyps    COVID-19    + long Covid problems   Drug intolerance    Mild beta blocker intolerance   Dyslipidemia    Ejection fraction    EF 65-70%, echo, 2007 /  EF 70%, nuclear, 2011   Hyperlipidemia    IBS (irritable bowel syndrome)    Incomplete RBBB    Knee pain    left   Personal history of colonic adenomas 11/29/2006   Qualifier: Diagnosis of  By: Genelle Gather CMA, [place]     Sleep apnea    Syncope    November, 2010   Past Surgical History:  Procedure Laterality Date   ATRIAL FIBRILLATION ABLATION N/A 10/25/2021   Procedure: ATRIAL FIBRILLATION ABLATION;  Surgeon: Regan Lemming, MD;  Location: [facility];  Service: Cardiovascular;  Laterality: N/A;   BACK SURGERY  05/1990   [id number]   CARDIAC CATHETERIZATION     COLONOSCOPY  multiple   LUMBAR LAMINECTOMY/ DECOMPRESSION WITH MET-RX Left 09/13/2022   Procedure:  Minimally Invasive  Laminectomy, Medical Facetectomy ,Microdiscectomy Left Lumbar Four-Five;  Surgeon: Bedelia Person, MD;  Location: [facility] OR;  Service: Neurosurgery;  Laterality: Left;  3C   PROSTATE SURGERY     UMBILICAL HERNIA REPAIR  07/15/2021   [facility]   XI ROBOTIC ASSISTED SIMPLE PROSTATECTOMY  2022   Patient Active Problem List   Diagnosis Date Noted   Back pain of lumbosacral region with sciatica 10/16/2022   Other spondylosis with radiculopathy, lumbar region 09/13/2022   History of meniscal tear 10/18/2021   Hypercoagulable state due to paroxysmal atrial fibrillation (HCC) 07/12/2021   OSA on CPAP 04/23/2020   Abnormal dreams 01/21/2020   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HCC) 12/22/2019   Long term (current) use of anticoagulants 06/24/2015   Coronary artery disease involving native coronary artery of native heart without angina pectoris 06/24/2015   Facet degeneration of lumbar region 08/26/2012   Lumbar radiculopathy 08/26/2012   Osteoarthritis of right knee 03/26/2011   Traumatic tear of lateral meniscus of right knee 03/26/2011   Ejection fraction    Atrial fibrillation (HCC)    Drug intolerance    Incomplete RBBB    Carotid  bruit    Knee pain    Anxiety    hyperlipidemia    Palpitations    Benign prostatic hyperplasia 02/06/2009   Personal history of colonic adenomas 11/29/2006    REFERRING PROVIDER: Hoyt Koch MD  REFERRING DIAG: Lumbar back pain.  S/p left lumbar microdiskectomy L4-5.  Rationale for Evaluation and Treatment: Rehabilitation  THERAPY DIAG:  Other low back pain - Plan: PT plan of care cert/re-cert  Muscle spasm of back - Plan: PT plan of care cert/re-cert  ONSET DATE: Ongoing, surgery (09/13/2022).  SUBJECTIVE:                                                                                                                                                                                           SUBJECTIVE STATEMENT: The patient presents  to the clinic s/p left lumbar microdiskectomy L4-5. He is very pleased with his surgical outcome.  He reports a pain range of 1-4/10.  Lifting and bending tend to increase his pain.  Ice helps decrease his pain.  The patient is looking forward to resuming an active lifestyle and hopes to play Pickleball again.   PERTINENT HISTORY:  Prior lumbar surgery (1991), h/o bilateral knee pain.  PAIN:  Are you having pain? Yes: NPRS scale: 1-4/10 Pain location: Left low back. Pain description: Sore Aggravating factors: As above. Relieving factors: As above.  PRECAUTIONS: Other: Lumbar diskectomy protocol.   WEIGHT BEARING RESTRICTIONS: No  FALLS:  Has patient fallen in last 6 months? No  LIVING ENVIRONMENT: Lives with: lives with their spouse Lives in: House/apartment Has following equipment at home: None  PLOF: Independent with basic ADLs  PATIENT GOALS: Play Pickleball.   OBJECTIVE:   POSTURE: rounded shoulders, forward head, and decreased lumbar lordosis  PALPATION: Tender to palpation over left lower lumbar musculature.  Incision appears to be healing well.    LOWER EXTREMITY ROM:     In supine:  Left hip flexion is 100 degrees and right is 105 degrees.  LOWER EXTREMITY MMT:    Normal bilateral knee and ankle strength.   GAIT: The patient walks in some trunk flexion.  TODAY'S TREATMENT:  DATE: Patient in right SDLY position with folded pillow between knees for comfort:  Performed STW/M x 9 minutes to patient's left lower lumbar musculature.   PATIENT EDUCATION:   HOME EXERCISE PROGRAM:   ASSESSMENT:  CLINICAL IMPRESSION: The patient presents to [facility]PPT s/p left lumbar diskectomy performed at L4-5 on 09/13/22.  He is very pleased with his surgical outcome thus far.  He has some tenderness in his left lower lumbar musculature region.  Bilateral  knee and ankle strength is normal. The patient is highly motivated to improve and hopes to play Pickleball again.  He has some restrictions into bilateral hip flexion assessed in supine.   Patient will benefit from skilled physical therapy intervention to address pain and deficits.   OBJECTIVE IMPAIRMENTS: decreased activity tolerance, decreased ROM, increased muscle spasms, postural dysfunction, and pain.   ACTIVITY LIMITATIONS: carrying, lifting, and bending  PARTICIPATION LIMITATIONS: cleaning  REHAB POTENTIAL: Excellent  CLINICAL DECISION MAKING: Stable/uncomplicated  EVALUATION COMPLEXITY: Low   GOALS:  SHORT TERM GOALS: Target date: 01/24/23  Ind with a HEP. Goal status: INITIAL  2.  Perform ADL's with pain not > 2-3/10.  Goal status: INITIAL  3.  Return to recreational activities.  Goal status: INITIAL Baseline:  Goal status: INITIAL   PLAN:  PT FREQUENCY: 2x/week  PT DURATION: 4 weeks  PLANNED INTERVENTIONS: Therapeutic exercises, Therapeutic activity, Neuromuscular re-education, Patient/Family education, Self Care, Electrical stimulation, Cryotherapy, Moist heat, Ultrasound, and Manual therapy.  PLAN FOR NEXT SESSION: STW/M, Core exercise progression, spinal protection techniques and body mechanics training.   Laterrica Libman, [place], PT 12/27/2022, 11:30 AM

## 2022-12-29 ENCOUNTER — Ambulatory Visit: Payer: Medicare Other | Admitting: *Deleted

## 2022-12-29 DIAGNOSIS — M62830 Muscle spasm of back: Secondary | ICD-10-CM | POA: Diagnosis not present

## 2022-12-29 DIAGNOSIS — M25561 Pain in right knee: Secondary | ICD-10-CM | POA: Diagnosis not present

## 2022-12-29 DIAGNOSIS — M5459 Other low back pain: Secondary | ICD-10-CM | POA: Diagnosis not present

## 2022-12-29 NOTE — Therapy (Signed)
OUTPATIENT PHYSICAL THERAPY THORACOLUMBAR EVALUATION   Patient Name: Jordan [name] MRN: [id number] DOB:1945-12-24, 77 y.o., male Today's Date: 12/29/2022  END OF SESSION:  PT End of Session - 12/29/22 1031     Visit Number 2    Number of Visits 8    Authorization Type FOTO.    PT Start Time 1015    PT Stop Time 1105    PT Time Calculation (min) 50 min             Past Medical History:  Diagnosis Date   Anxiety    Mild   Atrial fibrillation (HCC)    Paroxysmal   BPH (benign prostatic hyperplasia)    CAD (coronary artery disease)    Stent LAD, 2002  /  nuclear 2009, no ischemia  /    Hospital October, 2011 nuclear, no ischemia, possible slight apical scar, ejection fraction 70%   Cancer (HCC)    prostate CA-non aggressive   Carotid bruit    Doppler November, 2011, normal   Chest pain 02/23/2013   Chronic anticoagulation    Low CHADS , score, but patient wants to be aggressive   Colon polyps    COVID-19    + long Covid problems   Drug intolerance    Mild beta blocker intolerance   Dyslipidemia    Ejection fraction    EF 65-70%, echo, 2007 /  EF 70%, nuclear, 2011   Hyperlipidemia    IBS (irritable bowel syndrome)    Incomplete RBBB    Knee pain    left   Personal history of colonic adenomas 11/29/2006   Qualifier: Diagnosis of  By: Genelle Gather CMA, [place]     Sleep apnea    Syncope    November, 2010   Past Surgical History:  Procedure Laterality Date   ATRIAL FIBRILLATION ABLATION N/A 10/25/2021   Procedure: ATRIAL FIBRILLATION ABLATION;  Surgeon: Regan Lemming, MD;  Location: [facility];  Service: Cardiovascular;  Laterality: N/A;   BACK SURGERY  05/1990   L4L5S1   CARDIAC CATHETERIZATION     COLONOSCOPY  multiple   LUMBAR LAMINECTOMY/ DECOMPRESSION WITH MET-RX Left 09/13/2022   Procedure: Minimally Invasive  Laminectomy, Medical Facetectomy ,Microdiscectomy Left Lumbar Four-Five;  Surgeon: Bedelia Person, MD;  Location: [facility] OR;  Service:  Neurosurgery;  Laterality: Left;  3C   PROSTATE SURGERY     UMBILICAL HERNIA REPAIR  07/15/2021   [facility]   XI ROBOTIC ASSISTED SIMPLE PROSTATECTOMY  2022   Patient Active Problem List   Diagnosis Date Noted   Back pain of lumbosacral region with sciatica 10/16/2022   Other spondylosis with radiculopathy, lumbar region 09/13/2022   History of meniscal tear 10/18/2021   Hypercoagulable state due to paroxysmal atrial fibrillation (HCC) 07/12/2021   OSA on CPAP 04/23/2020   Abnormal dreams 01/21/2020   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HCC) 12/22/2019   Long term (current) use of anticoagulants 06/24/2015   Coronary artery disease involving native coronary artery of native heart without angina pectoris 06/24/2015   Facet degeneration of lumbar region 08/26/2012   Lumbar radiculopathy 08/26/2012   Osteoarthritis of right knee 03/26/2011   Traumatic tear of lateral meniscus of right knee 03/26/2011   Ejection fraction    Atrial fibrillation (HCC)    Drug intolerance    Incomplete RBBB    Carotid bruit    Knee pain    Anxiety    hyperlipidemia    Palpitations    Benign prostatic hyperplasia 02/06/2009  Personal history of colonic adenomas 11/29/2006    REFERRING PROVIDER: Hoyt Koch MD  REFERRING DIAG: Lumbar back pain.  S/p left lumbar microdiskectomy L4-5.  Rationale for Evaluation and Treatment: Rehabilitation  THERAPY DIAG:  Other low back pain  Muscle spasm of back  Acute pain of right knee  ONSET DATE: Ongoing, surgery (09/13/2022).  SUBJECTIVE:                                                                                                                                                                                           SUBJECTIVE STATEMENT: The patient presents to the clinic s/p left lumbar microdiskectomy L4-5. Doing better  PERTINENT HISTORY:  Prior lumbar surgery (1991), h/o bilateral knee pain.  PAIN:  Are you having pain? Yes: NPRS scale:  1-4/10 Pain location: Left low back. Pain description: Sore Aggravating factors: As above. Relieving factors: As above.  PRECAUTIONS: Other: Lumbar diskectomy protocol.   WEIGHT BEARING RESTRICTIONS: No  FALLS:  Has patient fallen in last 6 months? No  LIVING ENVIRONMENT: Lives with: lives with their spouse Lives in: House/apartment Has following equipment at home: None  PLOF: Independent with basic ADLs  PATIENT GOALS: Play Pickleball.   OBJECTIVE:   POSTURE: rounded shoulders, forward head, and decreased lumbar lordosis  PALPATION: Tender to palpation over left lower lumbar musculature.  Incision appears to be healing well.    LOWER EXTREMITY ROM:     In supine:  Left hip flexion is 100 degrees and right is 105 degrees.  LOWER EXTREMITY MMT:    Normal bilateral knee and ankle strength.   GAIT: The patient walks in some trunk flexion.  TODAY'S TREATMENT:                                                                                                                              DATE:  12-29-22                                      EXERCISE LOG  Exercise Repetitions and Resistance Comments  Nustep X 15 mins    AB bracing X10 hold secs   Hip flexor stretch X 3 hold 30 secs   Standing mod. Bird-dog X6 hold hold 5 secs each arm, x6 hold 5 secs each LE        Blank cell = exercise not performed today  Reviewed  HEP. Discussed AB bracing with transitional movements and movement patterns to decrease pain triggers  PATIENT EDUCATION:   HOME EXERCISE PROGRAM:   ASSESSMENT:  CLINICAL IMPRESSION: The patient presents to OPPT s/p left lumbar diskectomy performed at L4-5 on 09/13/22. Rx focused on AB bracing with transitional movements as well as  movement patterns to decrease pain triggers with ADL's. Pt was also instructed in core activation and strengthening exs. Handout given    OBJECTIVE  IMPAIRMENTS: decreased activity tolerance, decreased ROM, increased muscle spasms, postural dysfunction, and pain.   ACTIVITY LIMITATIONS: carrying, lifting, and bending  PARTICIPATION LIMITATIONS: cleaning  REHAB POTENTIAL: Excellent  CLINICAL DECISION MAKING: Stable/uncomplicated  EVALUATION COMPLEXITY: Low   GOALS:  SHORT TERM GOALS: Target date: 01/24/23  Ind with a HEP. Goal status: INITIAL  2.  Perform ADL's with pain not > 2-3/10.  Goal status: INITIAL  3.  Return to recreational activities.  Goal status: INITIAL Baseline:  Goal status: INITIAL   PLAN:  PT FREQUENCY: 2x/week  PT DURATION: 4 weeks  PLANNED INTERVENTIONS: Therapeutic exercises, Therapeutic activity, Neuromuscular re-education, Patient/Family education, Self Care, Electrical stimulation, Cryotherapy, Moist heat, Ultrasound, and Manual therapy.  PLAN FOR NEXT SESSION: STW/M, Core exercise progression, spinal protection techniques and body mechanics training.   Daijon Wenke,CHRIS, PTA 12/29/2022, 1:16 PM

## 2023-01-01 ENCOUNTER — Ambulatory Visit
Admission: RE | Admit: 2023-01-01 | Discharge: 2023-01-01 | Disposition: A | Discharge status: Home / Self Care | Payer: Medicare Other | Source: Ambulatory Visit | Attending: Family Medicine | Admitting: Family Medicine

## 2023-01-01 ENCOUNTER — Ambulatory Visit: Admission: RE | Admit: 2023-01-01 | Payer: Medicare Other | Source: Ambulatory Visit

## 2023-01-01 DIAGNOSIS — M7122 Synovial cyst of popliteal space [Baker], left knee: Secondary | ICD-10-CM | POA: Diagnosis not present

## 2023-01-01 DIAGNOSIS — I872 Venous insufficiency (chronic) (peripheral): Secondary | ICD-10-CM | POA: Diagnosis not present

## 2023-01-01 DIAGNOSIS — I83893 Varicose veins of bilateral lower extremities with other complications: Secondary | ICD-10-CM | POA: Diagnosis not present

## 2023-01-01 DIAGNOSIS — I862 Pelvic varices: Secondary | ICD-10-CM | POA: Diagnosis not present

## 2023-01-01 DIAGNOSIS — M7121 Synovial cyst of popliteal space [Baker], right knee: Secondary | ICD-10-CM | POA: Diagnosis not present

## 2023-01-01 NOTE — Consult Note (Signed)
Chief Complaint: Patient was seen in consultation today for venous insufficiency  Referring Physician(s): Gottschalk,Ashly M  History of Present Illness: Jordan [name] is a 77 y.o. male with history of bilateral chronic venous disease.  He presents today for ultrasound evaluation as the spouse to a known patient of mine who has discussed possibly evaluating his legs for venous problems while seeing his wife in clinic previously.  He complains of left worse than right lower extremity discomfort and swelling.  His pain is most severe in the bilateral popliteal regions.  He has bilateral hyperpigmentation below the calf.  He wears compression stockings regularly, and has severe symptoms when he doesn't.  He endorses pruritus in the lower legs bilaterally.  He reports "spider veins" in the lower legs bilaterally.  He denies any prior ulceration, weeping, or non-healing wound.  He is on eliquis for history of atrial fibrillation and CAD status pos stent placement in 2002.    Past Medical History:  Diagnosis Date   Anxiety    Mild   Atrial fibrillation (HCC)    Paroxysmal   BPH (benign prostatic hyperplasia)    CAD (coronary artery disease)    Stent LAD, 2002  /  nuclear 2009, no ischemia  /    Hospital October, 2011 nuclear, no ischemia, possible slight apical scar, ejection fraction 70%   Cancer (HCC)    prostate CA-non aggressive   Carotid bruit    Doppler November, 2011, normal   Chest pain 02/23/2013   Chronic anticoagulation    Low CHADS , score, but patient wants to be aggressive   Colon polyps    COVID-19    + long Covid problems   Drug intolerance    Mild beta blocker intolerance   Dyslipidemia    Ejection fraction    EF 65-70%, echo, 2007 /  EF 70%, nuclear, 2011   Hyperlipidemia    IBS (irritable bowel syndrome)    Incomplete RBBB    Knee pain    left   Personal history of colonic adenomas 11/29/2006   Qualifier: Diagnosis of  By: Genelle Gather CMA, [place]     Sleep  apnea    Syncope    November, 2010    Past Surgical History:  Procedure Laterality Date   ATRIAL FIBRILLATION ABLATION N/A 10/25/2021   Procedure: ATRIAL FIBRILLATION ABLATION;  Surgeon: Regan Lemming, MD;  Location: [facility];  Service: Cardiovascular;  Laterality: N/A;   BACK SURGERY  05/1990   [id number]   CARDIAC CATHETERIZATION     COLONOSCOPY  multiple   LUMBAR LAMINECTOMY/ DECOMPRESSION WITH MET-RX Left 09/13/2022   Procedure: Minimally Invasive  Laminectomy, Medical Facetectomy ,Microdiscectomy Left Lumbar Four-Five;  Surgeon: Bedelia Person, MD;  Location: [facility] OR;  Service: Neurosurgery;  Laterality: Left;  3C   PROSTATE SURGERY     UMBILICAL HERNIA REPAIR  07/15/2021   [facility]   XI ROBOTIC ASSISTED SIMPLE PROSTATECTOMY  2022    Allergies: Levofloxacin, Lopid [gemfibrozil], Sulfonamide derivatives, and Sulfa antibiotics  Medications: Prior to Admission medications   Medication Sig Start Date End Date Taking? Authorizing Provider  amoxicillin-clavulanate (AUGMENTIN) 875-125 MG tablet Take 1 tablet by mouth 2 (two) times daily. Take all of this medication 12/27/22   Mechele Claude, MD  apixaban (ELIQUIS) 5 MG TABS tablet TAKE  (1)  TABLET TWICE A DAY. 10/18/22   Delynn Flavin M, DO  atorvastatin (LIPITOR) 80 MG tablet TAKE ONE TABLET ONCE DAILY AT [facility] 10/18/22   Delynn Flavin  M, DO  Calcium Carb-Cholecalciferol (CALCIUM 600+D3 PO) Take 1 capsule by mouth daily.    [provider]  carboxymethylcellulose (REFRESH PLUS) 0.5 % SOLN Place 1 drop into both eyes 3 (three) times daily as needed (dry eyes).    [provider]  Cholecalciferol (VITAMIN D3) 1000 UNITS CAPS Take 1,000 Units by mouth daily.    [provider]  dapsone 100 MG tablet Take 1 tablet (100 mg total) by mouth daily. 12/27/22   Mechele Claude, MD  diltiazem (CARDIZEM CD) 180 MG 24 hr capsule Take 1 capsule (180 mg total) by mouth daily. 10/18/22   Raliegh Ip, DO   diltiazem (CARDIZEM) 60 MG tablet Take 1 tablet (60 mg total) by mouth as needed. 10/18/22   Raliegh Ip, DO  fluticasone (FLONASE) 50 MCG/ACT nasal spray Place 2 sprays into both nostrils daily. 11/01/22   Delynn Flavin M, DO  GLUCOSAMINE-CHONDROITIN PO Take 1,500 mg by mouth daily.    [provider]  Multiple Vitamins-Minerals (MULTIVITAMIN WITH MINERALS) tablet Take 1 tablet by mouth daily.    [provider]  nitroGLYCERIN (NITROSTAT) 0.4 MG SL tablet Place 1 tablet (0.4 mg total) under the tongue every 5 (five) minutes as needed for chest pain. 10/18/22   Raliegh Ip, DO  NON FORMULARY Pt uses a cpap nightly    [provider]  Probiotic Product (ALIGN PO) Take 1 capsule by mouth daily.    [provider]  triamcinolone cream (KENALOG) 0.1 % Apply 1 Application topically 2 (two) times daily. 10/18/22   Raliegh Ip, DO     Family History  Problem Relation Age of Onset   Heart attack Mother    Diabetes Mother    Macular degeneration Sister    Obesity Sister    Paranoid behavior Sister    Dementia Sister    Cancer Sister        lung - uterus    COPD Sister    Heart attack Brother    Heart disease Brother        cause of death heart attack   Cancer Daughter        colon    Stroke Maternal Grandfather    Stroke Paternal Grandfather    Colon cancer Neg Hx     Social History   Socioeconomic History   Marital status: Married    Spouse name: [name]    Number of children: 3   Years of education: bachelors   Highest education level: Bachelor's degree (e.g., BA, AB, BS)  Occupational History   Occupation: retired    Comment: [employer] worker [employer]  Tobacco Use   Smoking status: Never   Smokeless tobacco: Never   Tobacco comments:    Never smoke 07/27/22  Vaping Use   Vaping status: Never Used  Substance and Sexual Activity   Alcohol use: No   Drug use: No   Sexual activity: Yes  Other Topics  Concern   Not on file  Social History Narrative   Married to [place], he has children and grandchildren.   [place], raises chickens, retired Copywriter, advertising    Financially cares for his disabled nephew and recently started caring for his disabled sister who resides in a nursing facility in [place]   Social Determinants of Health   Financial Resource Strain: Low Risk  (11/23/2022)   Overall Financial Resource Strain (CARDIA)    Difficulty of Paying Living Expenses: Not hard at all  Food Insecurity: No Food Insecurity (11/23/2022)   Hunger Vital Sign    Worried About Running Out of Food in the Last Year: Never true    Ran Out of Food in the Last Year: Never true  Transportation Needs: No Transportation Needs (11/23/2022)   PRAPARE - Administrator, Civil Service (Medical): No    Lack of Transportation (Non-Medical): No  Physical Activity: Sufficiently Active (11/23/2022)   Exercise Vital Sign    Days of Exercise per Week: 5 days    Minutes of Exercise per Session: 30 min  Stress: No Stress Concern Present (11/23/2022)   [employer] of Occupational Health - Occupational Stress Questionnaire    Feeling of Stress : Not at all  Social Connections: Socially Integrated (11/23/2022)   Social Connection and Isolation Panel [NHANES]    Frequency of Communication with Friends and Family: More than three times a week    Frequency of Social Gatherings with Friends and Family: More than three times a week    Attends Religious Services: More than 4 times per year    Active Member of [employer] or Organizations: Yes    Attends Engineer, structural: More than 4 times per year    Marital Status: Married    Review of Systems: A 12 point ROS discussed and pertinent positives are indicated in the HPI above.  All other systems are negative.   Vital Signs: There were no vitals taken for this visit.  Advance Care Plan: The advanced care plan/surrogate decision maker was  discussed at the time of visit and documented in the medical record.    Physical Exam Constitutional:      General: He is not in acute distress. HENT:     Head: Normocephalic.     Mouth/Throat:     Mouth: Mucous membranes are moist.  Eyes:     General: No scleral icterus. Cardiovascular:     Rate and Rhythm: Normal rate and regular rhythm.  Pulmonary:     Breath sounds: Normal breath sounds.  Abdominal:     General: There is no distension.  Musculoskeletal:     Right lower leg: No edema.     Left lower leg: Edema present.  Skin:      Neurological:     Mental Status: He is alert.     Imaging: Lower extremity venous duplex (01/01/23) IMPRESSION: 1. There is left greater than right bilateral lower extremity superficial venous insufficiency. 2. Extensive superficial perforating vein network in the left lower extremity with reflux observed at the saphenofemoral junction, central greater saphenous vein, and greater saphenous vein in the mid calf. There is also reflux in the short saphenous vein extending into the vein of Giacomini. 3. Superficial venous reflux in the right lower extremity is limited to the saphenofemoral junction. 4. Bilateral perforating pelvic veins are present. 5. No evidence of bilateral lower extremity deep vein thrombosis.   FINDINGS: RIGHT Deep Venous System:  Evaluation of the deep venous system including the common femoral, femoral, profunda femoral, popliteal and calf veins (where visible) demonstrate no evidence of deep venous thrombosis. The vessels are compressible and demonstrate normal respiratory phasicity and response to augmentation. No evidence of deep venous reflux.  RIGHT Superficial Venous System:  SFJ: 8.3 mm, reflux measured at 0.9 seconds. A perforating drain is observed draining into the pelvis.  GSV Prox Thigh: 5 mm, no reflux. A perforator vein is seen draining into the central greater saphenous vein which measures 3.1  mm  in diameter with reflux of 1.8 seconds.  GSV Mid Thigh: No reflux noted.  GSV Lower Thigh: No reflux noted.  GSV Prox Calf: No reflux noted.  GSV Mid Calf: No reflux noted. There is an incompetent perforating vein identified.  GSV Distal Calf: No reflux.  SPJ: No reflux noted. 6.8 mm diameter.  SSV Prox: No reflux noted. 4.6 mm in diameter.  SSV Mid: No reflux noted.  SSV Distal: No reflux noted.  Other: Simple appearing fluid collection in the popliteal fossa measuring up to approximately 3.8 cm.  LEFT Deep Venous System:  Evaluation of the deep venous system including the common femoral, femoral, profunda femoral, popliteal and calf veins (where visible) demonstrate no evidence of deep venous thrombosis. The vessels are compressible and demonstrate normal respiratory phasicity and response to augmentation. No evidence of deep venous reflux.  LEFT Superficial Venous System:  SFJ: 9.6 mm, 5 seconds of reflux observed. The perforating vein is observed draining into the pelvis.  GSV Prox Thigh: 4.2 mm, 0.83 seconds of reflux observed. There is a perforator vein draining into the central greater saphenous vein measures up to 4.5 mm in diameter.  GSV Mid Thigh: 4.4 mm, no reflux.  GSV Lower Thigh: No reflux noted.  GSV Prox Calf: No reflux noted.  GSV Mid Calf: 4 mm diameter, 2.9 seconds of reflux observed.  GSV Distal Calf: No reflux observed.  SPJ: 2.9 mm, no reflux observed. There are 2 incompetent perforator veins in the calf measuring up to 4.8 mm. There is central extension to the vein of Giacomini which in the calf measures up to 3.6 mm in diameter with reflux of 3.5 seconds.  SSV Prox: 4.4 mm, 3.3 seconds reflux observed.  SSV Mid: No reflux observed.  SSV Distal: No reflux observed peer  Other: Simple appearing fluid collection in the popliteal fossa measuring up to approximately 3.9 cm.   Labs:  CBC: Recent Labs    04/18/22 0941  09/05/22 1038  WBC 6.8 8.2  HGB 14.1 14.8  HCT 42.1 45.4  PLT 218 205    COAGS: No results for input(s): "INR", "APTT" in the last 8760 hours.  BMP: Recent Labs    09/05/22 1038 10/18/22 0937  NA 139 140  K 4.1 4.3  CL 104 102  CO2 29 23  GLUCOSE 94 90  BUN 19 22  CALCIUM 8.7* 9.0  CREATININE 0.98 0.97  GFRNONAA >60  --     LIVER FUNCTION TESTS: Recent Labs    10/18/22 0937  BILITOT 0.8  AST 27  ALT 25  ALKPHOS 78  PROT 6.6  ALBUMIN 3.8    TUMOR MARKERS: No results for input(s): "AFPTM", "CEA", "CA199", "CHROMGRNA" in the last 8760 hours.  Assessment and Plan: 77 year old male with bilateral, left greater than right, lower extremity superficial venous reflux.  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  Sonographically, he has bilateral pelvic draining veins, which raises some suspicion for pelvic compressive etiology such as May-Thurner.    -CTV abdomen/pelvis to evaluate for venous outflow compromise -if no pelvic venous disorder noted, plan for left lower extremity greater saphenous vein EVLT at [facility] -plan for bilateral Baker cyst aspiration with steroid injection at time of EVLT  Electronically Signed: Bennie Dallas, MD 01/01/2023, 9:38 AM   I spent a total of  40 Minutes in face to face in clinical consultation, greater than 50% of which was counseling/coordinating care for venous insufficiency.

## 2023-01-02 ENCOUNTER — Ambulatory Visit: Payer: Medicare Other | Admitting: *Deleted

## 2023-01-02 ENCOUNTER — Other Ambulatory Visit: Payer: Self-pay | Admitting: Interventional Radiology

## 2023-01-02 DIAGNOSIS — M5459 Other low back pain: Secondary | ICD-10-CM | POA: Diagnosis not present

## 2023-01-02 DIAGNOSIS — M25561 Pain in right knee: Secondary | ICD-10-CM | POA: Diagnosis not present

## 2023-01-02 DIAGNOSIS — I872 Venous insufficiency (chronic) (peripheral): Secondary | ICD-10-CM

## 2023-01-02 DIAGNOSIS — M62830 Muscle spasm of back: Secondary | ICD-10-CM | POA: Diagnosis not present

## 2023-01-02 NOTE — Therapy (Signed)
OUTPATIENT PHYSICAL THERAPY THORACOLUMBAR EVALUATION   Patient Name: Jordan [name] MRN: [id number] DOB:03/14/1946, 77 y.o., male Today's Date: 01/02/2023  END OF SESSION:  PT End of Session - 01/02/23 1447     Visit Number 3    Number of Visits 8    Authorization Type FOTO.    PT Start Time 1430    PT Stop Time 1520    PT Time Calculation (min) 50 min             Past Medical History:  Diagnosis Date   Anxiety    Mild   Atrial fibrillation (HCC)    Paroxysmal   BPH (benign prostatic hyperplasia)    CAD (coronary artery disease)    Stent LAD, 2002  /  nuclear 2009, no ischemia  /    Hospital October, 2011 nuclear, no ischemia, possible slight apical scar, ejection fraction 70%   Cancer (HCC)    prostate CA-non aggressive   Carotid bruit    Doppler November, 2011, normal   Chest pain 02/23/2013   Chronic anticoagulation    Low CHADS , score, but patient wants to be aggressive   Colon polyps    COVID-19    + long Covid problems   Drug intolerance    Mild beta blocker intolerance   Dyslipidemia    Ejection fraction    EF 65-70%, echo, 2007 /  EF 70%, nuclear, 2011   Hyperlipidemia    IBS (irritable bowel syndrome)    Incomplete RBBB    Knee pain    left   Personal history of colonic adenomas 11/29/2006   Qualifier: Diagnosis of  By: Genelle Gather CMA, [place]     Sleep apnea    Syncope    November, 2010   Past Surgical History:  Procedure Laterality Date   ATRIAL FIBRILLATION ABLATION N/A 10/25/2021   Procedure: ATRIAL FIBRILLATION ABLATION;  Surgeon: Regan Lemming, MD;  Location: [facility];  Service: Cardiovascular;  Laterality: N/A;   BACK SURGERY  05/1990   L4L5S1   CARDIAC CATHETERIZATION     COLONOSCOPY  multiple   LUMBAR LAMINECTOMY/ DECOMPRESSION WITH MET-RX Left 09/13/2022   Procedure: Minimally Invasive  Laminectomy, Medical Facetectomy ,Microdiscectomy Left Lumbar Four-Five;  Surgeon: Bedelia Person, MD;  Location: [facility] OR;  Service:  Neurosurgery;  Laterality: Left;  3C   PROSTATE SURGERY     UMBILICAL HERNIA REPAIR  07/15/2021   [facility]   XI ROBOTIC ASSISTED SIMPLE PROSTATECTOMY  2022   Patient Active Problem List   Diagnosis Date Noted   Back pain of lumbosacral region with sciatica 10/16/2022   Other spondylosis with radiculopathy, lumbar region 09/13/2022   History of meniscal tear 10/18/2021   Hypercoagulable state due to paroxysmal atrial fibrillation (HCC) 07/12/2021   OSA on CPAP 04/23/2020   Abnormal dreams 01/21/2020   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HCC) 12/22/2019   Long term (current) use of anticoagulants 06/24/2015   Coronary artery disease involving native coronary artery of native heart without angina pectoris 06/24/2015   Facet degeneration of lumbar region 08/26/2012   Lumbar radiculopathy 08/26/2012   Osteoarthritis of right knee 03/26/2011   Traumatic tear of lateral meniscus of right knee 03/26/2011   Ejection fraction    Atrial fibrillation (HCC)    Drug intolerance    Incomplete RBBB    Carotid bruit    Knee pain    Anxiety    hyperlipidemia    Palpitations    Benign prostatic hyperplasia 02/06/2009  Personal history of colonic adenomas 11/29/2006    REFERRING PROVIDER: Hoyt Koch MD  REFERRING DIAG: Lumbar back pain.  S/p left lumbar microdiskectomy L4-5.  Rationale for Evaluation and Treatment: Rehabilitation  THERAPY DIAG:  Other low back pain  Muscle spasm of back  ONSET DATE: Ongoing, surgery (09/13/2022).  SUBJECTIVE:                                                                                                                                                                                           SUBJECTIVE STATEMENT: The patient presents to the clinic s/p left lumbar microdiskectomy L4-5. Did good after last Rx  PERTINENT HISTORY:  Prior lumbar surgery (1991), h/o bilateral knee pain.  PAIN:  Are you having pain? Yes: NPRS scale: 2/10 Pain location: Left  low back. Pain description: Sore Aggravating factors: As above. Relieving factors: As above.  PRECAUTIONS: Other: Lumbar diskectomy protocol.   WEIGHT BEARING RESTRICTIONS: No  FALLS:  Has patient fallen in last 6 months? No  LIVING ENVIRONMENT: Lives with: lives with their spouse Lives in: House/apartment Has following equipment at home: None  PLOF: Independent with basic ADLs  PATIENT GOALS: Play Pickleball.   OBJECTIVE:   POSTURE: rounded shoulders, forward head, and decreased lumbar lordosis  PALPATION: Tender to palpation over left lower lumbar musculature.  Incision appears to be healing well.    LOWER EXTREMITY ROM:     In supine:  Left hip flexion is 100 degrees and right is 105 degrees.  LOWER EXTREMITY MMT:    Normal bilateral knee and ankle strength.   GAIT: The patient walks in some trunk flexion.  TODAY'S TREATMENT:                                                                                                                              DATE:  01-02-23                                      EXERCISE LOG  Exercise Repetitions and Resistance Comments  Nustep X 15 mins    AB bracing X10 hold secs   Hip flexor stretch X 3 hold 30 secs with glute squeeze   Standing mod. Bird-dog X6 hold hold 5 secs each arm, x6 hold 5 secs each LE   Dying bug  X6 hold 5-10 secs each arm/ opp leg   Cat/ camel x10    Blank cell = exercise not performed today  Reviewed  HEP and Discussed AB bracing with transitional movements and movement patterns to decrease pain triggers with ADL's  PATIENT EDUCATION:   HOME EXERCISE PROGRAM:   ASSESSMENT:  CLINICAL IMPRESSION: The patient presents to [facility]PP[facility] s/p left lumbar diskectomy performed at L4-5 on 09/13/22. Pt arrived today doing fairly well with reports of doing okay after last Rx and 2/10 LBP. Rx focused on review of HEP and progressions. Pt did well  with dying bug, bridge, and cat and camel exs. Handout given   OBJECTIVE IMPAIRMENTS: decreased activity tolerance, decreased ROM, increased muscle spasms, postural dysfunction, and pain.   ACTIVITY LIMITATIONS: carrying, lifting, and bending  PARTICIPATION LIMITATIONS: cleaning  REHAB POTENTIAL: Excellent  CLINICAL DECISION MAKING: Stable/uncomplicated  EVALUATION COMPLEXITY: Low   GOALS:  SHORT TERM GOALS: Target date: 01/24/23  Ind with a HEP. Goal status: INITIAL  2.  Perform ADL's with pain not > 2-3/10.  Goal status: INITIAL  3.  Return to recreational activities.  Goal status: INITIAL Baseline:  Goal status: INITIAL   PLAN:  PT FREQUENCY: 2x/week  PT DURATION: 4 weeks  PLANNED INTERVENTIONS: Therapeutic exercises, Therapeutic activity, Neuromuscular re-education, Patient/Family education, Self Care, Electrical stimulation, Cryotherapy, Moist heat, Ultrasound, and Manual therapy.  PLAN FOR NEXT SESSION: STW/M, Core exercise progression, spinal protection techniques and body mechanics training.   Kathey Simer,CHRIS, PTA 01/02/2023, 3:28 PM [facility] at [facility] [address] [place], [place], [address] Phone: [phone]   Fax:  [phone]

## 2023-01-03 ENCOUNTER — Ambulatory Visit
Admission: RE | Admit: 2023-01-03 | Discharge: 2023-01-03 | Disposition: A | Discharge status: Home / Self Care | Payer: Medicare Other | Source: Ambulatory Visit | Attending: Interventional Radiology | Admitting: Interventional Radiology

## 2023-01-03 DIAGNOSIS — N281 Cyst of kidney, acquired: Secondary | ICD-10-CM | POA: Diagnosis not present

## 2023-01-03 DIAGNOSIS — I872 Venous insufficiency (chronic) (peripheral): Secondary | ICD-10-CM

## 2023-01-03 DIAGNOSIS — K7689 Other specified diseases of liver: Secondary | ICD-10-CM | POA: Diagnosis not present

## 2023-01-03 MED ORDER — IOPAMIDOL (ISOVUE-370) INJECTION 76%
100.0000 mL | Freq: Once | INTRAVENOUS | Status: AC | PRN
  Administered 2023-01-03: 100 mL via INTRAVENOUS

## 2023-01-04 ENCOUNTER — Ambulatory Visit: Payer: Medicare Other

## 2023-01-04 DIAGNOSIS — M25561 Pain in right knee: Secondary | ICD-10-CM | POA: Diagnosis not present

## 2023-01-04 DIAGNOSIS — M5459 Other low back pain: Secondary | ICD-10-CM

## 2023-01-04 DIAGNOSIS — M62830 Muscle spasm of back: Secondary | ICD-10-CM | POA: Diagnosis not present

## 2023-01-04 NOTE — Therapy (Signed)
OUTPATIENT PHYSICAL THERAPY THORACOLUMBAR TREATMENT   Patient Name: Jordan [name] MRN: [id number] DOB:Jan 14, 1946, 77 y.o., male Today's Date: 01/04/2023  END OF SESSION:  PT End of Session - 01/04/23 1021     Visit Number 4    Number of Visits 8    Date for PT Re-Evaluation 01/24/23    Authorization Type FOTO.    PT Start Time 1018    PT Stop Time 1059    PT Time Calculation (min) 41 min    Activity Tolerance Patient tolerated treatment well    Behavior During Therapy [facility] for tasks assessed/performed              Past Medical History:  Diagnosis Date   Anxiety    Mild   Atrial fibrillation (HCC)    Paroxysmal   BPH (benign prostatic hyperplasia)    CAD (coronary artery disease)    Stent LAD, 2002  /  nuclear 2009, no ischemia  /    Hospital October, 2011 nuclear, no ischemia, possible slight apical scar, ejection fraction 70%   Cancer (HCC)    prostate CA-non aggressive   Carotid bruit    Doppler November, 2011, normal   Chest pain 02/23/2013   Chronic anticoagulation    Low CHADS , score, but patient wants to be aggressive   Colon polyps    COVID-19    + long Covid problems   Drug intolerance    Mild beta blocker intolerance   Dyslipidemia    Ejection fraction    EF 65-70%, echo, 2007 /  EF 70%, nuclear, 2011   Hyperlipidemia    IBS (irritable bowel syndrome)    Incomplete RBBB    Knee pain    left   Personal history of colonic adenomas 11/29/2006   Qualifier: Diagnosis of  By: Genelle Gather CMA, [place]     Sleep apnea    Syncope    November, 2010   Past Surgical History:  Procedure Laterality Date   ATRIAL FIBRILLATION ABLATION N/A 10/25/2021   Procedure: ATRIAL FIBRILLATION ABLATION;  Surgeon: Regan Lemming, MD;  [facility]: [facility];  Service: Cardiovascular;  Laterality: N/A;   BACK SURGERY  05/1990   [id number]   CARDIAC CATHETERIZATION     COLONOSCOPY  multiple   LUMBAR LAMINECTOMY/ DECOMPRESSION WITH MET-RX Left 09/13/2022    Procedure: Minimally Invasive  Laminectomy, Medical Facetectomy ,Microdiscectomy Left Lumbar Four-Five;  Surgeon: Bedelia Person, MD;  Location: [facility] OR;  Service: Neurosurgery;  Laterality: Left;  3C   PROSTATE SURGERY     UMBILICAL HERNIA REPAIR  07/15/2021   [facility]   XI ROBOTIC ASSISTED SIMPLE PROSTATECTOMY  2022   Patient Active Problem List   Diagnosis Date Noted   Back pain of lumbosacral region with sciatica 10/16/2022   Other spondylosis with radiculopathy, lumbar region 09/13/2022   History of meniscal tear 10/18/2021   Hypercoagulable state due to paroxysmal atrial fibrillation (HCC) 07/12/2021   OSA on CPAP 04/23/2020   Abnormal dreams 01/21/2020   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HCC) 12/22/2019   Long term (current) use of anticoagulants 06/24/2015   Coronary artery disease involving native coronary artery of native heart without angina pectoris 06/24/2015   Facet degeneration of lumbar region 08/26/2012   Lumbar radiculopathy 08/26/2012   Osteoarthritis of right knee 03/26/2011   Traumatic tear of lateral meniscus of right knee 03/26/2011   Ejection fraction    Atrial fibrillation (HCC)    Drug intolerance    Incomplete RBBB  Carotid bruit    Knee pain    Anxiety    hyperlipidemia    Palpitations    Benign prostatic hyperplasia 02/06/2009   Personal history of colonic adenomas 11/29/2006    REFERRING PROVIDER: Hoyt Koch MD  REFERRING DIAG: Lumbar back pain.  S/p left lumbar microdiskectomy L4-5.  Rationale for Evaluation and Treatment: Rehabilitation  THERAPY DIAG:  Other low back pain  Muscle spasm of back  ONSET DATE: Ongoing, surgery (09/13/2022).  SUBJECTIVE:                                                                                                                                                                                           SUBJECTIVE STATEMENT: Patient reports that he feels good today.   PERTINENT HISTORY:  Prior lumbar  surgery (1991), h/o bilateral knee pain.  PAIN:  Are you having pain? Yes: NPRS scale: 2/10 Pain location: Left low back. Pain description: Sore Aggravating factors: As above. Relieving factors: As above.  PRECAUTIONS: Other: Lumbar diskectomy protocol.   WEIGHT BEARING RESTRICTIONS: No  FALLS:  Has patient fallen in last 6 months? No  LIVING ENVIRONMENT: Lives with: lives with their spouse Lives in: House/apartment Has following equipment at home: None  PLOF: Independent with basic ADLs  PATIENT GOALS: Play Pickleball.   OBJECTIVE:   POSTURE: rounded shoulders, forward head, and decreased lumbar lordosis  PALPATION: Tender to palpation over left lower lumbar musculature.  Incision appears to be healing well.  LOWER EXTREMITY ROM:     In supine:  Left hip flexion is 100 degrees and right is 105 degrees.  LOWER EXTREMITY MMT:    Normal bilateral knee and ankle strength.   GAIT: The patient walks in some trunk flexion.  TODAY'S TREATMENT:                                                                                                                              DATE:  01/04/23 EXERCISE LOG  Exercise Repetitions and Resistance Comments  Nustep L4 x 16 minutes   Bridge  20 reps     SLR  20 reps each    Supine clams  Green t-band x 30 reps    Modified dying bug  10 reps each    Wall squat  2.5 minutes    Slouch overcorrect 30 reps    Blank cell = exercise not performed today                                     01-02-23 EXERCISE LOG  Exercise Repetitions and Resistance Comments  Nustep X 15 mins    AB bracing X10 hold secs   Hip flexor stretch X 3 hold 30 secs with glute squeeze   Standing mod. Bird-dog X6 hold hold 5 secs each arm, x6 hold 5 secs each LE   Dying bug  X6 hold 5-10 secs each arm/ opp leg   Cat/ camel x10    Blank cell = exercise not  performed today  Reviewed  HEP and Discussed AB bracing with transitional movements and movement patterns to decrease pain triggers with ADL's  PATIENT EDUCATION:   HOME EXERCISE PROGRAM:   ASSESSMENT:  CLINICAL IMPRESSION: Patient was progressed with multiple new interventions for improved core stability and lumbar strength. He required maximal verbal and tactile cueing with dying bugs for proper upper and lower extremity mobility to facilitate core engagement. He experienced no increase in pain or discomfort with any of today's interventions. He reported feeling good upon the conclusion of treatment. He continues to require skilled physical therapy to address his remaining impairments to return to his prior level of function.   OBJECTIVE IMPAIRMENTS: decreased activity tolerance, decreased ROM, increased muscle spasms, postural dysfunction, and pain.   ACTIVITY LIMITATIONS: carrying, lifting, and bending  PARTICIPATION LIMITATIONS: cleaning  REHAB POTENTIAL: Excellent  CLINICAL DECISION MAKING: Stable/uncomplicated  EVALUATION COMPLEXITY: Low   GOALS:  SHORT TERM GOALS: Target date: 01/24/23  Ind with a HEP. Goal status: INITIAL  2.  Perform ADL's with pain not > 2-3/10.  Goal status: INITIAL  3.  Return to recreational activities.  Goal status: INITIAL Baseline:  Goal status: INITIAL   PLAN:  PT FREQUENCY: 2x/week  PT DURATION: 4 weeks  PLANNED INTERVENTIONS: Therapeutic exercises, Therapeutic activity, Neuromuscular re-education, Patient/Family education, Self Care, Electrical stimulation, Cryotherapy, Moist heat, Ultrasound, and Manual therapy.  PLAN FOR NEXT SESSION: STW/M, Core exercise progression, spinal protection techniques and body mechanics training.   Granville Lewis, PT 01/04/2023, 12:44 PM  [facility] at [facility] [address] [place], [place], [address] Phone: [phone]   Fax:  [phone]

## 2023-01-08 ENCOUNTER — Ambulatory Visit
Admission: RE | Admit: 2023-01-08 | Discharge: 2023-01-08 | Disposition: A | Discharge status: Home / Self Care | Payer: Medicare Other | Source: Ambulatory Visit | Attending: Interventional Radiology | Admitting: Interventional Radiology

## 2023-01-08 DIAGNOSIS — I872 Venous insufficiency (chronic) (peripheral): Secondary | ICD-10-CM

## 2023-01-08 DIAGNOSIS — M7122 Synovial cyst of popliteal space [Baker], left knee: Secondary | ICD-10-CM | POA: Diagnosis not present

## 2023-01-08 DIAGNOSIS — M7121 Synovial cyst of popliteal space [Baker], right knee: Secondary | ICD-10-CM | POA: Diagnosis not present

## 2023-01-08 HISTORY — PX: IR RADIOLOGIST EVAL & MGMT: IMG5224

## 2023-01-08 NOTE — Progress Notes (Signed)
Reason for follow up: Patient was seen in consultation today for venous insufficiency   Referring Physician(s): Gottschalk,Ashly M   History of Present Illness: Jordan [name] is a 77 y.o. male with history of bilateral chronic venous disease.  He presents today for ultrasound evaluation as the spouse to a known patient of mine who has discussed possibly evaluating his legs for venous problems while seeing his wife in clinic previously.  He complains of left worse than right lower extremity discomfort and swelling.  His pain is most severe in the bilateral popliteal regions.  He has bilateral hyperpigmentation below the calf.  He wears compression stockings regularly, and has severe symptoms when he doesn't.  He endorses pruritus in the lower legs bilaterally.  He reports "spider veins" in the lower legs bilaterally.  He denies any prior ulceration, weeping, or non-healing wound.  He is on eliquis for history of atrial fibrillation and CAD status post stent placement in 2002.    He presents today to follow up recent CTV AP results and discuss possible treatment options.  No changes in health or lower extremity symptoms since last visit.   Past Medical History:  Diagnosis Date   Anxiety    Mild   Atrial fibrillation (HCC)    Paroxysmal   BPH (benign prostatic hyperplasia)    CAD (coronary artery disease)    Stent LAD, 2002  /  nuclear 2009, no ischemia  /    Hospital October, 2011 nuclear, no ischemia, possible slight apical scar, ejection fraction 70%   Cancer (HCC)    prostate CA-non aggressive   Carotid bruit    Doppler November, 2011, normal   Chest pain 02/23/2013   Chronic anticoagulation    Low CHADS , score, but patient wants to be aggressive   Colon polyps    COVID-19    + long Covid problems   Drug intolerance    Mild beta blocker intolerance   Dyslipidemia    Ejection fraction    EF 65-70%, echo, 2007 /  EF 70%, nuclear, 2011   Hyperlipidemia    IBS (irritable  bowel syndrome)    Incomplete RBBB    Knee pain    left   Personal history of colonic adenomas 11/29/2006   Qualifier: Diagnosis of  By: Genelle Gather CMA, [place]     Sleep apnea    Syncope    November, 2010    Past Surgical History:  Procedure Laterality Date   ATRIAL FIBRILLATION ABLATION N/A 10/25/2021   Procedure: ATRIAL FIBRILLATION ABLATION;  Surgeon: Regan Lemming, MD;  Location: [facility];  Service: Cardiovascular;  Laterality: N/A;   BACK SURGERY  05/1990   L4L5S1   CARDIAC CATHETERIZATION     COLONOSCOPY  multiple   LUMBAR LAMINECTOMY/ DECOMPRESSION WITH MET-RX Left 09/13/2022   Procedure: Minimally Invasive  Laminectomy, Medical Facetectomy ,Microdiscectomy Left Lumbar Four-Five;  Surgeon: Bedelia Person, MD;  Location: [facility] OR;  Service: Neurosurgery;  Laterality: Left;  3C   PROSTATE SURGERY     UMBILICAL HERNIA REPAIR  07/15/2021   [facility]   XI ROBOTIC ASSISTED SIMPLE PROSTATECTOMY  2022    Allergies: Levofloxacin, Lopid [gemfibrozil], Sulfonamide derivatives, and Sulfa antibiotics  Medications: Prior to Admission medications   Medication Sig Start Date End Date Taking? Authorizing Provider  amoxicillin-clavulanate (AUGMENTIN) 875-125 MG tablet Take 1 tablet by mouth 2 (two) times daily. Take all of this medication 12/27/22   Mechele Claude, MD  apixaban (ELIQUIS) 5 MG TABS tablet  TAKE  (1)  TABLET TWICE A DAY. 10/18/22   Delynn Flavin M, DO  atorvastatin (LIPITOR) 80 MG tablet TAKE ONE TABLET ONCE DAILY AT 6PM 10/18/22   Delynn Flavin M, DO  Calcium Carb-Cholecalciferol (CALCIUM 600+D3 PO) Take 1 capsule by mouth daily.    [provider]  carboxymethylcellulose (REFRESH PLUS) 0.5 % SOLN Place 1 drop into both eyes 3 (three) times daily as needed (dry eyes).    [provider]  Cholecalciferol (VITAMIN D3) 1000 UNITS CAPS Take 1,000 Units by mouth daily.    [provider]  dapsone 100 MG tablet Take 1 tablet (100 mg total) by  mouth daily. 12/27/22   Mechele Claude, MD  diltiazem (CARDIZEM CD) 180 MG 24 hr capsule Take 1 capsule (180 mg total) by mouth daily. 10/18/22   Raliegh Ip, DO  diltiazem (CARDIZEM) 60 MG tablet Take 1 tablet (60 mg total) by mouth as needed. 10/18/22   Raliegh Ip, DO  fluticasone (FLONASE) 50 MCG/ACT nasal spray Place 2 sprays into both nostrils daily. 11/01/22   Delynn Flavin M, DO  GLUCOSAMINE-CHONDROITIN PO Take 1,500 mg by mouth daily.    [provider]  Multiple Vitamins-Minerals (MULTIVITAMIN WITH MINERALS) tablet Take 1 tablet by mouth daily.    [provider]  nitroGLYCERIN (NITROSTAT) 0.4 MG SL tablet Place 1 tablet (0.4 mg total) under the tongue every 5 (five) minutes as needed for chest pain. 10/18/22   Raliegh Ip, DO  NON FORMULARY Pt uses a cpap nightly    [provider]  Probiotic Product (ALIGN PO) Take 1 capsule by mouth daily.    [provider]  triamcinolone cream (KENALOG) 0.1 % Apply 1 Application topically 2 (two) times daily. 10/18/22   Raliegh Ip, DO     Family History  Problem Relation Age of Onset   Heart attack Mother    Diabetes Mother    Macular degeneration Sister    Obesity Sister    Paranoid behavior Sister    Dementia Sister    Cancer Sister        lung - uterus    COPD Sister    Heart attack Brother    Heart disease Brother        cause of death heart attack   Cancer Daughter        colon    Stroke Maternal Grandfather    Stroke Paternal Grandfather    Colon cancer Neg Hx     Social History   Socioeconomic History   Marital status: Married    Spouse name: [name]    Number of children: 3   Years of education: bachelors   Highest education level: Bachelor's degree (e.g., BA, AB, BS)  Occupational History   Occupation: retired    Comment: Advice worker [employer]  Tobacco Use   Smoking status: Never   Smokeless tobacco: Never   Tobacco comments:    Never  smoke 07/27/22  Vaping Use   Vaping status: Never Used  Substance and Sexual Activity   Alcohol use: No   Drug use: No   Sexual activity: Yes  Other Topics Concern   Not on file  Social History Narrative   Married to [place], he has children and grandchildren.   [place], raises chickens, retired Copywriter, advertising    Financially cares for his disabled nephew and recently started caring for his disabled sister who resides in a nursing facility in [place]  Social Determinants of Health   Financial Resource Strain: Low Risk  (11/23/2022)   Overall Financial Resource Strain (CARDIA)    Difficulty of Paying Living Expenses: Not hard at all  Food Insecurity: No Food Insecurity (11/23/2022)   Hunger Vital Sign    Worried About Running Out of Food in the Last Year: Never true    Ran Out of Food in the Last Year: Never true  Transportation Needs: No Transportation Needs (11/23/2022)   PRAPARE - Administrator, Civil Service (Medical): No    Lack of Transportation (Non-Medical): No  Physical Activity: Sufficiently Active (11/23/2022)   Exercise Vital Sign    Days of Exercise per Week: 5 days    Minutes of Exercise per Session: 30 min  Stress: No Stress Concern Present (11/23/2022)   [employer] of Occupational Health - Occupational Stress Questionnaire    Feeling of Stress : Not at all  Social Connections: Socially Integrated (11/23/2022)   Social Connection and Isolation Panel [NHANES]    Frequency of Communication with Friends and Family: More than three times a week    Frequency of Social Gatherings with Friends and Family: More than three times a week    Attends Religious Services: More than 4 times per year    Active Member of [employer] or Organizations: Yes    Attends Engineer, structural: More than 4 times per year    Marital Status: Married     Vital Signs: There were no vitals taken for this visit.  Physical Exam Constitutional:      General:  He is not in acute distress. HENT:     Head: Normocephalic.     Mouth/Throat:     Mouth: Mucous membranes are moist.  Eyes:     General: No scleral icterus. Cardiovascular:     Rate and Rhythm: Normal rate and regular rhythm.  Pulmonary:     Effort: Pulmonary effort is normal. No respiratory distress.  Abdominal:     General: There is no distension.  Musculoskeletal:     Right lower leg: No edema.     Left lower leg: No edema.  Skin:    General: Skin is warm.     Comments: Venous stasis hyperpigmentation in the bilateral lower legs.  Neurological:     Mental Status: He is alert and oriented to person, place, and time.     Imaging: Lower extremity venous duplex (01/01/23) IMPRESSION: 1. There is left greater than right bilateral lower extremity superficial venous insufficiency. 2. Extensive superficial perforating vein network in the left lower extremity with reflux observed at the saphenofemoral junction, central greater saphenous vein, and greater saphenous vein in the mid calf. There is also reflux in the short saphenous vein extending into the vein of Giacomini. 3. Superficial venous reflux in the right lower extremity is limited to the saphenofemoral junction. 4. Bilateral perforating pelvic veins are present. 5. No evidence of bilateral lower extremity deep vein thrombosis.    FINDINGS: RIGHT Deep Venous System:  Evaluation of the deep venous system including the common femoral, femoral, profunda femoral, popliteal and calf veins (where visible) demonstrate no evidence of deep venous thrombosis. The vessels are compressible and demonstrate normal respiratory phasicity and response to augmentation. No evidence of deep venous reflux.  RIGHT Superficial Venous System:  SFJ: 8.3 mm, reflux measured at 0.9 seconds. A perforating drain is observed draining into the pelvis.  GSV Prox Thigh: 5 mm, no reflux. A perforator vein is seen draining  into the central greater  saphenous vein which measures 3.1 mm in diameter with reflux of 1.8 seconds.  GSV Mid Thigh: No reflux noted.  GSV Lower Thigh: No reflux noted.  GSV Prox Calf: No reflux noted.  GSV Mid Calf: No reflux noted. There is an incompetent perforating vein identified.  GSV Distal Calf: No reflux.  SPJ: No reflux noted. 6.8 mm diameter.  SSV Prox: No reflux noted. 4.6 mm in diameter.  SSV Mid: No reflux noted.  SSV Distal: No reflux noted.  Other: Simple appearing fluid collection in the popliteal fossa measuring up to approximately 3.8 cm.  LEFT Deep Venous System:  Evaluation of the deep venous system including the common femoral, femoral, profunda femoral, popliteal and calf veins (where visible) demonstrate no evidence of deep venous thrombosis. The vessels are compressible and demonstrate normal respiratory phasicity and response to augmentation. No evidence of deep venous reflux.  LEFT Superficial Venous System:  SFJ: 9.6 mm, 5 seconds of reflux observed. The perforating vein is observed draining into the pelvis.  GSV Prox Thigh: 4.2 mm, 0.83 seconds of reflux observed. There is a perforator vein draining into the central greater saphenous vein measures up to 4.5 mm in diameter.  GSV Mid Thigh: 4.4 mm, no reflux.  GSV Lower Thigh: No reflux noted.  GSV Prox Calf: No reflux noted.  GSV Mid Calf: 4 mm diameter, 2.9 seconds of reflux observed.  GSV Distal Calf: No reflux observed.  SPJ: 2.9 mm, no reflux observed. There are 2 incompetent perforator veins in the calf measuring up to 4.8 mm. There is central extension to the vein of Giacomini which in the calf measures up to 3.6 mm in diameter with reflux of 3.5 seconds.  SSV Prox: 4.4 mm, 3.3 seconds reflux observed.  SSV Mid: No reflux observed.  SSV Distal: No reflux observed peer  Other: Simple appearing fluid collection in the popliteal fossa measuring up to approximately 3.9 cm.    CTV AP   IMPRESSION: VASCULAR No evidence of pelvic venous disorder.  NON-VASCULAR 1. Benign-appearing simple hepatic and renal cysts are visualized as described. Neither of which require additional follow-up. 2. Evidence of prior granulomatous disease.     Labs:  CBC: Recent Labs    04/18/22 0941 09/05/22 1038  WBC 6.8 8.2  HGB 14.1 14.8  HCT 42.1 45.4  PLT 218 205    COAGS: No results for input(s): "INR", "APTT" in the last 8760 hours.  BMP: Recent Labs    09/05/22 1038 10/18/22 0937  NA 139 140  K 4.1 4.3  CL 104 102  CO2 29 23  GLUCOSE 94 90  BUN 19 22  CALCIUM 8.7* 9.0  CREATININE 0.98 0.97  GFRNONAA >60  --     LIVER FUNCTION TESTS: Recent Labs    10/18/22 0937  BILITOT 0.8  AST 27  ALT 25  ALKPHOS 78  PROT 6.6  ALBUMIN 3.8    Assessment and Plan: 77 year old male with bilateral, left greater than right, lower extremity superficial venous reflux (C4EpAs/pPr).  He has used compression stockings for years without resolution of symptoms.  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  Bilateral pelvic draining veins are visualized sonographically, however on CTV there are no significant pelvic collaterals or evidence of pelvic venous disorder.     -plan for left lower extremity greater saphenous vein EVLT at [facility] LB -plan for bilateral Baker cyst aspiration with steroid injection at time of EVLT  Electronically Signed: Thressa Sheller Lovena Kluck 01/08/2023, 9:07 AM   I spent a total of 25 Minutes in face to face in clinical consultation, greater than 50% of which was counseling/coordinating care for lower extremity venous insufficiency.

## 2023-01-09 ENCOUNTER — Ambulatory Visit: Payer: Medicare Other | Admitting: *Deleted

## 2023-01-09 DIAGNOSIS — M5459 Other low back pain: Secondary | ICD-10-CM | POA: Diagnosis not present

## 2023-01-09 DIAGNOSIS — M62830 Muscle spasm of back: Secondary | ICD-10-CM

## 2023-01-09 DIAGNOSIS — M25561 Pain in right knee: Secondary | ICD-10-CM | POA: Diagnosis not present

## 2023-01-09 NOTE — Therapy (Signed)
OUTPATIENT PHYSICAL THERAPY THORACOLUMBAR TREATMENT   Patient Name: Jordan [name] MRN: [id number] DOB:Jul 08, 1945, 77 y.o., male Today's Date: 01/09/2023  END OF SESSION:  PT End of Session - 01/09/23 0855     Visit Number 5    Number of Visits 8    Date for PT Re-Evaluation 01/24/23    Authorization Type FOTO.    PT Start Time 0853    PT Stop Time 0940    PT Time Calculation (min) 47 min              Past Medical History:  Diagnosis Date   Anxiety    Mild   Atrial fibrillation (HCC)    Paroxysmal   BPH (benign prostatic hyperplasia)    CAD (coronary artery disease)    Stent LAD, 2002  /  nuclear 2009, no ischemia  /    Hospital October, 2011 nuclear, no ischemia, possible slight apical scar, ejection fraction 70%   Cancer (HCC)    prostate CA-non aggressive   Carotid bruit    Doppler November, 2011, normal   Chest pain 02/23/2013   Chronic anticoagulation    Low CHADS , score, but patient wants to be aggressive   Colon polyps    COVID-19    + long Covid problems   Drug intolerance    Mild beta blocker intolerance   Dyslipidemia    Ejection fraction    EF 65-70%, echo, 2007 /  EF 70%, nuclear, 2011   Hyperlipidemia    IBS (irritable bowel syndrome)    Incomplete RBBB    Knee pain    left   Personal history of colonic adenomas 11/29/2006   Qualifier: Diagnosis of  By: Genelle Gather CMA, [place]     Sleep apnea    Syncope    November, 2010   Past Surgical History:  Procedure Laterality Date   ATRIAL FIBRILLATION ABLATION N/A 10/25/2021   Procedure: ATRIAL FIBRILLATION ABLATION;  Surgeon: Regan Lemming, MD;  Location: [facility];  Service: Cardiovascular;  Laterality: N/A;   BACK SURGERY  05/1990   [id number]   CARDIAC CATHETERIZATION     COLONOSCOPY  multiple   LUMBAR LAMINECTOMY/ DECOMPRESSION WITH MET-RX Left 09/13/2022   Procedure: Minimally Invasive  Laminectomy, Medical Facetectomy ,Microdiscectomy Left Lumbar Four-Five;  Surgeon: Bedelia Person, MD;  Location: [facility] OR;  Service: Neurosurgery;  Laterality: Left;  3C   PROSTATE SURGERY     UMBILICAL HERNIA REPAIR  07/15/2021   [facility]   XI ROBOTIC ASSISTED SIMPLE PROSTATECTOMY  2022   Patient Active Problem List   Diagnosis Date Noted   Back pain of lumbosacral region with sciatica 10/16/2022   Other spondylosis with radiculopathy, lumbar region 09/13/2022   History of meniscal tear 10/18/2021   Hypercoagulable state due to paroxysmal atrial fibrillation (HCC) 07/12/2021   OSA on CPAP 04/23/2020   Abnormal dreams 01/21/2020   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HCC) 12/22/2019   Long term (current) use of anticoagulants 06/24/2015   Coronary artery disease involving native coronary artery of native heart without angina pectoris 06/24/2015   Facet degeneration of lumbar region 08/26/2012   Lumbar radiculopathy 08/26/2012   Osteoarthritis of right knee 03/26/2011   Traumatic tear of lateral meniscus of right knee 03/26/2011   Ejection fraction    Atrial fibrillation (HCC)    Drug intolerance    Incomplete RBBB    Carotid bruit    Knee pain    Anxiety    hyperlipidemia    Palpitations  Benign prostatic hyperplasia 02/06/2009   Personal history of colonic adenomas 11/29/2006    REFERRING PROVIDER: Hoyt Koch MD  REFERRING DIAG: Lumbar back pain.  S/p left lumbar microdiskectomy L4-5.  Rationale for Evaluation and Treatment: Rehabilitation  THERAPY DIAG:  Other low back pain  Muscle spasm of back  ONSET DATE: Ongoing, surgery (09/13/2022).  SUBJECTIVE:                                                                                                                                                                                           SUBJECTIVE STATEMENT: Patient reports that he feels good today.   PERTINENT HISTORY:  Prior lumbar surgery (1991), h/o bilateral knee pain.  PAIN:  Are you having pain? Yes: NPRS scale: 2/10 Pain location: Left low  back. Pain description: Sore Aggravating factors: As above. Relieving factors: As above.  PRECAUTIONS: Other: Lumbar diskectomy protocol.   WEIGHT BEARING RESTRICTIONS: No  FALLS:  Has patient fallen in last 6 months? No  LIVING ENVIRONMENT: Lives with: lives with their spouse Lives in: House/apartment Has following equipment at home: None  PLOF: Independent with basic ADLs  PATIENT GOALS: Play Pickleball.   OBJECTIVE:   POSTURE: rounded shoulders, forward head, and decreased lumbar lordosis  PALPATION: Tender to palpation over left lower lumbar musculature.  Incision appears to be healing well.  LOWER EXTREMITY ROM:     In supine:  Left hip flexion is 100 degrees and right is 105 degrees.  LOWER EXTREMITY MMT:    Normal bilateral knee and ankle strength.   GAIT: The patient walks in some trunk flexion.  TODAY'S TREATMENT:                                                                                                                              DATE:  01/09/23 EXERCISE LOG    SX 09-13-22  Exercise Repetitions and Resistance Comments  Nustep L4 x 16 minutes   XTS Lat pulldown 2x10 hold 5 secs Blue   XTS ROW 2x10 Blue   Bridge  20 reps     SLR     Supine clams      dying bug  Opp. Arm/leg raise x 6 each hold for 10 secs Verbal and tactile cues for technique  Wall squat     Slouch overcorrect     Blank cell = exercise not performed today  Discussed postures and Movement patterns for ADL's                                    01-02-23 EXERCISE LOG  Exercise Repetitions and Resistance Comments  Nustep X 15 mins    AB bracing X10 hold secs   Hip flexor stretch X 3 hold 30 secs with glute squeeze   Standing mod. Bird-dog X6 hold hold 5 secs each arm, x6 hold 5 secs each LE   Dying bug  X6 hold 5-10 secs each arm/ opp leg   Cat/ camel x10    Blank cell =  exercise not performed today  Reviewed  HEP and Discussed AB bracing with transitional movements and movement patterns to decrease pain triggers with ADL's  PATIENT EDUCATION:   HOME EXERCISE PROGRAM:   ASSESSMENT:  CLINICAL IMPRESSION:Pt arrived today still doing fairly well with only 2/10 pain LB. Rx focused on core activation and strengthening exs as well as discussion of ADL's and movement patterns. He reports no increase in pain or discomfort with any of today's interventions. He reported feeling good upon the conclusion of treatment. He continues to require skilled physical therapy to address his remaining impairments to return to his prior level of function.   OBJECTIVE IMPAIRMENTS: decreased activity tolerance, decreased ROM, increased muscle spasms, postural dysfunction, and pain.   ACTIVITY LIMITATIONS: carrying, lifting, and bending  PARTICIPATION LIMITATIONS: cleaning  REHAB POTENTIAL: Excellent  CLINICAL DECISION MAKING: Stable/uncomplicated  EVALUATION COMPLEXITY: Low   GOALS:  SHORT TERM GOALS: Target date: 01/24/23  Ind with a HEP. Goal status: INITIAL  2.  Perform ADL's with pain not > 2-3/10.  Goal status: INITIAL  3.  Return to recreational activities.  Goal status: INITIAL Baseline:  Goal status: INITIAL   PLAN:  PT FREQUENCY: 2x/week  PT DURATION: 4 weeks  PLANNED INTERVENTIONS: Therapeutic exercises, Therapeutic activity, Neuromuscular re-education, Patient/Family education, Self Care, Electrical stimulation, Cryotherapy, Moist heat, Ultrasound, and Manual therapy.  PLAN FOR NEXT SESSION: STW/M, Core exercise progression, spinal protection techniques and body mechanics training.   Deylan Canterbury,CHRIS, PTA 01/09/2023, 10:52 AM  [facility] at [facility] [address] [place], [place], [address] Phone: [phone]   Fax:  [phone]

## 2023-01-10 ENCOUNTER — Other Ambulatory Visit: Payer: Self-pay | Admitting: Interventional Radiology

## 2023-01-10 ENCOUNTER — Telehealth: Payer: Self-pay

## 2023-01-10 DIAGNOSIS — I872 Venous insufficiency (chronic) (peripheral): Secondary | ICD-10-CM

## 2023-01-10 NOTE — Telephone Encounter (Signed)
Valium 5mg  Qty: 2 tablets Refills: 0 Instructions: Take tablets to Procedure, per Dr. Elby Showers. Called into [facility] and [facility]. Pt aware.

## 2023-01-11 ENCOUNTER — Encounter: Payer: Self-pay | Admitting: *Deleted

## 2023-01-11 ENCOUNTER — Ambulatory Visit: Payer: Medicare Other | Attending: Neurosurgery | Admitting: *Deleted

## 2023-01-11 DIAGNOSIS — M62830 Muscle spasm of back: Secondary | ICD-10-CM | POA: Insufficient documentation

## 2023-01-11 DIAGNOSIS — M5459 Other low back pain: Secondary | ICD-10-CM | POA: Diagnosis not present

## 2023-01-11 NOTE — Therapy (Signed)
OUTPATIENT PHYSICAL THERAPY THORACOLUMBAR TREATMENT   Patient Name: Jordan [name] MRN: [id number] DOB:07-Aug-1945, 77 y.o., male Today's Date: 01/11/2023  END OF SESSION:  PT End of Session - 01/11/23 0904     Visit Number 6    Number of Visits 8    Date for PT Re-Evaluation 01/24/23    Authorization Type FOTO.    PT Start Time 0853    PT Stop Time 0939    PT Time Calculation (min) 46 min              Past Medical History:  Diagnosis Date   Anxiety    Mild   Atrial fibrillation (HCC)    Paroxysmal   BPH (benign prostatic hyperplasia)    CAD (coronary artery disease)    Stent LAD, 2002  /  nuclear 2009, no ischemia  /    Hospital October, 2011 nuclear, no ischemia, possible slight apical scar, ejection fraction 70%   Cancer (HCC)    prostate CA-non aggressive   Carotid bruit    Doppler November, 2011, normal   Chest pain 02/23/2013   Chronic anticoagulation    Low CHADS , score, but patient wants to be aggressive   Colon polyps    COVID-19    + long Covid problems   Drug intolerance    Mild beta blocker intolerance   Dyslipidemia    Ejection fraction    EF 65-70%, echo, 2007 /  EF 70%, nuclear, 2011   Hyperlipidemia    IBS (irritable bowel syndrome)    Incomplete RBBB    Knee pain    left   Personal history of colonic adenomas 11/29/2006   Qualifier: Diagnosis of  By: Genelle Gather CMA, [place]     Sleep apnea    Syncope    November, 2010   Past Surgical History:  Procedure Laterality Date   ATRIAL FIBRILLATION ABLATION N/A 10/25/2021   Procedure: ATRIAL FIBRILLATION ABLATION;  Surgeon: Regan Lemming, MD;  Location: [facility];  Service: Cardiovascular;  Laterality: N/A;   BACK SURGERY  05/1990   L4L5S1   CARDIAC CATHETERIZATION     COLONOSCOPY  multiple   LUMBAR LAMINECTOMY/ DECOMPRESSION WITH MET-RX Left 09/13/2022   Procedure: Minimally Invasive  Laminectomy, Medical Facetectomy ,Microdiscectomy Left Lumbar Four-Five;  Surgeon: Bedelia Person, MD;  Location: [facility] OR;  Service: Neurosurgery;  Laterality: Left;  3C   PROSTATE SURGERY     UMBILICAL HERNIA REPAIR  07/15/2021   [facility]   XI ROBOTIC ASSISTED SIMPLE PROSTATECTOMY  2022   Patient Active Problem List   Diagnosis Date Noted   Back pain of lumbosacral region with sciatica 10/16/2022   Other spondylosis with radiculopathy, lumbar region 09/13/2022   History of meniscal tear 10/18/2021   Hypercoagulable state due to paroxysmal atrial fibrillation (HCC) 07/12/2021   OSA on CPAP 04/23/2020   Abnormal dreams 01/21/2020   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HCC) 12/22/2019   Long term (current) use of anticoagulants 06/24/2015   Coronary artery disease involving native coronary artery of native heart without angina pectoris 06/24/2015   Facet degeneration of lumbar region 08/26/2012   Lumbar radiculopathy 08/26/2012   Osteoarthritis of right knee 03/26/2011   Traumatic tear of lateral meniscus of right knee 03/26/2011   Ejection fraction    Atrial fibrillation (HCC)    Drug intolerance    Incomplete RBBB    Carotid bruit    Knee pain    Anxiety    hyperlipidemia    Palpitations  Benign prostatic hyperplasia 02/06/2009   Personal history of colonic adenomas 11/29/2006    REFERRING PROVIDER: Hoyt Koch MD  REFERRING DIAG: Lumbar back pain.  S/p left lumbar microdiskectomy L4-5.  Rationale for Evaluation and Treatment: Rehabilitation  THERAPY DIAG:  Other low back pain  Muscle spasm of back  ONSET DATE: Ongoing, surgery (09/13/2022).  SUBJECTIVE:                                                                                                                                                                                           SUBJECTIVE STATEMENT: Patient reports that he feels good today. Ready to return to pickle ball.  PERTINENT HISTORY:  Prior lumbar surgery (1991), h/o bilateral knee pain.  PAIN:  Are you having pain? Yes: NPRS scale:  2/10 Pain location: Left low back. Pain description: Sore Aggravating factors: As above. Relieving factors: As above.  PRECAUTIONS: Other: Lumbar diskectomy protocol.   WEIGHT BEARING RESTRICTIONS: No  FALLS:  Has patient fallen in last 6 months? No  LIVING ENVIRONMENT: Lives with: lives with their spouse Lives in: House/apartment Has following equipment at home: None  PLOF: Independent with basic ADLs  PATIENT GOALS: Play Pickleball.   OBJECTIVE:   POSTURE: rounded shoulders, forward head, and decreased lumbar lordosis  PALPATION: Tender to palpation over left lower lumbar musculature.  Incision appears to be healing well.  LOWER EXTREMITY ROM:     In supine:  Left hip flexion is 100 degrees and right is 105 degrees.  LOWER EXTREMITY MMT:    Normal bilateral knee and ankle strength.   GAIT: The patient walks in some trunk flexion.  TODAY'S TREATMENT:                                                                                                                              DATE:  01/11/23 EXERCISE LOG    SX 09-13-22  Exercise Repetitions and Resistance Comments  Nustep L4 x 16 minutes   XTS Lat pulldown 2x10 hold 5 secs Blue   XTS ROW 2x10 Blue   Bridge  20 reps     SLR     Supine clams     Standing Anti-rotation  Blue Tband x 10 each side hold 5 secs    dying bug  Opp. Arm/leg raise x 6 each hold for 10 secs Verbal and tactile cues for technique  Standing Bird dog    Wall squat     Slouch overcorrect     Blank cell = exercise not performed today  Discussed postures and Movement patterns for ADL's                                    01-02-23 EXERCISE LOG  Exercise Repetitions and Resistance Comments  Nustep X 15 mins    AB bracing X10 hold secs   Hip flexor stretch X 3 hold 30 secs with glute squeeze   Standing mod. Bird-dog X6 hold hold 5 secs each  arm, x6 hold 5 secs each LE   Dying bug  X6 hold 5-10 secs each arm/ opp leg   Cat/ camel x10    Blank cell = exercise not performed today  Reviewed  HEP and Discussed AB bracing with transitional movements and movement patterns to decrease pain triggers with ADL's  PATIENT EDUCATION:   HOME EXERCISE PROGRAM:   ASSESSMENT:  CLINICAL IMPRESSION:Pt arrived today doing well with LB and reports wanting to return to pickle ball. Rx focused again on core activation and strengthening exs with new act.'s performed and tolerated well.  He reports no increase in pain or discomfort with any of today's interventions. He reported feeling good upon the conclusion of treatment. He continues to require skilled physical therapy to address his remaining impairments to return to his prior level of function. LTG # 1 and 2 Met today  OBJECTIVE IMPAIRMENTS: decreased activity tolerance, decreased ROM, increased muscle spasms, postural dysfunction, and pain.   ACTIVITY LIMITATIONS: carrying, lifting, and bending  PARTICIPATION LIMITATIONS: cleaning  REHAB POTENTIAL: Excellent  CLINICAL DECISION MAKING: Stable/uncomplicated  EVALUATION COMPLEXITY: Low   GOALS:  SHORT TERM GOALS: Target date: 01/24/23  Ind with a HEP. Goal status: MET  2.  Perform ADL's with pain not > 2-3/10.  Goal status: MET  3.  Return to recreational activities.  Goal status: Baseline:  Goal status: INITIAL   PLAN:  PT FREQUENCY: 2x/week  PT DURATION: 4 weeks  PLANNED INTERVENTIONS: Therapeutic exercises, Therapeutic activity, Neuromuscular re-education, Patient/Family education, Self Care, Electrical stimulation, Cryotherapy, Moist heat, Ultrasound, and Manual therapy.  PLAN FOR NEXT SESSION: STW/M, Core exercise progression, spinal protection techniques and body mechanics training.   Leana Springston,CHRIS, PTA 01/11/2023, 9:39 AM  [facility] Out[facility] Rehabilitation at [facility] [address] [place], [place],  [address] Phone: [phone]   Fax:  [phone]

## 2023-01-15 ENCOUNTER — Ambulatory Visit: Payer: Medicare Other

## 2023-01-15 DIAGNOSIS — M62830 Muscle spasm of back: Secondary | ICD-10-CM | POA: Diagnosis not present

## 2023-01-15 DIAGNOSIS — M5459 Other low back pain: Secondary | ICD-10-CM | POA: Diagnosis not present

## 2023-01-15 NOTE — Therapy (Signed)
OUTPATIENT PHYSICAL THERAPY THORACOLUMBAR TREATMENT   Patient Name: Jordan [name] MRN: [id number] DOB:06-18-1945, 77 y.o., male Today's Date: 01/15/2023  END OF SESSION:  PT End of Session - 01/15/23 1027     Visit Number 7    Number of Visits 8    Date for PT Re-Evaluation 01/24/23    Authorization Type FOTO.    PT Start Time 1015    PT Stop Time 1100    PT Time Calculation (min) 45 min    Activity Tolerance Patient tolerated treatment well    Behavior During Therapy [facility] for tasks assessed/performed              Past Medical History:  Diagnosis Date   Anxiety    Mild   Atrial fibrillation (HCC)    Paroxysmal   BPH (benign prostatic hyperplasia)    CAD (coronary artery disease)    Stent LAD, 2002  /  nuclear 2009, no ischemia  /    Hospital October, 2011 nuclear, no ischemia, possible slight apical scar, ejection fraction 70%   Cancer (HCC)    prostate CA-non aggressive   Carotid bruit    Doppler November, 2011, normal   Chest pain 02/23/2013   Chronic anticoagulation    Low CHADS , score, but patient wants to be aggressive   Colon polyps    COVID-19    + long Covid problems   Drug intolerance    Mild beta blocker intolerance   Dyslipidemia    Ejection fraction    EF 65-70%, echo, 2007 /  EF 70%, nuclear, 2011   Hyperlipidemia    IBS (irritable bowel syndrome)    Incomplete RBBB    Knee pain    left   Personal history of colonic adenomas 11/29/2006   Qualifier: Diagnosis of  By: Genelle Gather CMA, [place]     Sleep apnea    Syncope    November, 2010   Past Surgical History:  Procedure Laterality Date   ATRIAL FIBRILLATION ABLATION N/A 10/25/2021   Procedure: ATRIAL FIBRILLATION ABLATION;  Surgeon: Regan Lemming, MD;  Location: [facility];  Service: Cardiovascular;  Laterality: N/A;   BACK SURGERY  05/1990   L4L5S1   CARDIAC CATHETERIZATION     COLONOSCOPY  multiple   LUMBAR LAMINECTOMY/ DECOMPRESSION WITH MET-RX Left 09/13/2022   Procedure:  Minimally Invasive  Laminectomy, Medical Facetectomy ,Microdiscectomy Left Lumbar Four-Five;  Surgeon: Bedelia Person, MD;  Location: [facility] OR;  Service: Neurosurgery;  Laterality: Left;  3C   PROSTATE SURGERY     UMBILICAL HERNIA REPAIR  07/15/2021   [facility]   XI ROBOTIC ASSISTED SIMPLE PROSTATECTOMY  2022   Patient Active Problem List   Diagnosis Date Noted   Back pain of lumbosacral region with sciatica 10/16/2022   Other spondylosis with radiculopathy, lumbar region 09/13/2022   History of meniscal tear 10/18/2021   Hypercoagulable state due to paroxysmal atrial fibrillation (HCC) 07/12/2021   OSA on CPAP 04/23/2020   Abnormal dreams 01/21/2020   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HCC) 12/22/2019   Long term (current) use of anticoagulants 06/24/2015   Coronary artery disease involving native coronary artery of native heart without angina pectoris 06/24/2015   Facet degeneration of lumbar region 08/26/2012   Lumbar radiculopathy 08/26/2012   Osteoarthritis of right knee 03/26/2011   Traumatic tear of lateral meniscus of right knee 03/26/2011   Ejection fraction    Atrial fibrillation (HCC)    Drug intolerance    Incomplete RBBB  Carotid bruit    Knee pain    Anxiety    hyperlipidemia    Palpitations    Benign prostatic hyperplasia 02/06/2009   Personal history of colonic adenomas 11/29/2006    REFERRING PROVIDER: Hoyt Koch MD  REFERRING DIAG: Lumbar back pain.  S/p left lumbar microdiskectomy L4-5.  Rationale for Evaluation and Treatment: Rehabilitation  THERAPY DIAG:  Other low back pain  Muscle spasm of back  ONSET DATE: Ongoing, surgery (09/13/2022).  SUBJECTIVE:                                                                                                                                                                                           SUBJECTIVE STATEMENT: Pt reports very minimal pain today.  Ready to discharge at next session.  PERTINENT HISTORY:   Prior lumbar surgery (1991), h/o bilateral knee pain.  PAIN:  Are you having pain? Yes: NPRS scale: 2/10 Pain location: Left low back. Pain description: Sore Aggravating factors: As above. Relieving factors: As above.  PRECAUTIONS: Other: Lumbar diskectomy protocol.   WEIGHT BEARING RESTRICTIONS: No  FALLS:  Has patient fallen in last 6 months? No  LIVING ENVIRONMENT: Lives with: lives with their spouse Lives in: House/apartment Has following equipment at home: None  PLOF: Independent with basic ADLs  PATIENT GOALS: Play Pickleball.   OBJECTIVE:   POSTURE: rounded shoulders, forward head, and decreased lumbar lordosis  PALPATION: Tender to palpation over left lower lumbar musculature.  Incision appears to be healing well.  LOWER EXTREMITY ROM:     In supine:  Left hip flexion is 100 degrees and right is 105 degrees.  LOWER EXTREMITY MMT:    Normal bilateral knee and ankle strength.   GAIT: The patient walks in some trunk flexion.  TODAY'S TREATMENT:                                                                                                                              DATE:  01/15/23 EXERCISE LOG     Exercise Repetitions and Resistance Comments  Nustep L4 x 16 minutes   XTS Lat pulldown 2x15 hold 5 secs Blue   XTS ROW 2x15 Blue   Cybex Knee Flexion 30# x4 mins   Cybex Knee Extension 20# x4 mins   Bridge  X25 reps   SLR  X25 reps bil   Supine clams     Standing Anti-rotation     Dying bug   Verbal and tactile cues for technique  Standing Bird dog    Wall squat     Slouch overcorrect     Blank cell = exercise not performed today  Discussed postures and Movement patterns for ADL's  PATIENT EDUCATION:   HOME EXERCISE PROGRAM:   ASSESSMENT:  CLINICAL IMPRESSION:  Pt arrives for today's treatment session reporting very minimal pain.  Pt  reports feeling much better since beginning therapy and is ready to return to playing pickle ball.  Pt able to tolerate increased reps with all previously performed exercises without complaint or discomfort.  Pt introduced to cybex knee flexion and extension with min cues for eccentric control required.  Pt ready for discharge at next session.  OBJECTIVE IMPAIRMENTS: decreased activity tolerance, decreased ROM, increased muscle spasms, postural dysfunction, and pain.   ACTIVITY LIMITATIONS: carrying, lifting, and bending  PARTICIPATION LIMITATIONS: cleaning  REHAB POTENTIAL: Excellent  CLINICAL DECISION MAKING: Stable/uncomplicated  EVALUATION COMPLEXITY: Low   GOALS:  SHORT TERM GOALS: Target date: 01/24/23  Ind with a HEP. Goal status: MET  2.  Perform ADL's with pain not > 2-3/10.  Goal status: MET  3.  Return to recreational activities.  Goal status: Baseline:  Goal status: INITIAL   PLAN:  PT FREQUENCY: 2x/week  PT DURATION: 4 weeks  PLANNED INTERVENTIONS: Therapeutic exercises, Therapeutic activity, Neuromuscular re-education, Patient/Family education, Self Care, Electrical stimulation, Cryotherapy, Moist heat, Ultrasound, and Manual therapy.  PLAN FOR NEXT SESSION: STW/M, Core exercise progression, spinal protection techniques and body mechanics training.   Newman Pies, PTA 01/15/2023, 11:12 AM  [facility] at [facility] [address] [place], [place], [address] Phone: [phone]   Fax:  [phone]

## 2023-01-17 ENCOUNTER — Ambulatory Visit: Payer: Medicare Other | Admitting: Physical Therapy

## 2023-01-17 ENCOUNTER — Encounter: Payer: Self-pay | Admitting: Physical Therapy

## 2023-01-17 DIAGNOSIS — M62830 Muscle spasm of back: Secondary | ICD-10-CM | POA: Diagnosis not present

## 2023-01-17 DIAGNOSIS — M5459 Other low back pain: Secondary | ICD-10-CM

## 2023-01-17 NOTE — Therapy (Addendum)
OUTPATIENT PHYSICAL THERAPY THORACOLUMBAR TREATMENT   Patient Name: Jordan [name] MRN: [id number] DOB:1946/05/16, 77 y.o., male Today's Date: 01/17/2023  END OF SESSION:  PT End of Session - 01/17/23 1033     Visit Number 8    Number of Visits 8    Date for PT Re-Evaluation 01/24/23    Authorization Type FOTO.    PT Start Time 1019    PT Stop Time 1100    PT Time Calculation (min) 41 min    Activity Tolerance Patient tolerated treatment well    Behavior During Therapy [facility] for tasks assessed/performed              Past Medical History:  Diagnosis Date   Anxiety    Mild   Atrial fibrillation (HCC)    Paroxysmal   BPH (benign prostatic hyperplasia)    CAD (coronary artery disease)    Stent LAD, 2002  /  nuclear 2009, no ischemia  /    Hospital October, 2011 nuclear, no ischemia, possible slight apical scar, ejection fraction 70%   Cancer (HCC)    prostate CA-non aggressive   Carotid bruit    Doppler November, 2011, normal   Chest pain 02/23/2013   Chronic anticoagulation    Low CHADS , score, but patient wants to be aggressive   Colon polyps    COVID-19    + long Covid problems   Drug intolerance    Mild beta blocker intolerance   Dyslipidemia    Ejection fraction    EF 65-70%, echo, 2007 /  EF 70%, nuclear, 2011   Hyperlipidemia    IBS (irritable bowel syndrome)    Incomplete RBBB    Knee pain    left   Personal history of colonic adenomas 11/29/2006   Qualifier: Diagnosis of  By: Genelle Gather CMA, [place]     Sleep apnea    Syncope    November, 2010   Past Surgical History:  Procedure Laterality Date   ATRIAL FIBRILLATION ABLATION N/A 10/25/2021   Procedure: ATRIAL FIBRILLATION ABLATION;  Surgeon: Regan Lemming, MD;  [facility]: [facility];  Service: Cardiovascular;  Laterality: N/A;   BACK SURGERY  05/1990   L4L5S1   CARDIAC CATHETERIZATION     COLONOSCOPY  multiple   LUMBAR LAMINECTOMY/ DECOMPRESSION WITH MET-RX Left 09/13/2022   Procedure:  Minimally Invasive  Laminectomy, Medical Facetectomy ,Microdiscectomy Left Lumbar Four-Five;  Surgeon: Bedelia Person, MD;  Location: [facility];  Service: Neurosurgery;  Laterality: Left;  3C   PROSTATE SURGERY     UMBILICAL HERNIA REPAIR  07/15/2021   [facility]   XI ROBOTIC ASSISTED SIMPLE PROSTATECTOMY  2022   Patient Active Problem List   Diagnosis Date Noted   Back pain of lumbosacral region with sciatica 10/16/2022   Other spondylosis with radiculopathy, lumbar region 09/13/2022   History of meniscal tear 10/18/2021   Hypercoagulable state due to paroxysmal atrial fibrillation (HCC) 07/12/2021   OSA on CPAP 04/23/2020   Abnormal dreams 01/21/2020   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HCC) 12/22/2019   Long term (current) use of anticoagulants 06/24/2015   Coronary artery disease involving native coronary artery of native heart without angina pectoris 06/24/2015   Facet degeneration of lumbar region 08/26/2012   Lumbar radiculopathy 08/26/2012   Osteoarthritis of right knee 03/26/2011   Traumatic tear of lateral meniscus of right knee 03/26/2011   Ejection fraction    Atrial fibrillation (HCC)    Drug intolerance    Incomplete RBBB  Carotid bruit    Knee pain    Anxiety    hyperlipidemia    Palpitations    Benign prostatic hyperplasia 02/06/2009   Personal history of colonic adenomas 11/29/2006    REFERRING PROVIDER: Hoyt Koch MD  REFERRING DIAG: Lumbar back pain.  S/p left lumbar microdiskectomy L4-5.  Rationale for Evaluation and Treatment: Rehabilitation  THERAPY DIAG:  Other low back pain  Muscle spasm of back  ONSET DATE: Ongoing, surgery (09/13/2022).  SUBJECTIVE:                                                                                                                                                                                           SUBJECTIVE STATEMENT: Reports that his back feels good other than normal stiffness.  PERTINENT HISTORY:  Prior  lumbar surgery (1991), h/o bilateral knee pain.  PAIN:  Are you having pain? Only normal stiffness  PRECAUTIONS: Other: Lumbar diskectomy protocol.   WEIGHT BEARING RESTRICTIONS: No  FALLS:  Has patient fallen in last 6 months? No  LIVING ENVIRONMENT: Lives with: lives with their spouse Lives in: House/apartment Has following equipment at home: None  PLOF: Independent with basic ADLs  PATIENT GOALS: Play Pickleball.  OBJECTIVE:   FOTO: 84  POSTURE: rounded shoulders, forward head, and decreased lumbar lordosis  PALPATION: Tender to palpation over left lower lumbar musculature.  Incision appears to be healing well.  LOWER EXTREMITY ROM:    In supine:  Left hip flexion is 100 degrees and right is 105 degrees.  LOWER EXTREMITY MMT:   Normal bilateral knee and ankle strength.  GAIT: The patient walks in some trunk flexion.  TODAY'S TREATMENT:                                                                                                                              DATE:   01/17/23 EXERCISE LOG     Exercise Repetitions and Resistance Comments  Nustep L4 x 15 minutes   XTS Lat pulldown 2x15 hold 5 secs Blue   XTS ROW 2x15 Blue   Cybex Knee Flexion  30# x30 reps   Cybex Knee Extension 10# x30 reps   Bridge  X25 reps   Standing hip extension X20 reps   Mod plank arm raise X15 reps    Blank cell = exercise not performed today   PATIENT EDUCATION:   HOME EXERCISE PROGRAM:  ASSESSMENT:  CLINICAL IMPRESSION:  Patient presented in clinic with reports of no LBP currently other than normal stiffness. Patient interested in returning to pickleball. Patient progressed through strengthening LE and core/lumbar strengthening. Patient had no complaints throughout treatment. Able to achieve all goals as patient feels capable and interested in returning to recreational activities.  OBJECTIVE IMPAIRMENTS: decreased activity tolerance, decreased ROM, increased muscle spasms,  postural dysfunction, and pain.   ACTIVITY LIMITATIONS: carrying, lifting, and bending  PARTICIPATION LIMITATIONS: cleaning  REHAB POTENTIAL: Excellent  CLINICAL DECISION MAKING: Stable/uncomplicated  EVALUATION COMPLEXITY: Low   GOALS:  SHORT TERM GOALS: Target date: 01/24/23  Ind with a HEP. Goal status: MET  2.  Perform ADL's with pain not > 2-3/10.  Goal status: MET  3.  Return to recreational activities.  Goal status: Baseline:  Goal status: MET/ interested in returning   PLAN:  PT FREQUENCY: 2x/week  PT DURATION: 4 weeks  PLANNED INTERVENTIONS: Therapeutic exercises, Therapeutic activity, Neuromuscular re-education, Patient/Family education, Self Care, Electrical stimulation, Cryotherapy, Moist heat, Ultrasound, and Manual therapy.  PLAN FOR NEXT SESSION: DC   Marvell Fuller, PTA 01/17/2023, 11:34 AM  [facility] [address] [place], [place], [address] Phone: [phone]   Fax:  [phone]  PHYSICAL THERAPY DISCHARGE SUMMARY  Visits from Start of Care: 8.  Current functional level related to goals / functional outcomes: See above.   Remaining deficits: Goals met.   Education / Equipment: HEP.   Patient agrees to discharge. Patient goals were met. Patient is being discharged due to being pleased with the current functional level.    [place] App[place] MPT

## 2023-01-25 ENCOUNTER — Ambulatory Visit (HOSPITAL_COMMUNITY): Payer: Medicare Other | Admitting: Physician Assistant

## 2023-02-07 ENCOUNTER — Other Ambulatory Visit: Payer: Medicare Other

## 2023-02-07 ENCOUNTER — Ambulatory Visit: Payer: Medicare Other

## 2023-02-08 ENCOUNTER — Ambulatory Visit
Admission: RE | Admit: 2023-02-08 | Discharge: 2023-02-08 | Disposition: A | Discharge status: Home / Self Care | Payer: Medicare Other | Source: Ambulatory Visit | Attending: Interventional Radiology | Admitting: Interventional Radiology

## 2023-02-08 DIAGNOSIS — I872 Venous insufficiency (chronic) (peripheral): Secondary | ICD-10-CM

## 2023-02-08 DIAGNOSIS — M7122 Synovial cyst of popliteal space [Baker], left knee: Secondary | ICD-10-CM | POA: Diagnosis not present

## 2023-02-08 DIAGNOSIS — M7121 Synovial cyst of popliteal space [Baker], right knee: Secondary | ICD-10-CM | POA: Diagnosis not present

## 2023-02-08 HISTORY — PX: IR EMBO VENOUS NOT HEMORR HEMANG  INC GUIDE ROADMAPPING: IMG5447

## 2023-02-13 ENCOUNTER — Other Ambulatory Visit: Payer: Self-pay | Admitting: Interventional Radiology

## 2023-02-13 DIAGNOSIS — I872 Venous insufficiency (chronic) (peripheral): Secondary | ICD-10-CM

## 2023-02-16 ENCOUNTER — Ambulatory Visit
Admission: RE | Admit: 2023-02-16 | Discharge: 2023-02-16 | Disposition: A | Discharge status: Home / Self Care | Payer: Medicare Other | Source: Ambulatory Visit | Attending: Interventional Radiology | Admitting: Interventional Radiology

## 2023-02-16 ENCOUNTER — Ambulatory Visit
Admission: RE | Admit: 2023-02-16 | Discharge: 2023-02-16 | Disposition: A | Discharge status: Home / Self Care | Payer: Medicare Other | Source: Ambulatory Visit | Attending: Interventional Radiology

## 2023-02-16 DIAGNOSIS — I872 Venous insufficiency (chronic) (peripheral): Secondary | ICD-10-CM

## 2023-02-16 DIAGNOSIS — I82812 Embolism and thrombosis of superficial veins of left lower extremities: Secondary | ICD-10-CM | POA: Diagnosis not present

## 2023-02-16 DIAGNOSIS — M7121 Synovial cyst of popliteal space [Baker], right knee: Secondary | ICD-10-CM | POA: Diagnosis not present

## 2023-02-16 DIAGNOSIS — M7122 Synovial cyst of popliteal space [Baker], left knee: Secondary | ICD-10-CM | POA: Diagnosis not present

## 2023-02-16 HISTORY — PX: IR RADIOLOGIST EVAL & MGMT: IMG5224

## 2023-02-16 NOTE — Progress Notes (Signed)
Reason for follow up: 1 week follow up after left GSV EVLT and bilateral Baker cyst aspirations   Referring Physician(s): Gottschalk,Ashly M  History of present illness: 77 year old male with bilateral, left greater than right, lower extremity superficial venous reflux (C4EpAs/pPr).  He has used compression stockings for years without resolution of symptoms.  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  Bilateral pelvic draining veins are visualized sonographically, however on CTV there are no significant pelvic collaterals or evidence of pelvic venous disorder.    He underwent bilateral Baker cyst aspirations and left GSV EVLT (from mid calf) on 02/08/23. He has been compliant with left lower extremity compression stockings for the past week.  He is feeling great. He states that his knees feel like he is 19 again, and he had significantly less stiffness.  Overall he has less heaviness and aching in the left leg.  He does have a focal area on the medial thigh that has been tender, but improving.  He has been walking without difficulty and looking forward to starting to play pickleball again.    Past Medical History:  Diagnosis Date   Anxiety    Mild   Atrial fibrillation (HCC)    Paroxysmal   BPH (benign prostatic hyperplasia)    CAD (coronary artery disease)    Stent LAD, 2002  /  nuclear 2009, no ischemia  /    Hospital October, 2011 nuclear, no ischemia, possible slight apical scar, ejection fraction 70%   Cancer (HCC)    prostate CA-non aggressive   Carotid bruit    Doppler November, 2011, normal   Chest pain 02/23/2013   Chronic anticoagulation    Low CHADS , score, but patient wants to be aggressive   Colon polyps    COVID-19    + long Covid problems   Drug intolerance    Mild beta blocker intolerance   Dyslipidemia    Ejection fraction    EF 65-70%, echo, 2007 /  EF 70%, nuclear, 2011   Hyperlipidemia    IBS (irritable bowel syndrome)    Incomplete  RBBB    Knee pain    left   Personal history of colonic adenomas 11/29/2006   Qualifier: Diagnosis of  By: Genelle Gather CMA, [place]     Sleep apnea    Syncope    November, 2010    Past Surgical History:  Procedure Laterality Date   ATRIAL FIBRILLATION ABLATION N/A 10/25/2021   Procedure: ATRIAL FIBRILLATION ABLATION;  Surgeon: Regan Lemming, MD;  Location: [facility];  Service: Cardiovascular;  Laterality: N/A;   BACK SURGERY  05/1990   L4L5S1   CARDIAC CATHETERIZATION     COLONOSCOPY  multiple   IR EMBO VENOUS NOT HEMORR HEMANG  INC GUIDE ROADMAPPING  02/08/2023   IR RADIOLOGIST EVAL & MGMT  01/08/2023   LUMBAR LAMINECTOMY/ DECOMPRESSION WITH MET-RX Left 09/13/2022   Procedure: Minimally Invasive  Laminectomy, Medical Facetectomy ,Microdiscectomy Left Lumbar Four-Five;  Surgeon: Bedelia Person, MD;  Location: [facility];  Service: Neurosurgery;  Laterality: Left;  3C   PROSTATE SURGERY     UMBILICAL HERNIA REPAIR  07/15/2021   [facility]   XI ROBOTIC ASSISTED SIMPLE PROSTATECTOMY  2022    Allergies: Levofloxacin, Lopid [gemfibrozil], Sulfonamide derivatives, and Sulfa antibiotics  Medications: Prior to Admission medications   Medication Sig Start Date End Date Taking? Authorizing Provider  amoxicillin-clavulanate (AUGMENTIN) 875-125 MG tablet Take 1 tablet by mouth 2 (two) times daily.  Take all of this medication 12/27/22   Mechele Claude, MD  apixaban (ELIQUIS) 5 MG TABS tablet TAKE  (1)  TABLET TWICE A DAY. 10/18/22   Delynn Flavin M, DO  atorvastatin (LIPITOR) 80 MG tablet TAKE ONE TABLET ONCE DAILY AT 6PM 10/18/22   Delynn Flavin M, DO  Calcium Carb-Cholecalciferol (CALCIUM 600+D3 PO) Take 1 capsule by mouth daily.    [provider]  carboxymethylcellulose (REFRESH PLUS) 0.5 % SOLN Place 1 drop into both eyes 3 (three) times daily as needed (dry eyes).    [provider]  Cholecalciferol (VITAMIN D3) 1000 UNITS CAPS Take 1,000 Units by mouth daily.     [provider]  dapsone 100 MG tablet Take 1 tablet (100 mg total) by mouth daily. 12/27/22   Mechele Claude, MD  diltiazem (CARDIZEM CD) 180 MG 24 hr capsule Take 1 capsule (180 mg total) by mouth daily. 10/18/22   Raliegh Ip, DO  diltiazem (CARDIZEM) 60 MG tablet Take 1 tablet (60 mg total) by mouth as needed. 10/18/22   Raliegh Ip, DO  fluticasone (FLONASE) 50 MCG/ACT nasal spray Place 2 sprays into both nostrils daily. 11/01/22   Delynn Flavin M, DO  GLUCOSAMINE-CHONDROITIN PO Take 1,500 mg by mouth daily.    [provider]  Multiple Vitamins-Minerals (MULTIVITAMIN WITH MINERALS) tablet Take 1 tablet by mouth daily.    [provider]  nitroGLYCERIN (NITROSTAT) 0.4 MG SL tablet Place 1 tablet (0.4 mg total) under the tongue every 5 (five) minutes as needed for chest pain. 10/18/22   Raliegh Ip, DO  NON FORMULARY Pt uses a cpap nightly    [provider]  Probiotic Product (ALIGN PO) Take 1 capsule by mouth daily.    [provider]  triamcinolone cream (KENALOG) 0.1 % Apply 1 Application topically 2 (two) times daily. 10/18/22   Raliegh Ip, DO     Family History  Problem Relation Age of Onset   Heart attack Mother    Diabetes Mother    Macular degeneration Sister    Obesity Sister    Paranoid behavior Sister    Dementia Sister    Cancer Sister        lung - uterus    COPD Sister    Heart attack Brother    Heart disease Brother        cause of death heart attack   Cancer Daughter        colon    Stroke Maternal Grandfather    Stroke Paternal Grandfather    Colon cancer Neg Hx     Social History   Socioeconomic History   Marital status: Married    Spouse name: [name]    Number of children: 3   Years of education: bachelors   Highest education level: Bachelor's degree (e.g., BA, AB, BS)  Occupational History   Occupation: retired    Comment: Advice worker [employer]  Tobacco Use    Smoking status: Never   Smokeless tobacco: Never   Tobacco comments:    Never smoke 07/27/22  Vaping Use   Vaping status: Never Used  Substance and Sexual Activity   Alcohol use: No   Drug use: No   Sexual activity: Yes  Other Topics Concern   Not on file  Social History Narrative   Married to [place], he has children and grandchildren.   [place], raises chickens, retired Copywriter, advertising    Financially cares for his disabled  nephew and recently started caring for his disabled sister who resides in a nursing facility in [place]   Social Determinants of Health   Financial Resource Strain: Low Risk  (11/23/2022)   Overall Financial Resource Strain (CARDIA)    Difficulty of Paying Living Expenses: Not hard at all  Food Insecurity: No Food Insecurity (11/23/2022)   Hunger Vital Sign    Worried About Running Out of Food in the Last Year: Never true    Ran Out of Food in the Last Year: Never true  Transportation Needs: No Transportation Needs (11/23/2022)   PRAPARE - Administrator, Civil Service (Medical): No    Lack of Transportation (Non-Medical): No  Physical Activity: Sufficiently Active (11/23/2022)   Exercise Vital Sign    Days of Exercise per Week: 5 days    Minutes of Exercise per Session: 30 min  Stress: No Stress Concern Present (11/23/2022)   [employer] of Occupational Health - Occupational Stress Questionnaire    Feeling of Stress : Not at all  Social Connections: Socially Integrated (11/23/2022)   Social Connection and Isolation Panel [NHANES]    Frequency of Communication with Friends and Family: More than three times a week    Frequency of Social Gatherings with Friends and Family: More than three times a week    Attends Religious Services: More than 4 times per year    Active Member of [employer] or Organizations: Yes    Attends Engineer, structural: More than 4 times per year    Marital Status: Married     Vital Signs: There were  no vitals taken for this visit.  Physical Exam Constitutional:      General: He is not in acute distress. HENT:     Head: Normocephalic.     Mouth/Throat:     Mouth: Mucous membranes are moist.  Eyes:     General: No scleral icterus. Cardiovascular:     Rate and Rhythm: Normal rate and regular rhythm.  Pulmonary:     Effort: Pulmonary effort is normal.  Abdominal:     General: There is no distension.  Musculoskeletal:        General: No swelling.     Right lower leg: No edema.     Left lower leg: No edema.  Skin:    General: Skin is warm and dry.     Comments: Bilateral lower extremity hyperpigmentation around the ankles.  Neurological:     Mental Status: He is alert and oriented to person, place, and time.     Imaging: Lower extremity venous duplex (01/01/23) IMPRESSION: 1. There is left greater than right bilateral lower extremity superficial venous insufficiency. 2. Extensive superficial perforating vein network in the left lower extremity with reflux observed at the saphenofemoral junction, central greater saphenous vein, and greater saphenous vein in the mid calf. There is also reflux in the short saphenous vein extending into the vein of Giacomini. 3. Superficial venous reflux in the right lower extremity is limited to the saphenofemoral junction. 4. Bilateral perforating pelvic veins are present. 5. No evidence of bilateral lower extremity deep vein thrombosis.    FINDINGS: RIGHT Deep Venous System:  Evaluation of the deep venous system including the common femoral, femoral, profunda femoral, popliteal and calf veins (where visible) demonstrate no evidence of deep venous thrombosis. The vessels are compressible and demonstrate normal respiratory phasicity and response to augmentation. No evidence of deep venous reflux.  RIGHT Superficial Venous System:  SFJ: 8.3 mm, reflux  measured at 0.9 seconds. A perforating drain is observed draining into the  pelvis.  GSV Prox Thigh: 5 mm, no reflux. A perforator vein is seen draining into the central greater saphenous vein which measures 3.1 mm in diameter with reflux of 1.8 seconds.  GSV Mid Thigh: No reflux noted.  GSV Lower Thigh: No reflux noted.  GSV Prox Calf: No reflux noted.  GSV Mid Calf: No reflux noted. There is an incompetent perforating vein identified.  GSV Distal Calf: No reflux.  SPJ: No reflux noted. 6.8 mm diameter.  SSV Prox: No reflux noted. 4.6 mm in diameter.  SSV Mid: No reflux noted.  SSV Distal: No reflux noted.  Other: Simple appearing fluid collection in the popliteal fossa measuring up to approximately 3.8 cm.  LEFT Deep Venous System:  Evaluation of the deep venous system including the common femoral, femoral, profunda femoral, popliteal and calf veins (where visible) demonstrate no evidence of deep venous thrombosis. The vessels are compressible and demonstrate normal respiratory phasicity and response to augmentation. No evidence of deep venous reflux.  LEFT Superficial Venous System:  SFJ: 9.6 mm, 5 seconds of reflux observed. The perforating vein is observed draining into the pelvis.  GSV Prox Thigh: 4.2 mm, 0.83 seconds of reflux observed. There is a perforator vein draining into the central greater saphenous vein measures up to 4.5 mm in diameter.  GSV Mid Thigh: 4.4 mm, no reflux.  GSV Lower Thigh: No reflux noted.  GSV Prox Calf: No reflux noted.  GSV Mid Calf: 4 mm diameter, 2.9 seconds of reflux observed.  GSV Distal Calf: No reflux observed.  SPJ: 2.9 mm, no reflux observed. There are 2 incompetent perforator veins in the calf measuring up to 4.8 mm. There is central extension to the vein of Giacomini which in the calf measures up to 3.6 mm in diameter with reflux of 3.5 seconds.  SSV Prox: 4.4 mm, 3.3 seconds reflux observed.  SSV Mid: No reflux observed.  SSV Distal: No reflux observed peer  Other: Simple  appearing fluid collection in the popliteal fossa measuring up to approximately 3.9 cm.      CTV AP  IMPRESSION: VASCULAR No evidence of pelvic venous disorder.  NON-VASCULAR 1. Benign-appearing simple hepatic and renal cysts are visualized as described. Neither of which require additional follow-up. 2. Evidence of prior granulomatous disease.    US venous LLE (02/16/23) IMPRESSION: 1. Successful closure of the left lower extremity greater saphenous vein. 2. No evidence of left lower extremity deep venous thrombosis. 3. Successful decompression of bilateral Baker cyst.    Labs:  CBC: Recent Labs    04/18/22 0941 09/05/22 1038  WBC 6.8 8.2  HGB 14.1 14.8  HCT 42.1 45.4  PLT 218 205    COAGS: No results for input(s): "INR", "APTT" in the last 8760 hours.  BMP: Recent Labs    09/05/22 1038 10/18/22 0937  NA 139 140  K 4.1 4.3  CL 104 102  CO2 29 23  GLUCOSE 94 90  BUN 19 22  CALCIUM 8.7* 9.0  CREATININE 0.98 0.97  GFRNONAA >60  --     LIVER FUNCTION TESTS: Recent Labs    10/18/22 0937  BILITOT 0.8  AST 27  ALT 25  ALKPHOS 78  PROT 6.6  ALBUMIN 3.8    Assessment and Plan: 77 year old male with bilateral, left greater than right, lower extremity superficial venous reflux (C4EpAs/pPr).   He is 1 week status post left GSV EVLT and bilateral  Baker cyst aspirations.  He is doing very will with minimal tenderness in the left thigh concordant with a perforating vessel.  Ultrasound today shows complete obliteration of the left GSV.  Baker cysts have not re-accumulated.     Follow up in 3 months with repeat left lower extremity venous insufficiency ultrasound.  Electronically Signed: Bennie Dallas 02/16/2023, 8:51 AM   I spent a total of 40 Minutes in face to face in clinical consultation, greater than 50% of which was counseling/coordinating care for lower extremity venous insufficiency.

## 2023-04-16 ENCOUNTER — Other Ambulatory Visit: Payer: Self-pay

## 2023-04-19 DIAGNOSIS — L821 Other seborrheic keratosis: Secondary | ICD-10-CM | POA: Diagnosis not present

## 2023-04-19 DIAGNOSIS — D225 Melanocytic nevi of trunk: Secondary | ICD-10-CM | POA: Diagnosis not present

## 2023-04-19 DIAGNOSIS — L817 Pigmented purpuric dermatosis: Secondary | ICD-10-CM | POA: Diagnosis not present

## 2023-04-19 DIAGNOSIS — L812 Freckles: Secondary | ICD-10-CM | POA: Diagnosis not present

## 2023-04-19 DIAGNOSIS — D1801 Hemangioma of skin and subcutaneous tissue: Secondary | ICD-10-CM | POA: Diagnosis not present

## 2023-04-19 DIAGNOSIS — L570 Actinic keratosis: Secondary | ICD-10-CM | POA: Diagnosis not present

## 2023-04-19 DIAGNOSIS — Z85828 Personal history of other malignant neoplasm of skin: Secondary | ICD-10-CM | POA: Diagnosis not present

## 2023-04-24 ENCOUNTER — Other Ambulatory Visit: Payer: Self-pay

## 2023-04-24 DIAGNOSIS — R350 Frequency of micturition: Secondary | ICD-10-CM | POA: Diagnosis not present

## 2023-04-24 DIAGNOSIS — E782 Mixed hyperlipidemia: Secondary | ICD-10-CM

## 2023-04-25 ENCOUNTER — Ambulatory Visit: Payer: Medicare Other | Admitting: Adult Health

## 2023-04-25 ENCOUNTER — Encounter: Payer: Self-pay | Admitting: Adult Health

## 2023-04-25 VITALS — BP 113/86 | HR 82 | Ht 74.0 in | Wt 224.8 lb

## 2023-04-25 DIAGNOSIS — G4733 Obstructive sleep apnea (adult) (pediatric): Secondary | ICD-10-CM

## 2023-04-25 NOTE — Patient Instructions (Addendum)
Your Plan:  Continue nightly CPAP use  We will adjust the pressure slightly to see if this further helps control your sleep apnea  Continue to follow with DME for any needed supplies or CPAP related concerns     Follow up in 6 months or call earlier if needed     Thank you for coming to see [place] at [facility]. I hope we have been able to provide you high quality care today.  You may receive a patient satisfaction survey over the next few weeks. We would appreciate your feedback and comments so that we may continue to improve ourselves and the health of our patients.

## 2023-04-25 NOTE — Progress Notes (Signed)
[facility] [address] [place]. [place] [address] [phone]       OFFICE FOLLOW UP NOTE  Mr. Jordan [name] Date of Birth:  1946/02/24 Medical Record Number:  [id number]    Primary neurologist: Dr. Vickey Huger Reason for visit:CPAP follow-up    SUBJECTIVE:   CHIEF COMPLAINT:  Chief Complaint  Patient presents with   Follow-up    Patient in room # 8 and alone. Patient states back in May his pressure on his machine was changed that has helped.    Follow-up visit:  Prior visit: 10/16/2022 with Dr. Vickey Huger  Brief HPI:   Jordan [name] is a 77 y.o. male diagnosed with sleep apnea in 2021 after AHI in 02/2020 showed very mild sleep apnea with AHI 5.5/h, NREM AHI 3.7/h and REM AHI of 11.7/h.  Given degree of sleepiness with ESS 18/24 and other comorbidities, recommended initiation of AutoPap. autoCPAP set up 09/09/2020.   At prior visit with Dr. Vickey Huger, noted excellent compliance but elevated residual AHI at 8.1/h therefore pressure adjusted.    Interval history:  Patient notes improvement of apneas with increased pressure, tolerating pressure well.  He has also noticed lower AHI laying on his side or head of bed slightly elevated.  Reports continued benefit with CPAP use.  ESS 3/24.  He tries to stay active routinely playing pickle ball and riding his bike.       ROS:   14 system review of systems performed and negative with exception of those listed in HPI  PMH:  Past Medical History:  Diagnosis Date   Anxiety    Mild   Atrial fibrillation (HCC)    Paroxysmal   BPH (benign prostatic hyperplasia)    CAD (coronary artery disease)    Stent LAD, 2002  /  nuclear 2009, no ischemia  /    Hospital October, 2011 nuclear, no ischemia, possible slight apical scar, ejection fraction 70%   Cancer (HCC)    prostate CA-non aggressive   Carotid bruit    Doppler November, 2011, normal   Chest pain 02/23/2013   Chronic anticoagulation    Low CHADS ,  score, but patient wants to be aggressive   Colon polyps    COVID-19    + long Covid problems   Drug intolerance    Mild beta blocker intolerance   Dyslipidemia    Ejection fraction    EF 65-70%, echo, 2007 /  EF 70%, nuclear, 2011   Hyperlipidemia    IBS (irritable bowel syndrome)    Incomplete RBBB    Knee pain    left   Personal history of colonic adenomas 11/29/2006   Qualifier: Diagnosis of  By: Genelle Gather CMA, [place]     Sleep apnea    Syncope    November, 2010    PSH:  Past Surgical History:  Procedure Laterality Date   ATRIAL FIBRILLATION ABLATION N/A 10/25/2021   Procedure: ATRIAL FIBRILLATION ABLATION;  Surgeon: Regan Lemming, MD;  Location: [facility]VASIVE [facility];  Service: Cardiovascular;  Laterality: N/A;   BACK SURGERY  05/1990   [id number]   CARDIAC CATHETERIZATION     COLONOSCOPY  multiple   IR EMBO VENOUS NOT HEMORR HEMANG  INC GUIDE ROADMAPPING  02/08/2023   IR RADIOLOGIST EVAL & MGMT  01/08/2023   IR RADIOLOGIST EVAL & MGMT  02/16/2023   LUMBAR LAMINECTOMY/ DECOMPRESSION WITH MET-RX Left 09/13/2022   Procedure: Minimally Invasive  Laminectomy, Medical Facetectomy ,Microdiscectomy Left Lumbar Four-Five;  Surgeon: Hoyt Koch  G, MD;  Location: [facility] OR;  Service: Neurosurgery;  Laterality: Left;  3C   PROSTATE SURGERY     UMBILICAL HERNIA REPAIR  07/15/2021   [facility]   XI ROBOTIC ASSISTED SIMPLE PROSTATECTOMY  2022    Social History:  Social History   Socioeconomic History   Marital status: Married    Spouse name: [name]    Number of children: 3   Years of education: bachelors   Highest education level: Bachelor's degree (e.g., BA, AB, BS)  Occupational History   Occupation: retired    Comment: Advice worker Lab  Tobacco Use   Smoking status: Never   Smokeless tobacco: Never   Tobacco comments:    Never smoke 07/27/22  Vaping Use   Vaping status: Never Used  Substance and Sexual Activity   Alcohol use: No   Drug use: No   Sexual  activity: Yes  Other Topics Concern   Not on file  Social History Narrative   Married to [place], he has children and grandchildren.   [place], raises chickens, retired Copywriter, advertising    Financially cares for his disabled nephew and recently started caring for his disabled sister who resides in a nursing facility in [place]   Social Determinants of Health   Financial Resource Strain: Low Risk  (11/23/2022)   Overall Financial Resource Strain (CARDIA)    Difficulty of Paying Living Expenses: Not hard at all  Food Insecurity: No Food Insecurity (11/23/2022)   Hunger Vital Sign    Worried About Running Out of Food in the Last Year: Never true    Ran Out of Food in the Last Year: Never true  Transportation Needs: No Transportation Needs (11/23/2022)   PRAPARE - Administrator, Civil Service (Medical): No    Lack of Transportation (Non-Medical): No  Physical Activity: Sufficiently Active (11/23/2022)   Exercise Vital Sign    Days of Exercise per Week: 5 days    Minutes of Exercise per Session: 30 min  Stress: No Stress Concern Present (11/23/2022)   [employer] of Occupational Health - Occupational Stress Questionnaire    Feeling of Stress : Not at all  Social Connections: Socially Integrated (11/23/2022)   Social Connection and Isolation Panel [NHANES]    Frequency of Communication with Friends and Family: More than three times a week    Frequency of Social Gatherings with Friends and Family: More than three times a week    Attends Religious Services: More than 4 times per year    Active Member of [employer] or Organizations: Yes    Attends Engineer, structural: More than 4 times per year    Marital Status: Married  Catering manager Violence: Not At Risk (11/23/2022)   Humiliation, Afraid, Rape, and Kick questionnaire    Fear of Current or Ex-Partner: No    Emotionally Abused: No    Physically Abused: No    Sexually Abused: No    Family History:   Family History  Problem Relation Age of Onset   Heart attack Mother    Diabetes Mother    Macular degeneration Sister    Obesity Sister    Paranoid behavior Sister    Dementia Sister    Cancer Sister        lung - uterus    COPD Sister    Heart attack Brother    Heart disease Brother        cause of death heart attack  Cancer Daughter        colon    Stroke Maternal Grandfather    Stroke Paternal Grandfather    Colon cancer Neg Hx     Medications:   Current Outpatient Medications on File Prior to Visit  Medication Sig Dispense Refill   apixaban (ELIQUIS) 5 MG TABS tablet TAKE  (1)  TABLET TWICE A DAY. 180 tablet 3   atorvastatin (LIPITOR) 80 MG tablet TAKE ONE TABLET ONCE DAILY AT 6PM 90 tablet 3   Calcium Carb-Cholecalciferol (CALCIUM 600+D3 PO) Take 1 capsule by mouth daily.     carboxymethylcellulose (REFRESH PLUS) 0.5 % SOLN Place 1 drop into both eyes 3 (three) times daily as needed (dry eyes).     Cholecalciferol (VITAMIN D3) 1000 UNITS CAPS Take 1,000 Units by mouth daily.     diltiazem (CARDIZEM CD) 180 MG 24 hr capsule Take 1 capsule (180 mg total) by mouth daily. 90 capsule 3   diltiazem (CARDIZEM) 60 MG tablet Take 1 tablet (60 mg total) by mouth as needed. 90 tablet 3   fluticasone (FLONASE) 50 MCG/ACT nasal spray Place 2 sprays into both nostrils daily. 48 g 3   GLUCOSAMINE-CHONDROITIN PO Take 1,500 mg by mouth daily.     Multiple Vitamins-Minerals (MULTIVITAMIN WITH MINERALS) tablet Take 1 tablet by mouth daily.     nitroGLYCERIN (NITROSTAT) 0.4 MG SL tablet Place 1 tablet (0.4 mg total) under the tongue every 5 (five) minutes as needed for chest pain. 25 tablet 3   NON FORMULARY Pt uses a cpap nightly     Probiotic Product (ALIGN PO) Take 1 capsule by mouth daily.     triamcinolone cream (KENALOG) 0.1 % Apply 1 Application topically 2 (two) times daily. (Patient not taking: Reported on 04/25/2023) 30 g 0   No current facility-administered medications on file  prior to visit.    Allergies:   Allergies  Allergen Reactions   Levofloxacin Other (See Comments)    Couldn't breathe, passed out   Lopid [Gemfibrozil] Other (See Comments)    Loose bowels   Sulfonamide Derivatives Hives   Sulfa Antibiotics Rash      OBJECTIVE:  Physical Exam  Vitals:   04/25/23 1102  BP: 113/86  Pulse: 82  Weight: 224 lb 12.8 oz (102 kg)  Height: 6\' 2"  (1.88 m)   Body mass index is 28.86 kg/m. No results found.   General: well developed, well nourished, very pleasant elderly Caucasian male, seated, in no evident distress  Neurologic Exam Mental Status: Awake and fully alert. Oriented to place and time. Recent and remote memory intact. Attention span, concentration and fund of knowledge appropriate. Mood and affect appropriate.  Cranial Nerves: Pupils equal, briskly reactive to light. Extraocular movements full without nystagmus. Visual fields full to confrontation. Hearing intact. Facial sensation intact. Face, tongue, palate moves normally and symmetrically.  Motor: Normal bulk and tone. Normal strength in all tested extremity muscles Gait and Station: Arises from chair without difficulty. Stance is normal. Gait demonstrates normal stride length and balance without use of AD. Tandem walk and heel toe without difficulty.  Reflexes: 1+ and symmetric. Toes downgoing.         ASSESSMENT/PLAN: Jordan [name] is a 77 y.o. year old male    OSA on CPAP : Residual AHI still very slightly elevated at 5.7 on pressure setting of 5-15, pressure in the 95th percentile 14.6 with maximum pressure of 14.8.  Will slightly increase max pressure to 16. Discussed continued nightly usage with  ensuring greater than 4 hours nightly for optimal benefit and per insurance purposes.  Continue to follow with DME company for any needed supplies or CPAP related concerns     Follow up in 6 months or call earlier if needed   CC:  PCP: Raliegh Ip, DO    I  spent 25 minutes of face-to-face and non-face-to-face time with patient.  This included previsit chart review, lab review, study review, order entry, electronic health record documentation, patient education and discussion regarding above diagnoses and treatment plan and answered all other questions to patient's satisfaction  Ihor Austin, [facility]  [facility] [address] [place], [place] [address]  Phone [phone] Fax [phone] Note: This document was prepared with digital dictation and possible smart phrase technology. Any transcriptional errors that result from this process are unintentional.

## 2023-04-26 ENCOUNTER — Other Ambulatory Visit: Payer: Medicare Other

## 2023-04-26 DIAGNOSIS — E782 Mixed hyperlipidemia: Secondary | ICD-10-CM | POA: Diagnosis not present

## 2023-04-27 ENCOUNTER — Telehealth: Payer: Self-pay

## 2023-04-27 LAB — CMP14+EGFR
ALT: 29 [IU]/L (ref 0–44)
AST: 30 [IU]/L (ref 0–40)
Albumin: 3.9 g/dL (ref 3.8–4.8)
Alkaline Phosphatase: 82 [IU]/L (ref 44–121)
BUN/Creatinine Ratio: 24 (ref 10–24)
BUN: 23 mg/dL (ref 8–27)
Bilirubin Total: 0.7 mg/dL (ref 0.0–1.2)
CO2: 25 mmol/L (ref 20–29)
Calcium: 9.1 mg/dL (ref 8.6–10.2)
Chloride: 102 mmol/L (ref 96–106)
Creatinine, Ser: 0.97 mg/dL (ref 0.76–1.27)
Globulin, Total: 2.5 g/dL (ref 1.5–4.5)
Glucose: 90 mg/dL (ref 70–99)
Potassium: 4.4 mmol/L (ref 3.5–5.2)
Sodium: 140 mmol/L (ref 134–144)
Total Protein: 6.4 g/dL (ref 6.0–8.5)
eGFR: 81 mL/min/{1.73_m2} (ref >59)

## 2023-04-27 LAB — CBC WITH DIFFERENTIAL/PLATELET
Basophils Absolute: 0.1 10*3/uL (ref 0.0–0.2)
Basos: 1 %
EOS (ABSOLUTE): 0.3 10*3/uL (ref 0.0–0.4)
Eos: 4 %
Hematocrit: 44.5 % (ref 37.5–51.0)
Hemoglobin: 14.5 g/dL (ref 13.0–17.7)
Immature Grans (Abs): 0 10*3/uL (ref 0.0–0.1)
Immature Granulocytes: 0 %
Lymphocytes Absolute: 1.5 10*3/uL (ref 0.7–3.1)
Lymphs: 22 %
MCH: 29.5 pg (ref 26.6–33.0)
MCHC: 32.6 g/dL (ref 31.5–35.7)
MCV: 91 fL (ref 79–97)
Monocytes Absolute: 0.6 10*3/uL (ref 0.1–0.9)
Monocytes: 8 %
Neutrophils Absolute: 4.5 10*3/uL (ref 1.4–7.0)
Neutrophils: 65 %
Platelets: 246 10*3/uL (ref 150–450)
RBC: 4.91 x10E6/uL (ref 4.14–5.80)
RDW: 12.5 % (ref 11.6–15.4)
WBC: 6.9 10*3/uL (ref 3.4–10.8)

## 2023-04-27 LAB — LIPID PANEL
Chol/HDL Ratio: 3.4 ratio (ref 0.0–5.0)
Cholesterol, Total: 124 mg/dL (ref 100–199)
HDL: 37 mg/dL — ABNORMAL LOW (ref >39)
LDL Chol Calc (NIH): 69 mg/dL (ref 0–99)
Triglycerides: 95 mg/dL (ref 0–149)
VLDL Cholesterol Cal: 18 mg/dL (ref 5–40)

## 2023-05-02 ENCOUNTER — Encounter: Payer: Self-pay | Admitting: Family Medicine

## 2023-05-02 ENCOUNTER — Ambulatory Visit: Payer: Medicare Other | Admitting: Family Medicine

## 2023-05-02 VITALS — BP 108/59 | HR 68 | Temp 98.6°F | Ht 74.0 in | Wt 225.0 lb

## 2023-05-02 DIAGNOSIS — Z8546 Personal history of malignant neoplasm of prostate: Secondary | ICD-10-CM

## 2023-05-02 DIAGNOSIS — N3944 Nocturnal enuresis: Secondary | ICD-10-CM | POA: Diagnosis not present

## 2023-05-02 DIAGNOSIS — I480 Paroxysmal atrial fibrillation: Secondary | ICD-10-CM | POA: Diagnosis not present

## 2023-05-02 DIAGNOSIS — Z7185 Encounter for immunization safety counseling: Secondary | ICD-10-CM | POA: Diagnosis not present

## 2023-05-02 NOTE — Progress Notes (Signed)
Subjective: CC: Chronic follow-up PCP: Raliegh Ip, DO [id number] Jordan [name] is a 77 y.o. male presenting to clinic today for:  1.  History of prostate cancer with urinary leakage Patient notes that since her last visit he is established with a new urologist.  [facility] was [facility]  He is now under the care of Dr. Luane School.  He is going to trial him on Myrbetriq 50 mg for the urinary leakage he experiences at nighttime.  He notes that he is sleeping very soundly with the CPAP machine and does have some leakage that requires him to wear depends at nighttime.  He would like to get his PSA rechecked and would like this sent to his urologist.  2.  Varicose veins Patient notes that he had veins treated in the left thigh by Dr. Kaylyn Lim.  He reports he is doing very well from this treatment and has been extremely satisfied with the care he received there.  3.  Atrial fibrillation Patient reports no significant episodes of atrial fibrillation over the last year.  He is tolerating Eliquis without difficulty reports no bleeding.  He is in the donut hole sadly but is affording his medications right now.   ROS: Per HPI  Allergies  Allergen Reactions   Levofloxacin Other (See Comments)    Couldn't breathe, passed out   Lopid [Gemfibrozil] Other (See Comments)    Loose bowels   Sulfonamide Derivatives Hives   Sulfa Antibiotics Rash   Past Medical History:  Diagnosis Date   Anxiety    Mild   Atrial fibrillation (HCC)    Paroxysmal   BPH (benign prostatic hyperplasia)    CAD (coronary artery disease)    Stent LAD, 2002  /  nuclear 2009, no ischemia  /    Hospital October, 2011 nuclear, no ischemia, possible slight apical scar, ejection fraction 70%   Cancer (HCC)    prostate CA-non aggressive   Carotid bruit    Doppler November, 2011, normal   Chest pain 02/23/2013   Chronic anticoagulation    Low CHADS , score, but patient wants to be aggressive    Colon polyps    COVID-19    + long Covid problems   Drug intolerance    Mild beta blocker intolerance   Dyslipidemia    Ejection fraction    EF 65-70%, echo, 2007 /  EF 70%, nuclear, 2011   History of meniscal tear 10/18/2021   Hyperlipidemia    IBS (irritable bowel syndrome)    Incomplete RBBB    Knee pain    left   Palpitations    Personal history of colonic adenomas 11/29/2006   Qualifier: Diagnosis of  By: Genelle Gather CMA, [place]     Sleep apnea    Syncope    November, 2010   Traumatic tear of lateral meniscus of right knee 03/26/2011    Current Outpatient Medications:    apixaban (ELIQUIS) 5 MG TABS tablet, TAKE  (1)  TABLET TWICE A DAY., Disp: 180 tablet, Rfl: 3   atorvastatin (LIPITOR) 80 MG tablet, TAKE ONE TABLET ONCE DAILY AT 6PM, Disp: 90 tablet, Rfl: 3   Calcium Carb-Cholecalciferol (CALCIUM 600+D3 PO), Take 1 capsule by mouth daily., Disp: , Rfl:    carboxymethylcellulose (REFRESH PLUS) 0.5 % SOLN, Place 1 drop into both eyes 3 (three) times daily as needed (dry eyes)., Disp: , Rfl:    Cholecalciferol (VITAMIN D3) 1000 UNITS CAPS, Take 1,000 Units by mouth  daily., Disp: , Rfl:    diltiazem (CARDIZEM CD) 180 MG 24 hr capsule, Take 1 capsule (180 mg total) by mouth daily., Disp: 90 capsule, Rfl: 3   diltiazem (CARDIZEM) 60 MG tablet, Take 1 tablet (60 mg total) by mouth as needed., Disp: 90 tablet, Rfl: 3   fluticasone (FLONASE) 50 MCG/ACT nasal spray, Place 2 sprays into both nostrils daily., Disp: 48 g, Rfl: 3   GLUCOSAMINE-CHONDROITIN PO, Take 1,500 mg by mouth daily., Disp: , Rfl:    Multiple Vitamins-Minerals (MULTIVITAMIN WITH MINERALS) tablet, Take 1 tablet by mouth daily., Disp: , Rfl:    nitroGLYCERIN (NITROSTAT) 0.4 MG SL tablet, Place 1 tablet (0.4 mg total) under the tongue every 5 (five) minutes as needed for chest pain., Disp: 25 tablet, Rfl: 3   NON FORMULARY, Pt uses a cpap nightly, Disp: , Rfl:    Probiotic Product (ALIGN PO), Take 1 capsule by mouth  daily., Disp: , Rfl:  Social History   Socioeconomic History   Marital status: Married    Spouse name: [name]    Number of children: 3   Years of education: bachelors   Highest education level: Bachelor's degree (e.g., BA, AB, BS)  Occupational History   Occupation: retired    Comment: Advice worker Lab  Tobacco Use   Smoking status: Never   Smokeless tobacco: Never   Tobacco comments:    Never smoke 07/27/22  Vaping Use   Vaping status: Never Used  Substance and Sexual Activity   Alcohol use: No   Drug use: No   Sexual activity: Yes  Other Topics Concern   Not on file  Social History Narrative   Married to [place], he has children and grandchildren.   Lives [place] acres, raises chickens, retired Copywriter, advertising    Financially cares for his disabled nephew and recently started caring for his disabled sister who resides in a nursing facility in [place]   Social Determinants of Health   Financial Resource Strain: Low Risk  (05/01/2023)   Overall Financial Resource Strain (CARDIA)    Difficulty of Paying Living Expenses: Not hard at all  Food Insecurity: No Food Insecurity (05/01/2023)   Hunger Vital Sign    Worried About Running Out of Food in the Last Year: Never true    Ran Out of Food in the Last Year: Never true  Transportation Needs: No Transportation Needs (05/01/2023)   PRAPARE - Administrator, Civil Service (Medical): No    Lack of Transportation (Non-Medical): No  Physical Activity: Sufficiently Active (05/01/2023)   Exercise Vital Sign    Days of Exercise per Week: 6 days    Minutes of Exercise per Session: 50 min  Stress: No Stress Concern Present (05/01/2023)   [employer] of Occupational Health - Occupational Stress Questionnaire    Feeling of Stress : Not at all  Social Connections: Socially Integrated (05/01/2023)   Social Connection and Isolation Panel [NHANES]    Frequency of Communication with Friends and Family:  More than three times a week    Frequency of Social Gatherings with Friends and Family: Three times a week    Attends Religious Services: More than 4 times per year    Active Member of Clubs or Organizations: Yes    Attends Banker Meetings: More than 4 times per year    Marital Status: Married  Catering manager Violence: Not At Risk (11/23/2022)   Humiliation, Afraid, Rape, and Kick questionnaire  Fear of Current or Ex-Partner: No    Emotionally Abused: No    Physically Abused: No    Sexually Abused: No   Family History  Problem Relation Age of Onset   Heart attack Mother    Diabetes Mother    Macular degeneration Sister    Obesity Sister    Paranoid behavior Sister    Dementia Sister    Cancer Sister        lung - uterus    COPD Sister    Heart attack Brother    Heart disease Brother        cause of death heart attack   Cancer Daughter        colon    Stroke Maternal Grandfather    Stroke Paternal Grandfather    Colon cancer Neg Hx     Objective: Office vital signs reviewed. BP (!) 108/59   Pulse 68   Temp 98.6 F (37 C)   Ht 6\' 2"  (1.88 m)   Wt 225 lb (102.1 kg)   SpO2 96%   BMI 28.89 kg/m   Physical Examination:  General: Awake, alert, well nourished, No acute distress HEENT: sclera white, MMM Cardio: regular rate and rhythm, S1S2 heard, no murmurs appreciated Pulm: clear to auscultation bilaterally, no wheezes, rhonchi or rales; normal work of breathing on room air  Assessment/ Plan: 77 y.o. male   Paroxysmal atrial fibrillation (HCC)  History of prostate cancer - Plan: PSA, CANCELED: PSA  Vaccine counseling  Nocturnal enuresis  Rate and rhythm controlled today.  I gave him a sample pack of Eliquis today.  Welcome to contact me if he needs more prior to new year  Future order for PSA placed.  6 months will be December 16 for checkup.  He will schedule lab appointment prior to discharge and I will CC results to his new  urologist  Counseled him on RSV vaccination.  He will be trialing Myrbetriq 50 mg.  I have added this to his med list   Raliegh Ip, DO [facility] [phone]

## 2023-05-15 ENCOUNTER — Ambulatory Visit (HOSPITAL_COMMUNITY): Payer: Medicare Other | Admitting: Physician Assistant

## 2023-05-29 ENCOUNTER — Other Ambulatory Visit: Payer: Medicare Other

## 2023-05-29 DIAGNOSIS — Z8546 Personal history of malignant neoplasm of prostate: Secondary | ICD-10-CM

## 2023-05-30 ENCOUNTER — Other Ambulatory Visit: Payer: Medicare Other

## 2023-05-30 LAB — PSA: Prostate Specific Ag, Serum: 1.4 ng/mL (ref 0.0–4.0)

## 2023-06-18 ENCOUNTER — Encounter (HOSPITAL_COMMUNITY): Payer: Self-pay | Admitting: Physician Assistant

## 2023-06-18 ENCOUNTER — Ambulatory Visit (HOSPITAL_COMMUNITY)
Admission: RE | Admit: 2023-06-18 | Discharge: 2023-06-18 | Disposition: A | Discharge status: Home / Self Care | Payer: Medicare Other | Source: Ambulatory Visit | Attending: Physician Assistant | Admitting: Physician Assistant

## 2023-06-18 VITALS — BP 124/80 | HR 65 | Ht 74.0 in | Wt 231.0 lb

## 2023-06-18 DIAGNOSIS — E785 Hyperlipidemia, unspecified: Secondary | ICD-10-CM | POA: Diagnosis not present

## 2023-06-18 DIAGNOSIS — G4733 Obstructive sleep apnea (adult) (pediatric): Secondary | ICD-10-CM | POA: Insufficient documentation

## 2023-06-18 DIAGNOSIS — Z7901 Long term (current) use of anticoagulants: Secondary | ICD-10-CM | POA: Diagnosis not present

## 2023-06-18 DIAGNOSIS — I480 Paroxysmal atrial fibrillation: Secondary | ICD-10-CM | POA: Insufficient documentation

## 2023-06-18 DIAGNOSIS — D6869 Other thrombophilia: Secondary | ICD-10-CM | POA: Diagnosis not present

## 2023-06-18 DIAGNOSIS — I2510 Atherosclerotic heart disease of native coronary artery without angina pectoris: Secondary | ICD-10-CM | POA: Diagnosis not present

## 2023-06-18 DIAGNOSIS — S46312A Strain of muscle, fascia and tendon of triceps, left arm, initial encounter: Secondary | ICD-10-CM | POA: Diagnosis not present

## 2023-06-18 NOTE — Progress Notes (Signed)
 Primary Care Physician: Jolinda Norene HERO, DO Primary Cardiologist: Dr Jeffrie Primary Electrophysiologist: Dr Inocencio Referring Physician: Dr Inocencio Claudean LELON Jordan [name] is a 78 y.o. male with a history of CAD, HLD, OSA, atrial fibrillation who presents for follow up in the [facility]. Patient is on Eliquis  for a CHADS2VASC score of 2. He underwent afib ablation with Dr Inocencio on 10/25/21.   On follow up today, patient reports that he has done well since his last visit overall. He did have two episodes of rapid heart rates on 12/29 and 12/31. He did a Kardia mobile strip which appears to be sinus tach vs atrial flutter, baseline artifact makes it difficult to see. He does admit he was under a lot of stress at the time. He has not had any further episodes. He did injure his left arm while moving items in his home and plans to go to the orthopedic urgent care later today.   Today, he denies symptoms of palpitations, chest pain, shortness of breath, orthopnea, PND, lower extremity edema, dizziness, presyncope, syncope, bleeding, or neurologic sequela. The patient is tolerating medications without difficulties and is otherwise without complaint today.    Atrial Fibrillation Risk Factors:  he does have symptoms or diagnosis of sleep apnea. he does not have a history of rheumatic fever.   Atrial Fibrillation Management history:  Previous antiarrhythmic drugs: none Previous cardioversions: none Previous ablations: 10/25/21 Anticoagulation history: Eliquis    Past Medical History:  Diagnosis Date   Anxiety    Mild   Atrial fibrillation (HCC)    Paroxysmal   BPH (benign prostatic hyperplasia)    CAD (coronary artery disease)    Stent LAD, 2002  /  nuclear 2009, no ischemia  /    Hospital October, 2011 nuclear, no ischemia, possible slight apical scar, ejection fraction 70%   Cancer (HCC)    prostate CA-non aggressive   Carotid bruit    Doppler November,  2011, normal   Chest pain 02/23/2013   Chronic anticoagulation    Low CHADS , score, but patient wants to be aggressive   Colon polyps    COVID-19    + long Covid problems   Drug intolerance    Mild beta blocker intolerance   Dyslipidemia    Ejection fraction    EF 65-70%, echo, 2007 /  EF 70%, nuclear, 2011   History of meniscal tear 10/18/2021   Hyperlipidemia    IBS (irritable bowel syndrome)    Incomplete RBBB    Knee pain    left   Palpitations    Personal history of colonic adenomas 11/29/2006   Qualifier: Diagnosis of  By: Nickola CMA, [place]     Sleep apnea    Syncope    November, 2010   Traumatic tear of lateral meniscus of right knee 03/26/2011    Current Outpatient Medications  Medication Sig Dispense Refill   apixaban  (ELIQUIS ) 5 MG TABS tablet TAKE  (1)  TABLET TWICE A DAY. 180 tablet 3   atorvastatin  (LIPITOR) 80 MG tablet TAKE ONE TABLET ONCE DAILY AT 6PM 90 tablet 3   Calcium  Carb-Cholecalciferol  (CALCIUM  600+D3 PO) Take 1 capsule by mouth daily.     carboxymethylcellulose (REFRESH PLUS) 0.5 % SOLN Place 1 drop into both eyes 3 (three) times daily as needed (dry eyes).     Cholecalciferol  (VITAMIN D3) 1000 UNITS CAPS Take 1,000 Units by mouth daily.     diltiazem  (CARDIZEM  CD) 180 MG 24 hr  capsule Take 1 capsule (180 mg total) by mouth daily. 90 capsule 3   diltiazem  (CARDIZEM ) 60 MG tablet Take 1 tablet (60 mg total) by mouth as needed. 90 tablet 3   fluticasone  (FLONASE ) 50 MCG/ACT nasal spray Place 2 sprays into both nostrils daily. 48 g 3   GLUCOSAMINE-CHONDROITIN PO Take 1,500 mg by mouth daily.     mirabegron  ER (MYRBETRIQ ) 50 MG TB24 tablet Take 50 mg by mouth daily.     Multiple Vitamins-Minerals (MULTIVITAMIN WITH MINERALS) tablet Take 1 tablet by mouth daily.     nitroGLYCERIN  (NITROSTAT ) 0.4 MG SL tablet Place 1 tablet (0.4 mg total) under the tongue every 5 (five) minutes as needed for chest pain. 25 tablet 3   NON FORMULARY Pt uses a cpap nightly      Probiotic Product (ALIGN PO) Take 1 capsule by mouth daily.     No current facility-administered medications for this encounter.    ROS- All systems are reviewed and negative except as per the HPI above.  Physical Exam: Vitals:   06/18/23 1352  BP: 124/80  Pulse: 65  Weight: 104.8 kg  Height: 6' 2 (1.88 m)    GEN: Well nourished, well developed in no acute distress NECK: No JVD; No carotid bruits CARDIAC: Regular rate and rhythm, no murmurs, rubs, gallops RESPIRATORY:  Clear to auscultation without rales, wheezing or rhonchi  ABDOMEN: Soft, non-tender, non-distended EXTREMITIES:  No edema; No deformity    Wt Readings from Last 3 Encounters:  06/18/23 104.8 kg  05/02/23 102.1 kg  04/25/23 102 kg    EKG today demonstrates  SR Vent. rate 65 BPM PR interval 140 ms QRS duration 104 ms QT/QTcB 392/407 ms   Echo 09/12/21 demonstrated  1. Left ventricular ejection fraction, by estimation, is 55 to 60%. Left  ventricular ejection fraction by 3D volume is 57 %. The left ventricle has normal function. The left ventricle has no regional wall motion  abnormalities. Left ventricular diastolic  parameters were normal.   2. Right ventricular systolic function is normal. The right ventricular  size is mildly enlarged. Tricuspid regurgitation signal is inadequate for  assessing PA pressure.   3. The mitral valve is normal in structure. Trivial mitral valve  regurgitation. No evidence of mitral stenosis.   4. The aortic valve is normal in structure. Aortic valve regurgitation is  not visualized. No aortic stenosis is present.   5. The inferior vena cava is dilated in size with >50% respiratory  variability, suggesting right atrial pressure of 8 mmHg.   Epic records are reviewed at length today  CHA2DS2-VASc Score = 3  The patient's score is based upon: CHF History: 0 HTN History: 0 Diabetes History: 0 Stroke History: 0 Vascular Disease History: 1 Age Score: 2 Gender  Score: 0       ASSESSMENT AND PLAN: Paroxysmal Atrial Fibrillation (ICD10:  I48.0) The patient's CHA2DS2-VASc score is 3, indicating a 3.2% annual risk of stroke.   S/p afib ablation 10/25/21 Patient in SR today. Patient will continue to monitor with Kardia mobile. If he has frequent tachycardia readings, will have him wear a Zio monitor to evaluate further.  Continue Eliquis  5 mg BID Continue diltiazem  180 mg daily with 60 mg PRN for breakthrough episodes.  Secondary Hypercoagulable State (ICD10:  D68.69) The patient is at significant risk for stroke/thromboembolism based upon his CHA2DS2-VASc Score of 3.  Continue Apixaban  (Eliquis ).   CAD No anginal symptoms  OSA  Encouraged nightly CPAP   Follow  up with Dr Jeffrie per recall, AF clinic in one year.    Daril Kicks PA-C [facility] [address] [place], [place] [address] [phone] 06/18/2023 2:04 PM

## 2023-07-19 ENCOUNTER — Ambulatory Visit: Payer: Medicare Other

## 2023-07-19 ENCOUNTER — Encounter: Payer: Self-pay | Admitting: Family

## 2023-07-19 VITALS — BP 125/68 | HR 77 | Temp 97.3°F | Wt 226.6 lb

## 2023-07-19 DIAGNOSIS — W57XXXA Bitten or stung by nonvenomous insect and other nonvenomous arthropods, initial encounter: Secondary | ICD-10-CM

## 2023-07-19 DIAGNOSIS — S30860A Insect bite (nonvenomous) of lower back and pelvis, initial encounter: Secondary | ICD-10-CM | POA: Diagnosis not present

## 2023-07-19 MED ORDER — DOXYCYCLINE HYCLATE 100 MG PO TABS: 100.0000 mg | ORAL_TABLET | Freq: Two times a day (BID) | ORAL | 0 refills | Status: DC

## 2023-07-19 NOTE — Patient Instructions (Signed)
 Insect Bite, Adult  An insect bite can make your skin red, itchy, and swollen. An insect bite is different from an insect sting, which happens when an insect injects poison (venom) into the skin.  Some insects can spread disease to people through a bite. However, most insect bites do not lead to disease and are not serious.  What are the causes?  Insects may bite for a variety of reasons, including:  Hunger.  To defend themselves.  Insects that bite include:  Spiders.  Mosquitoes and flies.  Ticks and fleas.  Ants.  Kissing bugs.  Chiggers.  What are the signs or symptoms?  In many cases, symptoms last for 2-4 days. However, itching can last up to 10 days. Symptoms include:  Itching or pain in the bite area.  Redness and swelling in the bite area.  An open wound (skin ulcer).  In rare cases, a person may have a severe allergic reaction (anaphylactic reaction) to a bite. Symptoms of an anaphylactic reaction may include:  Feeling warm in the face (flushed). This may include redness.  Itchy, red, swollen areas of skin (hives).  Swelling of the eyes, lips, face, mouth, tongue, or throat.  Wheezing or difficulty breathing, speaking, or swallowing.  Dizziness, light-headedness, or fainting.  Abdominal symptoms like cramping, nausea, vomiting, or diarrhea.  How is this diagnosed?  This condition is usually diagnosed based on symptoms and a physical exam. During the exam, your health care provider will look at the bite and ask you what kind of insect bit you.  How is this treated?  Most insect bites are not serious. Symptoms often go away on their own and treatment is not usually needed. When treatment is recommended, it may include:  Applying ice to the affected area.  Applying steroid or other anti-itch creams, like calamine lotion, to the bite area.  Medicines called antihistamines to reduce itching.  You may also need:  A tetanus shot if you are not up to date.  Antibiotic cream or an oral antibiotic if the bite becomes  infected (this is uncommon).  Follow these instructions at home:  Bite area care    Do not scratch the bite area. It may help to cover the bite area with a bandage or close-fitting clothing.  Keep the bite area clean and dry. Wash it every day with soap and water as told by your health care provider.  Check the bite area every day for signs of infection. Check for:  More redness, swelling, or pain.  Fluid or blood.  Warmth.  Pus or a bad smell.  Managing pain, itching, and swelling    You may apply cortisone cream, calamine lotion, or a paste made of baking soda and water to the bite area as told by your health care provider.  If directed, put ice on the bite area. To do this:  Put ice in a plastic bag.  Place a towel between your skin and the bag.  Leave the ice on for 20 minutes, 2-3 times a day.  If your skin turns bright red, remove the ice right away to prevent skin damage. The risk of skin damage is higher if you cannot feel pain, heat, or cold.  General instructions  Apply or take over-the-counter and prescription medicine only as told by your health care provider.  If you were prescribed antibiotics, take or apply them as told by your health care provider. Do not stop using the antibiotic even if you start to feel  better.  How is this prevented?  To help reduce your risk of insect bites:  When you are outdoors, wear clothing that covers your arms and legs. This is especially important in the early morning and evening.  Use insect repellent. The best insect repellents contain DEET, picaridin, oil of lemon eucalyptus (OLE), or IR3535.  Consider spraying your clothing with a pesticide called permethrin. Permethrin helps prevent insect bites. It works for several weeks and for up to 5-6 clothing washes. Do not apply permethrin directly to the skin.  If your home windows do not have screens, consider installing them.  If you will be sleeping in an area where there are mosquitoes, consider covering your sleeping  area with a mosquito net.  Contact a health care provider if:  Your bite area has signs of infection, such as:  More redness, swelling, or pain.  Fluid or blood.  Warmth.  Pus or a bad smell.  You have a fever.  Get help right away if:  You have a rash.  You have muscle or joint pain.  You feel unusually tired or weak.  You have neck pain or a headache.  You develop symptoms of an anaphylactic reaction. These may include:  Swelling of the eyes, lips, face, mouth, tongue, or throat.  Flushed skin or hives.  Wheezing.  Difficulty breathing, speaking, or swallowing.  Dizziness, light-headedness, or fainting.  Abdominal pain, cramping, vomiting, or diarrhea.  These symptoms may be an emergency. Get help right away. Call 911.  Do not wait to see if the symptoms will go away.  Do not drive yourself to the hospital.  Summary  An insect bite can make your skin red, itchy, and swollen.  Treatment is usually not needed. Symptoms often go away on their own. When treatment is recommended, it may involve taking medicine, applying medicine to the area, or applying ice.  Apply or take over-the-counter and prescription medicines only as told by your health care provider.  Use insect repellent to help prevent insect bites.  Contact a health care provider if your bite area has signs of infection.  This information is not intended to replace advice given to you by your health care provider. Make sure you discuss any questions you have with your health care provider.  Document Revised: 09/07/2021 Document Reviewed: 08/23/2021  Elsevier Patient Education  2024 [employer].

## 2023-07-19 NOTE — Progress Notes (Signed)
 Subjective:    Patient ID: Jordan [name], male    DOB: May 24, 1946, 78 y.o.   MRN: [id number]  Chief Complaint  Patient presents with   Insect Bite    On back left side    PT presents to the office with an insect bite on left back. He noticed the bite Saturday evening, but has worsening with erythemas a HPI    Review of Systems  Social History   Socioeconomic History   Marital status: Married    Spouse name: Jordan [name]    Number of children: 3   Years of education: bachelors   Highest education level: Bachelor's degree (e.g., BA, AB, BS)  Occupational History   Occupation: retired    Comment: [employer]  Tobacco Use   Smoking status: Never   Smokeless tobacco: Never   Tobacco comments:    Never smoke 07/27/22  Vaping Use   Vaping status: Never Used  Substance and Sexual Activity   Alcohol use: No   Drug use: No   Sexual activity: Yes  Other Topics Concern   Not on file  Social History Narrative   Married to Jordan [name], he has children and grandchildren.   Lives on [name], raises chickens, retired copywriter, advertising    Financially cares for his disabled nephew and recently started caring for his disabled sister who resides in a nursing facility in [name]   Social Drivers of Health   Financial Resource Strain: Low Risk  (05/01/2023)   Overall Financial Resource Strain (CARDIA)    Difficulty of Paying Living Expenses: Not hard at all  Food Insecurity: No Food Insecurity (05/01/2023)   Hunger Vital Sign    Worried About Running Out of Food in the Last Year: Never true    Ran Out of Food in the Last Year: Never true  Transportation Needs: No Transportation Needs (05/01/2023)   PRAPARE - Administrator, Civil Service (Medical): No    Lack of Transportation (Non-Medical): No  Physical Activity: Sufficiently Active (05/01/2023)   Exercise Vital Sign    Days of Exercise per Week: 6 days    Minutes of Exercise per Session: 50 min   Stress: No Stress Concern Present (05/01/2023)   [facility] [name] - Occupational Stress Questionnaire    Feeling of Stress : Not at all  Social Connections: Socially Integrated (05/01/2023)   Social Connection and Isolation Panel [NHANES]    Frequency of Communication with Friends and Family: More than three times a week    Frequency of Social Gatherings with Friends and Family: Three times a week    Attends Religious Services: More than 4 times per year    Active Member of Clubs or Organizations: Yes    Attends Engineer, Structural: More than 4 times per year    Marital Status: Married   Family History  Problem Relation Age of Onset   Heart attack Mother    Diabetes Mother    Macular degeneration Sister    Obesity Sister    Paranoid behavior Sister    Dementia Sister    Cancer Sister        lung - uterus    COPD Sister    Heart attack Brother    Heart disease Brother        cause of death heart attack   Cancer Daughter        colon    Stroke Maternal Grandfather    Stroke  Paternal Grandfather    Colon cancer Neg Hx         Objective:   Physical Exam Vitals reviewed.  Constitutional:      General: He is not in acute distress.    Appearance: He is well-developed.  HENT:     Head: Normocephalic.     Right Ear: Tympanic membrane and external ear normal.     Left Ear: Tympanic membrane and external ear normal.  Eyes:     General:        Right eye: No discharge.        Left eye: No discharge.     Pupils: Pupils are equal, round, and reactive to light.  Neck:     Thyroid : No thyromegaly.  Cardiovascular:     Rate and Rhythm: Normal rate and regular rhythm.     Heart sounds: Normal heart sounds. No murmur heard. Pulmonary:     Effort: Pulmonary effort is normal. No respiratory distress.     Breath sounds: Normal breath sounds. No wheezing.  Abdominal:     General: Bowel sounds are normal. There is no distension.     Palpations:  Abdomen is soft.     Tenderness: There is no abdominal tenderness.  Musculoskeletal:        General: No tenderness. Normal range of motion.     Cervical back: Normal range of motion and neck supple.  Skin:    General: Skin is warm and dry.     Findings: Rash present. No erythema. Rash is papular.          Comments: Papule approx 0.4X0.3 with erythemas extending to 4X1.5 cm, no discharge, mild tenderness noted   Neurological:     Mental Status: He is alert and oriented to person, place, and time.     Cranial Nerves: No cranial nerve deficit.     Deep Tendon Reflexes: Reflexes are normal and symmetric.  Psychiatric:        Behavior: Behavior normal.        Thought Content: Thought content normal.        Judgment: Judgment normal.       BP 125/68   Pulse 77   Temp (!) 97.3 F (36.3 C) (Temporal)   Wt 226 lb 9.6 oz (102.8 kg)   SpO2 94%   BMI 29.09 kg/m      Assessment & Plan:  OSHUA [name] comes in today with chief complaint of Insect Bite (On back left side )   Diagnosis and orders addressed:  1. Insect bite of lower back, initial encounter (Primary) Avoid scratching Keep clean and dry Start doxycyline  Report any fevers, increased redness - doxycycline  (VIBRA -TABS) 100 MG tablet; Take 1 tablet (100 mg total) by mouth 2 (two) times daily.  Dispense: 20 tablet; Refill: 0    Bari Learn, FNP

## 2023-09-12 ENCOUNTER — Other Ambulatory Visit: Payer: Self-pay | Admitting: Surgery

## 2023-10-15 ENCOUNTER — Ambulatory Visit: Payer: Self-pay

## 2023-10-15 NOTE — Telephone Encounter (Signed)
 Chief Complaint: Chest congestion, fatigue, wheezing Symptoms: see above Frequency: 11 days Pertinent Negatives: Patient denies earache, sore throat Disposition: [] ED /[] Urgent Care (no appt availability in office) / [x] Appointment(In office/virtual)/ []  [facility]/ [] Home Care/ [] Refused Recommended Disposition /[] [facility]/ []  Follow-up with PCP Additional Notes: Patient experiencing chest congestion, wheezing, cough with white phlegm, fatigue for 11 days. Patient states he has been taking OTC coricidin but no relief. Patient appt made for further evaluation.    Reason for Disposition  [1] Sinus congestion (pressure, fullness) AND [2] present > 10 days  Answer Assessment - Initial Assessment Questions 1. LOCATION: "Where does it hurt?"      No pain from sinus but chest congestion 2. ONSET: "When did the sinus pain start?"  (e.g., hours, days)      11 days ago 3. SEVERITY: "How bad is the pain?"   (Scale 1-10; mild, moderate or severe)   - MILD (1-3): doesn't interfere with normal activities    - MODERATE (4-7): interferes with normal activities (e.g., work or school) or awakens from sleep   - SEVERE (8-10): excruciating pain and patient unable to do any normal activities        N/a 4. RECURRENT SYMPTOM: "Have you ever had sinus problems before?" If Yes, ask: "When was the last time?" and "What happened that time?"      N/a 5. NASAL CONGESTION: "Is the nose blocked?" If Yes, ask: "Can you open it or must you breathe through your mouth?"     N/a 6. NASAL DISCHARGE: "Do you have discharge from your nose?" If so ask, "What color?"     White 7. FEVER: "Do you have a fever?" If Yes, ask: "What is it, how was it measured, and when did it start?"      Was having fever but not anymore 8. OTHER SYMPTOMS: "Do you have any other symptoms?" (e.g., sore throat, cough, earache, difficulty breathing)     Lethargy, cough, sore lymph nodes  Protocols used: Sinus Pain or  Congestion-A-AH

## 2023-10-16 ENCOUNTER — Ambulatory Visit (INDEPENDENT_AMBULATORY_CARE_PROVIDER_SITE_OTHER): Admitting: Family Medicine

## 2023-10-16 ENCOUNTER — Encounter: Payer: Self-pay | Admitting: Family Medicine

## 2023-10-16 VITALS — BP 114/59 | HR 80 | Temp 97.9°F | Ht 74.0 in | Wt 220.0 lb

## 2023-10-16 DIAGNOSIS — J40 Bronchitis, not specified as acute or chronic: Secondary | ICD-10-CM | POA: Diagnosis not present

## 2023-10-16 DIAGNOSIS — R6889 Other general symptoms and signs: Secondary | ICD-10-CM | POA: Diagnosis not present

## 2023-10-16 DIAGNOSIS — J329 Chronic sinusitis, unspecified: Secondary | ICD-10-CM

## 2023-10-16 MED ORDER — PROMETHAZINE-DM 6.25-15 MG/5ML PO SYRP
5.0000 mL | ORAL_SOLUTION | Freq: Four times a day (QID) | ORAL | 0 refills | Status: DC | PRN
Start: 2023-10-16 — End: 2023-12-31

## 2023-10-16 MED ORDER — LEVOFLOXACIN 500 MG PO TABS: 500.0000 mg | ORAL_TABLET | Freq: Every day | ORAL | 0 refills | Status: DC

## 2023-10-16 MED ORDER — CEFPROZIL 500 MG PO TABS
500.0000 mg | ORAL_TABLET | Freq: Two times a day (BID) | ORAL | 0 refills | Status: DC
Start: 2023-10-16 — End: 2023-12-31

## 2023-10-16 NOTE — Progress Notes (Signed)
 Chief Complaint  Patient presents with   sick    Started on 2 weeks ago. Pt is still experiencing congestion, cough that is occasionally productive and lethargic. Pt had a fever for 3 days but that subsided over a week ago. Taking tylenol  and mucinex .     HPI  Patient presents today for Patient presents with upper respiratory congestion.  There is moderate sore throat. Patient reports coughing frequently as well.  white sputum noted. There was fever to 100 at onset, no chills or sweats. The patient denies being short of breath. Onset was about 12 days ago. Gradually worsening. Tried OTCs without improvement.  PMH: Smoking status noted Review of Systems  Objective: BP (!) 114/59   Pulse 80   Temp 97.9 F (36.6 C)   Ht 6\' 2"  (1.88 m)   Wt 220 lb (99.8 kg)   SpO2 98%   BMI 28.25 kg/m  Gen: NAD, alert, cooperative with exam HEENT: NCAT, Nasal passages swollen, rpharynx has mild erythema.  CV: RRR, good S1/S2, no murmur Resp: Bronchitis changes with scattered wheezes, non-labored Ext: No edema, warm Neuro: Alert and oriented, No gross deficits  Sinobronchitis -     Promethazine -DM; Take 5 mLs by mouth 4 (four) times daily as needed for cough.  Dispense: 240 mL; Refill: 0 -     COVID-19, Flu A+B and RSV -     Cefprozil; Take 1 tablet (500 mg total) by mouth 2 (two) times daily. For infection. Take all of this medication.  Dispense: 20 tablet; Refill: 0   Follow up as needed should symptoms fail to resolve.

## 2023-10-17 ENCOUNTER — Encounter (HOSPITAL_COMMUNITY): Payer: Self-pay

## 2023-10-18 LAB — COVID-19, FLU A+B AND RSV
Influenza A, NAA: NOT DETECTED
Influenza B, NAA: NOT DETECTED
RSV, NAA: NOT DETECTED
SARS-CoV-2, NAA: NOT DETECTED

## 2023-10-21 ENCOUNTER — Encounter: Payer: Self-pay | Admitting: Family Medicine

## 2023-10-24 ENCOUNTER — Other Ambulatory Visit: Payer: Self-pay

## 2023-10-24 ENCOUNTER — Ambulatory Visit: Admitting: Family Medicine

## 2023-10-24 DIAGNOSIS — J301 Allergic rhinitis due to pollen: Secondary | ICD-10-CM

## 2023-10-24 DIAGNOSIS — E782 Mixed hyperlipidemia: Secondary | ICD-10-CM

## 2023-10-24 DIAGNOSIS — I480 Paroxysmal atrial fibrillation: Secondary | ICD-10-CM

## 2023-10-24 MED ORDER — APIXABAN 5 MG PO TABS: ORAL_TABLET | ORAL | 3 refills | Status: DC

## 2023-10-24 MED ORDER — DILTIAZEM HCL 60 MG PO TABS: 60.0000 mg | ORAL_TABLET | ORAL | 3 refills | Status: DC | PRN

## 2023-10-24 MED ORDER — NITROGLYCERIN 0.4 MG SL SUBL: 0.4000 mg | SUBLINGUAL_TABLET | SUBLINGUAL | 3 refills | Status: DC | PRN

## 2023-10-24 MED ORDER — DILTIAZEM HCL ER COATED BEADS 180 MG PO CP24: 180.0000 mg | ORAL_CAPSULE | Freq: Every day | ORAL | 3 refills | Status: DC

## 2023-10-24 MED ORDER — ATORVASTATIN CALCIUM 80 MG PO TABS: ORAL_TABLET | ORAL | 3 refills | Status: DC

## 2023-10-24 MED ORDER — FLUTICASONE PROPIONATE 50 MCG/ACT NA SUSP: 2.0000 | Freq: Every day | NASAL | 3 refills | Status: DC

## 2023-10-25 ENCOUNTER — Telehealth (HOSPITAL_COMMUNITY): Payer: Self-pay | Admitting: *Deleted

## 2023-10-25 NOTE — Telephone Encounter (Signed)
 Patient called in stating he is having an increased afib burden over the last several weeks. He had an episode last night that lasted about 13 hours requiring several doses of PRN cardizem  60mg .  Heart rates were up to 140s before PRN medications. Pt feeling back to baseline and according to his [name] has returned to normal rhythm this morning.  Pt for the last 2 weeks has been treated for a sinus infection and is completing his antibiotics today.  He was supposed to fly to [place] today but moved his flight to tomorrow since his extended episode of afib overnight. Educated pt on use of PRN cardizem  as well as infections trigger afib intermittently.  Pt felt better about flying to [place] after our conversation. Patient will take PRN cardizem  with him on his trip if afib burden continues to increase he will call when he returns to be seen for further discussion.

## 2023-11-06 ENCOUNTER — Ambulatory Visit: Payer: Medicare Other | Admitting: Adult Health

## 2023-11-09 ENCOUNTER — Ambulatory Visit: Payer: Medicare Other

## 2023-11-09 VITALS — Ht 74.0 in | Wt 215.0 lb

## 2023-11-09 DIAGNOSIS — Z0000 Encounter for general adult medical examination without abnormal findings: Secondary | ICD-10-CM

## 2023-11-09 NOTE — Patient Instructions (Signed)
 Jordan [name] , Thank you for taking time out of your busy schedule to complete your Annual Wellness Visit with me. I enjoyed our conversation and look forward to speaking with you again next year. I, as well as your care team,  appreciate your ongoing commitment to your health goals. Please review the following plan we discussed and let me know if I can assist you in the future. Your Game plan/ To Do List    Referrals: If you haven't heard from the office you've been referred to, please reach out to them at the phone number provided.  N/a Follow up Visits: Next Medicare AWV with our clinical staff:   November 10, 2024 at 10:00 am video visit Have you seen your provider in the last 6 months (3 months if uncontrolled diabetes)? Yes Next Office Visit with your provider:   December 07, 2023 at 10:35 in office  Clinician Recommendations:   Aim for 30 minutes of exercise or brisk walking, 6-8 glasses of water, and 5 servings of fruits and vegetables each day.  I enjoyed our conversation today and look forward to talking with you again next year!! Have a wonderful and safe year. All the best, Pio Eatherly This is a list of the screening recommended for you and due dates:  Health Maintenance  Topic Date Due   COVID-19 Vaccine (6 - 2024-25 season) 04/23/2023   Flu Shot  01/11/2024   Medicare Annual Wellness Visit  11/08/2024   Colon Cancer Screening  03/28/2025   DTaP/Tdap/Td vaccine (2 - Td or Tdap) 11/29/2026   Pneumonia Vaccine  Completed   Hepatitis C Screening  Completed   Zoster (Shingles) Vaccine  Completed   HPV Vaccine  Aged Out   Meningitis B Vaccine  Aged Out    Advanced directives: (Declined) Advance directive discussed with you today. Even though you declined this today, please call our office should you change your mind, and we can give you the proper paperwork for you to fill out. Advance Care Planning is important because it:  [x]  Makes sure you receive the medical care that is consistent with  your values, goals, and preferences  [x]  It provides guidance to your family and loved ones and reduces their decisional burden about whether or not they are making the right decisions based on your wishes.  Follow the link provided in your after visit summary or read over the paperwork we have mailed to you to help you started getting your Advance Directives in place. If you need assistance in completing these, please reach out to us  so that we can help you! See attachments for Preventive Care and Fall Prevention Tips.  Understanding Your Risk for Falls Millions of people have serious injuries from falls each year. It is important to understand your risk of falling. Talk with your health care provider about your risk and what you can do to lower it. If you do have a serious fall, make sure to tell your provider. Falling once raises your risk of falling again. How can falls affect me? Serious injuries from falls are common. These include: Broken bones, such as hip fractures. Head injuries, such as traumatic brain injuries (TBI) or concussions. A fear of falling can cause you to avoid activities and stay at home. This can make your muscles weaker and raise your risk for a fall. What can increase my risk? There are a number of risk factors that increase your risk for falling. The more risk factors you have, the higher  your risk of falling. Serious injuries from a fall happen most often to people who are older than 76 years old. Teenagers and young adults ages 89-29 are also at higher risk. Common risk factors include: Weakness in the lower body. Being generally weak or confused due to long-term (chronic) illness. Dizziness or balance problems. Poor vision. Medicines that cause dizziness or drowsiness. These may include: Medicines for your blood pressure, heart, anxiety, insomnia, or swelling (edema). Pain medicines. Muscle relaxants. Other risk factors include: Drinking alcohol. Having had a  fall in the past. Having foot pain or wearing improper footwear. Working at a dangerous job. Having any of the following in your home: Tripping hazards, such as floor clutter or loose rugs. Poor lighting. Pets. Having dementia or memory loss. What actions can I take to lower my risk of falling?     Physical activity Stay physically fit. Do strength and balance exercises. Consider taking a regular class to build strength and balance. Yoga and tai chi are good options. Vision Have your eyes checked every year and your prescription for glasses or contacts updated as needed. Shoes and walking aids Wear non-skid shoes. Wear shoes that have rubber soles and low heels. Do not wear high heels. Do not walk around the house in socks or slippers. Use a cane or walker as told by your provider. Home safety Attach secure railings on both sides of your stairs. Install grab bars for your bathtub, shower, and toilet. Use a non-skid mat in your bathtub or shower. Attach bath mats securely with double-sided, non-slip rug tape. Use good lighting in all rooms. Keep a flashlight near your bed. Make sure there is a clear path from your bed to the bathroom. Use night-lights. Do not use throw rugs. Make sure all carpeting is taped or tacked down securely. Remove all clutter from walkways and stairways, including extension cords. Repair uneven or broken steps and floors. Avoid walking on icy or slippery surfaces. Walk on the grass instead of on icy or slick sidewalks. Use ice melter to get rid of ice on walkways in the winter. Use a cordless phone. Questions to ask your health care provider Can you help me check my risk for a fall? Do any of my medicines make me more likely to fall? Should I take a vitamin D  supplement? What exercises can I do to improve my strength and balance? Should I make an appointment to have my vision checked? Do I need a bone density test to check for weak bones  (osteoporosis)? Would it help to use a cane or a walker? Where to find more information Centers for Disease Control and Prevention, [employer]: TonerPromos.no Community-Based Fall Prevention Programs: TonerPromos.no [employer] on Aging: BaseRingTones.pl Contact a health care provider if: You fall at home. You are afraid of falling at home. You feel weak, drowsy, or dizzy. This information is not intended to replace advice given to you by your health care provider. Make sure you discuss any questions you have with your health care provider. Document Revised: 01/30/2022 Document Reviewed: 01/30/2022 Elsevier Patient Education  2024 [employer].  Understanding Your Risk for Falls Millions of people have serious injuries from falls each year. It is important to understand your risk of falling. Talk with your health care provider about your risk and what you can do to lower it. If you do have a serious fall, make sure to tell your provider. Falling once raises your risk of falling again. How can falls affect me?  Serious injuries from falls are common. These include: Broken bones, such as hip fractures. Head injuries, such as traumatic brain injuries (TBI) or concussions. A fear of falling can cause you to avoid activities and stay at home. This can make your muscles weaker and raise your risk for a fall. What can increase my risk? There are a number of risk factors that increase your risk for falling. The more risk factors you have, the higher your risk of falling. Serious injuries from a fall happen most often to people who are older than 77 years old. Teenagers and young adults ages 75-29 are also at higher risk. Common risk factors include: Weakness in the lower body. Being generally weak or confused due to long-term (chronic) illness. Dizziness or balance problems. Poor vision. Medicines that cause dizziness or drowsiness. These may include: Medicines for your blood pressure, heart, anxiety, insomnia,  or swelling (edema). Pain medicines. Muscle relaxants. Other risk factors include: Drinking alcohol. Having had a fall in the past. Having foot pain or wearing improper footwear. Working at a dangerous job. Having any of the following in your home: Tripping hazards, such as floor clutter or loose rugs. Poor lighting. Pets. Having dementia or memory loss. What actions can I take to lower my risk of falling?     Physical activity Stay physically fit. Do strength and balance exercises. Consider taking a regular class to build strength and balance. Yoga and tai chi are good options. Vision Have your eyes checked every year and your prescription for glasses or contacts updated as needed. Shoes and walking aids Wear non-skid shoes. Wear shoes that have rubber soles and low heels. Do not wear high heels. Do not walk around the house in socks or slippers. Use a cane or walker as told by your provider. Home safety Attach secure railings on both sides of your stairs. Install grab bars for your bathtub, shower, and toilet. Use a non-skid mat in your bathtub or shower. Attach bath mats securely with double-sided, non-slip rug tape. Use good lighting in all rooms. Keep a flashlight near your bed. Make sure there is a clear path from your bed to the bathroom. Use night-lights. Do not use throw rugs. Make sure all carpeting is taped or tacked down securely. Remove all clutter from walkways and stairways, including extension cords. Repair uneven or broken steps and floors. Avoid walking on icy or slippery surfaces. Walk on the grass instead of on icy or slick sidewalks. Use ice melter to get rid of ice on walkways in the winter. Use a cordless phone. Questions to ask your health care provider Can you help me check my risk for a fall? Do any of my medicines make me more likely to fall? Should I take a vitamin D  supplement? What exercises can I do to improve my strength and balance? Should I  make an appointment to have my vision checked? Do I need a bone density test to check for weak bones (osteoporosis)? Would it help to use a cane or a walker? Where to find more information Centers for Disease Control and Prevention, [employer]: TonerPromos.no Community-Based Fall Prevention Programs: TonerPromos.no [employer] on Aging: BaseRingT[employer].pl Contact a health care provider if: You fall at home. You are afraid of falling at home. You feel weak, drowsy, or dizzy. This information is not intended to replace advice given to you by your health care provider. Make sure you discuss any questions you have with your health care provider. Document Revised: 01/30/2022 Document  Reviewed: 01/30/2022 Elsevier Patient Education  2024 [employer].

## 2023-11-09 NOTE — Progress Notes (Signed)
 Subjective:   Jordan [name] is a 78 y.o. who presents for a Medicare Wellness preventive visit.  As a reminder, Annual Wellness Visits don't include a physical exam, and some assessments may be limited, especially if this visit is performed virtually. We may recommend an in-person follow-up visit with your provider if needed.  Visit Complete: Virtual I connected with  Jordan [name] on 11/09/23 by a audio enabled telemedicine application and verified that I am speaking with the correct person using two identifiers.  Patient Location: Home  Provider Location: Home Office  I discussed the limitations of evaluation and management by telemedicine. The patient expressed understanding and agreed to proceed.  Vital Signs: Because this visit was a virtual/telehealth visit, some criteria may be missing or patient reported. Any vitals not documented were not able to be obtained and vitals that have been documented are patient reported.  VideoDeclined- This patient declined Librarian, academic. Therefore the visit was completed with audio only.  Persons Participating in Visit: Patient.  AWV Questionnaire: Yes: Patient Medicare AWV questionnaire was completed by the patient on 11/05/2023; I have confirmed that all information answered by patient is correct and no changes since this date.  Cardiac Risk Factors include: advanced age (>61men, >21 women);dyslipidemia;hypertension;male gender;Other (see comment), Risk factor comments: AFib, CAD, OSA     Objective:     Today's Vitals   11/09/23 0801  Weight: 215 lb (97.5 kg)  Height: 6\' 2"  (1.88 m)   Body mass index is 27.6 kg/m.     11/09/2023    8:09 AM 12/27/2022   10:40 AM 11/23/2022    2:47 PM 09/05/2022   10:30 AM 11/21/2021    2:50 PM 10/25/2021    8:54 AM 11/18/2020    2:59 PM  Advanced Directives  Does Patient Have a Medical Advance Directive? No Yes Yes Yes Yes Yes Yes  Type of Theme park manager;Living will Healthcare Power of [place];Living will Healthcare Power of [place];Living will Healthcare Power of [employer] of [place];Living will  Does patient want to make changes to medical advance directive?    No - Patient declined  No - Patient declined   Copy of Healthcare Power of Attorney in Chart?   No - copy requested No - copy requested No - copy requested  No - copy requested  Would patient like information on creating a medical advance directive? No - Patient declined          Current Medications (verified) Outpatient Encounter Medications as of 11/09/2023  Medication Sig   apixaban  (ELIQUIS ) 5 MG TABS tablet TAKE  (1)  TABLET TWICE A DAY.   atorvastatin  (LIPITOR) 80 MG tablet TAKE ONE TABLET ONCE DAILY AT 6PM   Calcium  Carb-Cholecalciferol  (CALCIUM  600+D3 PO) Take 1 capsule by mouth daily.   carboxymethylcellulose (REFRESH PLUS) 0.5 % SOLN Place 1 drop into both eyes 3 (three) times daily as needed (dry eyes).   cefPROZIL  (CEFZIL ) 500 MG tablet Take 1 tablet (500 mg total) by mouth 2 (two) times daily. For infection. Take all of this medication.   Cholecalciferol  (VITAMIN D3) 1000 UNITS CAPS Take 1,000 Units by mouth daily.   diltiazem  (CARDIZEM  CD) 180 MG 24 hr capsule Take 1 capsule (180 mg total) by mouth daily.   diltiazem  (CARDIZEM ) 60 MG tablet Take 1 tablet (60 mg total) by mouth as needed.   fluticasone  (FLONASE ) 50 MCG/ACT nasal spray Place 2 sprays into both nostrils  daily.   GLUCOSAMINE-CHONDROITIN PO Take 1,500 mg by mouth daily.   mirabegron ER (MYRBETRIQ) 50 MG TB24 tablet Take 50 mg by mouth daily.   Multiple Vitamins-Minerals (MULTIVITAMIN WITH MINERALS) tablet Take 1 tablet by mouth daily.   nitroGLYCERIN  (NITROSTAT ) 0.4 MG SL tablet Place 1 tablet (0.4 mg total) under the tongue every 5 (five) minutes as needed for chest pain.   NON FORMULARY Pt uses a cpap nightly   OVER THE COUNTER MEDICATION Take 1 tablet by mouth  daily. Nerve Health OTC supplement   promethazine -dextromethorphan (PROMETHAZINE -DM) 6.25-15 MG/5ML syrup Take 5 mLs by mouth 4 (four) times daily as needed for cough.   No facility-administered encounter medications on file as of 11/09/2023.    Allergies (verified) Levofloxacin , Lopid [gemfibrozil], Sulfonamide derivatives, and Sulfa antibiotics   History: Past Medical History:  Diagnosis Date   Anxiety    Mild   Atrial fibrillation (HCC)    Paroxysmal   BPH (benign prostatic hyperplasia)    CAD (coronary artery disease)    Stent LAD, 2002  /  nuclear 2009, no ischemia  /    Hospital October, 2011 nuclear, no ischemia, possible slight apical scar, ejection fraction 70%   Cancer (HCC)    prostate CA-non aggressive   Carotid bruit    Doppler November, 2011, normal   Chest pain 02/23/2013   Chronic anticoagulation    Low CHADS , score, but patient wants to be aggressive   Colon polyps    COVID-19    + long Covid problems   Drug intolerance    Mild beta blocker intolerance   Dyslipidemia    Ejection fraction    EF 65-70%, echo, 2007 /  EF 70%, nuclear, 2011   History of meniscal tear 10/18/2021   Hyperlipidemia    IBS (irritable bowel syndrome)    Incomplete RBBB    Knee pain    left   Palpitations    Personal history of colonic adenomas 11/29/2006   Qualifier: Diagnosis of  By: Sharlette Dayhoff CMA, [place]     Sleep apnea    Syncope    November, 2010   Traumatic tear of lateral meniscus of right knee 03/26/2011   Past Surgical History:  Procedure Laterality Date   ATRIAL FIBRILLATION ABLATION N/A 10/25/2021   Procedure: ATRIAL FIBRILLATION ABLATION;  Surgeon: Lei Pump, MD;  Location: [facility];  Service: Cardiovascular;  Laterality: N/A;   BACK SURGERY  05/1990   [id number]   CARDIAC CATHETERIZATION     COLONOSCOPY  multiple   IR EMBO VENOUS NOT HEMORR HEMANG  INC GUIDE ROADMAPPING  02/08/2023   IR RADIOLOGIST EVAL & MGMT  01/08/2023   IR RADIOLOGIST EVAL &  MGMT  02/16/2023   LUMBAR LAMINECTOMY/ DECOMPRESSION WITH MET-RX Left 09/13/2022   Procedure: Minimally Invasive  Laminectomy, Medical Facetectomy ,Microdiscectomy Left Lumbar Four-Five;  Surgeon: Van Gelinas, MD;  Location: [facility] OR;  Service: Neurosurgery;  Laterality: Left;  3C   PROSTATE SURGERY     UMBILICAL HERNIA REPAIR  07/15/2021   [facility]   XI ROBOTIC ASSISTED SIMPLE PROSTATECTOMY  2022   Family History  Problem Relation Age of Onset   Heart attack Mother    Diabetes Mother    Macular degeneration Sister    Obesity Sister    Paranoid behavior Sister    Dementia Sister    Cancer Sister        lung - uterus    COPD Sister    Heart attack Brother  Heart disease Brother        cause of death heart attack   Cancer Daughter        colon    Stroke Maternal Grandfather    Stroke Paternal Grandfather    Colon cancer Neg Hx    Social History   Socioeconomic History   Marital status: Married    Spouse name: Adah Acron    Number of children: 3   Years of education: bachelors   Highest education level: Bachelor's degree (e.g., BA, AB, BS)  Occupational History   Occupation: retired    Comment: Advice worker Lab  Tobacco Use   Smoking status: Never   Smokeless tobacco: Never   Tobacco comments:    Never smoke 07/27/22  Vaping Use   Vaping status: Never Used  Substance and Sexual Activity   Alcohol use: No   Drug use: No   Sexual activity: Yes  Other Topics Concern   Not on file  Social History Narrative   Married to [place], he has children and grandchildren.   [place], raises chickens, retired Copywriter, advertising    Financially cares for his disabled nephew and recently started caring for his disabled sister who resides in a nursing facility in [place]   Social Drivers of [employer]: Low Risk  (11/09/2023)   Overall Financial Resource Strain (CARDIA)    Difficulty of Paying Living Expenses: Not hard at all  Food  Insecurity: No Food Insecurity (11/09/2023)   Hunger Vital Sign    Worried About Running Out of Food in the Last Year: Never true    Ran Out of Food in the Last Year: Never true  Transportation Needs: No Transportation Needs (11/09/2023)   PRAPARE - Administrator, Civil Service (Medical): No    Lack of Transportation (Non-Medical): No  Physical Activity: Sufficiently Active (11/09/2023)   Exercise Vital Sign    Days of Exercise per Week: 7 days    Minutes of Exercise per Session: 30 min  Stress: No Stress Concern Present (11/09/2023)   [employer] of Occupational Health - Occupational Stress Questionnaire    Feeling of Stress : Not at all  Social Connections: Socially Integrated (11/09/2023)   Social Connection and Isolation Panel [NHANES]    Frequency of Communication with Friends and Family: More than three times a week    Frequency of Social Gatherings with Friends and Family: More than three times a week    Attends Religious Services: More than 4 times per year    Active Member of [employer] or Organizations: Yes    Attends Engineer, structural: More than 4 times per year    Marital Status: Married    Tobacco Counseling Counseling given: Yes Tobacco comments: Never smoke 07/27/22    Clinical Intake:  Pre-visit preparation completed: Yes  Pain : No/denies pain     BMI - recorded: 27.6 Nutritional Status: BMI 25 -29 Overweight Nutritional Risks: None Diabetes: No  Lab Results  Component Value Date   HGBA1C 5.6 01/19/2021   HGBA1C 5.3 12/22/2019     How often do you need to have someone help you when you read instructions, pamphlets, or other written materials from your doctor or pharmacy?: 1 - Never  Interpreter Needed?: No  Information entered by :: Sally Crazier CMA   Activities of Daily Living     11/05/2023    5:10 PM 11/23/2022    2:48 PM  In your  present state of health, do you have any difficulty performing the following activities:   Hearing? 0 0  Vision? 0 0  Difficulty concentrating or making decisions? 0 0  Walking or climbing stairs? 0 0  Dressing or bathing? 0 0  Doing errands, shopping? 0 0  Preparing Food and eating ? N N  Using the Toilet? N N  In the past six months, have you accidently leaked urine? Y N  Do you have problems with loss of bowel control? N N  Managing your Medications? N N  Managing your Finances? N N  Housekeeping or managing your Housekeeping? N N    Patient Care Team: Eliodoro Guerin, DO as PCP - General (Family Medicine) Hugh Madura, MD as PCP - Cardiology (Cardiology) Lei Pump, MD as PCP - Electrophysiology (Cardiology) Kenney Peacemaker, MD (Gastroenterology) Harlen Lick, MD as Consulting Physician (Dermatology) Dohmeier, Raoul Byes, MD as Consulting Physician (Neurology) Hyland Mailman, MD as Referring Physician (Optometry) Charol Copas, Polly Brink, MD as Consulting Physician (Orthopedic Surgery) Tyrus Gallus, MD as Referring Physician (Urology) Suttle, Antonia Battiest, MD as Consulting Physician (Interventional Radiology)  Indicate any recent Medical Services you may have received from other than [facility] providers in the past year (date may be approximate).     Assessment:    This is a routine wellness examination for Jordan [name].  Hearing/Vision screen Hearing Screening - Comments:: Patient denies any hearing difficulties.   Vision Screening - Comments:: Wears rx glasses - up to date with routine eye exams  Patient sees Dr[facility]Le at [facility]     Goals Addressed             This Visit's Progress    Patient Stated       Continue playing pickle ball       Depression Screen     11/09/2023    8:06 AM 10/16/2023    8:19 AM 05/02/2023    9:22 AM 12/27/2022    2:13 PM 12/11/2022    8:51 AM 11/23/2022    2:47 PM 10/18/2022    9:03 AM  PHQ 2/9 Scores  PHQ - 2 Score 0 0 0 0 0 0 0  PHQ- 9 Score 0 0 0  0 0 0    Fall Risk     11/05/2023    5:10 PM 05/02/2023    9:22 AM  05/02/2023    9:21 AM 12/27/2022    2:13 PM 12/11/2022    8:51 AM  Fall Risk   Falls in the past year? 0 0 0 0 0  Number falls in past yr: 0 0 0    Injury with Fall? 0 0 0    Risk for fall due to : No Fall Risks  No Fall Risks    Follow up Falls evaluation completed  Education provided      MEDICARE RISK AT HOME:  Medicare Risk at Home Any stairs in or around the home?: (Patient-Rptd) No If so, are there any without handrails?: (Patient-Rptd) No Home free of loose throw rugs in walkways, pet beds, electrical cords, etc?: (Patient-Rptd) No Adequate lighting in your home to reduce risk of falls?: (Patient-Rptd) Yes Life alert?: (Patient-Rptd) No Use of a cane, walker or w/c?: (Patient-Rptd) No Grab bars in the bathroom?: (Patient-Rptd) Yes Shower chair or bench in shower?: (Patient-Rptd) Yes Elevated toilet seat or a handicapped toilet?: (Patient-Rptd) No  TIMED UP AND GO:  Was the test performed?  No  Cognitive Function: 6CIT completed  11/09/2023    8:15 AM 11/23/2022    2:48 PM 11/21/2021    2:52 PM 11/18/2019    2:51 PM 11/18/2019    2:39 PM  6CIT Screen  What Year? 0 points 0 points 0 points 0 points 0 points  What month? 0 points 0 points 0 points 0 points 0 points  What time? 0 points 0 points 0 points 0 points   Count back from 20 0 points 0 points 0 points 0 points   Months in reverse 0 points 0 points 0 points 0 points   Repeat phrase 0 points 0 points 0 points 0 points   Total Score 0 points 0 points 0 points 0 points     Immunizations Immunization History  Administered Date(s) Administered   Influenza,inj,Quad PF,6+ Mos 02/24/2013, 03/28/2014, 03/26/2015, 03/10/2017, 03/26/2018   Influenza-Unspecified 03/25/2016, 03/26/2018, 03/13/2019, 02/26/2023   Moderna SARS-COV2 Booster Vaccination 02/26/2023   PFIZER(Purple Top)SARS-COV-2 Vaccination 07/08/2019, 07/29/2019, 03/11/2020, 09/17/2020   Pfizer(Comirnaty)Fall Seasonal Vaccine 12 years and older 04/11/2022    Pneumococcal Conjugate-13 11/07/2013   Pneumococcal Polysaccharide-23 10/16/2012   Tdap 11/28/2016   Zoster Recombinant(Shingrix) 10/11/2017, 12/10/2017    Screening Tests Health Maintenance  Topic Date Due   COVID-19 Vaccine (6 - 2024-25 season) 04/23/2023   INFLUENZA VACCINE  01/11/2024   Medicare Annual Wellness (AWV)  11/08/2024   Colonoscopy  03/28/2025   DTaP/Tdap/Td (2 - Td or Tdap) 11/29/2026   Pneumonia Vaccine 81+ Years old  Completed   Hepatitis C Screening  Completed   Zoster Vaccines- Shingrix  Completed   HPV VACCINES  Aged Out   Meningococcal B Vaccine  Aged Out    Health Maintenance  Health Maintenance Due  Topic Date Due   COVID-19 Vaccine (6 - 2024-25 season) 04/23/2023   Health Maintenance Items Addressed: Health Maintenance is up to date.   Additional Screening:  Vision Screening: Recommended annual ophthalmology exams for early detection of glaucoma and other disorders of the eye.  Dental Screening: Recommended annual dental exams for proper oral hygiene  Community Resource Referral / Chronic Care Management: CRR required this visit?  No   CCM required this visit?  No   Plan:    I have personally reviewed and noted the following in the patient's chart:   Medical and social history Use of alcohol, tobacco or illicit drugs  Current medications and supplements including opioid prescriptions. Patient is not currently taking opioid prescriptions. Functional ability and status Nutritional status Physical activity Advanced directives List of other physicians Hospitalizations, surgeries, and ER visits in previous 12 months Vitals Screenings to include cognitive, depression, and falls Referrals and appointments  In addition, I have reviewed and discussed with patient certain preventive protocols, quality metrics, and best practice recommendations. A written personalized care plan for preventive services as well as general preventive health  recommendations were provided to patient.   Quill Grinder, CMA   11/09/2023   After Visit Summary: (MyChart) Due to this being a telephonic visit, the after visit summary with patients personalized plan was offered to patient via MyChart   Notes: Nothing significant to report at this time.

## 2023-11-19 NOTE — Progress Notes (Unsigned)
 [facility] [address] [place]. [place] [address] [phone]       OFFICE FOLLOW UP NOTE  Mr. Jordan [name] Date of Birth:  1946/01/18 Medical Record Number:  [id number]    Primary neurologist: Dr. Albertina Hugger Reason for visit:CPAP follow-up    SUBJECTIVE:   CHIEF COMPLAINT:  No chief complaint on file.   Follow-up visit:  Prior visit: 04/25/2023  Brief HPI:   Jordan [name] is a 78 y.o. male diagnosed with sleep apnea in 2021 after AHI in 02/2020 showed very mild sleep apnea with AHI 5.5/h, NREM AHI 3.7/h and REM AHI of 11.7/h.  Given degree of sleepiness with ESS 18/24 and other comorbidities, recommended initiation of AutoPap. autoCPAP set up 09/09/2020.  Pressure setting adjusted in 10/2022 from 5-12 to 5-15. Pressure setting again adjusted in 04/2023 from 5-15 to 5-16.      Interval history:  Patient notes improvement of apneas with increased pressure, tolerating pressure well.  He has also noticed lower AHI laying on his side or head of bed slightly elevated.  Reports continued benefit with CPAP use.  ESS 3/24.  He tries to stay active routinely playing pickle ball and riding his bike.       ROS:   14 system review of systems performed and negative with exception of those listed in HPI  PMH:  Past Medical History:  Diagnosis Date   Anxiety    Mild   Atrial fibrillation (HCC)    Paroxysmal   BPH (benign prostatic hyperplasia)    CAD (coronary artery disease)    Stent LAD, 2002  /  nuclear 2009, no ischemia  /    Hospital October, 2011 nuclear, no ischemia, possible slight apical scar, ejection fraction 70%   Cancer (HCC)    prostate CA-non aggressive   Carotid bruit    Doppler November, 2011, normal   Chest pain 02/23/2013   Chronic anticoagulation    Low CHADS , score, but patient wants to be aggressive   Colon polyps    COVID-19    + long Covid problems   Drug intolerance    Mild beta blocker intolerance   Dyslipidemia     Ejection fraction    EF 65-70%, echo, 2007 /  EF 70%, nuclear, 2011   History of meniscal tear 10/18/2021   Hyperlipidemia    IBS (irritable bowel syndrome)    Incomplete RBBB    Knee pain    left   Palpitations    Personal history of colonic adenomas 11/29/2006   Qualifier: Diagnosis of  By: Sharlette Dayhoff CMA, [place]     Sleep apnea    Syncope    November, 2010   Traumatic tear of lateral meniscus of right knee 03/26/2011    PSH:  Past Surgical History:  Procedure Laterality Date   ATRIAL FIBRILLATION ABLATION N/A 10/25/2021   Procedure: ATRIAL FIBRILLATION ABLATION;  Surgeon: Lei Pump, MD;  Location: [facility];  Service: Cardiovascular;  Laterality: N/A;   BACK SURGERY  05/1990   [id number]   CARDIAC CATHETERIZATION     COLONOSCOPY  multiple   IR EMBO VENOUS NOT HEMORR HEMANG  INC GUIDE ROADMAPPING  02/08/2023   IR RADIOLOGIST EVAL & MGMT  01/08/2023   IR RADIOLOGIST EVAL & MGMT  02/16/2023   LUMBAR LAMINECTOMY/ DECOMPRESSION WITH MET-RX Left 09/13/2022   Procedure: Minimally Invasive  Laminectomy, Medical Facetectomy ,Microdiscectomy Left Lumbar Four-Five;  Surgeon: Van Gelinas, MD;  Location: [facility] OR;  Service: Neurosurgery;  Laterality: Left;  3C   PROSTATE SURGERY     UMBILICAL HERNIA REPAIR  07/15/2021   [facility]   XI ROBOTIC ASSISTED SIMPLE PROSTATECTOMY  2022    Social History:  Social History   Socioeconomic History   Marital status: Married    Spouse name: Adah Acron    Number of children: 3   Years of education: bachelors   Highest education level: Bachelor's degree (e.g., BA, AB, BS)  Occupational History   Occupation: retired    Comment: Advice worker [employer]  Tobacco Use   Smoking status: Never   Smokeless tobacco: Never   Tobacco comments:    Never smoke 07/27/22  Vaping Use   Vaping status: Never Used  Substance and Sexual Activity   Alcohol use: No   Drug use: No   Sexual activity: Yes  Other Topics Concern   Not on  file  Social History Narrative   Married to [place], he has children and grandchildren.   [place], raises chickens, retired Copywriter, advertising    Financially cares for his disabled nephew and recently started caring for his disabled sister who resides in a nursing facility in [place]   Social Drivers of [employer]: Low Risk  (11/09/2023)   Overall Financial Resource Strain (CARDIA)    Difficulty of Paying Living Expenses: Not hard at all  Food Insecurity: No Food Insecurity (11/09/2023)   Hunger Vital Sign    Worried About Running Out of Food in the Last Year: Never true    Ran Out of Food in the Last Year: Never true  Transportation Needs: No Transportation Needs (11/09/2023)   PRAPARE - Administrator, Civil Service (Medical): No    Lack of Transportation (Non-Medical): No  Physical Activity: Sufficiently Active (11/09/2023)   Exercise Vital Sign    Days of Exercise per Week: 7 days    Minutes of Exercise per Session: 30 min  Stress: No Stress Concern Present (11/09/2023)   [employer] of Occupational Health - Occupational Stress Questionnaire    Feeling of Stress : Not at all  Social Connections: Socially Integrated (11/09/2023)   Social Connection and Isolation Panel [NHANES]    Frequency of Communication with Friends and Family: More than three times a week    Frequency of Social Gatherings with Friends and Family: More than three times a week    Attends Religious Services: More than 4 times per year    Active Member of [employer] or Organizations: Yes    Attends Engineer, structural: More than 4 times per year    Marital Status: Married  Catering manager Violence: Not At Risk (11/09/2023)   Humiliation, Afraid, Rape, and Kick questionnaire    Fear of Current or Ex-Partner: No    Emotionally Abused: No    Physically Abused: No    Sexually Abused: No    Family History:  Family History  Problem Relation Age of Onset   Heart  attack Mother    Diabetes Mother    Macular degeneration Sister    Obesity Sister    Paranoid behavior Sister    Dementia Sister    Cancer Sister        lung - uterus    COPD Sister    Heart attack Brother    Heart disease Brother        cause of death heart attack   Cancer Daughter        colon  Stroke Maternal Grandfather    Stroke Paternal Grandfather    Colon cancer Neg Hx     Medications:   Current Outpatient Medications on File Prior to Visit  Medication Sig Dispense Refill   apixaban  (ELIQUIS ) 5 MG TABS tablet TAKE  (1)  TABLET TWICE A DAY. 180 tablet 3   atorvastatin  (LIPITOR) 80 MG tablet TAKE ONE TABLET ONCE DAILY AT 6PM 90 tablet 3   Calcium  Carb-Cholecalciferol  (CALCIUM  600+D3 PO) Take 1 capsule by mouth daily.     carboxymethylcellulose (REFRESH PLUS) 0.5 % SOLN Place 1 drop into both eyes 3 (three) times daily as needed (dry eyes).     cefPROZIL  (CEFZIL ) 500 MG tablet Take 1 tablet (500 mg total) by mouth 2 (two) times daily. For infection. Take all of this medication. 20 tablet 0   Cholecalciferol  (VITAMIN D3) 1000 UNITS CAPS Take 1,000 Units by mouth daily.     diltiazem  (CARDIZEM  CD) 180 MG 24 hr capsule Take 1 capsule (180 mg total) by mouth daily. 90 capsule 3   diltiazem  (CARDIZEM ) 60 MG tablet Take 1 tablet (60 mg total) by mouth as needed. 90 tablet 3   fluticasone  (FLONASE ) 50 MCG/ACT nasal spray Place 2 sprays into both nostrils daily. 48 g 3   GLUCOSAMINE-CHONDROITIN PO Take 1,500 mg by mouth daily.     mirabegron ER (MYRBETRIQ) 50 MG TB24 tablet Take 50 mg by mouth daily.     Multiple Vitamins-Minerals (MULTIVITAMIN WITH MINERALS) tablet Take 1 tablet by mouth daily.     nitroGLYCERIN  (NITROSTAT ) 0.4 MG SL tablet Place 1 tablet (0.4 mg total) under the tongue every 5 (five) minutes as needed for chest pain. 25 tablet 3   NON FORMULARY Pt uses a cpap nightly     OVER THE COUNTER MEDICATION Take 1 tablet by mouth daily. Nerve Health OTC supplement      promethazine -dextromethorphan (PROMETHAZINE -DM) 6.25-15 MG/5ML syrup Take 5 mLs by mouth 4 (four) times daily as needed for cough. 240 mL 0   No current facility-administered medications on file prior to visit.    Allergies:   Allergies  Allergen Reactions   Levofloxacin  Other (See Comments)    Couldn't breathe, passed out   Lopid [Gemfibrozil] Other (See Comments)    Loose bowels   Sulfonamide Derivatives Hives   Sulfa Antibiotics Rash      OBJECTIVE:  Physical Exam  There were no vitals filed for this visit.  There is no height or weight on file to calculate BMI. No results found.   General: well developed, well nourished, very pleasant elderly Caucasian male, seated, in no evident distress  Neurologic Exam Mental Status: Awake and fully alert. Oriented to place and time. Recent and remote memory intact. Attention span, concentration and fund of knowledge appropriate. Mood and affect appropriate.  Cranial Nerves: Pupils equal, briskly reactive to light. Extraocular movements full without nystagmus. Visual fields full to confrontation. Hearing intact. Facial sensation intact. Face, tongue, palate moves normally and symmetrically.  Motor: Normal bulk and tone. Normal strength in all tested extremity muscles Gait and Station: Arises from chair without difficulty. Stance is normal. Gait demonstrates normal stride length and balance without use of AD. Tandem walk and heel toe without difficulty.  Reflexes: 1+ and symmetric. Toes downgoing.         ASSESSMENT/PLAN: JAYLEON MCFARLANE is a 78 y.o. year old male    OSA on CPAP : Residual AHI still very slightly elevated at 5.5 on pressure setting of 5-16, pressure  in the 95th percentile 15.3 with maximum pressure of 15.8.  Will adjust max pressure to 18.  Will repeat download in 4 to 6 weeks for reevaluation.  Discussed continued nightly usage with ensuring greater than 4 hours nightly for optimal benefit and per insurance  purposes.  Continue to follow with DME company for any needed supplies or CPAP related concerns     Follow up in 6 months or call earlier if needed   CC:  PCP: Eliodoro Guerin, DO    I personally spent a total of *** minutes in the care of the patient today including {Time Based Coding:210964241}.   Johny Nap, AGNP-BC  [facility] [address] [place], [place] [address]  Phone [phone] Fax [phone] Note: This document was prepared with digital dictation and possible smart phrase technology. Any transcriptional errors that result from this process are unintentional.

## 2023-11-20 ENCOUNTER — Ambulatory Visit: Admitting: Adult Health

## 2023-11-20 ENCOUNTER — Encounter: Payer: Self-pay | Admitting: Adult Health

## 2023-11-20 VITALS — BP 114/64 | HR 67 | Ht 74.0 in | Wt 223.4 lb

## 2023-11-20 DIAGNOSIS — G4733 Obstructive sleep apnea (adult) (pediatric): Secondary | ICD-10-CM

## 2023-11-20 NOTE — Patient Instructions (Signed)
 Your Plan:  Continue nightly use of CPAP with greater than 4 hours per night for optimal benefit and per insurance requirements  Continue to follow with your DME company for any needs supplies or CPAP related concerns.  Please be sure to change your filter every month, your mask about every 3 months, hose about every 6 months, humidifier chamber about yearly. Some restrictions are imposed by your insurance carrier with regard to how frequently you can get certain supplies. Your DME company can provide further details if necessary.      Follow-up in 1 year or call earlier if needed     Thank you for coming to see us  at [facility]. I hope we have been able to provide you high quality care today.  You may receive a patient satisfaction survey over the next few weeks. We would appreciate your feedback and comments so that we may continue to improve ourselves and the health of our patients.

## 2023-11-26 DIAGNOSIS — M25551 Pain in right hip: Secondary | ICD-10-CM | POA: Diagnosis not present

## 2023-12-07 ENCOUNTER — Ambulatory Visit (INDEPENDENT_AMBULATORY_CARE_PROVIDER_SITE_OTHER): Admitting: Family Medicine

## 2023-12-07 ENCOUNTER — Encounter: Payer: Self-pay | Admitting: Family Medicine

## 2023-12-07 VITALS — BP 102/66 | HR 60 | Temp 98.2°F | Ht 74.0 in | Wt 219.0 lb

## 2023-12-07 DIAGNOSIS — D6869 Other thrombophilia: Secondary | ICD-10-CM

## 2023-12-07 DIAGNOSIS — E782 Mixed hyperlipidemia: Secondary | ICD-10-CM | POA: Diagnosis not present

## 2023-12-07 DIAGNOSIS — N3281 Overactive bladder: Secondary | ICD-10-CM

## 2023-12-07 DIAGNOSIS — Z8546 Personal history of malignant neoplasm of prostate: Secondary | ICD-10-CM | POA: Diagnosis not present

## 2023-12-07 DIAGNOSIS — I480 Paroxysmal atrial fibrillation: Secondary | ICD-10-CM

## 2023-12-07 DIAGNOSIS — M25551 Pain in right hip: Secondary | ICD-10-CM

## 2023-12-07 LAB — LIPID PANEL

## 2023-12-07 MED ORDER — MIRABEGRON ER 25 MG PO TB24: 25.0000 mg | ORAL_TABLET | Freq: Every day | ORAL | 3 refills | Status: AC

## 2023-12-07 NOTE — Progress Notes (Signed)
 Subjective: CC: Atrial fibrillation PCP: Jordan Norene HERO, Jordan [name] Jordan [name] is a 78 y.o. male presenting to clinic today for:  1.  Atrial fibrillation Compliant with medications.  Reports no tremor or heart palpitations today.  2.  Hip pain Patient has been really having some difficulty in his a right hip.  He has seen [facility] had x-rays of both the low back and hip.  He was placed on prednisone x 5 days and a muscle relaxant but that really did not help.  He has been referred to physical therapy and has his first consultation on 8 July and will be seeing the sports medicine doctor on 14 July.  He was diagnosed with IT band syndrome and notes that he has had massage therapy which did help some but he notes he has ongoing pain with ambulation.  He is able to squat and pivot and Jordan [name] all his normal physical activities except for walk normally.  He reports no sensory changes.  3.  History of prostate cancer with simple prostatectomy/overactive bladder He wants to switch his urologist from [facility], who performed his surgery, back to [facility].  He was under the care of Dr. Watt for years.  He notes that he was placed on Myrbetriq 50 mg by his new urologist and after just a couple of days he started developing a headache and feeling poorly so he discontinued it.  He does note that it worked well though for his bladder.  No dysuria or hematuria.  2 episodes of nocturia typically per night.  He does note that if he does not wake up he will leak   ROS: Per HPI  Allergies  Allergen Reactions   Levofloxacin  Other (See Comments)    Couldn't breathe, passed out   Lopid [Gemfibrozil] Other (See Comments)    Loose bowels   Sulfonamide Derivatives Hives   Sulfa Antibiotics Rash   Past Medical History:  Diagnosis Date   Anxiety    Mild   Atrial fibrillation (HCC)    Paroxysmal   BPH (benign prostatic hyperplasia)    CAD (coronary artery disease)    Stent  LAD, 2002  /  nuclear 2009, no ischemia  /    Hospital October, 2011 nuclear, no ischemia, possible slight apical scar, ejection fraction 70%   Cancer (HCC)    prostate CA-non aggressive   Carotid bruit    Doppler November, 2011, normal   Chest pain 02/23/2013   Chronic anticoagulation    Low CHADS , score, but patient wants to be aggressive   Colon polyps    COVID-19    + long Covid problems   Drug intolerance    Mild beta blocker intolerance   Dyslipidemia    Ejection fraction    EF 65-70%, echo, 2007 /  EF 70%, nuclear, 2011   History of meniscal tear 10/18/2021   Hyperlipidemia    IBS (irritable bowel syndrome)    Incomplete RBBB    Knee pain    left   Palpitations    Personal history of colonic adenomas 11/29/2006   Qualifier: Diagnosis of  By: Nickola CMA, [place]     Sleep apnea    Syncope    November, 2010   Traumatic tear of lateral meniscus of right knee 03/26/2011    Current Outpatient Medications:    apixaban  (ELIQUIS ) 5 MG TABS tablet, TAKE  (1)  TABLET TWICE A DAY., Disp: 180 tablet, Rfl: 3   atorvastatin  (LIPITOR) 80  MG tablet, TAKE ONE TABLET ONCE DAILY AT 6PM, Disp: 90 tablet, Rfl: 3   Calcium  Carb-Cholecalciferol  (CALCIUM  600+D3 PO), Take 1 capsule by mouth daily., Disp: , Rfl:    carboxymethylcellulose (REFRESH PLUS) 0.5 % SOLN, Place 1 drop into both eyes 3 (three) times daily as needed (dry eyes)., Disp: , Rfl:    cefPROZIL  (CEFZIL ) 500 MG tablet, Take 1 tablet (500 mg total) by mouth 2 (two) times daily. For infection. Take all of this medication., Disp: 20 tablet, Rfl: 0   Cholecalciferol  (VITAMIN D3) 1000 UNITS CAPS, Take 1,000 Units by mouth daily., Disp: , Rfl:    diltiazem  (CARDIZEM  CD) 180 MG 24 hr capsule, Take 1 capsule (180 mg total) by mouth daily., Disp: 90 capsule, Rfl: 3   diltiazem  (CARDIZEM ) 60 MG tablet, Take 1 tablet (60 mg total) by mouth as needed., Disp: 90 tablet, Rfl: 3   fluticasone  (FLONASE ) 50 MCG/ACT nasal spray, Place 2 sprays  into both nostrils daily., Disp: 48 g, Rfl: 3   GLUCOSAMINE-CHONDROITIN PO, Take 1,500 mg by mouth daily., Disp: , Rfl:    mirabegron ER (MYRBETRIQ) 50 MG TB24 tablet, Take 50 mg by mouth daily., Disp: , Rfl:    Multiple Vitamins-Minerals (MULTIVITAMIN WITH MINERALS) tablet, Take 1 tablet by mouth daily., Disp: , Rfl:    nitroGLYCERIN  (NITROSTAT ) 0.4 MG SL tablet, Place 1 tablet (0.4 mg total) under the tongue every 5 (five) minutes as needed for chest pain., Disp: 25 tablet, Rfl: 3   NON FORMULARY, Pt uses a cpap nightly, Disp: , Rfl:    OVER THE COUNTER MEDICATION, Take 1 tablet by mouth daily. Nerve Health OTC supplement, Disp: , Rfl:    promethazine -dextromethorphan (PROMETHAZINE -DM) 6.25-15 MG/5ML syrup, Take 5 mLs by mouth 4 (four) times daily as needed for cough., Disp: 240 mL, Rfl: 0 Social History   Socioeconomic History   Marital status: Married    Spouse name: [name]    Number of children: 3   Years of education: bachelors   Highest education level: Bachelor's degree (e.g., BA, AB, BS)  Occupational History   Occupation: retired    Comment: Advice worker [employer]  Tobacco Use   Smoking status: Never   Smokeless tobacco: Never   Tobacco comments:    Never smoke 07/27/22  Vaping Use   Vaping status: Never Used  Substance and Sexual Activity   Alcohol use: No   Drug use: No   Sexual activity: Yes  Other Topics Concern   Not on file  Social History Narrative   Married to [place], he has children and grandchildren.   Lives on [place] acres, raises chickens, retired Copywriter, advertising    Financially cares for his disabled nephew and recently started caring for his disabled sister who resides in a nursing facility in [place]   Social Drivers of Health   Financial Resource Strain: Low Risk  (12/03/2023)   Overall Financial Resource Strain (CARDIA)    Difficulty of Paying Living Expenses: Not hard at all  Food Insecurity: No Food Insecurity (12/03/2023)   Hunger  Vital Sign    Worried About Running Out of Food in the Last Year: Never true    Ran Out of Food in the Last Year: Never true  Transportation Needs: No Transportation Needs (12/03/2023)   PRAPARE - Administrator, Civil Service (Medical): No    Lack of Transportation (Non-Medical): No  Physical Activity: Sufficiently Active (12/03/2023)   Exercise Vital Sign    Days of  Exercise per Week: 5 days    Minutes of Exercise per Session: 30 min  Stress: No Stress Concern Present (12/03/2023)   [employer] of Occupational Health - Occupational Stress Questionnaire    Feeling of Stress: Not at all  Social Connections: Socially Integrated (12/03/2023)   Social Connection and Isolation Panel    Frequency of Communication with Friends and Family: More than three times a week    Frequency of Social Gatherings with Friends and Family: Three times a week    Attends Religious Services: More than 4 times per year    Active Member of Clubs or Organizations: Yes    Attends Banker Meetings: More than 4 times per year    Marital Status: Married  Catering manager Violence: Not At Risk (11/09/2023)   Humiliation, Afraid, Rape, and Kick questionnaire    Fear of Current or Ex-Partner: No    Emotionally Abused: No    Physically Abused: No    Sexually Abused: No   Family History  Problem Relation Age of Onset   Heart attack Mother    Diabetes Mother    Macular degeneration Sister    Obesity Sister    Paranoid behavior Sister    Dementia Sister    Cancer Sister        lung - uterus    COPD Sister    Heart attack Brother    Heart disease Brother        cause of death heart attack   Cancer Daughter        colon    Stroke Maternal Grandfather    Stroke Paternal Grandfather    Colon cancer Neg Hx     Objective: Office vital signs reviewed. BP 102/66   Pulse 60   Temp 98.2 F (36.8 C)   Ht 6' 2 (1.88 m)   Wt 219 lb (99.3 kg)   SpO2 98%   BMI 28.12 kg/m   Physical  Examination:  General: Awake, alert, well nourished, No acute distress HEENT: sclera white, MMM Cardio: regular rate and rhythm, S1S2 heard, no murmurs appreciated Pulm: clear to auscultation bilaterally, no wheezes, rhonchi or rales; normal work of breathing on room air MSK: Antalgic gait.  Negative FADIR and negative FABER.  Ambulating independently GU: No suprapubic tenderness palpation.  No palpable masses.  Assessment/ Plan: 78 y.o. male   Mixed hyperlipidemia - Plan: Lipid Panel, TSH  Paroxysmal atrial fibrillation (HCC) - Plan: CBC, Magnesium, CMP14+EGFR  Hypercoagulable state due to paroxysmal atrial fibrillation (HCC) - Plan: CBC  History of prostate cancer - Plan: PSA, mirabegron ER (MYRBETRIQ) 25 MG TB24 tablet  OAB (overactive bladder) - Plan: mirabegron ER (MYRBETRIQ) 25 MG TB24 tablet  Right hip pain  Fasting labs collected.  Check magnesium given A-fib.  He was rate and rhythm controlled today  Check PSA.  He will let me know if he needs referral to [facility] in [place] again.  Okay to trial Myrbetriq 25 and I have sent over that prescription for him.  He will let us  know if this is effective and if not I would defer back to his urologist for ongoing management  ?  Right hip pain from some type of flexor injury given reports of pain with ambulation.  His exam is otherwise unremarkable and he has pretty good range of motion and strength.  He is refractory to prescription management so far.  Has appointment for both sports medicine and physical therapy in place.  I advised  him to contact his specialist to see if there are any other interventions they like to implement prior to those visits   Bellah Alia CHRISTELLA Fielding, Jordan [name] [facility] [phone]

## 2023-12-08 ENCOUNTER — Ambulatory Visit: Payer: Self-pay | Admitting: Family Medicine

## 2023-12-08 LAB — PSA: Prostate Specific Ag, Serum: 1.3 ng/mL (ref 0.0–4.0)

## 2023-12-08 LAB — LIPID PANEL
Cholesterol, Total: 119 mg/dL (ref 100–199)
HDL: 35 mg/dL — AB (ref >39)
LDL CALC COMMENT:: 3.4 ratio (ref 0.0–5.0)
LDL Chol Calc (NIH): 66 mg/dL (ref 0–99)
Triglycerides: 93 mg/dL (ref 0–149)
VLDL Cholesterol Cal: 18 mg/dL (ref 5–40)

## 2023-12-08 LAB — CMP14+EGFR
ALT: 29 IU/L (ref 0–44)
AST: 25 IU/L (ref 0–40)
Albumin: 3.9 g/dL (ref 3.8–4.8)
Alkaline Phosphatase: 83 IU/L (ref 44–121)
BUN/Creatinine Ratio: 24 (ref 10–24)
BUN: 22 mg/dL (ref 8–27)
Bilirubin Total: 0.9 mg/dL (ref 0.0–1.2)
CO2: 23 mmol/L (ref 20–29)
Calcium: 8.9 mg/dL (ref 8.6–10.2)
Chloride: 100 mmol/L (ref 96–106)
Creatinine, Ser: 0.93 mg/dL (ref 0.76–1.27)
Globulin, Total: 2.5 g/dL (ref 1.5–4.5)
Glucose: 92 mg/dL (ref 70–99)
Potassium: 4.3 mmol/L (ref 3.5–5.2)
Sodium: 137 mmol/L (ref 134–144)
Total Protein: 6.4 g/dL (ref 6.0–8.5)
eGFR: 85 mL/min/{1.73_m2} (ref >59)

## 2023-12-08 LAB — CBC
Hematocrit: 45.3 % (ref 37.5–51.0)
Hemoglobin: 14.9 g/dL (ref 13.0–17.7)
MCH: 29.5 pg (ref 26.6–33.0)
MCHC: 32.9 g/dL (ref 31.5–35.7)
MCV: 90 fL (ref 79–97)
Platelets: 214 10*3/uL (ref 150–450)
RBC: 5.05 x10E6/uL (ref 4.14–5.80)
RDW: 13.3 % (ref 11.6–15.4)
WBC: 8.6 10*3/uL (ref 3.4–10.8)

## 2023-12-08 LAB — TSH: TSH: 2.12 u[IU]/mL (ref 0.450–4.500)

## 2023-12-08 LAB — MAGNESIUM: Magnesium: 2.1 mg/dL (ref 1.6–2.3)

## 2023-12-10 ENCOUNTER — Telehealth: Payer: Self-pay | Admitting: Adult Health

## 2023-12-10 DIAGNOSIS — G4733 Obstructive sleep apnea (adult) (pediatric): Secondary | ICD-10-CM

## 2023-12-10 NOTE — Telephone Encounter (Signed)
 Pt called wanting to inform provider that his incidents on his cpap machine are still high. Pt stated that he was asked to call the office to inform provider of this so changes can be made. Please advise.

## 2023-12-11 NOTE — Telephone Encounter (Signed)
   Pt appears to have residual AHI 7.7 times and maxing around 15.6 cmH20. Would you recommend increasing to 17cm or 18 cm?

## 2023-12-11 NOTE — Telephone Encounter (Signed)
 Okay to mildly increase pressure setting to minimum pressure of 7, maximum pressure of 17 cm, EPR of 3. Order placed.

## 2023-12-11 NOTE — Telephone Encounter (Signed)
 Called the patient and advised I brought the information to MD to discuss. Informed that he can increase the minimum pressure to 7 and maximum pressure to 17 and we will see if that will help. I advised I made the changes in [facility] and will notify the DME company but should go in effect tonight.

## 2023-12-18 DIAGNOSIS — M25551 Pain in right hip: Secondary | ICD-10-CM | POA: Diagnosis not present

## 2023-12-18 DIAGNOSIS — R269 Unspecified abnormalities of gait and mobility: Secondary | ICD-10-CM | POA: Diagnosis not present

## 2023-12-24 DIAGNOSIS — M00151 Pneumococcal arthritis, right hip: Secondary | ICD-10-CM | POA: Diagnosis not present

## 2023-12-31 ENCOUNTER — Ambulatory Visit: Attending: Cardiology | Admitting: Cardiology

## 2023-12-31 ENCOUNTER — Encounter: Payer: Self-pay | Admitting: Cardiology

## 2023-12-31 VITALS — BP 126/72 | HR 60 | Ht 74.0 in | Wt 220.0 lb

## 2023-12-31 DIAGNOSIS — I2510 Atherosclerotic heart disease of native coronary artery without angina pectoris: Secondary | ICD-10-CM | POA: Diagnosis not present

## 2023-12-31 DIAGNOSIS — D6869 Other thrombophilia: Secondary | ICD-10-CM

## 2023-12-31 DIAGNOSIS — I480 Paroxysmal atrial fibrillation: Secondary | ICD-10-CM

## 2023-12-31 DIAGNOSIS — G4733 Obstructive sleep apnea (adult) (pediatric): Secondary | ICD-10-CM

## 2023-12-31 NOTE — Progress Notes (Signed)
 Cardiology Office Note:  .   Date:  12/31/2023  ID:  [name], DOB 21-Oct-1945, MRN [id number] PCP: Jolinda Norene HERO, DO  [facility] Providers Cardiologist:  None    History of Present Illness: Jordan [name] is a 78 y.o. male Discussed the use of AI scribe software for clinical note transcription with the patient, who gave verbal consent to proceed.  History of Present Illness Jordan [name] is a 78 year old male with atrial fibrillation and coronary artery disease who presents for follow-up regarding his cardiac health.  He experiences rapid palpitations and tachycardia, as recorded by his KardiaMobile device, which showed episodes of atrial fibrillation and unclassified rhythms over the past three months. An episode of atrial fibrillation in May required him to take diltiazem  60 mg as needed, which he associates with increased stress related to his grandson's graduation. He underwent an atrial fibrillation ablation on Oct 25, 2021, which had been effective for about a year.  He has a history of coronary artery disease with a stent placed in the LAD in 2002. He continues to take atorvastatin  80 mg daily for hyperlipidemia, with his LDL at goal at 66. 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  He is on Eliquis  5 mg twice daily for atrial fibrillation with a CHADS-VASc score of 2. He experiences hip pain, which he attributes to arthritis and a possible partial tear of the gluteus medius muscle, exacerbated by playing pickleball.  Should not take long-term anti-inflammatories with Eliquis  use.  He uses a CPAP machine for obstructive sleep apnea and reports compliance with its use. He also mentioned a recent change in medication for prostate issues, starting Mirtrex at 25 mg, which has helped with bladder issues despite causing some  dizziness and headaches. No fevers or chills reported.    Studies Reviewed: .        Results LABS LDL: 66 HbA1c: 5.6 Hb: 14.5 Cr: 0.9 HDL: 36  DIAGNOSTIC Echocardiogram: EF 60%, trivial mitral regurgitation, mild dilation of ascending aorta, sinus of Valsalva 40 mm (09/12/2021) Risk Assessment/Calculations:        Physical Exam:   VS:  BP 126/72   Pulse 60   Ht 6' 2 (1.88 m)   Wt 220 lb (99.8 kg)   SpO2 97%   BMI 28.25 kg/m    Wt Readings from Last 3 Encounters:  12/31/23 220 lb (99.8 kg)  12/07/23 219 lb (99.3 kg)  11/20/23 223 lb 6.4 oz (101.3 kg)    GEN: Well nourished, well developed in no acute distress NECK: No JVD; No carotid bruits CARDIAC: RRR, no murmurs, no rubs, no gallops RESPIRATORY:  Clear to auscultation without rales, wheezing or rhonchi  ABDOMEN: Soft, non-tender, non-distended EXTREMITIES:  No edema; No deformity   ASSESSMENT AND PLAN: .    Assessment and Plan Assessment & Plan Paroxysmal atrial fibrillation Intermittent episodes of atrial fibrillation, with a recent episode in May likely stress-related. Previous ablation in May 2023 was effective for about a year. Currently managed with diltiazem  and Eliquis . Discussed the potential use of the Watchman device, which could eliminate the need for anticoagulation but does not prevent AFib episodes. Emphasized the importance of not altering Eliquis  dosage without medical advice due to increased stroke risk. - Continue Eliquis  5 mg twice daily. (Do not manipulate dosage) - Continue diltiazem   180 mg daily, with 60 mg as needed for episodes. - Consider Watchman device if anticoagulation becomes problematic.  Currently doing well with this.  Coronary artery disease without angina Coronary artery disease with a history of stent placement in the LAD in 2002. No current angina symptoms. Managed with atorvastatin  for hyperlipidemia. - Continue atorvastatin  80 mg daily.  Hyperlipidemia Hyperlipidemia  well-controlled with atorvastatin . LDL at goal of 66 mg/dL. HDL remains low at 36 mg/dL, likely due to genetics. - Continue atorvastatin  80 mg daily.  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 No acute issues noted.  Obstructive sleep apnea Obstructive sleep apnea managed with CPAP. - Continue using CPAP regularly.         Dispo: 1 yr AFIB clinic  Signed, Oneil Parchment, MD

## 2023-12-31 NOTE — Patient Instructions (Signed)
 Medication Instructions:  Your physician recommends that you continue on your current medications as directed. Please refer to the Current Medication list given to you today.  *If you need a refill on your cardiac medications before your next appointment, please call your pharmacy*  Lab Work: None ordered.  If you have labs (blood work) drawn today and your tests are completely normal, you will receive your results only by: MyChart Message (if you have MyChart) OR A paper copy in the mail If you have any lab test that is abnormal or we need to change your treatment, we will call you to review the results.  Testing/Procedures: None ordered.   Follow-Up: At [facility], you and your health needs are our priority.  As part of our continuing mission to provide you with exceptional heart care, our providers are all part of one team.  This team includes your primary Cardiologist (physician) and Advanced Practice Providers or APPs (Physician Assistants and Nurse Practitioners) who all work together to provide you with the care you need, when you need it.  Your next appointment:   12 months with [facility]

## 2024-01-01 DIAGNOSIS — M25551 Pain in right hip: Secondary | ICD-10-CM | POA: Diagnosis not present

## 2024-01-08 DIAGNOSIS — M5416 Radiculopathy, lumbar region: Secondary | ICD-10-CM | POA: Diagnosis not present

## 2024-01-08 DIAGNOSIS — M25551 Pain in right hip: Secondary | ICD-10-CM | POA: Diagnosis not present

## 2024-01-10 DIAGNOSIS — M1611 Unilateral primary osteoarthritis, right hip: Secondary | ICD-10-CM | POA: Diagnosis not present

## 2024-01-17 ENCOUNTER — Ambulatory Visit: Payer: Self-pay

## 2024-01-17 NOTE — Telephone Encounter (Signed)
 Pt has an appt 8/8

## 2024-01-17 NOTE — Telephone Encounter (Signed)
 FYI Only or Action Required?: FYI only for provider.  Patient was last seen in primary care on 12/07/2023 by Jolinda Norene HERO, DO.  Called Nurse Triage reporting Dizziness.  Symptoms began several days ago.  Interventions attempted: Rest, hydration, or home remedies.  Symptoms are: unchanged.  Triage Disposition: See Physician Within 24 Hours  Patient/caregiver understands and will follow disposition?: Yes  **Appt. Scheduled for 8/8**            Copied from CRM #[id number]. Topic: Clinical - Red Word Triage >> Jan 17, 2024  4:27 PM Avram MATSU wrote: Red Word that prompted transfer to Nurse Triage: lightheadedness/dizzy walking,sitting,bending over. Reason for Disposition  [1] MODERATE dizziness (e.g., interferes with normal activities) AND [2] has NOT been evaluated by doctor (or NP/PA) for this  (Exception: Dizziness caused by heat exposure, sudden standing, or poor fluid intake.)  Answer Assessment - Initial Assessment Questions 1. DESCRIPTION: Describe your dizziness.     Patient stated when standing, or ambulating her feels dizzy.   2. LIGHTHEADED: Do you feel lightheaded? (e.g., somewhat faint, woozy, weak upon standing)     Upon ambulating  3. VERTIGO: Do you feel like either you or the room is spinning or tilting? (i.e., vertigo)     No   4. SEVERITY: How bad is it?  Do you feel like you are going to faint? Can you stand and walk?     Moderate  5. ONSET:  When did the dizziness begin?     Since Monday   6. AGGRAVATING FACTORS: Does anything make it worse? (e.g., standing, change in head position)      Sitting helps with the symptoms   8. CAUSE: What do you think is causing the dizziness? (e.g., decreased fluids or food, diarrhea, emotional distress, heat exposure, new medicine, sudden standing, vomiting; unknown)     Unknown   9. RECURRENT SYMPTOM: Have you had dizziness before? If Yes, ask: When was the last time? What happened that  time?     No  10. OTHER SYMPTOMS: Do you have any other symptoms? (e.g., fever, chest pain, vomiting, diarrhea, bleeding)  No  Protocols used: Dizziness - Lightheadedness-A-AH

## 2024-01-18 ENCOUNTER — Ambulatory Visit (INDEPENDENT_AMBULATORY_CARE_PROVIDER_SITE_OTHER): Admitting: Family

## 2024-01-18 ENCOUNTER — Encounter: Payer: Self-pay | Admitting: Family

## 2024-01-18 VITALS — BP 124/77 | HR 65 | Temp 98.9°F | Ht 74.0 in | Wt 218.0 lb

## 2024-01-18 DIAGNOSIS — H8110 Benign paroxysmal vertigo, unspecified ear: Secondary | ICD-10-CM | POA: Diagnosis not present

## 2024-01-18 DIAGNOSIS — R42 Dizziness and giddiness: Secondary | ICD-10-CM

## 2024-01-18 MED ORDER — ONDANSETRON HCL 4 MG PO TABS: 4.0000 mg | ORAL_TABLET | Freq: Three times a day (TID) | ORAL | 0 refills | Status: AC | PRN

## 2024-01-18 MED ORDER — MECLIZINE HCL 25 MG PO TABS: 25.0000 mg | ORAL_TABLET | Freq: Three times a day (TID) | ORAL | 1 refills | Status: AC | PRN

## 2024-01-18 NOTE — Patient Instructions (Signed)
 Benign Positional Vertigo Vertigo is the feeling that you or your surroundings are moving when they are not. Benign positional vertigo is the most common form of vertigo. This is usually a harmless condition (benign). This condition is positional. This means that symptoms are triggered by certain movements and positions. This condition can be dangerous if it occurs while you are doing something that could cause harm to yourself or others. This includes activities such as driving or operating machinery. What are the causes? The inner ear has fluid-filled canals that help your brain sense movement and balance. When the fluid moves, the brain receives messages about your body's position. With benign positional vertigo, calcium crystals in the inner ear break free and disturb the inner ear area. This causes your brain to receive confusing messages about your body's position. What increases the risk? You are more likely to develop this condition if: You are a woman. You are 77 years of age or older. You have recently had a head injury. You have an inner ear disease. What are the signs or symptoms? Symptoms of this condition usually happen when you move your head or your eyes in different directions. Symptoms may start suddenly and usually last for less than a minute. They include: Loss of balance and falling. Feeling like you are spinning or moving. Feeling like your surroundings are spinning or moving. Nausea and vomiting. Blurred vision. Dizziness. Involuntary eye movement (nystagmus). Symptoms can be mild and cause only minor problems, or they can be severe and interfere with daily life. Episodes of benign positional vertigo may return (recur) over time. Symptoms may also improve over time. How is this diagnosed? This condition may be diagnosed based on: Your medical history. A physical exam of the head, neck, and ears. Positional tests to check for or stimulate vertigo. You may be asked to  turn your head and change positions, such as going from sitting to lying down. A health care provider will watch for symptoms of vertigo. You may be referred to a health care provider who specializes in ear, nose, and throat problems (ENT or otolaryngologist) or a provider who specializes in disorders of the nervous system (neurologist). How is this treated?  This condition may be treated in a session in which your health care provider moves your head in specific positions to help the displaced crystals in your inner ear move. Treatment for this condition may take several sessions. Surgery may be needed in severe cases, but this is rare. In some cases, benign positional vertigo may resolve on its own in 2-4 weeks. Follow these instructions at home: Safety Move slowly. Avoid sudden body or head movements or certain positions, as told by your health care provider. Avoid driving or operating machinery until your health care provider says it is safe. Avoid doing any tasks that would be dangerous to you or others if vertigo occurs. If you have trouble walking or keeping your balance, try using a cane for stability. If you feel dizzy or unstable, sit down right away. Return to your normal activities as told by your health care provider. Ask your health care provider what activities are safe for you. General instructions Take over-the-counter and prescription medicines only as told by your health care provider. Drink enough fluid to keep your urine pale yellow. Keep all follow-up visits. This is important. Contact a health care provider if: You have a fever. Your condition gets worse or you develop new symptoms. Your family or friends notice any behavioral changes.  You have nausea or vomiting that gets worse. You have numbness or a prickling and tingling sensation. Get help right away if you: Have difficulty speaking or moving. Are always dizzy or faint. Develop severe headaches. Have weakness in  your legs or arms. Have changes in your hearing or vision. Develop a stiff neck. Develop sensitivity to light. These symptoms may represent a serious problem that is an emergency. Do not wait to see if the symptoms will go away. Get medical help right away. Call your local emergency services (911 in the [place] Do not drive yourself to the hospital. Summary Vertigo is the feeling that you or your surroundings are moving when they are not. Benign positional vertigo is the most common form of vertigo. This condition is caused by calcium crystals in the inner ear that become displaced. This causes a disturbance in an area of the inner ear that helps your brain sense movement and balance. Symptoms include loss of balance and falling, feeling that you or your surroundings are moving, nausea and vomiting, and blurred vision. This condition can be diagnosed based on symptoms, a physical exam, and positional tests. Follow safety instructions as told by your health care provider and keep all follow-up visits. This is important. This information is not intended to replace advice given to you by your health care provider. Make sure you discuss any questions you have with your health care provider. Document Revised: 12/18/2022 Document Reviewed: 12/18/2022 Elsevier Patient Education  2024 [employer].

## 2024-01-18 NOTE — Progress Notes (Signed)
 Subjective:    Patient ID: Jordan [name], male    DOB: May 18, 1946, 78 y.o.   MRN: [id number]  Chief Complaint  Patient presents with   Dizziness   PT presents to the office today with dizziness that started 5 days ago.   States he started Myrbetriq  25 mg about 5 weeks ago for OAB and worried this caused his dizziness.  Dizziness This is a new problem. The current episode started in the past 7 days. The problem occurs intermittently. The problem has been waxing and waning. Associated symptoms include headaches, swollen glands and vertigo. Pertinent negatives include no anorexia, arthralgias, change in bowel habit, chest pain, chills, congestion, coughing, diaphoresis, fatigue, fever, joint swelling, nausea, numbness, rash, sore throat, urinary symptoms, visual change, vomiting or weakness. The symptoms are aggravated by bending and twisting. He has tried rest for the symptoms. The treatment provided mild relief.      Review of Systems  Constitutional:  Negative for chills, diaphoresis, fatigue and fever.  HENT:  Negative for congestion and sore throat.   Respiratory:  Negative for cough.   Cardiovascular:  Negative for chest pain.  Gastrointestinal:  Negative for anorexia, change in bowel habit, nausea and vomiting.  Musculoskeletal:  Negative for arthralgias and joint swelling.  Skin:  Negative for rash.  Neurological:  Positive for dizziness, vertigo and headaches. Negative for weakness and numbness.  All other systems reviewed and are negative.   Social History   Socioeconomic History   Marital status: Married    Spouse name: [name]    Number of children: 3   Years of education: bachelors   Highest education level: Bachelor's degree (e.g., BA, AB, BS)  Occupational History   Occupation: retired    Comment: [employer]  Tobacco Use   Smoking status: Never   Smokeless tobacco: Never   Tobacco comments:    Never smoke 07/27/22  Vaping Use    Vaping status: Never Used  Substance and Sexual Activity   Alcohol use: No   Drug use: No   Sexual activity: Yes  Other Topics Concern   Not on file  Social History Narrative   Married to [place], he has children and grandchildren.   [place], raises chickens, retired Copywriter, advertising    Financially cares for his disabled nephew and recently started caring for his disabled sister who resides in a nursing facility in [place]   Social Drivers of Health   Financial Resource Strain: Low Risk  (12/03/2023)   Overall Financial Resource Strain (CARDIA)    Difficulty of Paying Living Expenses: Not hard at all  Food Insecurity: No Food Insecurity (12/03/2023)   Hunger Vital Sign    Worried About Running Out of Food in the Last Year: Never true    Ran Out of Food in the Last Year: Never true  Transportation Needs: No Transportation Needs (12/03/2023)   PRAPARE - Administrator, Civil Service (Medical): No    Lack of Transportation (Non-Medical): No  Physical Activity: Sufficiently Active (12/03/2023)   Exercise Vital Sign    Days of Exercise per Week: 5 days    Minutes of Exercise per Session: 30 min  Stress: No Stress Concern Present (12/03/2023)   [employer] of Occupational Health - Occupational Stress Questionnaire    Feeling of Stress: Not at all  Social Connections: Socially Integrated (12/03/2023)   Social Connection and Isolation Panel    Frequency of Communication with Friends and Family:  More than three times a week    Frequency of Social Gatherings with Friends and Family: Three times a week    Attends Religious Services: More than 4 times per year    Active Member of Clubs or Organizations: Yes    Attends Engineer, structural: More than 4 times per year    Marital Status: Married   Family History  Problem Relation Age of Onset   Heart attack Mother    Diabetes Mother    Macular degeneration Sister    Obesity Sister    Paranoid behavior  Sister    Dementia Sister    Cancer Sister        lung - uterus    COPD Sister    Heart attack Brother    Heart disease Brother        cause of death heart attack   Cancer Daughter        colon    Stroke Maternal Grandfather    Stroke Paternal Grandfather    Colon cancer Neg Hx         Objective:   Physical Exam Vitals reviewed.  Constitutional:      General: He is not in acute distress.    Appearance: He is well-developed.  HENT:     Head: Normocephalic.     Right Ear: A middle ear effusion is present.     Left Ear: A middle ear effusion is present.  Eyes:     General:        Right eye: No discharge.        Left eye: No discharge.     Pupils: Pupils are equal, round, and reactive to light.  Neck:     Thyroid : No thyromegaly.  Cardiovascular:     Rate and Rhythm: Normal rate and regular rhythm.     Heart sounds: Normal heart sounds. No murmur heard. Pulmonary:     Effort: Pulmonary effort is normal. No respiratory distress.     Breath sounds: Normal breath sounds. No wheezing.  Abdominal:     General: Bowel sounds are normal. There is no distension.     Palpations: Abdomen is soft.     Tenderness: There is no abdominal tenderness.  Musculoskeletal:        General: No tenderness. Normal range of motion.     Cervical back: Normal range of motion and neck supple.  Skin:    General: Skin is warm and dry.     Findings: No erythema or rash.  Neurological:     Mental Status: He is alert and oriented to person, place, and time.     Cranial Nerves: No cranial nerve deficit.     Deep Tendon Reflexes: Reflexes are normal and symmetric.     Comments: Dizziness present when looking up, standing, or turning head  Psychiatric:        Behavior: Behavior normal.        Thought Content: Thought content normal.        Judgment: Judgment normal.       BP 120/77   Pulse 73   Temp 98.9 F (37.2 C)   Ht 6' 2 (1.88 m)   Wt 218 lb (98.9 kg)   SpO2 97%   BMI 27.99 kg/m       Assessment & Plan:  NAYTHEN HEIKKILA comes in today with chief complaint of Dizziness   Diagnosis and orders addressed:  1. Benign paroxysmal positional vertigo, unspecified laterality (Primary) Fall precautions  discussed Start antivert  TID for the next 3 days then as needed Zofran  as needed Epley exercises discussed, handout given  If symptoms do not improve will place order for PT If symptoms worsen or changes in gait will do CT head Labs reviewed from PCP in June, stable Keep follow up with PCP - meclizine  (ANTIVERT ) 25 MG tablet; Take 1 tablet (25 mg total) by mouth 3 (three) times daily as needed for dizziness.  Dispense: 60 tablet; Refill: 1 - ondansetron  (ZOFRAN ) 4 MG tablet; Take 1 tablet (4 mg total) by mouth every 8 (eight) hours as needed for nausea or vomiting.  Dispense: 20 tablet; Refill: 0  2. Dizziness - meclizine  (ANTIVERT ) 25 MG tablet; Take 1 tablet (25 mg total) by mouth 3 (three) times daily as needed for dizziness.  Dispense: 60 tablet; Refill: 1 - ondansetron  (ZOFRAN ) 4 MG tablet; Take 1 tablet (4 mg total) by mouth every 8 (eight) hours as needed for nausea or vomiting.  Dispense: 20 tablet; Refill: 0    Bari Learn, FNP

## 2024-01-30 DIAGNOSIS — M1611 Unilateral primary osteoarthritis, right hip: Secondary | ICD-10-CM | POA: Diagnosis not present

## 2024-02-19 DIAGNOSIS — Z85828 Personal history of other malignant neoplasm of skin: Secondary | ICD-10-CM | POA: Diagnosis not present

## 2024-02-19 DIAGNOSIS — B078 Other viral warts: Secondary | ICD-10-CM | POA: Diagnosis not present

## 2024-02-19 DIAGNOSIS — I788 Other diseases of capillaries: Secondary | ICD-10-CM | POA: Diagnosis not present

## 2024-02-19 DIAGNOSIS — L820 Inflamed seborrheic keratosis: Secondary | ICD-10-CM | POA: Diagnosis not present

## 2024-05-14 ENCOUNTER — Other Ambulatory Visit: Payer: Self-pay | Admitting: Urology

## 2024-05-14 DIAGNOSIS — C61 Malignant neoplasm of prostate: Secondary | ICD-10-CM

## 2024-06-24 ENCOUNTER — Ambulatory Visit: Payer: Self-pay | Admitting: Family Medicine

## 2024-06-24 ENCOUNTER — Telehealth: Payer: Self-pay | Admitting: Family Medicine

## 2024-06-24 ENCOUNTER — Encounter: Payer: Self-pay | Admitting: Family Medicine

## 2024-06-24 VITALS — BP 108/68 | HR 63 | Temp 98.2°F | Ht 74.0 in | Wt 223.0 lb

## 2024-06-24 DIAGNOSIS — N401 Enlarged prostate with lower urinary tract symptoms: Secondary | ICD-10-CM

## 2024-06-24 DIAGNOSIS — R351 Nocturia: Secondary | ICD-10-CM

## 2024-06-24 DIAGNOSIS — J301 Allergic rhinitis due to pollen: Secondary | ICD-10-CM | POA: Diagnosis not present

## 2024-06-24 DIAGNOSIS — E663 Overweight: Secondary | ICD-10-CM

## 2024-06-24 DIAGNOSIS — E782 Mixed hyperlipidemia: Secondary | ICD-10-CM

## 2024-06-24 DIAGNOSIS — Z8546 Personal history of malignant neoplasm of prostate: Secondary | ICD-10-CM

## 2024-06-24 DIAGNOSIS — I2510 Atherosclerotic heart disease of native coronary artery without angina pectoris: Secondary | ICD-10-CM | POA: Diagnosis not present

## 2024-06-24 DIAGNOSIS — D6869 Other thrombophilia: Secondary | ICD-10-CM

## 2024-06-24 DIAGNOSIS — I480 Paroxysmal atrial fibrillation: Secondary | ICD-10-CM

## 2024-06-24 DIAGNOSIS — Z0000 Encounter for general adult medical examination without abnormal findings: Secondary | ICD-10-CM | POA: Diagnosis not present

## 2024-06-24 LAB — LIPID PANEL

## 2024-06-24 MED ORDER — FLUTICASONE PROPIONATE 50 MCG/ACT NA SUSP: 2.0000 | Freq: Every day | NASAL | 3 refills | Status: AC

## 2024-06-24 MED ORDER — ATORVASTATIN CALCIUM 80 MG PO TABS: ORAL_TABLET | ORAL | 3 refills | Status: AC

## 2024-06-24 MED ORDER — WEGOVY 0.25 MG/0.5ML ~~LOC~~ SOAJ: 0.2500 mg | SUBCUTANEOUS | 3 refills | Status: AC

## 2024-06-24 MED ORDER — DILTIAZEM HCL 60 MG PO TABS: 60.0000 mg | ORAL_TABLET | ORAL | 3 refills | Status: AC | PRN

## 2024-06-24 MED ORDER — APIXABAN 5 MG PO TABS: ORAL_TABLET | ORAL | 3 refills | Status: AC

## 2024-06-24 MED ORDER — DILTIAZEM HCL ER COATED BEADS 180 MG PO CP24: 180.0000 mg | ORAL_CAPSULE | Freq: Every day | ORAL | 3 refills | Status: AC

## 2024-06-24 MED ORDER — NITROGLYCERIN 0.4 MG SL SUBL: 0.4000 mg | SUBLINGUAL_TABLET | SUBLINGUAL | 3 refills | Status: AC | PRN

## 2024-06-24 NOTE — Addendum Note (Signed)
 Addended by: JOLINDA NORENE HERO on: 06/24/2024 12:02 PM   Modules accepted: Orders

## 2024-06-24 NOTE — Progress Notes (Addendum)
 "  Jordan [name] is a 79 y.o. male presents to office today for annual physical exam examination.    Patient reports overall he has been doing well.  He continues to struggle with some arthritis but overall no major concerns.  He is under the care of Dr. Velinda Citron with [facility].  He reports stability of atrial fibrillation and utilizes a home monitoring system.  He has had a couple of breakthrough A-fib since the ablation but nothing that is been sustained.  Compliant with all medicines and reports no rectal bleeding, chest pain or shortness of breath.  Continues to workout regularly and plays pickle ball regularly.  He has since switched from his prostate doctor in [place] to Dr. Donnice Siad with [facility] and has a follow-up in June.  Continues to have surveillance for prostate cancer as he still has part of the prostate present.  Continues to follow-up with cardiology as directed.  Interested in some weight loss and specifically would want to pursue medicinal treatment as he feels like this extra weight causes increased arthritic issues in the hips  Occupation: Retired, Marital status: Married to Jordan [name], Substance use: None There are no preventive care reminders to display for this patient.   Immunization History  Administered Date(s) Administered   Influenza,inj,Quad PF,6+ Mos 02/24/2013, 03/28/2014, 03/26/2015, 03/10/2017, 03/26/2018   Influenza-Unspecified 03/25/2016, 03/26/2018, 03/13/2019, 02/26/2023, 03/25/2024   Moderna SARS-COV2 Booster Vaccination 02/26/2023   PFIZER(Purple Top)SARS-COV-2 Vaccination 07/08/2019, 07/29/2019, 03/11/2020, 09/17/2020   Pfizer(Comirnaty)Fall Seasonal Vaccine 12 years and older 04/11/2022   Pneumococcal Conjugate-13 11/07/2013   Pneumococcal Polysaccharide-23 10/16/2012   Tdap 11/28/2016   Zoster Recombinant(Shingrix) 10/11/2017, 12/10/2017   Past Medical History:  Diagnosis Date   Anxiety    Mild   Atrial fibrillation (HCC)     Paroxysmal   BPH (benign prostatic hyperplasia)    CAD (coronary artery disease)    Stent LAD, 2002  /  nuclear 2009, no ischemia  /    Hospital October, 2011 nuclear, no ischemia, possible slight apical scar, ejection fraction 70%   Cancer (HCC)    prostate CA-non aggressive   Carotid bruit    Doppler November, 2011, normal   Chest pain 02/23/2013   Chronic anticoagulation    Low CHADS , score, but patient wants to be aggressive   Colon polyps    COVID-19    + long Covid problems   Drug intolerance    Mild beta blocker intolerance   Dyslipidemia    Ejection fraction    EF 65-70%, echo, 2007 /  EF 70%, nuclear, 2011   History of meniscal tear 10/18/2021   Hyperlipidemia    IBS (irritable bowel syndrome)    Incomplete RBBB    Knee pain    left   Palpitations    Personal history of colonic adenomas 11/29/2006   Qualifier: Diagnosis of  By: Nickola CMA, [place]     Sleep apnea    Syncope    November, 2010   Traumatic tear of lateral meniscus of right knee 03/26/2011   Social History   Socioeconomic History   Marital status: Married    Spouse name: [name]    Number of children: 3   Years of education: bachelors   Highest education level: Bachelor's degree (e.g., BA, AB, BS)  Occupational History   Occupation: retired    Comment: [employer]  Tobacco Use   Smoking status: Never   Smokeless tobacco: Never   Tobacco comments:  Never smoke 07/27/22  Vaping Use   Vaping status: Never Used  Substance and Sexual Activity   Alcohol use: No   Drug use: No   Sexual activity: Yes  Other Topics Concern   Not on file  Social History Narrative   Married to Jordan [name], he has children and grandchildren.   [place], raises chickens, retired copywriter, advertising    Financially cares for his disabled nephew and recently started caring for his disabled sister who resides in a nursing facility in [place]   Social Drivers of Health   Tobacco Use: Low  Risk (06/24/2024)   Patient History    Smoking Tobacco Use: Never    Smokeless Tobacco Use: Never    Passive Exposure: Not on file  Financial Resource Strain: Low Risk (06/23/2024)   Overall Financial Resource Strain (CARDIA)    Difficulty of Paying Living Expenses: Not hard at all  Food Insecurity: No Food Insecurity (06/23/2024)   Epic    Worried About Programme Researcher, Broadcasting/film/video in the Last Year: Never true    Ran Out of Food in the Last Year: Never true  Transportation Needs: No Transportation Needs (06/23/2024)   Epic    Lack of Transportation (Medical): No    Lack of Transportation (Non-Medical): No  Physical Activity: Sufficiently Active (06/23/2024)   Exercise Vital Sign    Days of Exercise per Week: 5 days    Minutes of Exercise per Session: 40 min  Stress: No Stress Concern Present (06/23/2024)   [employer] of Occupational Health - Occupational Stress Questionnaire    Feeling of Stress: Not at all  Social Connections: Socially Integrated (06/23/2024)   Social Connection and Isolation Panel    Frequency of Communication with Friends and Family: More than three times a week    Frequency of Social Gatherings with Friends and Family: More than three times a week    Attends Religious Services: More than 4 times per year    Active Member of Clubs or Organizations: Yes    Attends Banker Meetings: More than 4 times per year    Marital Status: Married  Catering Manager Violence: Not At Risk (11/09/2023)   Humiliation, Afraid, Rape, and Kick questionnaire    Fear of Current or Ex-Partner: No    Emotionally Abused: No    Physically Abused: No    Sexually Abused: No  Depression (PHQ2-9): Low Risk (01/18/2024)   Depression (PHQ2-9)    PHQ-2 Score: 0  Alcohol Screen: Low Risk (11/09/2023)   Alcohol Screen    Last Alcohol Screening Score (AUDIT): 0  Housing: Unknown (06/23/2024)   Epic    Unable to Pay for Housing in the Last Year: No    Number of Times Moved in the Last  Year: Not on file    Homeless in the Last Year: No  Utilities: Not At Risk (11/09/2023)   AHC Utilities    Threatened with loss of utilities: No  Health Literacy: Adequate Health Literacy (11/09/2023)   B1300 Health Literacy    Frequency of need for help with medical instructions: Never   Past Surgical History:  Procedure Laterality Date   ATRIAL FIBRILLATION ABLATION N/A 10/25/2021   Procedure: ATRIAL FIBRILLATION ABLATION;  Surgeon: Inocencio Soyla Lunger, MD;  Location: [facility];  Service: Cardiovascular;  Laterality: N/A;   BACK SURGERY  05/1990   [id number]   CARDIAC CATHETERIZATION     COLONOSCOPY  multiple   IR EMBO VENOUS NOT HEMORR HEMANG  INC GUIDE ROADMAPPING  02/08/2023   IR RADIOLOGIST EVAL & MGMT  01/08/2023   IR RADIOLOGIST EVAL & MGMT  02/16/2023   LUMBAR LAMINECTOMY/ DECOMPRESSION WITH MET-RX Left 09/13/2022   Procedure: Minimally Invasive  Laminectomy, Medical Facetectomy ,Microdiscectomy Left Lumbar Four-Five;  Surgeon: Debby Dorn MATSU, MD;  Location: [facility] OR;  Service: Neurosurgery;  Laterality: Left;  3C   PROSTATE SURGERY     UMBILICAL HERNIA REPAIR  07/15/2021   [facility]   XI ROBOTIC ASSISTED SIMPLE PROSTATECTOMY  2022   Family History  Problem Relation Age of Onset   Heart attack Mother    Diabetes Mother    Macular degeneration Sister    Obesity Sister    Paranoid behavior Sister    Dementia Sister    Cancer Sister        lung - uterus    COPD Sister    Heart attack Brother    Heart disease Brother        cause of death heart attack   Cancer Daughter        colon    Stroke Maternal Grandfather    Stroke Paternal Grandfather    Colon cancer Neg Hx    Current Medications[1]  Allergies[2]   ROS: Review of Systems Pertinent items noted in HPI and remainder of comprehensive ROS otherwise negative.    Physical exam BP 108/68   Pulse 63   Temp 98.2 F (36.8 C)   Ht 6' 2 (1.88 m)   Wt 223 lb (101.2 kg)   SpO2 99%   BMI 28.63 kg/m  General  appearance: alert, cooperative, appears stated age, and no distress Head: Normocephalic, without obvious abnormality, atraumatic Eyes: negative findings: lids and lashes normal, conjunctivae and sclerae normal, corneas clear, and pupils equal, round, reactive to light and accomodation Ears: normal TM's and external ear canals both ears Nose: Nares normal. Septum midline. Mucosa normal. No drainage or sinus tenderness. Throat: lips, mucosa, and tongue normal; teeth and gums normal Neck: no adenopathy, no carotid bruit, supple, symmetrical, trachea midline, and thyroid  not enlarged, symmetric, no tenderness/mass/nodules Back: Increased kyphosis of thoracic spine.  Ambulating independently. Lungs: clear to auscultation bilaterally Chest wall: no tenderness Heart: Slightly bradycardic but regular rate and rhythm with no murmurs appreciated Abdomen: Soft, nontender, nondistended Extremities: extremities normal, atraumatic, no cyanosis or edema Pulses: 2+ and symmetric Skin: Skin color, texture, turgor normal. No rashes or lesions Lymph nodes: No supraclavicular or anterior cervical lymphadenopathy Neurologic: Alert and oriented X 3, normal strength and tone. Normal symmetric reflexes. Normal coordination and gait . wears hearing aids     06/24/2024   11:09 AM 01/18/2024    9:44 AM 12/07/2023   10:23 AM  Depression screen PHQ 2/9  Decreased Interest 0 0 0  Down, Depressed, Hopeless 0 0 0  PHQ - 2 Score 0 0 0  Altered sleeping 0 0 0  Tired, decreased energy 1 0 0  Change in appetite 0 0 0  Feeling bad or failure about yourself  0 0 0  Trouble concentrating 0 0 0  Moving slowly or fidgety/restless 0 0 0  Suicidal thoughts 0 0 0  PHQ-9 Score 1 0  0   Difficult doing work/chores Not difficult at all Not difficult at all Not difficult at all     Data saved with a previous flowsheet row definition      06/24/2024   11:10 AM 01/18/2024    9:44 AM 12/07/2023   10:23 AM 10/16/2023  8:19 AM  GAD  7 : Generalized Anxiety Score  Nervous, Anxious, on Edge 0 0 0 0  Control/stop worrying 0 0 0 0  Worry too much - different things 0 0 0 0  Trouble relaxing 0 0 0 0  Restless 0 0 0 0  Easily annoyed or irritable 0 0 0 0  Afraid - awful might happen 0 0 0 0  Total GAD 7 Score 0 0 0 0  Anxiety Difficulty Not difficult at all Not difficult at all Not difficult at all Not difficult at all    No results found for this or any previous visit (from the past 2160 hours).   Assessment/ Plan: Claudean LELON Fox here for annual physical exam.   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  I will reach out to cardiology to see if they are okay with starting a GLP.  I think that probably at very low dose 0.25 or 0.5 would be effective in helping him achieve weight goals as well as providing cardioprotection given known CAD.  No apparent contraindications to utilization of this medication.  Continue to follow-up with urology, orthopedics and cardiology as directed  Appropriate meds have been renewed for patient  **Dr. Jeffrie replied and okay with starting GLP.  I consulted with our clinical pharmacist  who will aid in prior authorization.  Patient has no apparent contraindications to utilization of this medication. Body mass index is 28.63 kg/m.  Comorbidities include obstructive sleep apnea on CPAP, atrial fibrillation, hypertension, hyperlipidemia, CAD status post stent with very strong family history of coronary artery disease/ischemia.   Counseled on healthy lifestyle choices, including diet (rich in fruits, vegetables and lean meats and low in salt and simple carbohydrates) and exercise (at least 30 minutes of moderate physical activity daily).  Patient to follow up 80m  Shadoe Bethel M. Lamarkus Nebel, DO        [1]  Current Outpatient Medications:    Calcium  Carb-Cholecalciferol  (CALCIUM  600+D3 PO), Take 1 capsule by mouth daily., Disp: , Rfl:    carboxymethylcellulose (REFRESH PLUS) 0.5 % SOLN, Place 1 drop into both eyes 3 (three) times daily as needed (dry eyes)., Disp: , Rfl:    Cholecalciferol  (VITAMIN D3) 1000 UNITS CAPS, Take 1,000 Units by mouth daily., Disp: , Rfl:    GLUCOSAMINE-CHONDROITIN PO, Take 1,500 mg by mouth daily., Disp: , Rfl:    meclizine  (ANTIVERT ) 25 MG tablet, Take 1 tablet (25 mg total) by mouth 3 (three) times daily as needed for dizziness., Disp: 60 tablet, Rfl: 1   mirabegron  ER (MYRBETRIQ ) 50 MG TB24 tablet, Take 50 mg by mouth daily., Disp: , Rfl:    Multiple Vitamins-Minerals (MULTIVITAMIN WITH MINERALS) tablet, Take 1 tablet by mouth daily., Disp: , Rfl:    NON FORMULARY, Pt uses a cpap  nightly, Disp: , Rfl:    ondansetron  (ZOFRAN ) 4 MG tablet, Take 1 tablet (4 mg total) by mouth every 8 (eight) hours as needed for nausea or vomiting., Disp: 20 tablet, Rfl: 0   OVER THE COUNTER MEDICATION, Take 1 tablet by mouth daily. Nerve Health OTC supplement, Disp: , Rfl:    apixaban  (ELIQUIS ) 5 MG TABS tablet, TAKE  (1)  TABLET TWICE A DAY., Disp: 180 tablet, Rfl: 3   atorvastatin  (LIPITOR) 80 MG tablet, TAKE ONE TABLET ONCE DAILY AT 6PM, Disp: 90 tablet, Rfl: 3    diltiazem  (CARDIZEM  CD) 180 MG 24 hr capsule, Take 1 capsule (180 mg total) by mouth daily., Disp: 90 capsule, Rfl: 3   diltiazem  (CARDIZEM ) 60 MG tablet, Take 1 tablet (60 mg total) by mouth as needed., Disp: 90 tablet, Rfl: 3   fluticasone  (FLONASE ) 50 MCG/ACT nasal spray, Place 2 sprays into both nostrils daily., Disp: 48 g, Rfl: 3   mirabegron  ER (MYRBETRIQ ) 25 MG TB24 tablet, Take 1 tablet (25 mg total) by mouth daily. (Patient not taking: Reported on 06/24/2024), Disp: 90 tablet, Rfl: 3   nitroGLYCERIN  (NITROSTAT ) 0.4 MG SL tablet, Place 1 tablet (0.4 mg total) under the tongue every 5 (five) minutes as needed for chest pain., Disp: 25 tablet, Rfl: 3 [2]  Allergies Allergen Reactions   Levofloxacin  Other (See Comments)    Couldn't breathe, passed out   Lopid [Gemfibrozil] Other (See Comments)    Loose bowels   Sulfonamide Derivatives Hives   Sulfa Antibiotics Rash   "

## 2024-06-24 NOTE — Addendum Note (Signed)
 Addended by: JOLINDA NORENE HERO on: 06/24/2024 02:57 PM   Modules accepted: Orders

## 2024-06-24 NOTE — Telephone Encounter (Signed)
-----   Message from [place], [place] sent at 06/24/2024  2:54 PM EST ----- Please inform the patient that his cardiologist is agreeable to Wegovy  for treatment of his weight and coronary artery disease.  I discussed his care with our clinical pharmacist and because he is not considered obese it may be a bit more challenging to get it covered by insurance but I am going to go ahead and pursue it.  If it is not covered, we can still consider the out-of-pocket option where her insurance does not cover it and it starts at $199 per month.  Hope to be in touch soon with next steps

## 2024-06-24 NOTE — Telephone Encounter (Signed)
 I called and spoke with patient and gave him PCP message. Patient voiced understanding. Will wait to hear back with next steps once we know more.

## 2024-06-25 LAB — CMP14+EGFR
ALT: 27 IU/L (ref 0–44)
AST: 26 IU/L (ref 0–40)
Albumin: 4 g/dL (ref 3.8–4.8)
Alkaline Phosphatase: 74 IU/L (ref 47–123)
BUN/Creatinine Ratio: 19 (ref 10–24)
BUN: 16 mg/dL (ref 8–27)
Bilirubin Total: 0.8 mg/dL (ref 0.0–1.2)
CO2: 24 mmol/L (ref 20–29)
Calcium: 8.9 mg/dL (ref 8.6–10.2)
Chloride: 97 mmol/L (ref 96–106)
Creatinine, Ser: 0.83 mg/dL (ref 0.76–1.27)
Globulin, Total: 2.4 g/dL (ref 1.5–4.5)
Glucose: 90 mg/dL (ref 70–99)
Potassium: 4.4 mmol/L (ref 3.5–5.2)
Sodium: 137 mmol/L (ref 134–144)
Total Protein: 6.4 g/dL (ref 6.0–8.5)
eGFR: 90 mL/min/1.73

## 2024-06-25 LAB — CBC WITH DIFFERENTIAL/PLATELET
Basophils Absolute: 0.1 x10E3/uL (ref 0.0–0.2)
Basos: 1 %
EOS (ABSOLUTE): 0.2 x10E3/uL (ref 0.0–0.4)
Eos: 2 %
Hematocrit: 45.9 % (ref 37.5–51.0)
Hemoglobin: 15.1 g/dL (ref 13.0–17.7)
Immature Grans (Abs): 0.1 x10E3/uL (ref 0.0–0.1)
Immature Granulocytes: 1 %
Lymphocytes Absolute: 1.3 x10E3/uL (ref 0.7–3.1)
Lymphs: 14 %
MCH: 30.3 pg (ref 26.6–33.0)
MCHC: 32.9 g/dL (ref 31.5–35.7)
MCV: 92 fL (ref 79–97)
Monocytes Absolute: 0.8 x10E3/uL (ref 0.1–0.9)
Monocytes: 9 %
Neutrophils Absolute: 6.8 x10E3/uL (ref 1.4–7.0)
Neutrophils: 73 %
Platelets: 245 x10E3/uL (ref 150–450)
RBC: 4.98 x10E6/uL (ref 4.14–5.80)
RDW: 12.9 % (ref 11.6–15.4)
WBC: 9.3 x10E3/uL (ref 3.4–10.8)

## 2024-06-25 LAB — LIPID PANEL
Cholesterol, Total: 130 mg/dL (ref 100–199)
HDL: 45 mg/dL
LDL CALC COMMENT:: 2.9 ratio (ref 0.0–5.0)
LDL Chol Calc (NIH): 70 mg/dL (ref 0–99)
Triglycerides: 73 mg/dL (ref 0–149)
VLDL Cholesterol Cal: 15 mg/dL (ref 5–40)

## 2024-06-25 LAB — PSA: Prostate Specific Ag, Serum: 1.5 ng/mL (ref 0.0–4.0)

## 2024-06-25 LAB — MAGNESIUM: Magnesium: 1.9 mg/dL (ref 1.6–2.3)

## 2024-06-25 LAB — TSH: TSH: 2.96 u[IU]/mL (ref 0.450–4.500)

## 2024-06-27 ENCOUNTER — Ambulatory Visit: Payer: Self-pay | Admitting: Family Medicine

## 2024-07-03 ENCOUNTER — Telehealth: Payer: Self-pay

## 2024-07-03 ENCOUNTER — Other Ambulatory Visit (HOSPITAL_COMMUNITY): Payer: Self-pay

## 2024-07-03 NOTE — Telephone Encounter (Signed)
 Pharmacy Patient Advocate Encounter   Received notification from Onbase CMM KEY that prior authorization for Wegovy  0.25MG /0.5ML auto-injectors is required/requested.   Insurance verification completed.   The patient is insured through [facility].   Per test claim: PA required; PA submitted to above mentioned insurance via Latent Key/confirmation #/EOC [id number] Status is pending

## 2024-07-04 NOTE — Telephone Encounter (Signed)
 Please let patient know that his insurance will not cover this medication because he has not had an actual heart attack in the past

## 2024-07-04 NOTE — Telephone Encounter (Signed)
 Called left message to call back

## 2024-07-04 NOTE — Telephone Encounter (Signed)
 Pharmacy Patient Advocate Encounter  Received notification from [facility] MEDICARE that Prior Authorization for Wegovy  0.25MG /0.5ML auto-injectors  has been DENIED.  Full denial letter will be uploaded to the media tab. See denial reason below.   PA #/Case ID/Reference #: [id number]

## 2024-07-04 NOTE — Telephone Encounter (Signed)
 Pt called in requesting to speak to nurse.   Called CAL no answer. Please follow up with pt

## 2024-11-04 ENCOUNTER — Other Ambulatory Visit

## 2024-11-10 ENCOUNTER — Ambulatory Visit: Payer: Self-pay

## 2024-11-12 ENCOUNTER — Other Ambulatory Visit

## 2024-11-19 ENCOUNTER — Ambulatory Visit: Admitting: Adult Health

## 2024-12-22 ENCOUNTER — Ambulatory Visit: Admitting: Family Medicine

## 2025-06-26 ENCOUNTER — Encounter: Admitting: Family Medicine
